# Patient Record
Sex: Female | Born: 1965
Health system: Southern US, Community
[De-identification: ages and names within clinical notes are randomized; demographics above are authoritative.]

## PROBLEM LIST (undated history)

## (undated) DIAGNOSIS — J40 Bronchitis, not specified as acute or chronic: Secondary | ICD-10-CM

## (undated) DIAGNOSIS — I272 Pulmonary hypertension, unspecified: Secondary | ICD-10-CM

## (undated) DIAGNOSIS — I3139 Other pericardial effusion (noninflammatory): Secondary | ICD-10-CM

## (undated) DIAGNOSIS — I313 Pericardial effusion (noninflammatory): Secondary | ICD-10-CM

## (undated) HISTORY — DX: Pericardial effusion (noninflammatory): I31.3

## (undated) HISTORY — PX: EYE SURGERY: SHX253

## (undated) HISTORY — PX: ABDOMINAL HYSTERECTOMY: SHX81

## (undated) HISTORY — DX: Other pericardial effusion (noninflammatory): I31.39

## (undated) HISTORY — DX: Pulmonary hypertension, unspecified: I27.20

---

## 2001-08-01 ENCOUNTER — Encounter: Payer: Self-pay | Admitting: General Surgery

## 2001-08-01 ENCOUNTER — Observation Stay (HOSPITAL_COMMUNITY): Admission: EM | Admit: 2001-08-01 | Discharge: 2001-08-01 | Payer: Self-pay

## 2001-10-11 ENCOUNTER — Emergency Department (HOSPITAL_COMMUNITY): Admission: EM | Admit: 2001-10-11 | Discharge: 2001-10-11 | Payer: Self-pay | Admitting: Emergency Medicine

## 2004-11-29 ENCOUNTER — Inpatient Hospital Stay (HOSPITAL_COMMUNITY): Admission: AD | Admit: 2004-11-29 | Discharge: 2004-12-08 | Payer: Self-pay | Admitting: Obstetrics and Gynecology

## 2004-11-29 ENCOUNTER — Encounter: Payer: Self-pay | Admitting: Emergency Medicine

## 2004-11-29 ENCOUNTER — Ambulatory Visit: Payer: Self-pay | Admitting: Infectious Diseases

## 2004-11-30 ENCOUNTER — Encounter (INDEPENDENT_AMBULATORY_CARE_PROVIDER_SITE_OTHER): Payer: Self-pay | Admitting: Specialist

## 2004-12-04 ENCOUNTER — Encounter (INDEPENDENT_AMBULATORY_CARE_PROVIDER_SITE_OTHER): Payer: Self-pay | Admitting: *Deleted

## 2005-11-13 ENCOUNTER — Emergency Department (HOSPITAL_COMMUNITY): Admission: EM | Admit: 2005-11-13 | Discharge: 2005-11-13 | Payer: Self-pay | Admitting: Emergency Medicine

## 2006-07-30 ENCOUNTER — Emergency Department (HOSPITAL_COMMUNITY): Admission: EM | Admit: 2006-07-30 | Discharge: 2006-07-30 | Payer: Self-pay | Admitting: Emergency Medicine

## 2007-02-13 ENCOUNTER — Inpatient Hospital Stay (HOSPITAL_COMMUNITY): Admission: AD | Admit: 2007-02-13 | Discharge: 2007-02-13 | Payer: Self-pay | Admitting: Gynecology

## 2008-03-21 ENCOUNTER — Ambulatory Visit: Payer: Self-pay | Admitting: Critical Care Medicine

## 2008-03-21 ENCOUNTER — Inpatient Hospital Stay (HOSPITAL_COMMUNITY): Admission: EM | Admit: 2008-03-21 | Discharge: 2008-03-27 | Payer: Self-pay | Admitting: Emergency Medicine

## 2008-03-21 ENCOUNTER — Ambulatory Visit: Payer: Self-pay | Admitting: Cardiology

## 2008-03-23 ENCOUNTER — Encounter: Payer: Self-pay | Admitting: Internal Medicine

## 2008-04-05 ENCOUNTER — Emergency Department (HOSPITAL_COMMUNITY): Admission: EM | Admit: 2008-04-05 | Discharge: 2008-04-05 | Payer: Self-pay | Admitting: Emergency Medicine

## 2008-04-12 ENCOUNTER — Emergency Department (HOSPITAL_COMMUNITY): Admission: EM | Admit: 2008-04-12 | Discharge: 2008-04-12 | Payer: Self-pay | Admitting: Family Medicine

## 2008-05-23 ENCOUNTER — Emergency Department (HOSPITAL_COMMUNITY): Admission: EM | Admit: 2008-05-23 | Discharge: 2008-05-24 | Payer: Self-pay | Admitting: Emergency Medicine

## 2008-06-13 ENCOUNTER — Emergency Department (HOSPITAL_COMMUNITY): Admission: EM | Admit: 2008-06-13 | Discharge: 2008-06-13 | Payer: Self-pay | Admitting: Family Medicine

## 2010-04-18 ENCOUNTER — Emergency Department (HOSPITAL_COMMUNITY): Admission: EM | Admit: 2010-04-18 | Discharge: 2010-04-19 | Payer: Self-pay | Admitting: Emergency Medicine

## 2010-06-23 ENCOUNTER — Emergency Department (HOSPITAL_COMMUNITY): Admission: EM | Admit: 2010-06-23 | Discharge: 2010-06-23 | Payer: Self-pay | Admitting: Emergency Medicine

## 2010-09-10 ENCOUNTER — Inpatient Hospital Stay (HOSPITAL_COMMUNITY)
Admission: EM | Admit: 2010-09-10 | Discharge: 2010-10-13 | Payer: Self-pay | Source: Home / Self Care | Attending: Pulmonary Disease | Admitting: Pulmonary Disease

## 2010-09-10 ENCOUNTER — Ambulatory Visit: Payer: Self-pay | Admitting: Pulmonary Disease

## 2010-09-29 ENCOUNTER — Encounter: Payer: Self-pay | Admitting: Pulmonary Disease

## 2010-10-23 ENCOUNTER — Ambulatory Visit: Payer: Self-pay | Admitting: Adult Health

## 2010-10-23 DIAGNOSIS — J449 Chronic obstructive pulmonary disease, unspecified: Secondary | ICD-10-CM

## 2010-10-23 DIAGNOSIS — R404 Transient alteration of awareness: Secondary | ICD-10-CM

## 2010-10-23 DIAGNOSIS — J9600 Acute respiratory failure, unspecified whether with hypoxia or hypercapnia: Secondary | ICD-10-CM

## 2010-10-23 DIAGNOSIS — E46 Unspecified protein-calorie malnutrition: Secondary | ICD-10-CM | POA: Insufficient documentation

## 2010-10-23 DIAGNOSIS — J441 Chronic obstructive pulmonary disease with (acute) exacerbation: Secondary | ICD-10-CM | POA: Insufficient documentation

## 2010-10-23 DIAGNOSIS — D649 Anemia, unspecified: Secondary | ICD-10-CM | POA: Insufficient documentation

## 2010-10-23 DIAGNOSIS — J189 Pneumonia, unspecified organism: Secondary | ICD-10-CM | POA: Insufficient documentation

## 2010-10-23 DIAGNOSIS — L8990 Pressure ulcer of unspecified site, unspecified stage: Secondary | ICD-10-CM | POA: Insufficient documentation

## 2010-11-06 ENCOUNTER — Ambulatory Visit
Admission: RE | Admit: 2010-11-06 | Discharge: 2010-11-06 | Payer: Self-pay | Source: Home / Self Care | Attending: Internal Medicine | Admitting: Internal Medicine

## 2010-11-06 ENCOUNTER — Ambulatory Visit: Admission: RE | Admit: 2010-11-06 | Discharge: 2010-11-06 | Payer: Self-pay | Source: Home / Self Care

## 2010-11-27 ENCOUNTER — Ambulatory Visit: Admit: 2010-11-27 | Payer: Self-pay | Admitting: Internal Medicine

## 2010-11-27 ENCOUNTER — Ambulatory Visit (INDEPENDENT_AMBULATORY_CARE_PROVIDER_SITE_OTHER): Payer: Self-pay | Admitting: Internal Medicine

## 2010-11-27 ENCOUNTER — Encounter (INDEPENDENT_AMBULATORY_CARE_PROVIDER_SITE_OTHER): Payer: Self-pay

## 2010-11-27 ENCOUNTER — Encounter: Payer: Self-pay | Admitting: Internal Medicine

## 2010-11-27 DIAGNOSIS — J9600 Acute respiratory failure, unspecified whether with hypoxia or hypercapnia: Secondary | ICD-10-CM

## 2010-11-27 DIAGNOSIS — R635 Abnormal weight gain: Secondary | ICD-10-CM

## 2010-11-27 DIAGNOSIS — J449 Chronic obstructive pulmonary disease, unspecified: Secondary | ICD-10-CM

## 2010-11-27 DIAGNOSIS — J189 Pneumonia, unspecified organism: Secondary | ICD-10-CM

## 2010-11-27 NOTE — Assessment & Plan Note (Signed)
Summary: NP follow up - post hosp   CC:  post hosp follow up - states breathing is doing well.  no new complaints..  History of Present Illness: 45 yo female  with initial CCM admission on  11/16/2011for Acute respiratory failure with subsequent  tracheostomy. Admitted with respiratory distress , active smoker and cocaine use , positive hx of ETOH  2010/11/29 --Pt presents for a  post hospital visit. Admitted  09/10/2010--  10/13/2010 for Acute respiratory failure with tracheostomy and chronic obstructive  pulmonary disease., Agitation and severe delirium,  Pneumonia, ventilator-associated.  Admitted in respiratory distress , active smoker and cocaine use , positive hx of ETOH  She did require tracheostomy 12/6-12/17  She was tx w/ IV abx, and aggressive pulmonary hygiene.She did have severe Agitation and  delirium most likely secondary to drug withdrawal syndrome.  Her agitation was improved on addition  Seroquel  and clorazepate.   She has tapered off klonopin and remains on seroquel -which she believes is helping with her nerves/sleep. She says she has smoked only a few cigs since discharge and none for 2 weeks and no drugs  since discharge.   She is feeling much better since discharge and trach site is healed up. No cough or dypsnea.Denies chest pain, orthopnea, hemoptysis, fever, n/v/d, edema, headache.     Medications Prior to Update: 1)  Lorazepam 1 Mg Tabs (Lorazepam) .... Take 1 Tab By Mouth At Bedtime 2)  Seroquel 100 Mg Tabs (Quetiapine Fumarate) .... Take 1 Tab By Mouth At Bedtime  Current Medications (verified): 1)  Lorazepam 1 Mg Tabs (Lorazepam) .... Take 1 Tab By Mouth At Bedtime 2)  Seroquel 100 Mg Tabs (Quetiapine Fumarate) .... Take 1 Tab By Mouth At Bedtime  Allergies (verified): No Known Drug Allergies  Past History:  Past Surgical History: Last updated: 10/23/2010 hysterectomy  Family History: Last updated: 2010/11/29 asthma - brother (deceased) heart disease -  father w/ HTN, PGF, brother passed from AMI cancer - father (prostate) DM - father Alzheimer's - PGM multplie sclerosis - mother  Social History: Last updated: 2010-11-29 + cocaine abuse positive smoker - x39yrs, 1ppd The patient lives with her dad.    Drinks 1 beer per day. single 1 child disabled declines flu shot Nov 29, 2010  Past Medical History:  ANEMIA (ICD-285.9) MALNUTRITION (ICD-263.9) PRESSURE ULCER UNSPECIFIED SITE (ICD-707.00) PNEUMONIA (ICD-486) DELIRIUM (ICD-780.09) COPD (ICD-496) RESPIRATORY FAILURE, ACUTE (ICD-518.81) Hx of Cocaine /ETOH use     Family History: asthma - brother (deceased) heart disease - father w/ HTN, PGF, brother passed from AMI cancer - father (prostate) DM - father Alzheimer's - PGM multplie sclerosis - mother  Social History: + cocaine abuse positive smoker - x24yrs, 1ppd The patient lives with her dad.    Drinks 1 beer per day. single 1 child disabled declines flu shot Nov 29, 2010  Review of Systems      See HPI  Vital Signs:  Patient profile:   45 year old female Height:      62 inches Weight:      185 pounds BMI:     33.96 O2 Sat:      98 % on Room air Temp:     97.0 degrees F oral Pulse rate:   100 / minute BP sitting:   138 / 76  (left arm) Cuff size:   regular  Vitals Entered By: Boone Master CNA/MA (29-Nov-2010 11:20 AM)  O2 Flow:  Room air CC: post hosp follow up - states breathing is  doing well.  no new complaints. Is Patient Diabetic? No Comments Medications reviewed with patient Daytime contact number verified with patient. Boone Master CNA/MA  November 06, 2010 11:20 AM    Physical Exam  Additional Exam:  GEN: A/Ox3; pleasant , NAD HEENT:  [place]/AT, , EACs-clear, TMs-wnl, NOSE-clear, THROAT-clear NECK:  Supple w/ fair ROM; no JVD; normal carotid impulses w/o bruits; no thyromegaly or nodules palpated; no lymphadenopathy., trach site is healed  RESP  Clear to P & A; w/o, wheezes/ rales/ or  rhonchi. CARD:  RRR, no m/r/g   GI:   Soft & nt; nml bowel sounds; no organomegaly or masses detected. [place]co: Warm bil,  no calf tenderness edema, clubbing, pulses intact Neuro: intact w/ no focal deficits noted.    Impression & Recommendations:  Problem # 1:  RESPIRATORY FAILURE, ACUTE (ICD-518.81)  Recent prolonged hospitalization for  Acute respiratory failure with subsequent  tracheostomy. Admitted with respiratory distress , active smoker and cocaine use , positive hx of ETOH She is much improved, trach site is healed.  xray shows improvement w/resolution of PNA Plan:  follow up for PFT and follow up with Dr. Sherene Sires in 3-4 weeks  Please contact office for sooner follow up if symptoms do not improve or worsen   Orders: Est. Patient Level IV (04540)  Problem # 2:  DELIRIUM (ICD-780.09)  Hx of delirium associated with critical illness and drug withdrawl syndrome.  she is much improved , has tapered off klonopin have suggested decreasing seroquel , however she would like to stay oncurrent dose for now until ov back in office Plan  refill x 1 month only then reevauate   Orders: Est. Patient Level IV (98119)  Other Orders: T-2 View CXR (71020TC)  Patient Instructions: 1)  follow up for PFT and follow up with Dr. Sherene Sires in 3-4 weeks  2)  Xray today ,I will call with results.  3)  May use Seroquel at bedtime  4)  Please contact office for sooner follow up if symptoms do not improve or worsen  Prescriptions: SEROQUEL 100 MG TABS (QUETIAPINE FUMARATE) Take 1 tab by mouth at bedtime  #30 x 0   Entered and Authorized by:   Rubye Oaks NP   Signed by:   Rubye Oaks NP on 11/06/2010   Method used:   Electronically to        [facility] [phone]* (retail)       [address]       [place], [place]  [address]       Ph: [phone]       Fax: [phone]   RxID:   [id number]

## 2010-12-02 ENCOUNTER — Other Ambulatory Visit: Payer: Self-pay

## 2010-12-03 NOTE — Assessment & Plan Note (Signed)
Summary: Pulmonary/ summary f/u ov   Vital Signs:  Patient profile:   45 year old female Weight:      180 pounds O2 Sat:      94 % on Room air Temp:     98.0 degrees F oral Pulse rate:   108 / minute BP sitting:   124 / 82  (left arm)  Vitals Entered By: Vernie Murders (November 27, 2010 11:04 AM)  O2 Flow:  Room air  Primary Provider/Referring Provider:  none  CC:  Dyspnea- the same.  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  Admitted with respiratory distress, active smoker and cocaine use , positive hx of ETOH  11/06/10 --Pt presents for a  post hospital visit. Admitted  09/10/2010--  10/13/2010 for Acute respiratory failure with tracheostomy and chronic obstructive  pulmonary disease., Agitation and severe delirium,  Pneumonia, ventilator-associated.  Admitted in respiratory distress , active smoker and cocaine use , positive hx of ETOH  She did require tracheostomy 12/6-12/17  She was tx w/ IV abx, and aggressive pulmonary hygiene.She did have severe Agitation and  delirium most likely secondary to drug withdrawal syndrome.  Her agitation was improved on addition  Seroquel  and clorazepate.   She has tapered off klonopin and remains on seroquel -which she believes is helping with her nerves/sleep. She says she has smoked only a few cigs since discharge and none for 2 weeks and no drugs  since discharge.     November 27, 2010 ov cc sob  still smoking some but gets across parking lot ok,  one landing ok, sleeping ok. really afraid to push it harder at this point.  Pt denies any significant sore throat, dysphagia, itching, sneezing,  nasal congestion or excess secretions,  fever, chills, sweats, unintended wt loss, pleuritic or exertional cp, hempoptysis, change in activity tolerance  orthopnea pnd or leg swelling   denies any cocaine or alcohol use   Current  Medications (verified): 1)  Lorazepam 1 Mg Tabs (Lorazepam) .... Take 1 Tab By Mouth At [facility]  Allergies (verified): No Known Drug Allergies  Past History:  Past Medical History: ANEMIA (ICD-285.9) MALNUTRITION (ICD-263.9) PRESSURE ULCER UNSPECIFIED SITE (ICD-707.00) PNEUMONIA (ICD-486) DELIRIUM (ICD-780.09)  RESPIRATORY FAILURE, ACUTE (ICD-518.81)     - Probably related to  cocaine inhalation     - PFT's November 27, 2010 FEV1  1.99 (78%) ratio 77 and DLC0 50 > corrects to 77 Hx of Cocaine /ETOH use      Impression & Recommendations:  Problem # 1:  RESPIRATORY FAILURE, ACUTE (ICD-518.81)  Resolved with excellent baseline function  Orders: Est. Patient Level II (04540)  Problem # 2:  WEIGHT GAIN, ABNORMAL (ICD-783.1)  Weight control is a matter of calorie balance which needs to be tilted in the pt's favor by eating less and exercising more.  Specifically, I recommended  exercise at a level where pt  is short of breath but not out of breath 30 minutes daily.  If not losing weight on this program, I would strongly recommend pt see a nutritionist with a food diary recorded for two weeks prior to the visit.     Orders: Est. Patient Level II (98119)  Medications Added to Medication List This Visit: 1)  Lorazepam 1 Mg Tabs (Lorazepam) .... Take 1 tab by mouth at bedtime-ran out  Patient Instructions: 1)  Resume activities  avoid heavy exposure to extremes  of temperature or smoke or fumes.  2)  Weight control is simply a matter of calorie balance which needs to be tilted in your favor by eating less and exercising more.  To get the most out of exercise, you need to be continuously aware that you are short of breath, but never out of breath, for 30 minutes daily. As you improve, it will actually be easier for you to do the same amount in  30 minutes so always push to the level where you are short of breath.  If this does not result in gradual weight reduction,  I recommend   a nutritionist for a food diary   Orders Added: 1)  Est. Patient Level II [04540]

## 2010-12-03 NOTE — Assessment & Plan Note (Signed)
Summary: pft w/ rov--pft charges//jwr   Allergies: No Known Drug Allergies   Other Orders: Carbon Monoxide diffusing w/capacity (16109) Lung Volumes/Gas dilution or washout (60454) Spirometry (Pre & Post) (09811)   Orders Added: 1)  Carbon Monoxide diffusing w/capacity [94729] 2)  Lung Volumes/Gas dilution or washout [94727] 3)  Spirometry (Pre & Post) [94060]

## 2010-12-24 NOTE — Op Note (Signed)
Michele Parrish, Michele Parrish              ACCOUNT NO.:  [id number]  MEDICAL RECORD NO.:  [id number]          PATIENT TYPE:  INP  LOCATION:  1228                         FACILITY:  [facility]  PHYSICIAN:  Kalman Shan, MD   DATE OF BIRTH:  Apr 06, 1966  DATE OF PROCEDURE: DATE OF DISCHARGE:                              OPERATIVE REPORT   TYPE OF PROCEDURE:  Percutaneous dilatational tracheostomy with fiberoptic bronchoscopy.  SURGEONS: 1. Kalman Shan, MD. 2. Mcarthur Rossetti. Tyson Alias, MD, will be the bronchoscopist and the proctor     for the percutaneous dilatational tracheostomy.  CONSENT:  Informed consent with the risks and benefits explained to the father who was the closest of kin obtained on the day prior to procedure, which was September 24, 2010.  INDICATIONS FOR PROCEDURE: 1. Acute-on-chronic respiratory failure, failure to wean from     mechanical ventilator. 2. Failure to wean from mechanical ventilator. 3. Prolonged mechanical ventilation. 4. Intensive care unit delirium. 5. Dependence on respiratory status.  RISKS OF PROCEDURE:  Including bleeding, infection, tracheal decannulation, hemorrhage, tracheal stenosis, recurrent bronchitis, and tracheomalacia and complications with sedation all explained to the father, and he agreed to proceed.  SITE OF PROCEDURE:  [facility].  PREPROCEDURE ASSESSMENT:  [name] with acute-on-chronic respiratory failure, dependence on respiratory status, failure to wean from mechanical ventilator secondary to pneumonia and also delirium. She is almost 2 weeks on the ventilator, and therefore it was felt tracheostomy was indicated.  Prior to procedure, normal vital signs and blood pressure and physical exam, which was acceptable.  Chemistry labs, hematology labs, and coagulation profile was confirmed and it was decided it was safe to undergo procedure.  SEDATION PLANNED:  Fentanyl and Versed followed by  etomidate with vecuronium.  DESCRIPTION OF PROCEDURE:  Prior to procedure, the patient was given FIO2 100% for at least an hour before the procedure.  The patient was initially sedated with fentanyl and Versed.  A blanket roll was placed behind her shoulders to extend the neck.  Following this, the landmarks of the thyroid cartilage, cricoid cartilage, and tracheal rings in the suprasternal notch were all identified and marked.  Then, after a full body drape by the operator, her neck was cleaned with chlorhexidine x3 scrubs.  Following this, topical lidocaine anesthesia was given in the area of the tracheal rings.  At this point, etomidate was infused to obtain general anesthesia.  Following this, furthermore, fentanyl and Versed was given and vecuronium was given.  Blood pressure remained normal intitially, but the mean arterial pressure did drop to 60 and 1 saline bolus was given.  Then, after ensuring adequate analgesia and anesthesia, a longitudinal skin incision 2 cm long was made in the center between the suprasternal notch and the cricoid cartilage.  The subcutaneous plane was dissected.  The cricoid cartilage and the thyroid cartilages were palpated for landmarks, and so were the tracheal rings.  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 At this point, the Angiocath needle was carefully introduced and we penetrated the space between the 2nd and 3rd tracheal rings and the Angiocath then penetrated the tracheal airspace.  This was noticed with return of air bubbles and also with the bronchoscopist confirming the presence of the Angiocath.  Extreme care was taken to ensure that the posterior membranous wall was not penetrated with the Angiocath  and following this, the guidewire was introduced and following this, the punch dilator was introduced with dilatation of the tracheal ring. After this, the punch dilator was removed and the Blue Rhino dilator under lubrication was used to further dilate the space between the tracheal rings.  The tracheostomy was then taken and with 26 French dilator inside was snapped into place.  Following this, the white inner sheath and the dilator were removed.  The tracheostomy was kept in place and the inner cannula was snapped in place.  After this, the bronchoscopist removed the bronchoscope from the endotracheal tube and passed through the tracheostomy in excellent position and no bleeding was ensured.  After this, the bronchoscope was withdrawn and the tracheostomy was sutured and a Velcro strap was applied.  COMPLICATIONS:  None.  POSTPROCEDURE PLAN:  Await chest x-ray, postprocedure tracheostomy order set filled out.  SEDATION PLAN:  Will be written out, but will discontinue the Precedex.     Kalman Shan, MD     MR/MEDQ  D:  09/25/2010  T:  09/25/2010  Job:  [id number]  Electronically Signed by Kalman Shan MD on 12/24/2010 11:01:45 AM

## 2011-01-05 LAB — BASIC METABOLIC PANEL
BUN: 17 mg/dL (ref 6–23)
BUN: 11 mg/dL (ref 6–23)
BUN: 13 mg/dL (ref 6–23)
BUN: 15 mg/dL (ref 6–23)
BUN: 17 mg/dL (ref 6–23)
BUN: 19 mg/dL (ref 6–23)
BUN: 7 mg/dL (ref 6–23)
BUN: 8 mg/dL (ref 6–23)
CO2: 30 mEq/L (ref 19–32)
CO2: 24 mEq/L (ref 19–32)
CO2: 25 mEq/L (ref 19–32)
CO2: 25 mEq/L (ref 19–32)
CO2: 27 mEq/L (ref 19–32)
CO2: 30 mEq/L (ref 19–32)
CO2: 31 mEq/L (ref 19–32)
CO2: 31 mEq/L (ref 19–32)
CO2: 33 mEq/L — ABNORMAL HIGH (ref 19–32)
Calcium: 8.6 mg/dL (ref 8.4–10.5)
Calcium: 9 mg/dL (ref 8.4–10.5)
Calcium: 9 mg/dL (ref 8.4–10.5)
Calcium: 9 mg/dL (ref 8.4–10.5)
Calcium: 9.2 mg/dL (ref 8.4–10.5)
Calcium: 9.2 mg/dL (ref 8.4–10.5)
Chloride: 105 mEq/L (ref 96–112)
Chloride: 100 mEq/L (ref 96–112)
Chloride: 101 mEq/L (ref 96–112)
Chloride: 101 mEq/L (ref 96–112)
Chloride: 104 mEq/L (ref 96–112)
Chloride: 109 mEq/L (ref 96–112)
Chloride: 109 mEq/L (ref 96–112)
Chloride: 110 mEq/L (ref 96–112)
Creatinine, Ser: 0.54 mg/dL (ref 0.4–1.2)
Creatinine, Ser: 0.73 mg/dL (ref 0.4–1.2)
Creatinine, Ser: 0.77 mg/dL (ref 0.4–1.2)
Creatinine, Ser: 0.8 mg/dL (ref 0.4–1.2)
Creatinine, Ser: 0.81 mg/dL (ref 0.4–1.2)
Creatinine, Ser: 0.88 mg/dL (ref 0.4–1.2)
GFR calc Af Amer: >60 mL/min (ref >60)
GFR calc Af Amer: >60 mL/min (ref >60)
GFR calc Af Amer: >60 mL/min (ref >60)
GFR calc Af Amer: >60 mL/min (ref >60)
GFR calc non Af Amer: >60 mL/min (ref >60)
GFR calc non Af Amer: >60 mL/min (ref >60)
GFR calc non Af Amer: >60 mL/min (ref >60)
GFR calc non Af Amer: >60 mL/min (ref >60)
GFR calc non Af Amer: >60 mL/min (ref >60)
GFR calc non Af Amer: >60 mL/min (ref >60)
GFR calc non Af Amer: >60 mL/min (ref >60)
Glucose, Bld: 111 mg/dL — ABNORMAL HIGH (ref 70–99)
Glucose, Bld: 118 mg/dL — ABNORMAL HIGH (ref 70–99)
Glucose, Bld: 119 mg/dL — ABNORMAL HIGH (ref 70–99)
Glucose, Bld: 128 mg/dL — ABNORMAL HIGH (ref 70–99)
Glucose, Bld: 149 mg/dL — ABNORMAL HIGH (ref 70–99)
Glucose, Bld: 161 mg/dL — ABNORMAL HIGH (ref 70–99)
Glucose, Bld: 98 mg/dL (ref 70–99)
Potassium: 4.1 mEq/L (ref 3.5–5.1)
Potassium: 3.6 mEq/L (ref 3.5–5.1)
Potassium: 4 mEq/L (ref 3.5–5.1)
Potassium: 4.1 mEq/L (ref 3.5–5.1)
Potassium: 4.3 mEq/L (ref 3.5–5.1)
Potassium: 4.6 mEq/L (ref 3.5–5.1)
Sodium: 146 mEq/L — ABNORMAL HIGH (ref 135–145)
Sodium: 139 mEq/L (ref 135–145)
Sodium: 140 mEq/L (ref 135–145)
Sodium: 140 mEq/L (ref 135–145)
Sodium: 140 mEq/L (ref 135–145)
Sodium: 142 mEq/L (ref 135–145)
Sodium: 143 mEq/L (ref 135–145)
Sodium: 143 mEq/L (ref 135–145)

## 2011-01-05 LAB — CBC
HCT: 32.7 % — ABNORMAL LOW (ref 36.0–46.0)
HCT: 27 % — ABNORMAL LOW (ref 36.0–46.0)
HCT: 27.1 % — ABNORMAL LOW (ref 36.0–46.0)
HCT: 28.5 % — ABNORMAL LOW (ref 36.0–46.0)
HCT: 29.2 % — ABNORMAL LOW (ref 36.0–46.0)
HCT: 30.3 % — ABNORMAL LOW (ref 36.0–46.0)
HCT: 31.2 % — ABNORMAL LOW (ref 36.0–46.0)
HCT: 31.2 % — ABNORMAL LOW (ref 36.0–46.0)
HCT: 32 % — ABNORMAL LOW (ref 36.0–46.0)
HCT: 32.1 % — ABNORMAL LOW (ref 36.0–46.0)
Hemoglobin: 10 g/dL — ABNORMAL LOW (ref 12.0–15.0)
Hemoglobin: 10.1 g/dL — ABNORMAL LOW (ref 12.0–15.0)
Hemoglobin: 10.1 g/dL — ABNORMAL LOW (ref 12.0–15.0)
Hemoglobin: 10.4 g/dL — ABNORMAL LOW (ref 12.0–15.0)
Hemoglobin: 9.3 g/dL — ABNORMAL LOW (ref 12.0–15.0)
Hemoglobin: 9.7 g/dL — ABNORMAL LOW (ref 12.0–15.0)
Hemoglobin: 9.8 g/dL — ABNORMAL LOW (ref 12.0–15.0)
MCH: 27.2 pg (ref 26.0–34.0)
MCH: 27.2 pg (ref 26.0–34.0)
MCH: 27.2 pg (ref 26.0–34.0)
MCH: 27.5 pg (ref 26.0–34.0)
MCH: 27.5 pg (ref 26.0–34.0)
MCH: 27.6 pg (ref 26.0–34.0)
MCH: 27.7 pg (ref 26.0–34.0)
MCHC: 31.4 g/dL (ref 30.0–36.0)
MCHC: 32.1 g/dL (ref 30.0–36.0)
MCHC: 32.6 g/dL (ref 30.0–36.0)
MCHC: 32.8 g/dL (ref 30.0–36.0)
MCV: 83.3 fL (ref 78.0–100.0)
MCV: 84.2 fL (ref 78.0–100.0)
MCV: 84.2 fL (ref 78.0–100.0)
MCV: 84.4 fL (ref 78.0–100.0)
MCV: 84.4 fL (ref 78.0–100.0)
MCV: 86.7 fL (ref 78.0–100.0)
MCV: 86.8 fL (ref 78.0–100.0)
MCV: 87 fL (ref 78.0–100.0)
MCV: 87.2 fL (ref 78.0–100.0)
Platelets: 118 10*3/uL — ABNORMAL LOW (ref 150–400)
Platelets: 245 10*3/uL (ref 150–400)
Platelets: 276 10*3/uL (ref 150–400)
Platelets: 283 10*3/uL (ref 150–400)
Platelets: 283 10*3/uL (ref 150–400)
Platelets: 291 10*3/uL (ref 150–400)
RBC: 3.81 MIL/uL — ABNORMAL LOW (ref 3.87–5.11)
RBC: 3.42 MIL/uL — ABNORMAL LOW (ref 3.87–5.11)
RBC: 3.46 MIL/uL — ABNORMAL LOW (ref 3.87–5.11)
RBC: 3.6 MIL/uL — ABNORMAL LOW (ref 3.87–5.11)
RBC: 3.68 MIL/uL — ABNORMAL LOW (ref 3.87–5.11)
RBC: 3.71 MIL/uL — ABNORMAL LOW (ref 3.87–5.11)
RBC: 3.72 MIL/uL — ABNORMAL LOW (ref 3.87–5.11)
RBC: 3.76 MIL/uL — ABNORMAL LOW (ref 3.87–5.11)
RDW: 14.4 % (ref 11.5–15.5)
RDW: 13.8 % (ref 11.5–15.5)
RDW: 13.8 % (ref 11.5–15.5)
RDW: 14 % (ref 11.5–15.5)
RDW: 14.3 % (ref 11.5–15.5)
RDW: 14.3 % (ref 11.5–15.5)
RDW: 14.3 % (ref 11.5–15.5)
WBC: 6.4 10*3/uL (ref 4.0–10.5)
WBC: 10.5 10*3/uL (ref 4.0–10.5)
WBC: 11.2 10*3/uL — ABNORMAL HIGH (ref 4.0–10.5)
WBC: 13.9 10*3/uL — ABNORMAL HIGH (ref 4.0–10.5)
WBC: 16 10*3/uL — ABNORMAL HIGH (ref 4.0–10.5)
WBC: 7.1 10*3/uL (ref 4.0–10.5)
WBC: 7.8 10*3/uL (ref 4.0–10.5)
WBC: 8.3 10*3/uL (ref 4.0–10.5)
WBC: 8.5 10*3/uL (ref 4.0–10.5)
WBC: 9.4 10*3/uL (ref 4.0–10.5)

## 2011-01-05 LAB — COMPREHENSIVE METABOLIC PANEL
ALT: 15 U/L (ref 0–35)
ALT: 17 U/L (ref 0–35)
ALT: 22 U/L (ref 0–35)
AST: 21 U/L (ref 0–37)
AST: 25 U/L (ref 0–37)
Albumin: 2.8 g/dL — ABNORMAL LOW (ref 3.5–5.2)
Albumin: 2.6 g/dL — ABNORMAL LOW (ref 3.5–5.2)
Albumin: 2.6 g/dL — ABNORMAL LOW (ref 3.5–5.2)
Albumin: 2.7 g/dL — ABNORMAL LOW (ref 3.5–5.2)
Alkaline Phosphatase: 48 U/L (ref 39–117)
Alkaline Phosphatase: 46 U/L (ref 39–117)
Alkaline Phosphatase: 47 U/L (ref 39–117)
Alkaline Phosphatase: 47 U/L (ref 39–117)
Alkaline Phosphatase: 47 U/L (ref 39–117)
BUN: 15 mg/dL (ref 6–23)
BUN: 18 mg/dL (ref 6–23)
BUN: 20 mg/dL (ref 6–23)
CO2: 27 mEq/L (ref 19–32)
CO2: 28 mEq/L (ref 19–32)
Calcium: 8.9 mg/dL (ref 8.4–10.5)
Chloride: 102 mEq/L (ref 96–112)
Chloride: 103 mEq/L (ref 96–112)
Chloride: 105 mEq/L (ref 96–112)
Creatinine, Ser: 0.78 mg/dL (ref 0.4–1.2)
Creatinine, Ser: 0.84 mg/dL (ref 0.4–1.2)
Creatinine, Ser: 1 mg/dL (ref 0.4–1.2)
GFR calc Af Amer: >60 mL/min (ref >60)
GFR calc non Af Amer: >60 mL/min (ref >60)
GFR calc non Af Amer: >60 mL/min (ref >60)
Glucose, Bld: 100 mg/dL — ABNORMAL HIGH (ref 70–99)
Glucose, Bld: 121 mg/dL — ABNORMAL HIGH (ref 70–99)
Glucose, Bld: 122 mg/dL — ABNORMAL HIGH (ref 70–99)
Glucose, Bld: 123 mg/dL — ABNORMAL HIGH (ref 70–99)
Glucose, Bld: 135 mg/dL — ABNORMAL HIGH (ref 70–99)
Potassium: 3.4 mEq/L — ABNORMAL LOW (ref 3.5–5.1)
Potassium: 3.6 mEq/L (ref 3.5–5.1)
Potassium: 3.7 mEq/L (ref 3.5–5.1)
Potassium: 3.7 mEq/L (ref 3.5–5.1)
Potassium: 3.9 mEq/L (ref 3.5–5.1)
Sodium: 138 mEq/L (ref 135–145)
Sodium: 140 mEq/L (ref 135–145)
Sodium: 142 mEq/L (ref 135–145)
Sodium: 144 mEq/L (ref 135–145)
Total Bilirubin: 0.4 mg/dL (ref 0.3–1.2)
Total Bilirubin: 0.5 mg/dL (ref 0.3–1.2)
Total Bilirubin: 0.5 mg/dL (ref 0.3–1.2)
Total Protein: 7.5 g/dL (ref 6.0–8.3)
Total Protein: 7.8 g/dL (ref 6.0–8.3)
Total Protein: 8.8 g/dL — ABNORMAL HIGH (ref 6.0–8.3)

## 2011-01-05 LAB — URINALYSIS, ROUTINE W REFLEX MICROSCOPIC
Glucose, UA: NEGATIVE mg/dL
Protein, ur: 100 mg/dL — AB
Specific Gravity, Urine: 1.03 (ref 1.005–1.030)
pH: 5.5 (ref 5.0–8.0)

## 2011-01-05 LAB — GLUCOSE, CAPILLARY
Glucose-Capillary: 102 mg/dL — ABNORMAL HIGH (ref 70–99)
Glucose-Capillary: 103 mg/dL — ABNORMAL HIGH (ref 70–99)
Glucose-Capillary: 106 mg/dL — ABNORMAL HIGH (ref 70–99)
Glucose-Capillary: 111 mg/dL — ABNORMAL HIGH (ref 70–99)
Glucose-Capillary: 122 mg/dL — ABNORMAL HIGH (ref 70–99)
Glucose-Capillary: 128 mg/dL — ABNORMAL HIGH (ref 70–99)
Glucose-Capillary: 129 mg/dL — ABNORMAL HIGH (ref 70–99)
Glucose-Capillary: 130 mg/dL — ABNORMAL HIGH (ref 70–99)
Glucose-Capillary: 131 mg/dL — ABNORMAL HIGH (ref 70–99)
Glucose-Capillary: 133 mg/dL — ABNORMAL HIGH (ref 70–99)
Glucose-Capillary: 141 mg/dL — ABNORMAL HIGH (ref 70–99)
Glucose-Capillary: 88 mg/dL (ref 70–99)
Glucose-Capillary: 91 mg/dL (ref 70–99)
Glucose-Capillary: 96 mg/dL (ref 70–99)
Glucose-Capillary: 97 mg/dL (ref 70–99)
Glucose-Capillary: 98 mg/dL (ref 70–99)
Glucose-Capillary: 100 mg/dL — ABNORMAL HIGH (ref 70–99)
Glucose-Capillary: 101 mg/dL — ABNORMAL HIGH (ref 70–99)
Glucose-Capillary: 101 mg/dL — ABNORMAL HIGH (ref 70–99)
Glucose-Capillary: 102 mg/dL — ABNORMAL HIGH (ref 70–99)
Glucose-Capillary: 102 mg/dL — ABNORMAL HIGH (ref 70–99)
Glucose-Capillary: 104 mg/dL — ABNORMAL HIGH (ref 70–99)
Glucose-Capillary: 105 mg/dL — ABNORMAL HIGH (ref 70–99)
Glucose-Capillary: 108 mg/dL — ABNORMAL HIGH (ref 70–99)
Glucose-Capillary: 109 mg/dL — ABNORMAL HIGH (ref 70–99)
Glucose-Capillary: 110 mg/dL — ABNORMAL HIGH (ref 70–99)
Glucose-Capillary: 110 mg/dL — ABNORMAL HIGH (ref 70–99)
Glucose-Capillary: 111 mg/dL — ABNORMAL HIGH (ref 70–99)
Glucose-Capillary: 111 mg/dL — ABNORMAL HIGH (ref 70–99)
Glucose-Capillary: 111 mg/dL — ABNORMAL HIGH (ref 70–99)
Glucose-Capillary: 112 mg/dL — ABNORMAL HIGH (ref 70–99)
Glucose-Capillary: 114 mg/dL — ABNORMAL HIGH (ref 70–99)
Glucose-Capillary: 117 mg/dL — ABNORMAL HIGH (ref 70–99)
Glucose-Capillary: 117 mg/dL — ABNORMAL HIGH (ref 70–99)
Glucose-Capillary: 118 mg/dL — ABNORMAL HIGH (ref 70–99)
Glucose-Capillary: 119 mg/dL — ABNORMAL HIGH (ref 70–99)
Glucose-Capillary: 122 mg/dL — ABNORMAL HIGH (ref 70–99)
Glucose-Capillary: 122 mg/dL — ABNORMAL HIGH (ref 70–99)
Glucose-Capillary: 124 mg/dL — ABNORMAL HIGH (ref 70–99)
Glucose-Capillary: 124 mg/dL — ABNORMAL HIGH (ref 70–99)
Glucose-Capillary: 125 mg/dL — ABNORMAL HIGH (ref 70–99)
Glucose-Capillary: 128 mg/dL — ABNORMAL HIGH (ref 70–99)
Glucose-Capillary: 129 mg/dL — ABNORMAL HIGH (ref 70–99)
Glucose-Capillary: 130 mg/dL — ABNORMAL HIGH (ref 70–99)
Glucose-Capillary: 130 mg/dL — ABNORMAL HIGH (ref 70–99)
Glucose-Capillary: 130 mg/dL — ABNORMAL HIGH (ref 70–99)
Glucose-Capillary: 130 mg/dL — ABNORMAL HIGH (ref 70–99)
Glucose-Capillary: 134 mg/dL — ABNORMAL HIGH (ref 70–99)
Glucose-Capillary: 141 mg/dL — ABNORMAL HIGH (ref 70–99)
Glucose-Capillary: 143 mg/dL — ABNORMAL HIGH (ref 70–99)
Glucose-Capillary: 143 mg/dL — ABNORMAL HIGH (ref 70–99)
Glucose-Capillary: 144 mg/dL — ABNORMAL HIGH (ref 70–99)
Glucose-Capillary: 144 mg/dL — ABNORMAL HIGH (ref 70–99)
Glucose-Capillary: 146 mg/dL — ABNORMAL HIGH (ref 70–99)
Glucose-Capillary: 147 mg/dL — ABNORMAL HIGH (ref 70–99)
Glucose-Capillary: 157 mg/dL — ABNORMAL HIGH (ref 70–99)
Glucose-Capillary: 161 mg/dL — ABNORMAL HIGH (ref 70–99)
Glucose-Capillary: 164 mg/dL — ABNORMAL HIGH (ref 70–99)
Glucose-Capillary: 171 mg/dL — ABNORMAL HIGH (ref 70–99)
Glucose-Capillary: 91 mg/dL (ref 70–99)
Glucose-Capillary: 94 mg/dL (ref 70–99)
Glucose-Capillary: 98 mg/dL (ref 70–99)
Glucose-Capillary: 98 mg/dL (ref 70–99)
Glucose-Capillary: 99 mg/dL (ref 70–99)

## 2011-01-05 LAB — BLOOD GAS, ARTERIAL
Acid-Base Excess: 0.1 mmol/L (ref 0.0–2.0)
Bicarbonate: 24.1 mEq/L — ABNORMAL HIGH (ref 20.0–24.0)
Bicarbonate: 26.8 mEq/L — ABNORMAL HIGH (ref 20.0–24.0)
Drawn by: 295031
FIO2: 0.3 %
MECHVT: 450 mL
O2 Saturation: 87.9 %
Patient temperature: 98.6
TCO2: 21.9 mmol/L (ref 0–100)
TCO2: 24.8 mmol/L (ref 0–100)
pCO2 arterial: 38.8 mmHg (ref 35.0–45.0)
pCO2 arterial: 41.6 mmHg (ref 35.0–45.0)
pH, Arterial: 7.424 — ABNORMAL HIGH (ref 7.350–7.400)
pO2, Arterial: 53.6 mmHg — ABNORMAL LOW (ref 80.0–100.0)
pO2, Arterial: 59.9 mmHg — ABNORMAL LOW (ref 80.0–100.0)

## 2011-01-05 LAB — URINE CULTURE
Colony Count: NO GROWTH
Culture  Setup Time: 201112030053
Culture: NO GROWTH

## 2011-01-05 LAB — CULTURE, BLOOD (ROUTINE X 2)
Culture  Setup Time: 201112012339
Culture: NO GROWTH
Culture: NO GROWTH

## 2011-01-05 LAB — PROCALCITONIN: Procalcitonin: <0.1 ng/mL

## 2011-01-05 LAB — PROTIME-INR
INR: 1.1 (ref 0.00–1.49)
INR: 1.1 (ref 0.00–1.49)
Prothrombin Time: 14.4 seconds (ref 11.6–15.2)
Prothrombin Time: 14.4 seconds (ref 11.6–15.2)

## 2011-01-05 LAB — CULTURE, BAL-QUANTITATIVE

## 2011-01-05 LAB — MAGNESIUM
Magnesium: 2 mg/dL (ref 1.5–2.5)
Magnesium: 2.3 mg/dL (ref 1.5–2.5)

## 2011-01-05 LAB — URINE MICROSCOPIC-ADD ON

## 2011-01-05 LAB — BRAIN NATRIURETIC PEPTIDE: Pro B Natriuretic peptide (BNP): 65.4 pg/mL (ref 0.0–100.0)

## 2011-01-05 LAB — TRIGLYCERIDES
Triglycerides: 210 mg/dL — ABNORMAL HIGH (ref <150)
Triglycerides: 244 mg/dL — ABNORMAL HIGH (ref <150)
Triglycerides: 297 mg/dL — ABNORMAL HIGH (ref <150)

## 2011-01-05 LAB — CULTURE, BAL-QUANTITATIVE W GRAM STAIN: Colony Count: >=100000

## 2011-01-05 LAB — APTT: aPTT: 49 seconds — ABNORMAL HIGH (ref 24–37)

## 2011-01-05 LAB — PHOSPHORUS: Phosphorus: 5.6 mg/dL — ABNORMAL HIGH (ref 2.3–4.6)

## 2011-01-05 LAB — LACTIC ACID, PLASMA: Lactic Acid, Venous: 1.4 mmol/L (ref 0.5–2.2)

## 2011-01-06 LAB — CARDIAC PANEL(CRET KIN+CKTOT+MB+TROPI)
CK, MB: 5.8 ng/mL — ABNORMAL HIGH (ref 0.3–4.0)
CK, MB: 9.2 ng/mL (ref 0.3–4.0)
Relative Index: 0.9 (ref 0.0–2.5)
Relative Index: 0.9 (ref 0.0–2.5)
Total CK: 1487 U/L — ABNORMAL HIGH (ref 7–177)
Total CK: 834 U/L — ABNORMAL HIGH (ref 7–177)
Troponin I: 0.01 ng/mL (ref 0.00–0.06)
Troponin I: 0.02 ng/mL (ref 0.00–0.06)
Troponin I: 0.02 ng/mL (ref 0.00–0.06)

## 2011-01-06 LAB — GLUCOSE, CAPILLARY
Glucose-Capillary: 100 mg/dL — ABNORMAL HIGH (ref 70–99)
Glucose-Capillary: 101 mg/dL — ABNORMAL HIGH (ref 70–99)
Glucose-Capillary: 103 mg/dL — ABNORMAL HIGH (ref 70–99)
Glucose-Capillary: 104 mg/dL — ABNORMAL HIGH (ref 70–99)
Glucose-Capillary: 104 mg/dL — ABNORMAL HIGH (ref 70–99)
Glucose-Capillary: 105 mg/dL — ABNORMAL HIGH (ref 70–99)
Glucose-Capillary: 108 mg/dL — ABNORMAL HIGH (ref 70–99)
Glucose-Capillary: 110 mg/dL — ABNORMAL HIGH (ref 70–99)
Glucose-Capillary: 111 mg/dL — ABNORMAL HIGH (ref 70–99)
Glucose-Capillary: 111 mg/dL — ABNORMAL HIGH (ref 70–99)
Glucose-Capillary: 112 mg/dL — ABNORMAL HIGH (ref 70–99)
Glucose-Capillary: 112 mg/dL — ABNORMAL HIGH (ref 70–99)
Glucose-Capillary: 115 mg/dL — ABNORMAL HIGH (ref 70–99)
Glucose-Capillary: 116 mg/dL — ABNORMAL HIGH (ref 70–99)
Glucose-Capillary: 117 mg/dL — ABNORMAL HIGH (ref 70–99)
Glucose-Capillary: 117 mg/dL — ABNORMAL HIGH (ref 70–99)
Glucose-Capillary: 121 mg/dL — ABNORMAL HIGH (ref 70–99)
Glucose-Capillary: 126 mg/dL — ABNORMAL HIGH (ref 70–99)
Glucose-Capillary: 128 mg/dL — ABNORMAL HIGH (ref 70–99)
Glucose-Capillary: 132 mg/dL — ABNORMAL HIGH (ref 70–99)
Glucose-Capillary: 133 mg/dL — ABNORMAL HIGH (ref 70–99)
Glucose-Capillary: 136 mg/dL — ABNORMAL HIGH (ref 70–99)
Glucose-Capillary: 138 mg/dL — ABNORMAL HIGH (ref 70–99)
Glucose-Capillary: 139 mg/dL — ABNORMAL HIGH (ref 70–99)
Glucose-Capillary: 139 mg/dL — ABNORMAL HIGH (ref 70–99)
Glucose-Capillary: 139 mg/dL — ABNORMAL HIGH (ref 70–99)
Glucose-Capillary: 140 mg/dL — ABNORMAL HIGH (ref 70–99)
Glucose-Capillary: 140 mg/dL — ABNORMAL HIGH (ref 70–99)
Glucose-Capillary: 141 mg/dL — ABNORMAL HIGH (ref 70–99)
Glucose-Capillary: 142 mg/dL — ABNORMAL HIGH (ref 70–99)
Glucose-Capillary: 142 mg/dL — ABNORMAL HIGH (ref 70–99)
Glucose-Capillary: 143 mg/dL — ABNORMAL HIGH (ref 70–99)
Glucose-Capillary: 144 mg/dL — ABNORMAL HIGH (ref 70–99)
Glucose-Capillary: 150 mg/dL — ABNORMAL HIGH (ref 70–99)
Glucose-Capillary: 150 mg/dL — ABNORMAL HIGH (ref 70–99)
Glucose-Capillary: 152 mg/dL — ABNORMAL HIGH (ref 70–99)
Glucose-Capillary: 153 mg/dL — ABNORMAL HIGH (ref 70–99)
Glucose-Capillary: 154 mg/dL — ABNORMAL HIGH (ref 70–99)
Glucose-Capillary: 161 mg/dL — ABNORMAL HIGH (ref 70–99)
Glucose-Capillary: 162 mg/dL — ABNORMAL HIGH (ref 70–99)
Glucose-Capillary: 162 mg/dL — ABNORMAL HIGH (ref 70–99)
Glucose-Capillary: 168 mg/dL — ABNORMAL HIGH (ref 70–99)
Glucose-Capillary: 169 mg/dL — ABNORMAL HIGH (ref 70–99)
Glucose-Capillary: 169 mg/dL — ABNORMAL HIGH (ref 70–99)
Glucose-Capillary: 170 mg/dL — ABNORMAL HIGH (ref 70–99)
Glucose-Capillary: 177 mg/dL — ABNORMAL HIGH (ref 70–99)
Glucose-Capillary: 181 mg/dL — ABNORMAL HIGH (ref 70–99)
Glucose-Capillary: 193 mg/dL — ABNORMAL HIGH (ref 70–99)
Glucose-Capillary: 203 mg/dL — ABNORMAL HIGH (ref 70–99)
Glucose-Capillary: 206 mg/dL — ABNORMAL HIGH (ref 70–99)

## 2011-01-06 LAB — BLOOD GAS, ARTERIAL
Acid-Base Excess: 3 mmol/L — ABNORMAL HIGH (ref 0.0–2.0)
Acid-Base Excess: 3.6 mmol/L — ABNORMAL HIGH (ref 0.0–2.0)
Acid-Base Excess: 5.1 mmol/L — ABNORMAL HIGH (ref 0.0–2.0)
Acid-base deficit: 2.3 mmol/L — ABNORMAL HIGH (ref 0.0–2.0)
Acid-base deficit: 8.4 mmol/L — ABNORMAL HIGH (ref 0.0–2.0)
Acid-base deficit: 8.7 mmol/L — ABNORMAL HIGH (ref 0.0–2.0)
Bicarbonate: 17.6 mEq/L — ABNORMAL LOW (ref 20.0–24.0)
Bicarbonate: 19.2 mEq/L — ABNORMAL LOW (ref 20.0–24.0)
Bicarbonate: 23.2 mEq/L (ref 20.0–24.0)
Drawn by: 103701
Drawn by: 103701
Drawn by: 257701
Drawn by: 32630
Drawn by: 326301
FIO2: 0.4 %
FIO2: 0.4 %
FIO2: 0.6 %
FIO2: 0.6 %
FIO2: 1 %
MECHVT: 0.45 mL
MECHVT: 450 mL
MECHVT: 450 mL
MECHVT: 450 mL
O2 Saturation: 89.1 %
O2 Saturation: 92.9 %
O2 Saturation: 97.4 %
O2 Saturation: 97.7 %
PEEP: 5 cmH2O
PEEP: 5 cmH2O
PEEP: 5 cmH2O
PEEP: 5 cmH2O
PEEP: 5 cmH2O
Patient temperature: 98.6
Patient temperature: 98.6
Patient temperature: 98.6
Patient temperature: 98.6
RATE: 16 resp/min
RATE: 16 resp/min
RATE: 16 resp/min
RATE: 20 resp/min
TCO2: 18 mmol/L (ref 0–100)
TCO2: 21.5 mmol/L (ref 0–100)
TCO2: 25.1 mmol/L (ref 0–100)
TCO2: 27.9 mmol/L (ref 0–100)
pCO2 arterial: 40.8 mmHg (ref 35.0–45.0)
pCO2 arterial: 42.4 mmHg (ref 35.0–45.0)
pCO2 arterial: 48.5 mmHg — ABNORMAL HIGH (ref 35.0–45.0)
pH, Arterial: 7.221 — ABNORMAL LOW (ref 7.350–7.400)
pH, Arterial: 7.328 — ABNORMAL LOW (ref 7.350–7.400)
pO2, Arterial: 119 mmHg — ABNORMAL HIGH (ref 80.0–100.0)
pO2, Arterial: 58.9 mmHg — ABNORMAL LOW (ref 80.0–100.0)
pO2, Arterial: 72.8 mmHg — ABNORMAL LOW (ref 80.0–100.0)
pO2, Arterial: 92.6 mmHg (ref 80.0–100.0)

## 2011-01-06 LAB — CBC
HCT: 27.3 % — ABNORMAL LOW (ref 36.0–46.0)
HCT: 31.3 % — ABNORMAL LOW (ref 36.0–46.0)
HCT: 33.3 % — ABNORMAL LOW (ref 36.0–46.0)
HCT: 33.8 % — ABNORMAL LOW (ref 36.0–46.0)
HCT: 34.1 % — ABNORMAL LOW (ref 36.0–46.0)
HCT: 35.5 % — ABNORMAL LOW (ref 36.0–46.0)
Hemoglobin: 10.6 g/dL — ABNORMAL LOW (ref 12.0–15.0)
Hemoglobin: 11.4 g/dL — ABNORMAL LOW (ref 12.0–15.0)
Hemoglobin: 11.6 g/dL — ABNORMAL LOW (ref 12.0–15.0)
Hemoglobin: 11.8 g/dL — ABNORMAL LOW (ref 12.0–15.0)
Hemoglobin: 11.8 g/dL — ABNORMAL LOW (ref 12.0–15.0)
Hemoglobin: 14.5 g/dL (ref 12.0–15.0)
MCH: 28 pg (ref 26.0–34.0)
MCH: 28.4 pg (ref 26.0–34.0)
MCH: 28.5 pg (ref 26.0–34.0)
MCH: 28.5 pg (ref 26.0–34.0)
MCH: 28.6 pg (ref 26.0–34.0)
MCHC: 33.2 g/dL (ref 30.0–36.0)
MCHC: 33.3 g/dL (ref 30.0–36.0)
MCHC: 33.3 g/dL (ref 30.0–36.0)
MCHC: 33.6 g/dL (ref 30.0–36.0)
MCHC: 34 g/dL (ref 30.0–36.0)
MCV: 83.7 fL (ref 78.0–100.0)
MCV: 83.8 fL (ref 78.0–100.0)
MCV: 83.9 fL (ref 78.0–100.0)
MCV: 84.2 fL (ref 78.0–100.0)
MCV: 84.3 fL (ref 78.0–100.0)
MCV: 84.3 fL (ref 78.0–100.0)
MCV: 84.6 fL (ref 78.0–100.0)
MCV: 85.2 fL (ref 78.0–100.0)
MCV: 85.2 fL (ref 78.0–100.0)
MCV: 85.4 fL (ref 78.0–100.0)
Platelets: 171 10*3/uL (ref 150–400)
Platelets: 187 10*3/uL (ref 150–400)
Platelets: 192 10*3/uL (ref 150–400)
Platelets: 198 10*3/uL (ref 150–400)
Platelets: 203 10*3/uL (ref 150–400)
Platelets: 208 10*3/uL (ref 150–400)
Platelets: 212 10*3/uL (ref 150–400)
Platelets: 213 10*3/uL (ref 150–400)
Platelets: 218 10*3/uL (ref 150–400)
Platelets: 219 10*3/uL (ref 150–400)
Platelets: 256 10*3/uL (ref 150–400)
RBC: 3.13 MIL/uL — ABNORMAL LOW (ref 3.87–5.11)
RBC: 3.26 MIL/uL — ABNORMAL LOW (ref 3.87–5.11)
RBC: 3.73 MIL/uL — ABNORMAL LOW (ref 3.87–5.11)
RBC: 3.73 MIL/uL — ABNORMAL LOW (ref 3.87–5.11)
RBC: 3.84 MIL/uL — ABNORMAL LOW (ref 3.87–5.11)
RBC: 3.85 MIL/uL — ABNORMAL LOW (ref 3.87–5.11)
RBC: 4.04 MIL/uL (ref 3.87–5.11)
RBC: 4.22 MIL/uL (ref 3.87–5.11)
RDW: 14 % (ref 11.5–15.5)
RDW: 14.2 % (ref 11.5–15.5)
RDW: 14.5 % (ref 11.5–15.5)
RDW: 14.5 % (ref 11.5–15.5)
RDW: 14.6 % (ref 11.5–15.5)
RDW: 14.9 % (ref 11.5–15.5)
RDW: 15.1 % (ref 11.5–15.5)
WBC: 12.5 10*3/uL — ABNORMAL HIGH (ref 4.0–10.5)
WBC: 12.9 10*3/uL — ABNORMAL HIGH (ref 4.0–10.5)
WBC: 13 10*3/uL — ABNORMAL HIGH (ref 4.0–10.5)
WBC: 13.1 10*3/uL — ABNORMAL HIGH (ref 4.0–10.5)
WBC: 14.3 10*3/uL — ABNORMAL HIGH (ref 4.0–10.5)
WBC: 14.4 10*3/uL — ABNORMAL HIGH (ref 4.0–10.5)
WBC: 14.5 10*3/uL — ABNORMAL HIGH (ref 4.0–10.5)
WBC: 16.8 10*3/uL — ABNORMAL HIGH (ref 4.0–10.5)

## 2011-01-06 LAB — BASIC METABOLIC PANEL
BUN: 11 mg/dL (ref 6–23)
BUN: 11 mg/dL (ref 6–23)
BUN: 12 mg/dL (ref 6–23)
BUN: 9 mg/dL (ref 6–23)
CO2: 22 mEq/L (ref 19–32)
CO2: 28 mEq/L (ref 19–32)
CO2: 31 mEq/L (ref 19–32)
CO2: 31 mEq/L (ref 19–32)
CO2: 33 mEq/L — ABNORMAL HIGH (ref 19–32)
Calcium: 7.9 mg/dL — ABNORMAL LOW (ref 8.4–10.5)
Calcium: 8.2 mg/dL — ABNORMAL LOW (ref 8.4–10.5)
Calcium: 8.2 mg/dL — ABNORMAL LOW (ref 8.4–10.5)
Calcium: 8.3 mg/dL — ABNORMAL LOW (ref 8.4–10.5)
Calcium: 8.4 mg/dL (ref 8.4–10.5)
Calcium: 8.4 mg/dL (ref 8.4–10.5)
Calcium: 8.9 mg/dL (ref 8.4–10.5)
Chloride: 100 mEq/L (ref 96–112)
Chloride: 103 mEq/L (ref 96–112)
Chloride: 104 mEq/L (ref 96–112)
Chloride: 106 mEq/L (ref 96–112)
Chloride: 109 mEq/L (ref 96–112)
Creatinine, Ser: 0.87 mg/dL (ref 0.4–1.2)
Creatinine, Ser: 0.87 mg/dL (ref 0.4–1.2)
Creatinine, Ser: 0.9 mg/dL (ref 0.4–1.2)
Creatinine, Ser: 0.93 mg/dL (ref 0.4–1.2)
Creatinine, Ser: 1.03 mg/dL (ref 0.4–1.2)
Creatinine, Ser: 1.09 mg/dL (ref 0.4–1.2)
GFR calc Af Amer: >60 mL/min (ref >60)
GFR calc Af Amer: >60 mL/min (ref >60)
GFR calc Af Amer: >60 mL/min (ref >60)
GFR calc Af Amer: >60 mL/min (ref >60)
GFR calc Af Amer: >60 mL/min (ref >60)
GFR calc non Af Amer: 57 mL/min — ABNORMAL LOW (ref >60)
GFR calc non Af Amer: 59 mL/min — ABNORMAL LOW (ref >60)
GFR calc non Af Amer: >60 mL/min (ref >60)
GFR calc non Af Amer: >60 mL/min (ref >60)
GFR calc non Af Amer: >60 mL/min (ref >60)
GFR calc non Af Amer: >60 mL/min (ref >60)
Glucose, Bld: 103 mg/dL — ABNORMAL HIGH (ref 70–99)
Glucose, Bld: 132 mg/dL — ABNORMAL HIGH (ref 70–99)
Glucose, Bld: 155 mg/dL — ABNORMAL HIGH (ref 70–99)
Glucose, Bld: 160 mg/dL — ABNORMAL HIGH (ref 70–99)
Glucose, Bld: 172 mg/dL — ABNORMAL HIGH (ref 70–99)
Potassium: 3.4 mEq/L — ABNORMAL LOW (ref 3.5–5.1)
Potassium: 3.8 mEq/L (ref 3.5–5.1)
Potassium: 3.8 mEq/L (ref 3.5–5.1)
Potassium: 4 mEq/L (ref 3.5–5.1)
Potassium: 4 mEq/L (ref 3.5–5.1)
Potassium: 4.8 mEq/L (ref 3.5–5.1)
Sodium: 138 mEq/L (ref 135–145)
Sodium: 139 mEq/L (ref 135–145)
Sodium: 140 mEq/L (ref 135–145)
Sodium: 141 mEq/L (ref 135–145)
Sodium: 141 mEq/L (ref 135–145)
Sodium: 142 mEq/L (ref 135–145)
Sodium: 143 mEq/L (ref 135–145)

## 2011-01-06 LAB — CREATININE, URINE, RANDOM: Creatinine, Urine: 160.1 mg/dL

## 2011-01-06 LAB — COMPREHENSIVE METABOLIC PANEL
ALT: 30 U/L (ref 0–35)
AST: 29 U/L (ref 0–37)
AST: 38 U/L — ABNORMAL HIGH (ref 0–37)
AST: 43 U/L — ABNORMAL HIGH (ref 0–37)
Albumin: 2.3 g/dL — ABNORMAL LOW (ref 3.5–5.2)
Albumin: 2.5 g/dL — ABNORMAL LOW (ref 3.5–5.2)
Alkaline Phosphatase: 50 U/L (ref 39–117)
Alkaline Phosphatase: 57 U/L (ref 39–117)
Alkaline Phosphatase: 62 U/L (ref 39–117)
BUN: 8 mg/dL (ref 6–23)
BUN: 9 mg/dL (ref 6–23)
CO2: 29 mEq/L (ref 19–32)
Calcium: 8.7 mg/dL (ref 8.4–10.5)
Chloride: 104 mEq/L (ref 96–112)
Chloride: 105 mEq/L (ref 96–112)
Creatinine, Ser: 0.8 mg/dL (ref 0.4–1.2)
GFR calc Af Amer: >60 mL/min (ref >60)
GFR calc Af Amer: >60 mL/min (ref >60)
GFR calc Af Amer: >60 mL/min (ref >60)
Glucose, Bld: 140 mg/dL — ABNORMAL HIGH (ref 70–99)
Potassium: 3.8 mEq/L (ref 3.5–5.1)
Potassium: 4.2 mEq/L (ref 3.5–5.1)
Potassium: 4.5 mEq/L (ref 3.5–5.1)
Sodium: 138 mEq/L (ref 135–145)
Sodium: 140 mEq/L (ref 135–145)
Total Bilirubin: 0.2 mg/dL — ABNORMAL LOW (ref 0.3–1.2)
Total Bilirubin: 0.5 mg/dL (ref 0.3–1.2)
Total Protein: 6.9 g/dL (ref 6.0–8.3)
Total Protein: 7.8 g/dL (ref 6.0–8.3)
Total Protein: 8 g/dL (ref 6.0–8.3)

## 2011-01-06 LAB — RAPID URINE DRUG SCREEN, HOSP PERFORMED
Amphetamines: NOT DETECTED
Barbiturates: NOT DETECTED
Cocaine: POSITIVE — AB
Opiates: NOT DETECTED
Tetrahydrocannabinol: NOT DETECTED

## 2011-01-06 LAB — CULTURE, BLOOD (ROUTINE X 2)
Culture  Setup Time: 201111171510
Culture  Setup Time: 201111171510
Culture  Setup Time: 201111240358
Culture  Setup Time: 201111261724
Culture: NO GROWTH
Culture: NO GROWTH
Culture: NO GROWTH
Culture: NO GROWTH
Culture: NO GROWTH

## 2011-01-06 LAB — LEGIONELLA ANTIGEN, URINE

## 2011-01-06 LAB — URINE CULTURE
Colony Count: NO GROWTH
Colony Count: NO GROWTH
Colony Count: NO GROWTH
Culture  Setup Time: 201111180509
Culture  Setup Time: 201111240435
Culture: NO GROWTH
Culture: NO GROWTH
Special Requests: NEGATIVE

## 2011-01-06 LAB — CULTURE, BAL-QUANTITATIVE

## 2011-01-06 LAB — CULTURE, BAL-QUANTITATIVE W GRAM STAIN
Colony Count: >=100000
Culture: NO GROWTH

## 2011-01-06 LAB — DIFFERENTIAL
Basophils Absolute: 0 10*3/uL (ref 0.0–0.1)
Basophils Relative: 0 % (ref 0–1)
Eosinophils Relative: 3 % (ref 0–5)
Lymphs Abs: 5.2 10*3/uL — ABNORMAL HIGH (ref 0.7–4.0)
Monocytes Relative: 7 % (ref 3–12)
Neutro Abs: 6.6 10*3/uL (ref 1.7–7.7)
Neutrophils Relative %: 51 % (ref 43–77)

## 2011-01-06 LAB — URINALYSIS, ROUTINE W REFLEX MICROSCOPIC
Bilirubin Urine: NEGATIVE
Hgb urine dipstick: NEGATIVE
Ketones, ur: NEGATIVE mg/dL
Nitrite: NEGATIVE
Protein, ur: NEGATIVE mg/dL
Specific Gravity, Urine: 1.013 (ref 1.005–1.030)
pH: 7 (ref 5.0–8.0)

## 2011-01-06 LAB — PROCALCITONIN
Procalcitonin: <0.1 ng/mL
Procalcitonin: <0.1 ng/mL

## 2011-01-06 LAB — SEDIMENTATION RATE
Sed Rate: 130 mm/hr — ABNORMAL HIGH (ref 0–22)
Sed Rate: 40 mm/hr — ABNORMAL HIGH (ref 0–22)

## 2011-01-06 LAB — CK TOTAL AND CKMB (NOT AT ARMC)
CK, MB: 2.5 ng/mL (ref 0.3–4.0)
Relative Index: 0.5 (ref 0.0–2.5)
Total CK: 525 U/L — ABNORMAL HIGH (ref 7–177)

## 2011-01-06 LAB — MRSA PCR SCREENING: MRSA by PCR: NEGATIVE

## 2011-01-06 LAB — VANCOMYCIN, TROUGH: Vancomycin Tr: 8.6 ug/mL — ABNORMAL LOW (ref 10.0–20.0)

## 2011-01-06 LAB — MAGNESIUM
Magnesium: 2.2 mg/dL (ref 1.5–2.5)
Magnesium: 2.5 mg/dL (ref 1.5–2.5)

## 2011-01-06 LAB — CLOSTRIDIUM DIFFICILE BY PCR: Toxigenic C. Difficile by PCR: NOT DETECTED

## 2011-01-06 LAB — STREP PNEUMONIAE URINARY ANTIGEN: Strep Pneumo Urinary Antigen: NEGATIVE

## 2011-01-06 LAB — BRAIN NATRIURETIC PEPTIDE
Pro B Natriuretic peptide (BNP): 69.9 pg/mL (ref 0.0–100.0)
Pro B Natriuretic peptide (BNP): <30 pg/mL (ref 0.0–100.0)

## 2011-01-06 LAB — D-DIMER, QUANTITATIVE: D-Dimer, Quant: 0.35 ug/mL-FEU (ref 0.00–0.48)

## 2011-01-06 LAB — PHOSPHORUS: Phosphorus: 3.1 mg/dL (ref 2.3–4.6)

## 2011-03-10 NOTE — H&P (Signed)
NAMEJAENA, BROCATO              ACCOUNT NO.:  [id number]   MEDICAL RECORD NO.:  [id number]          PATIENT TYPE:  INP   LOCATION:  0102                         FACILITY:  [facility]   PHYSICIAN:  Mobolaji B. Bakare, M.D.DATE OF BIRTH:  07-04-1966   DATE OF ADMISSION:  03/21/2008  DATE OF DISCHARGE:                              HISTORY & PHYSICAL   PRIMARY CARE PHYSICIAN:  Unassigned.   CHIEF COMPLAINT:  Cough and shortness of breath for 3 weeks.   HISTORY OF PRESENTING COMPLAINT:  Ms. Mormile is a 45 year old African-  American female with ongoing tobacco abuse.  She started feeling unwell  about 3 weeks ago.  Symptoms started with cough and associated with  wheezing.  She had tried using over-the-counter medications without much  relief.  She has become short of breath and cough is productive of  yellow sputum.  She denies fever or chills.  She decided to come to the  emergency room today.  Upon arrival she was noted to be wheezing.  The  patient has received 2 nebulization treatments, and there is some  improvement.  She was saturating at 87% on room air, at the time of  evaluation in the emergency room.  She is currently on 2 L of oxygen and  saturating at  95%.   The patient denies fever.  She had similar symptoms about 5 years ago.  She has never been diagnosed with asthma.   REVIEW OF SYSTEMS:  She denies nausea, vomiting, diarrhea, abdominal  pain.  She has some headaches, but no change in her vision and no  localized weakness.  She denies dysuria, urgency or increased frequency  of micturition.  She denies sore throat, postnasal drip or sinus  problems.   PAST MEDICAL HISTORY:  1. History of pelvic inflammatory disease.  2. History of vulvar warts.  3. Ongoing tobacco abuse.   PAST SURGICAL HISTORY:  Hysterectomy.   MEDICATIONS:  None.   ALLERGIES:  NONE.   FAMILY HISTORY:  Father is alive.  He has hypertension and diabetes  mellitus.  Mother passed away from  complications of multiple sclerosis  at the age of 21.   SOCIAL HISTORY:  The patient has never been married.  She is gravida 2,  para 2 and has 1baby up for adoption.  She has a 84 year old daughter.  She smokes cigarettes and she has been smoking for about 25 years.  Currently smokes about 2 packs in 3 days.  She drinks 1 beer bottle per  day.  She tells me she has never had withdrawal symptoms.  She works as  a Conservation officer, nature in [employer].   PHYSICAL EXAMINATION:  INITIAL VITALS:  Temperature 99.  Blood pressure  141/96.  Current blood pressure is 120/79, pulse rate of 120-125,  respiratory rate of 18-28, O2 sats 95% on 3 L of oxygen.  GENERAL:  On examination the patient is awake, alert, mildly dyspneic.  Not pale, anicteric.  No elevated JVD.  No carotid bruit.  LUNGS:  Bilateral expiratory rhonchi and no crackles.  CVS:  S1-S2, tachycardic.  ABDOMEN:  Not distended,  soft, nontender.  No palpable organomegaly.  Bowel sounds present.  EXTREMITIES: No pedal edema or calf tenderness.  Dorsalis pedis pulses  palpable bilaterally.  No distal cyanosis.  CNS:  No focal neurological deficit.  SKIN:  No petechiae, no rash.   INITIAL LABORATORY DATA:  None available at this time.  Chest x-ray  shows no acute cardiopulmonary disease.   ASSESSMENT AND PLAN:  Ms. Eisenhart is a 45 year old African-American  female with a history of tobacco abuse, presenting with productive  cough, shortness of breath, wheezing.  Chest x-ray shows no acute  pulmonary disease.  She was hypoxic on room air with O2 sats of 89%, and  currently mildly dyspneic with lateral rhonchi on physical examination.  She has no history of asthma.   She will be admitted for acute bronchitis with hypoxia and sinus  tachycardia, but we need to rule out pulmonary embolism.  She will be  continued on Atrovent and Xopenex nebulizer.  The patient has received  60 mg of prednisone in the emergency room.  Will continue with  steroid  p.o.  I will have a low threshold to place on Zithromax, given the low  grade fever of 99, and duration of illness, although chest x-ray is not  remarkable.  Would check and D-dimer.  If elevated will proceed to  obtain a computer tomography angiogram of the chest to rule out  pulmonary embolus.  Will obtain an ABG, a complete blood count, and a  differential.  Will also check an electrocardiogram in view of the sinus  tachycardia.  1. Ongoing tobacco abuse.  Nicotine patch 21 mg daily.  Will offer      tobacco cessation counseling.      Mobolaji B. Corky Downs, M.D.  Electronically Signed     MBB/MEDQ  D:  03/21/2008  T:  03/21/2008  Job:  [id number]

## 2011-03-10 NOTE — Discharge Summary (Signed)
NAMEAUDRIS, SPEAKER NO.:  [id number]   MEDICAL RECORD NO.:  [id number]          PATIENT TYPE:  INP   LOCATION:  1510                         FACILITY:  [facility]   PHYSICIAN:  Hillery Aldo, M.D.   DATE OF BIRTH:  01/17/1966   DATE OF ADMISSION:  03/21/2008  DATE OF DISCHARGE:  03/27/2008                               DISCHARGE SUMMARY   PRIMARY CARE PHYSICIAN:  None.  The patient will be referred to  [facility] for hospital followup and ongoing care of her chronic  medical problems.   DISCHARGE MEDICATIONS:  1. Clonidine 0.1 mg b.i.d.  2. Avelox 400 mg daily x3 more days.  3. Doxycycline 100 mg b.i.d. x3 more days.  4. Albuterol 2 puffs q.6 h p.r.n.  5. Atrovent 2 puffs q.6 h p.r.n.  6. Ibuprofen 800 mg q.8 h p.r.n.   BRIEF ADMISSION HPI:  The patient is a 45 year old female who presented  to the hospital with chief complaint of cough and worsening shortness of  breath for 3 weeks prior to presentation.  She was hypoxic upon initial  presentation and had evidence of pneumonia, and therefore was admitted.  For the full details, please see the dictated report done by Dr. Corky Downs.   PROCEDURES AND DIAGNOSTIC STUDIES:  1. Chest X-Ray: On Mar 21, 2008 showed no acute disease.  2. CT angiogram of the chest on Mar 22, 2008 showed no evidence of      pulmonary emboli.  Multiple areas of patchy opacity in both lungs,      compatible with active inflammation or infection.  Mild mediastinal      and bilateral hilar adenopathy.  Mild bibasilar atelectasis.      Multiple small, noncalcified nodules in both lungs, primarily      subpleural in location.  These most likely were felt to represent a      granulomata and areas of nodular subpleural scarring.  Followup CT      of the chest is recommended in 3 to 6 months to assess stability.   1. Chest X-Ray: On Mar 23, 2008 showed increasing patchy bilateral air      space disease.  2. Chest X-Ray: On Mar 24, 2008 showed  patchy air space disease.  The      right lung base suggestive of pneumonia.  It is noted that the      patient's air space disease is underestimated by chest radiography      when compared to the CT appearance.  Changes of chronic obstructive      pulmonary disease.  3. Chest X-Ray: On Mar 25, 2008 showed improving right lower lobe      opacity.   LABORATORY VALUES:  Sodium was 142, potassium 3.8, chloride 106, bicarb  30, BUN 12, creatinine 0.95, glucose 97.  White blood cell count was  14.5, hemoglobin 14.7, hematocrit 43.7, platelets 264,000.  HIV testing  was negative.  Sputum cultures grew normal oral pharyngeal flora.  Legionella antigen and strep pneumonia antigen studies were negative.   CONSULTATIONS:  Dr. Delford Field of pulmonology.   PROCEDURES AND DIAGNOSTIC STUDIES:  Two-dimensional echocardiogram on  Mar 23, 2008 showed small left ventricle with overall left ventricular  systolic function vigorous and an ejection fraction estimated at 65-70%.  There was no diagnostic evidence of left ventricular regional wall  motion abnormalities.  Features were consistent with mild diastolic  dysfunction.   DISCHARGE DIAGNOSES:  1. Right lower lobe pneumonia.  2. Right shoulder bursitis.  3. Steroid-induced hyperglycemia.  4. History of polysubstance abuse.  5. Hypertension.  6. Obesity.  7. History of pelvic inflammatory disease.  8. History of tobacco abuse.  9. Human papilloma viral infection.  10.Pulmonary nodules, followup CT in 3 to 6 months to ensure stability      recommended.  11.Changes of chronic obstructive pulmonary disease.  12.Mild diastolic dysfunction without evidence of acute congestive      heart failure.   HOSPITAL COURSE BY PROBLEM:  1. Right lower lobe pneumonia:  The patient was admitted and although      her initial chest radiography did not reveal pneumonia, the pretest      probability for pneumonia was high.  A CT angiogram was obtained to      rule  out pulmonary embolism and to further assess the possibility      for pneumonia.  Findings are as noted above.  She was put on      doxycycline and Rocephin as initial antibiotic therapy choices.      She was subsequently transitioned over to p.o. Avelox and the      doxycycline was continued.  She required prednisone therapy for      bronchospasm and bronchodilator therapy as well as Mucinex.  With      these interventions, the patient's respiratory status gradually      improved and she is now feeling well.  Her prednisone has been      tapered off.  She is afebrile and her white blood cell count is      steadily coming down over time.  The patient was also evaluated by      Dr. Delford Field of pulmonology and recommendations were to continue      antibiotics as previously ordered, steroids with taper, continue      bronchodilators and to follow up with a full pulmonary function      testing when the patient is stable.  This can be done as an      outpatient.  2. Right shoulder pain:  The patient had complaints of right shoulder      pain and a previous history of right shoulder bursitis.  She      responded well to institution of nonsteroidal anti-inflammatory      medicines.  3. Steroid-induced hyperglycemia:  The patient was put on sliding      scale insulin and at this time, her glycemic control is at baseline      with taper of steroids.  4. Polysubstance abuse:  The patient will be referred to [facility] for      outpatient followup and treatment.  5. Hypertension:  The patient will be discharged on clonidine.  6. Changes of chronic obstructive pulmonary disease:  The patient will      be discharged on bronchodilator therapy and has been advised with      regard to the importance of tobacco cessation.  7. Tobacco abuse:  The patient was provided with a nicotine patch and      again was counseled on the importance of cessation.  8. Human papillomavirus:  The patient complained of external  genital      warts and she was advised that she should follow up with an OB/GYN      or dermatologist for ongoing Pap smears and treatment if indicated.  9. Mild diastolic dysfunction:  The patient did have a two-dimensional      echocardiogram done.  Did not show any evidence of systolic      dysfunction.  The patient did not have any evidence of      decompensated heart failure.   DISPOSITION:  The patient is medically stable for discharge.  She does  not have a primary care physician and is referred to [facility] for  followup.  Again, she will be referred to [facility] for ongoing treatment of  her polysubstance abuse.      Hillery Aldo, M.D.  Electronically Signed     CR/MEDQ  D:  03/27/2008  T:  03/27/2008  Job:  [id number]   cc:   Melvern Banker  Fax: [phone]

## 2011-03-13 NOTE — Op Note (Signed)
Michele Parrish, Michele Parrish              ACCOUNT NO.:  [id number]   MEDICAL RECORD NO.:  [id number]          PATIENT TYPE:  INP   LOCATION:  9306                          FACILITY:  [facility]   PHYSICIAN:  Genia Del, M.D.DATE OF BIRTH:  1966-03-04   DATE OF PROCEDURE:  12/04/2004  DATE OF DISCHARGE:                                 OPERATIVE REPORT   PREOPERATIVE DIAGNOSES:  Right tuboovarian abscess and severe anemia,  hemoglobin 7.6.   POSTOPERATIVE DIAGNOSES:  Right tuboovarian abscess and severe anemia,  hemoglobin 7.6.  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   SURGEON:  Genia Del, M.D.   ASSISTANT:  Pershing Cox, M.D.   ANESTHESIOLOGIST:  Octaviano Glow. Pamalee Leyden, M.D.   INTERVENTION:  Open diagnostic laparoscopy and laparotomy with total  abdominal hysterectomy and bilateral salpingo-oophorectomy. Cultures done on  pus, perfuse irrigation.   DESCRIPTION OF PROCEDURE:  Under general anesthesia with endotracheal  intubation, the patient is in lithotomy position for operative laparoscopy.  She is prepped with Betadine on the abdominal suprapubic, vulvar and vaginal  area and draped as usual. The bladder catheter is inserted. The speculum is  inserted in the vagina. The cannula is put in place in the uterus and the  anterior lip of the cervix is grasped.  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  We infiltrated the infraumbilical subcutaneous tissue with  Marcaine 0.25, 10 mL. We make an infraumbilical incision with the scalpel.  We then opened the aponeurosis with Mayo scissors and the parietoperitoneum  bluntly with a finger. We put a #0 Vicryl stitch in a pursestring fashion at  the aponeurosis and insert the trocar at that level with the laparoscope. We  create a pneumoperitoneum with CO2.  The inspection of the abdominopelvic  cavity reveals wide spread adhesions which prevent visualization of any  pelvic organs. The omentum is covering  the oral cavity. We make a suprapubic  incision with a scalpel over 5 mm. We insert a 5 mm trocar at that level  with a probe. We succeed in releasing the fine adhesions between the omentum  and the abdominopelvic wall and visualized the uterus and the adnexae.  Everything is frozen in old adhesions as well as presenting acute  inflammation. No pus is visualized. Mild bleeding is present upon  manipulation with the probe.  We cannot continue the surgery by laparoscopy  given the frozen pelvis.  The decision is therefore taken to remove all  instruments, evacuate the CO2. We closed the infraumbilical incision by  attaching the aponeurosis with the #0 Vicryl pursestring and then closing  the skin with a subcuticular 4-0 Vicryl.  Hemostasis is adequate at that  level. We reposition the patient and proceed with a Pfannenstiel incision  with the scalpel. We open the aponeurosis transversely with Mayo scissor.  The aponeurosis is separated from the recti muscles superiorly and  inferiorly on the midline. We then open the parietoperitoneum longitudinally  with Metzenbaum scissors.  We put the Balfour retractor in place with the  extender superiorly and bladder blade inferiorly. We retract the bowels with  laps under the retractor that is attached to the extender. Given the  widespread inflammatory reaction on all pelvic organs and the bilateral  hydrosalpinges with bilateral ovarian masses. The decision is made to  proceed with TAH/BSO.  The patient has given [name] full consent to proceed with  that surgery if indicated.  We suture the left round ligament with a #0  Vicryl. We section with the electrocautery. The left ureter is palpated deep  in normal anatomic position. We double clamp the left infundibulopelvic  ligament with Masterson's. We section with Mayo scissors. We suture with #0  Vicryl and a free tie is added. We then free the left ovary with Metzenbaum  scissors and blunt dissection. We  then clamp the tube and uteroovarian  ligament with Masterson's, a Tresa Endo is on the uterus.  We section between the  two and send the left adnexa for pathology. As we are working on the left  adnexa, pus comes out of the right ovary. A culture both for anaerobes and  aerobes is done.  We suction that pus.  We then proceed on the right side  exactly the same way and the right adnexa is sent to pathology.  A Gram  stain is requested on that ovary.  We then open the visceral peritoneum  anteriorly and the bladder is retracted downward.  We clamped the uterine  arteries on each side with curved Heaney's, we section with Mayo scissors  and suture with #0 Vicryl. A second suture is applied on the uterine artery.  We then use straight Heaney's on each side of the uterus. We clamp, section  and suture with #0 Vicryl. We attain the uterosacral ligaments on each side,  those are taken with curved Heaney's, clamped, sectioned and sutured with #0  Vicryl and kept on a hemostat on each side. We then reach the angles of the  vagina, a curved Heaney is used on each side.  We clamp, section and suture  with a Heaney stitch of #0 Vicryl.  One side of that suture is used to  attach with one string of the uterosacral ligament for suspension on each  side. We then put X stitches of #0 Vicryl in the middle of the vagina to  finish closing it. Hemostasis is adequate at that level.  We then profusely  here irrigate the abdominopelvic cavity and suction. We also removed the  laps and continue irrigation and suction. We verify the bowels, no abscess  or pus is present at that level.  We clean the omentum as much as possible  with irrigation.  Hemostasis is adequate at all levels. The Terressa Koyanagi has been  removed before completing irrigation at the omentum and bowel. We close the  aponeurosis with two half running sutures of #0 Vicryl. We irrigate at the adipose tissue again and suction. We close the skin with staples  and a dry  dressing is applied.  The count of sponges and instruments was complete x2.  The estimated blood loss was 550 mL.  Because of starting hemoglobin at 7.6  with significant blood loss and an infection to overcome in postop, the  decision was taken to transfuse the patient to 2 units preop.  No  complication occurred and the patient was transferred to the recovery room  in stable status. Note that a dose of Mefoxin was given preop and the  patient will be continued on antibiotics postop.      ML/MEDQ  D:  12/04/2004  T:  12/04/2004  Job:  [id number]

## 2011-03-13 NOTE — Consult Note (Signed)
[facility]  Patient:    Michele Parrish, Michele Parrish Visit Number: [id number] MRN: [id number]          Service Type: MED Location: 1800 [id number] [id number] Attending Physician:  Pearletha Alfred Dictated by:   Adolph Pollack, M.D. Proc. Date: 08/01/01 Admit Date:  08/01/2001   CC:         Pearletha Alfred, M.D.   Consultation Report  REQUESTING PHYSICIAN:  Pearletha Alfred, M.D.  REASON FOR CONSULTATION:  Right axillary abscess.  HISTORY OF PRESENT ILLNESS:  Michele Parrish is a 45 year old female who noted some swelling in the right axilla two weeks ago.  It has progressively increased, been associated with increasing pain and fever.  She has tried to "pick at it" as well as put warm compresses on, but it has continued to get larger and more painful.  She has been running a fever at home.  She was seen in the emergency department, and I subsequently was asked to see her.  PAST MEDICAL HISTORY:  Denies chronic medical illnesses.  Previous operations, denies.  ALLERGIES:  None reported.  MEDICATIONS:  Ibuprofen.  SOCIAL HISTORY:  She is single.  She drinks one beer a day, denies tobacco use.  She has had a history of recreational drug use but not intravenously.  FAMILY HISTORY:  Positive for hypertension and heart disease in her father.  REVIEW OF SYSTEMS:  Cardiovascular: She denies heart disease or hypertension. Pulmonary: She denies asthma, tuberculosis, or pneumonia.  Hematologic: She denies any known bleeding disorders.  PHYSICAL EXAMINATION:  GENERAL:  An obese female in no acute distress.  Pleasant and cooperative.  VITAL SIGNS:  Her maximum temperature in the emergency department was 102.5.  CARDIOVASCULAR:  Heart demonstrates increased regular rhythm.  RESPIRATORY:  Breath sounds equal, respiration nonlabored.  ABDOMEN:  Soft and nontender.  MUSCULOSKELETAL:  In the right axillary area, there is an indurated and slightly fluctuant mass measuring  approximately 5 to 6 cm.  No drainage noted.  In the left thigh, there is an indurated area with ulceration but no drainage.  IMPRESSION:  Right axillary abscess.  PLAN: To the operating room for incision and drainage. Dictated by:   Adolph Pollack, M.D. Attending Physician:  Pearletha Alfred DD:  08/01/01 TD:  08/02/01 Job: [id number]

## 2011-03-13 NOTE — Op Note (Signed)
. [facility]  Patient:    Michele Parrish, Michele Parrish Visit Number: [id number] MRN: [id number]          Service Type: Attending:  Adolph Pollack, M.D. Dictated by:   Adolph Pollack, M.D. Proc. Date: 08/01/01                             Operative Report  PREOPERATIVE DIAGNOSIS:  Right axillary abscess. POSTOPERATIVE DIAGNOSIS:  Deep right axillary abscess.  OPERATION PERFORMED:  Complex incision and drainage of deep right axillary abscess.  SURGEON:  Adolph Pollack, M.D.  ANESTHESIA:  General.  DRAINS:  One half inch Penrose.  INDICATIONS FOR PROCEDURE:  The patient is a 45 year old female who has had a groin mass under her right arm.  Now it has become erythematous, fluctuant, pain, associated with fever and white blood cell count of 21,000.  She now presents for operative incision and drainage.  DESCRIPTION OF PROCEDURE:  She was placed supine on the operating table and general anesthetic was administered.  Right axillary area was sterilely prepped and draped.  Using a 19 gauge needle, I localized an area of purulent drainage superiorly in the midaxillary line.  I excised a triangular plug of tissue, inserted my finger and broke up multiloculated abscess.  Purulent drainage was expressed.  Hemostasis was controlled with the cautery and a single 3-0 Vicryl suture.  I then placed a half-inch Penrose drain deep into the cavity and anchored it to the subcutaneous tissues with a single 3-0 Vicryl suture.  I covered the area with a bulky dressing.  The patient tolerated the procedure well without any apparent complications and was taken to the recovery room in satisfactory condition.  I offered the patient admission to the hospital but she had decided that she does not want to be admitted and wants to go home.  She has a sister to take care of her at home.  She will be going home on Tylox and Cefalexin and see me back in the office in one  week.  She has been given an instruction sheet. Dictated by:   Adolph Pollack, M.D. Attending:  Adolph Pollack, M.D. DD:  08/01/01 TD:  08/02/01 Job: [id number]

## 2011-03-13 NOTE — Discharge Summary (Signed)
Michele Parrish, Michele Parrish              ACCOUNT NO.:  [id number]   MEDICAL RECORD NO.:  [id number]          PATIENT TYPE:  INP   LOCATION:  9306                          FACILITY:  [facility]   PHYSICIAN:  Genia Del, M.D.DATE OF BIRTH:  Apr 21, 1966   DATE OF ADMISSION:  11/29/2004  DATE OF DISCHARGE:  12/08/2004                                 DISCHARGE SUMMARY   ADMISSION DIAGNOSIS:  Pelvic inflammatory disease with right tuboovarian  abscess.   DISCHARGE DIAGNOSES:  1.  Pelvic inflammatory disease with right tuboovarian abscess.  2.  Severe anemia.   INTERVENTION:  Diagnostic laparoscopy, laparotomy, plus total abdominal  hysterectomy and bilateral salpingo-oophorectomy on December 04, 2004.   HOSPITAL COURSE:  The patient was admitted on November 29, 2004 with onset of  lower abdominal pain for 6 days, progressing to severe pain at the time of  admission. She had a fever up to 102 at home. On CT scan, a complex fluid  collection within the pelvis was seen on the right side. The uterus was  shown to have fibroids on ultrasound and bilateral hydrosalpinx were seen.  Her white blood cell count was 23.4 and her hemoglobin was 10.1. The patient  was started on IV antibiotics with clindamycin and gentamycin and Zithromax  1 g p.o. was given. She responded to IV antibiotics. 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 She was  started on Aygestin 5 mg daily and Lupron was given. On December 03, 2004 her  white blood cell count was 20.7 - it was not decreased as expected; her  hemoglobin was 8. At this point it was decided to proceed with surgical  approach. A consult in infectious disease was requested. On December 04, 2004  the patient came to the operating room. A diagnostic laparoscopy was done.  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 The decision  was taken  to proceed with laparotomy and a total abdominal hysterectomy with  bilateral salpingo-oophorectomy was done. The patient had to be transfused 2  units of red blood cells periop. The postoperative evolution went very well.  The patient was kept on antibiotics. She had a white blood cell count  decreased to 15,000 on February 12 and was afebrile. She was discharged home  on postoperative day #4 in stable status. She was given Augmentin 875 p.o.  b.i.d. for 10 days, Tylox p.r.n., and Motrin p.r.n. Postoperative advice was  given and she will follow up at the office in 4 weeks.      ML/MEDQ  D:  02/26/2005  T:  02/26/2005  Job:  [id number]

## 2011-07-22 LAB — SEDIMENTATION RATE: Sed Rate: 12

## 2011-07-22 LAB — CULTURE, RESPIRATORY W GRAM STAIN

## 2011-07-22 LAB — BLOOD GAS, ARTERIAL
Acid-base deficit: 2.6 — ABNORMAL HIGH
Acid-base deficit: 5.1 — ABNORMAL HIGH
Drawn by: 229971
FIO2: 0.21
O2 Content: 3
O2 Saturation: 85.6
O2 Saturation: 91.2
Patient temperature: 98.6
TCO2: 17.3
pCO2 arterial: 32.7 — ABNORMAL LOW
pCO2 arterial: 36.3
pH, Arterial: 7.416 — ABNORMAL HIGH
pO2, Arterial: 47.8 — ABNORMAL LOW

## 2011-07-22 LAB — COMPREHENSIVE METABOLIC PANEL
ALT: 18
AST: 26
Albumin: 2.8 — ABNORMAL LOW
Albumin: 3.7
Calcium: 8.8
Calcium: 9.1
Creatinine, Ser: 0.85
Creatinine, Ser: 1.17
GFR calc Af Amer: >60
GFR calc Af Amer: >60
Glucose, Bld: 142 — ABNORMAL HIGH
Glucose, Bld: 158 — ABNORMAL HIGH
Potassium: 4.1
Sodium: 139
Total Protein: 6.5

## 2011-07-22 LAB — CBC
HCT: 45.4
Hemoglobin: 13.8
Hemoglobin: 14.9
MCHC: 32.5
MCHC: 32.8
MCHC: 33.3
MCHC: 33.4
MCV: 82.4
MCV: 83.3
MCV: 83.8
Platelets: 197
Platelets: 215
Platelets: 238
Platelets: 250
RBC: 5.28 — ABNORMAL HIGH
RBC: 5.37 — ABNORMAL HIGH
RDW: 14.1
RDW: 15.2
RDW: 15.4
RDW: 15.5
WBC: 17.9 — ABNORMAL HIGH

## 2011-07-22 LAB — PHOSPHORUS: Phosphorus: 2.4

## 2011-07-22 LAB — URINALYSIS, MICROSCOPIC ONLY
Bilirubin Urine: NEGATIVE
Glucose, UA: NEGATIVE
Hgb urine dipstick: NEGATIVE
Ketones, ur: NEGATIVE
Nitrite: NEGATIVE
pH: 5.5

## 2011-07-22 LAB — CK TOTAL AND CKMB (NOT AT ARMC)
CK, MB: 3.3
Total CK: 298 — ABNORMAL HIGH

## 2011-07-22 LAB — LEGIONELLA ANTIGEN, URINE

## 2011-07-22 LAB — BASIC METABOLIC PANEL
BUN: 11
BUN: 8
CO2: 23
CO2: 23
Calcium: 9.2
Calcium: 9.7
Chloride: 109
Creatinine, Ser: 0.84
GFR calc Af Amer: >60
GFR calc Af Amer: >60
Glucose, Bld: 156 — ABNORMAL HIGH
Glucose, Bld: 162 — ABNORMAL HIGH
Potassium: 4.6
Sodium: 141

## 2011-07-22 LAB — URINE DRUGS OF ABUSE SCREEN W ALC, ROUTINE (REF LAB)
Barbiturate Quant, Ur: NEGATIVE
Benzodiazepines.: NEGATIVE
Cocaine Metabolites: NEGATIVE
Creatinine,U: 25.4
Marijuana Metabolite: NEGATIVE
Methadone: NEGATIVE
Opiate Screen, Urine: NEGATIVE
Phencyclidine (PCP): NEGATIVE

## 2011-07-22 LAB — D-DIMER, QUANTITATIVE: D-Dimer, Quant: 0.33

## 2011-07-22 LAB — POCT PREGNANCY, URINE
Operator id: 22937
Preg Test, Ur: NEGATIVE

## 2011-07-22 LAB — EXPECTORATED SPUTUM ASSESSMENT W REFEX TO RESP CULTURE

## 2011-07-22 LAB — CARDIAC PANEL(CRET KIN+CKTOT+MB+TROPI)
CK, MB: 4.5 — ABNORMAL HIGH
CK, MB: 6.7 — ABNORMAL HIGH
Relative Index: 1.5
Relative Index: 2.2
Total CK: 298 — ABNORMAL HIGH
Troponin I: 0.03

## 2011-07-22 LAB — DIFFERENTIAL
Basophils Relative: 1
Eosinophils Relative: 1
Lymphocytes Relative: 10 — ABNORMAL LOW
Lymphs Abs: 1.2
Monocytes Absolute: 0.2
Neutro Abs: 9.9 — ABNORMAL HIGH
Neutrophils Relative %: 87 — ABNORMAL HIGH

## 2011-07-22 LAB — LACTATE DEHYDROGENASE: LDH: 195

## 2011-07-22 LAB — EXPECTORATED SPUTUM ASSESSMENT W GRAM STAIN, RFLX TO RESP C

## 2011-07-22 LAB — PREGNANCY, URINE: Preg Test, Ur: NEGATIVE

## 2011-07-22 LAB — MAGNESIUM: Magnesium: 2.4

## 2011-07-22 LAB — TROPONIN I: Troponin I: 0.02

## 2011-07-23 LAB — CBC
HCT: 43.7
Hemoglobin: 14.7
MCV: 83.2
RDW: 15.1
WBC: 14.5 — ABNORMAL HIGH

## 2011-07-23 LAB — BASIC METABOLIC PANEL
BUN: 12
Chloride: 106
GFR calc non Af Amer: >60
Glucose, Bld: 97
Potassium: 3.8
Sodium: 142

## 2012-09-01 IMAGING — CR DG CHEST 1V PORT
1 series · 1 of 1 positions shown · non-contrast
Comparison: Chest x-ray of 09/10/2010 and CT chest of 09/11/2010

CLINICAL DATA: Endotracheal tube placement, COPD

PORTABLE CHEST - 1 VIEW

[view not recorded]
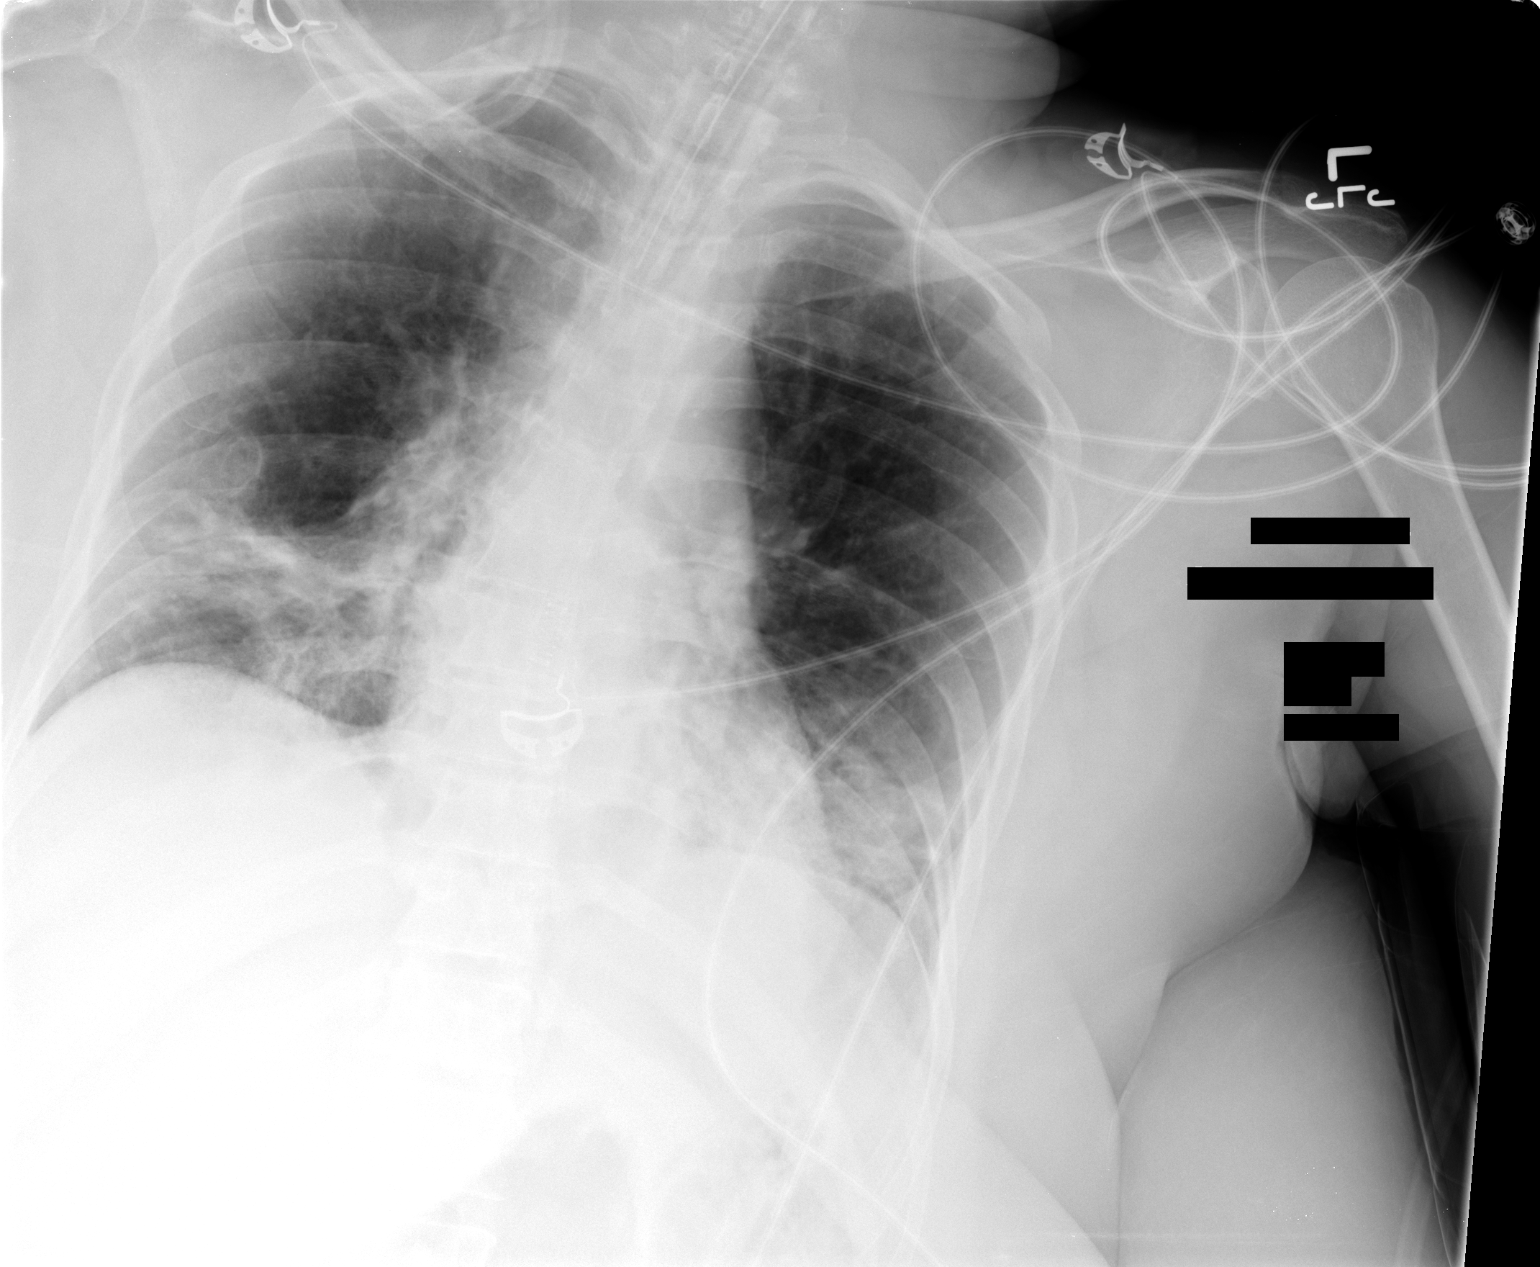

[1 of 1 positions shown; findings below may reference images not displayed]

FINDINGS: Compared to the prior chest x-ray, there has been
definite increase in parenchymal opacity at the lung bases most
consistent with pneumonia.  The tip of the endotracheal tube is
approximately 4.1 cm above the carina.  Heart size is stable.
IMPRESSION: 1.  Increasing basilar parenchymal opacities consistent with
pneumonia.
2.  Endotracheal tip 4.1 cm above the carina.

## 2012-09-02 IMAGING — CR DG CHEST 1V PORT
1 series · 2 of 2 positions shown · non-contrast
Comparison: 09/11/2010

CLINICAL DATA: Respiratory distress.

PORTABLE CHEST - 1 VIEW

[Series 1001: view not recorded · 0.20mm/px · 2 of 2 slices shown]
[im 1/2]
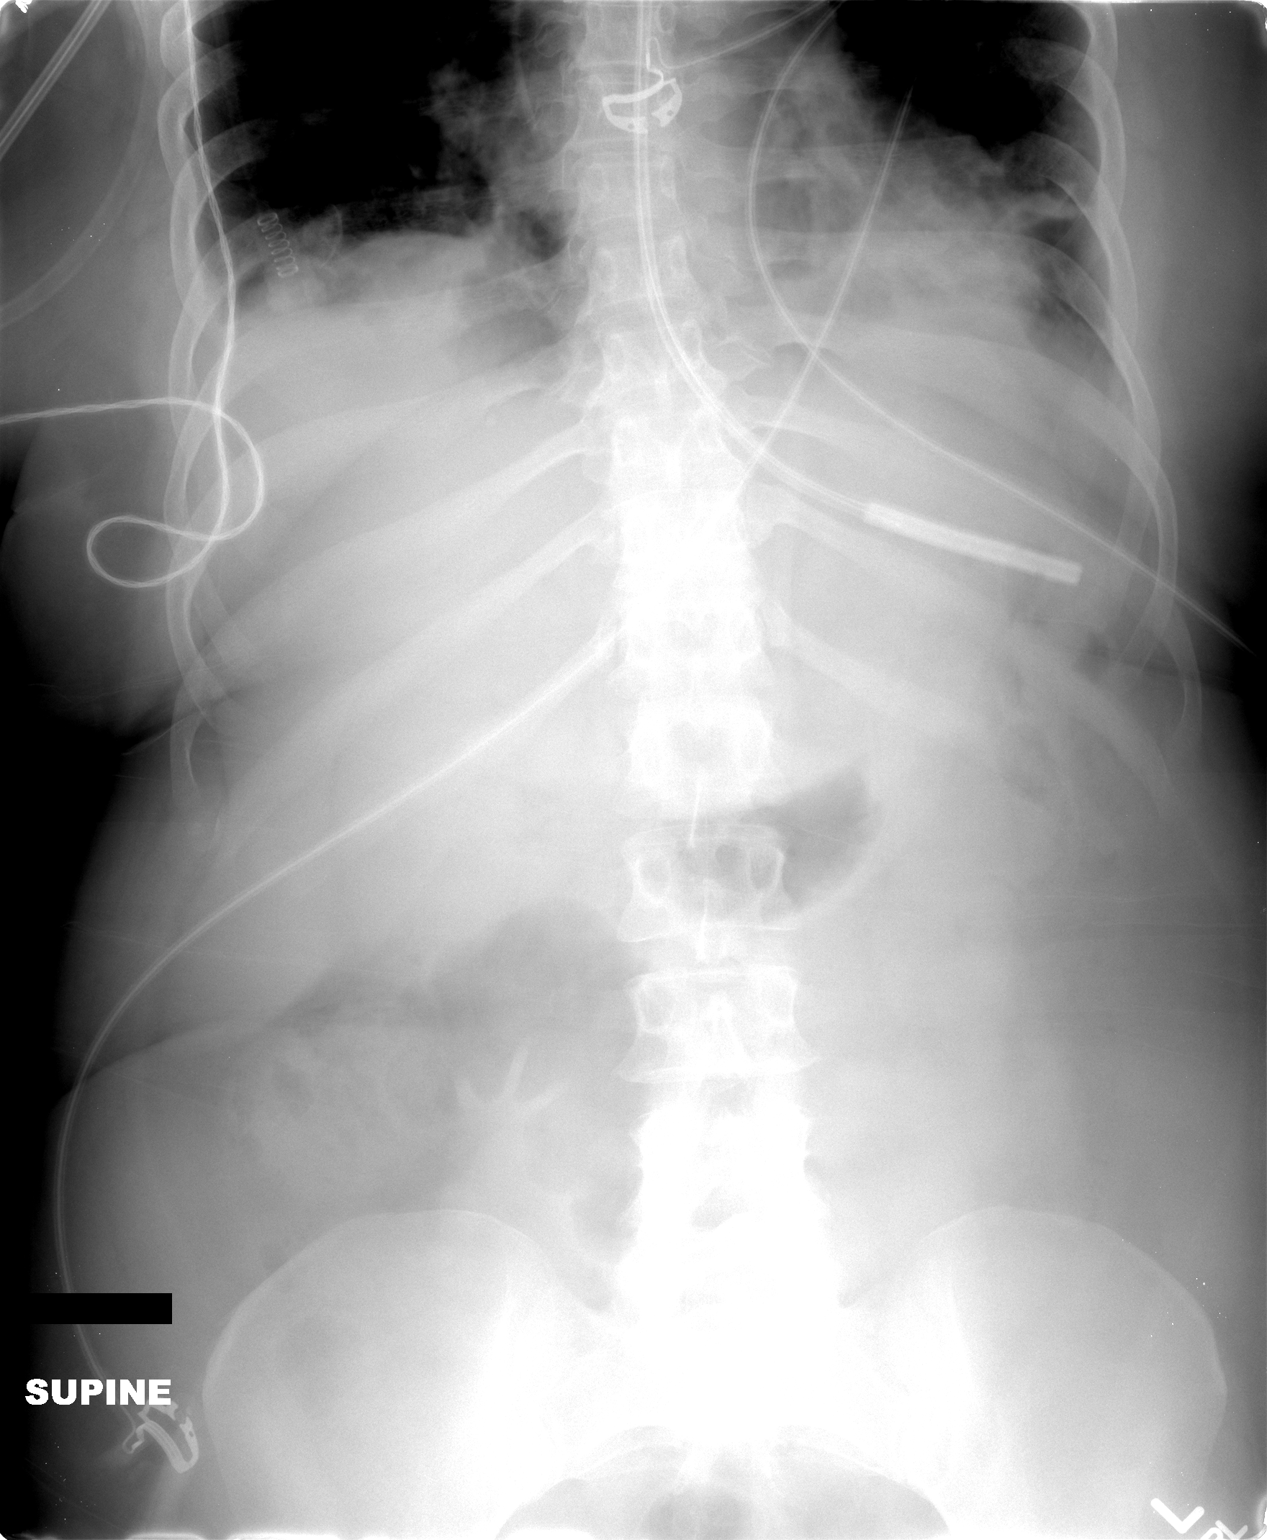
[im 2/2]
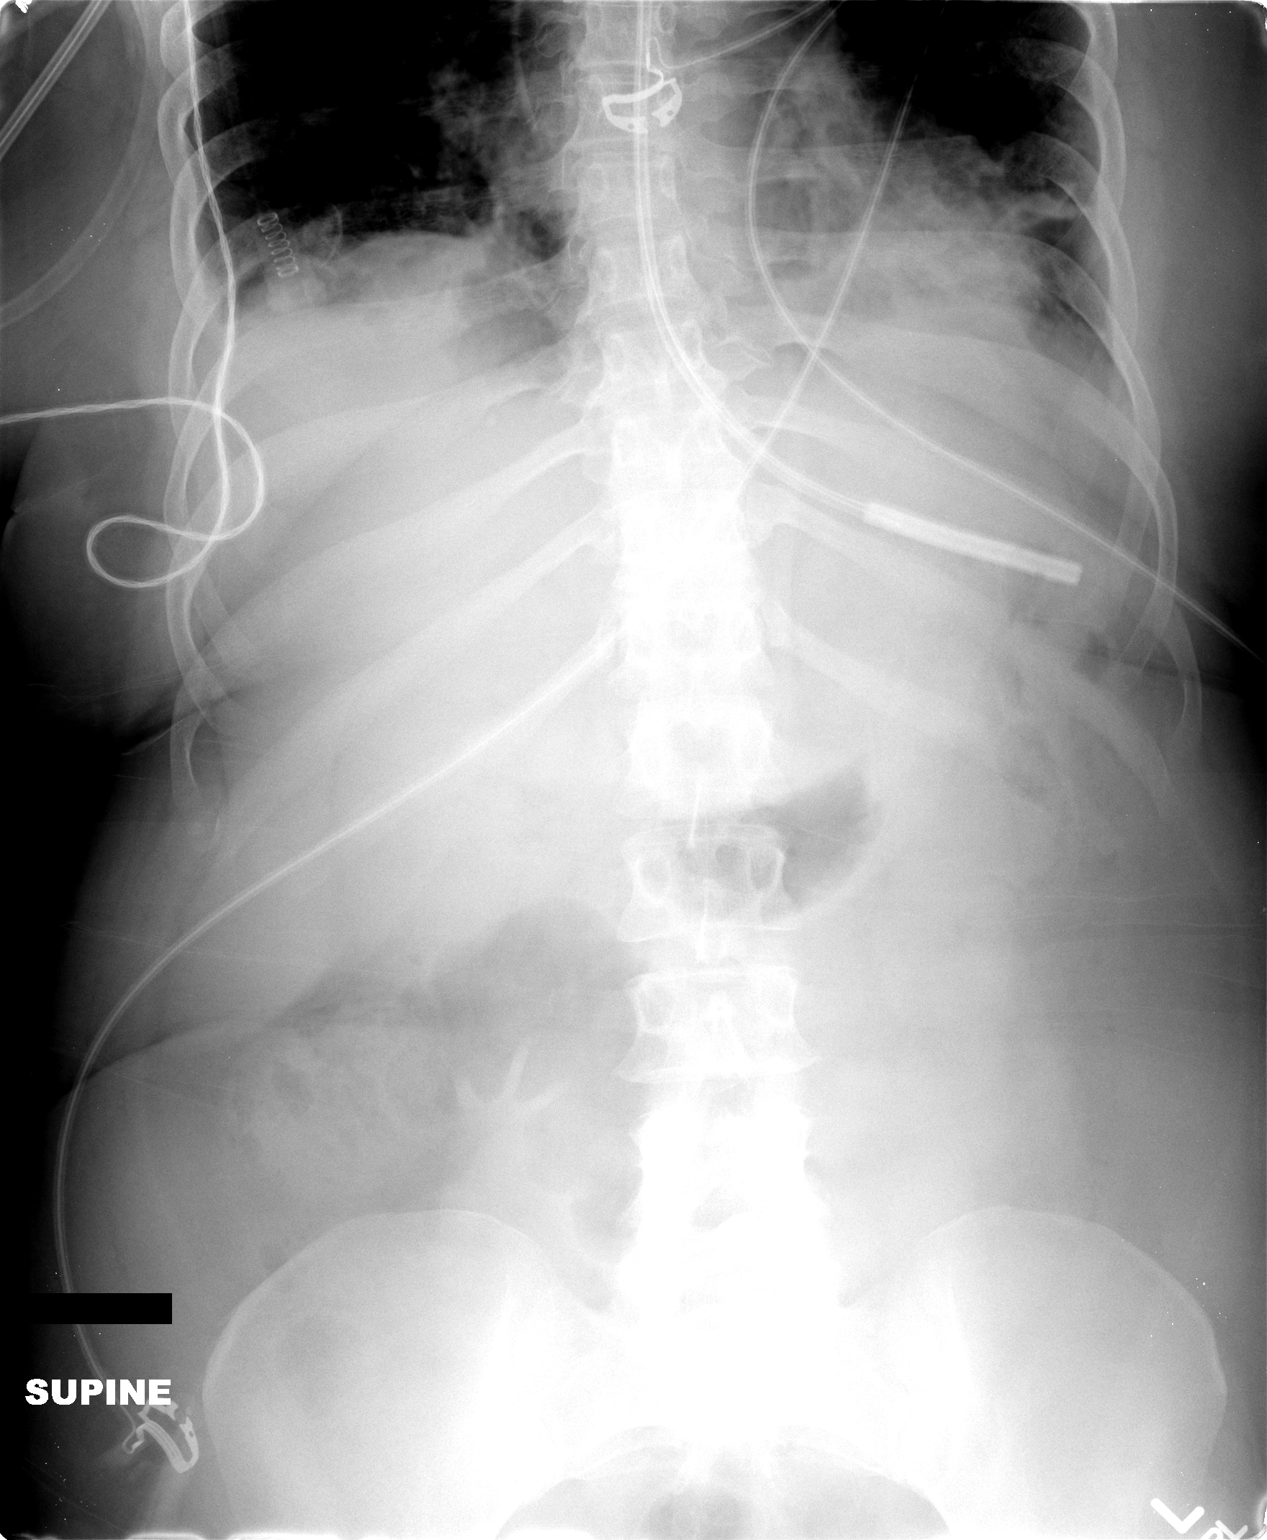

[2 of 2 positions shown; findings below may reference images not displayed]

FINDINGS: Endotracheal tube is in satisfactory position.  Left IJ
central line tip projects over the SVC.  Heart size stable.  There
is basilar dependent air space disease, with interval worsening at
the right lung base.  Probable bilateral pleural effusions.
IMPRESSION: Bibasilar air space disease, with worsening at the right lung base.
Bilateral pleural effusions.

## 2013-01-13 ENCOUNTER — Emergency Department (INDEPENDENT_AMBULATORY_CARE_PROVIDER_SITE_OTHER): Payer: Self-pay

## 2013-01-13 ENCOUNTER — Encounter (HOSPITAL_COMMUNITY): Payer: Self-pay | Admitting: Emergency Medicine

## 2013-01-13 ENCOUNTER — Emergency Department (INDEPENDENT_AMBULATORY_CARE_PROVIDER_SITE_OTHER)
Admission: EM | Admit: 2013-01-13 | Discharge: 2013-01-13 | Disposition: A | Discharge status: Home / Self Care | Payer: Self-pay | Source: Home / Self Care | Attending: Emergency Medicine | Admitting: Emergency Medicine

## 2013-01-13 DIAGNOSIS — S5292XA Unspecified fracture of left forearm, initial encounter for closed fracture: Secondary | ICD-10-CM

## 2013-01-13 DIAGNOSIS — T162XXA Foreign body in left ear, initial encounter: Secondary | ICD-10-CM

## 2013-01-13 DIAGNOSIS — S5290XA Unspecified fracture of unspecified forearm, initial encounter for closed fracture: Secondary | ICD-10-CM

## 2013-01-13 DIAGNOSIS — T169XXA Foreign body in ear, unspecified ear, initial encounter: Secondary | ICD-10-CM

## 2013-01-13 HISTORY — DX: Bronchitis, not specified as acute or chronic: J40

## 2013-01-13 MED ORDER — IBUPROFEN 400 MG PO TABS: 400.0000 mg | ORAL_TABLET | Freq: Three times a day (TID) | ORAL | Status: DC | PRN

## 2013-01-13 NOTE — ED Notes (Signed)
Multiple complaints: reports cotton in ear, "sprain wrist"

## 2013-01-13 NOTE — ED Provider Notes (Signed)
History     CSN: [id number]  Arrival date & time 01/13/13  1005   First MD Initiated Contact with Patient 01/13/13 1011      Chief Complaint  Patient presents with  . Foreign Body in Ear    (Consider location/radiation/quality/duration/timing/severity/associated sxs/prior treatment) HPI Comments: Patient presents to urgent care complaining of left ear pain reports that 2 days ago she was cleaning her left ear with a cotton tip, when it got sucked, inside of her ear. She attempted to remove it several times is uncertain if she was able to remove it all as she continues to experience a sense of occupation. Denies any drainage, but does admit some mild degree soreness. No changes in hearing, denies any dizziness or headaches.    Patient also describes that about a week ago she fell when she was walking her dog on her left hand. She's been sore in the radial aspect of her wrist the last week or so at times the pain seems to go away. Denies any numbness or tingling sensation. Denies any abrasions or lacerations. Wants to have her wrist checked as well.   Patient is a 47 y.o. female presenting with foreign body in ear and wrist pain. The history is provided by the patient and the spouse.  Foreign Body in Ear This is a new problem. The current episode started 2 days ago. The problem occurs constantly. The problem has not changed since onset.Pertinent negatives include no headaches. Nothing aggravates the symptoms. Nothing relieves the symptoms. She has tried nothing for the symptoms.  Wrist Pain This is a new problem. The current episode started more than 1 week ago. The problem occurs constantly. The problem has not changed since onset.Pertinent negatives include no headaches. The symptoms are aggravated by standing. Relieved by: Recurrent pain that resolves totally spontaneously. The treatment provided no relief.    Past Medical History  Diagnosis Date  . Bronchitis     Past Surgical  History  Procedure Laterality Date  . Abdominal hysterectomy      No family history on file.  History  Substance Use Topics  . Smoking status: Current Every Day Smoker  . Smokeless tobacco: Not on file  . Alcohol Use: Yes    OB History   Grav Para Term Preterm Abortions TAB SAB Ect Mult Living                  Review of Systems  Constitutional: Negative for activity change and appetite change.  HENT: Positive for ear pain. Negative for hearing loss, neck pain and ear discharge.   Neurological: Negative for dizziness, weakness, numbness and headaches.    Allergies  Review of patient's allergies indicates no known allergies.  Home Medications   Current Outpatient Rx  Name  Route  Sig  Dispense  Refill  . ibuprofen (ADVIL,MOTRIN) 400 MG tablet   Oral   Take 1 tablet (400 mg total) by mouth every 8 (eight) hours as needed for pain.   30 tablet   0     BP 162/90  Pulse 90  Temp(Src) 98.3 F (36.8 C) (Oral)  Resp 18  SpO2 99%  Physical Exam  Nursing note and vitals reviewed. Constitutional: She is oriented to person, place, and time. She appears well-developed and well-nourished.  HENT:  Head: Normocephalic.  Right Ear: External ear normal.  Left Ear: External ear normal. No drainage, swelling or tenderness. No foreign bodies. No mastoid tenderness. Tympanic membrane is not injected.  No  middle ear effusion. No hemotympanum. No decreased hearing is noted.  Ears:  Mouth/Throat: No oropharyngeal exudate.  Musculoskeletal: She exhibits tenderness.       Hands: Neurological: She is alert and oriented to person, place, and time.  Skin: No rash noted. No erythema. No pallor.    ED Course  Procedures (including critical care time)  Labs Reviewed - No data to display Dg Wrist Complete Left  01/13/2013  *RADIOLOGY REPORT*  Clinical Data: 47 year old female with fall 1 month ago 1 month ago and continued left wrist pain.  LEFT WRIST - COMPLETE 3+ VIEW  Comparison:  None.  Findings: Mild/subtle irregularity along the lateral aspect and radiocarpal surface of the distal radius, might be related to a remote injury.  Distal ulna intact.  Carpal bone alignment within normal limits.  Joint spaces within normal limits.  Metacarpals intact.  Scaphoid intact.  No carpal bone abnormality identified.  IMPRESSION: Irregularity of the distal left radius suspicious for subacute versus chronic fracture.  Radiocarpal joint involvement suspected. Other osseous structures at the left wrist appear intact.   Original Report Authenticated By: Erskine Speed, M.D.      1. Ear foreign body, left, initial encounter   2. Radial fracture, left, closed, initial encounter      Status post irrigation- was able to obtain residual cut some material from ear canal. No ear canal abnormality or bleeding post irrigation and Tegretol and normal looking tympanic membrane. MDM   Problem #1 foreign body in left ear canal- removed without complications Problem #2 uncomplicated radial- carpal injury of left hand after an hyperextended injury. Patient is placed in a thumb spica for immobilization with other measures discussed as rice with NSAIDs instructed to followup with an orthopedic Dr. in 7-10 days.       Jimmie Molly, MD 01/13/13 1500

## 2017-07-22 ENCOUNTER — Ambulatory Visit (INDEPENDENT_AMBULATORY_CARE_PROVIDER_SITE_OTHER): Payer: Self-pay | Admitting: Physician Assistant

## 2017-07-22 ENCOUNTER — Encounter (INDEPENDENT_AMBULATORY_CARE_PROVIDER_SITE_OTHER): Payer: Self-pay | Admitting: Physician Assistant

## 2017-07-22 VITALS — BP 127/86 | HR 103 | Temp 98.8°F | Wt 144.6 lb

## 2017-07-22 DIAGNOSIS — J441 Chronic obstructive pulmonary disease with (acute) exacerbation: Secondary | ICD-10-CM

## 2017-07-22 DIAGNOSIS — Z23 Encounter for immunization: Secondary | ICD-10-CM

## 2017-07-22 MED ORDER — IPRATROPIUM-ALBUTEROL 0.5-2.5 (3) MG/3ML IN SOLN: 3.0000 mL | Freq: Four times a day (QID) | RESPIRATORY_TRACT | 1 refills | Status: DC | PRN

## 2017-07-22 MED ORDER — ALBUTEROL SULFATE HFA 108 (90 BASE) MCG/ACT IN AERS: 2.0000 | INHALATION_SPRAY | RESPIRATORY_TRACT | 5 refills | Status: DC | PRN

## 2017-07-22 MED ORDER — METHYLPREDNISOLONE SODIUM SUCC 125 MG IJ SOLR
125.0000 mg | Freq: Once | INTRAMUSCULAR | Status: AC
  Administered 2017-07-22: 125 mg via INTRAMUSCULAR

## 2017-07-22 MED ORDER — FLUTICASONE FUROATE-VILANTEROL 200-25 MCG/INH IN AEPB: 1.0000 | INHALATION_SPRAY | Freq: Every day | RESPIRATORY_TRACT | 5 refills | Status: DC

## 2017-07-22 NOTE — Progress Notes (Signed)
Subjective:  Patient ID: Michele Parrish, female    DOB: 14-May-1966  Age: 51 y.o. MRN: [id number]  CC: body aches  HPI Michele Parrish is a 51 y.o. female with a medical history of chronic  Bronchitis, tobacco use disorder,  PNA x2, and hypertriglyceridemia presents as a new patient with complaint of SOB. Believes she is have an exacerbation of her bronchitis.Has been using a friend's nebulizer. Used to see pulmonology approximately 5 years ago. Records currently available do not outline her past pharmacotherapy. Has not seen a pulmonologist since. Says she has cut down on smoking to half a pack a day. Does not remember having received pneumococcal vaccinations. Needs flu vaccine for this year. Does not endorse any other symptoms or complaints.        Outpatient Medications Prior to Visit  Medication Sig Dispense Refill  . ibuprofen (ADVIL,MOTRIN) 400 MG tablet Take 1 tablet (400 mg total) by mouth every 8 (eight) hours as needed for pain. 30 tablet 0   No facility-administered medications prior to visit.      ROS Review of Systems  Constitutional: Negative for chills, fever and malaise/fatigue.  Eyes: Negative for blurred vision.  Respiratory: Positive for shortness of breath (mild).   Cardiovascular: Negative for chest pain and palpitations.  Gastrointestinal: Negative for abdominal pain and nausea.  Genitourinary: Negative for dysuria and hematuria.  Musculoskeletal: Negative for joint pain and myalgias.  Skin: Negative for rash.  Neurological: Negative for tingling and headaches.  Psychiatric/Behavioral: Negative for depression. The patient is not nervous/anxious.     Objective:  Wt 144 lb 9.6 oz (65.6 kg)   BMI 26.45 kg/m   BP/Weight 07/22/2017 01/13/2013 11/27/2010  Systolic BP - 162 124  Diastolic BP - 90 82  Wt. (Lbs) 144.6 - 180  BMI 26.45 - 32.91      Physical Exam  Constitutional: She is oriented to person, place, and time.  Well developed, well  nourished, NAD, polite  HENT:  Head: Normocephalic and atraumatic.  Eyes: No scleral icterus.  Cardiovascular: Normal rate, regular rhythm and normal heart sounds.   Pulmonary/Chest: Effort normal and breath sounds normal. No respiratory distress. She has no wheezes. She has no rales.  Musculoskeletal: She exhibits no edema.  Neurological: She is alert and oriented to person, place, and time. No cranial nerve deficit. Coordination normal.  Skin: Skin is warm and dry. No rash noted. No erythema. No pallor.  Psychiatric: She has a normal mood and affect. Her behavior is normal. Thought content normal.  Vitals reviewed.    Assessment & Plan:    1. COPD exacerbation (HCC) - Begin fluticasone furoate-vilanterol (BREO ELLIPTA) 200-25 MCG/INH AEPB; Inhale 1 puff into the lungs daily.  Dispense: 1 each; Refill: 5 - Begin albuterol (PROVENTIL HFA;VENTOLIN HFA) 108 (90 Base) MCG/ACT inhaler; Inhale 2 puffs into the lungs every 4 (four) hours as needed for wheezing or shortness of breath.  Dispense: 1 Inhaler; Refill: 5 - DME Nebulizer machine - Begin ipratropium-albuterol (DUONEB) 0.5-2.5 (3) MG/3ML SOLN; Take 3 mLs by nebulization every 6 (six) hours as needed.  Dispense: 360 mL; Refill: 1 - Begin methylPREDNISolone sodium succinate (SOLU-MEDROL) 125 mg/2 mL injection 125 mg; Inject 2 mLs (125 mg total) into the muscle once.  2. Need for pneumococcal vaccination - Pneumococcal conjugate vaccine 13-valent  3. Need for prophylactic vaccination and inoculation against influenza - Flu Vaccine QUAD 6+ mos PF IM (Fluarix Quad PF)   Meds ordered this encounter  Medications  .  fluticasone furoate-vilanterol (BREO ELLIPTA) 200-25 MCG/INH AEPB    Sig: Inhale 1 puff into the lungs daily.    Dispense:  1 each    Refill:  5    Order Specific Question:   Supervising Provider    Answer:   Sindy Messing DAVID [id number]  . albuterol (PROVENTIL HFA;VENTOLIN HFA) 108 (90 Base) MCG/ACT inhaler    Sig:  Inhale 2 puffs into the lungs every 4 (four) hours as needed for wheezing or shortness of breath.    Dispense:  1 Inhaler    Refill:  5    Order Specific Question:   Supervising Provider    Answer:   Sindy Messing DAVID [id number]  . ipratropium-albuterol (DUONEB) 0.5-2.5 (3) MG/3ML SOLN    Sig: Take 3 mLs by nebulization every 6 (six) hours as needed.    Dispense:  360 mL    Refill:  1    Order Specific Question:   Supervising Provider    Answer:   Sindy Messing DAVID [id number]  . methylPREDNISolone sodium succinate (SOLU-MEDROL) 125 mg/2 mL injection 125 mg    Follow-up: PRN  Loletta Specter PA

## 2017-07-22 NOTE — Patient Instructions (Addendum)
Tobacco Use Disorder Tobacco use disorder (TUD) is a mental disorder. It is the long-term use of tobacco in spite of related health problems or difficulty with normal life activities. Tobacco is most commonly smoked as cigarettes and less commonly as cigars or pipes. Smokeless chewing tobacco and snuff are also popular. People with TUD get a feeling of extreme pleasure (euphoria) from using tobacco and have a desire to use it again and again. Repeated use of tobacco can cause problems. The addictive effects of tobacco are due mainly tothe ingredient nicotine. Nicotine also causes a rush of adrenaline (epinephrine) in the body. This leads to increased blood pressure, heart rate, and breathing rate. These changes may cause problems for people with high blood pressure, weak hearts, or lung disease. High doses of nicotine in children and pets can lead to seizures and death. Tobacco contains a number of other unsafe chemicals. These chemicals are especially harmful when inhaled as smoke and can damage almost every organ in the body. Smokers live shorter lives than nonsmokers and are at risk of dying from a number of diseases and cancers. Tobacco smoke can also cause health problems for nonsmokers (due to inhaling secondhand smoke). Smoking is also a fire hazard. TUD usually starts in the late teenage years and is most common in young adults between the ages of 18 and 25 years. People who start smoking earlier in life are more likely to continue smoking as adults. TUD is somewhat more common in men than women. People with TUD are at higher risk for using alcohol and other drugs of abuse. What increases the risk? Risk factors for TUD include:  Having family members with the disorder.  Being around people who use tobacco.  Having an existing mental health issue such as schizophrenia, depression, bipolar disorder, ADHD, or posttraumatic stress disorder (PTSD).  What are the signs or symptoms? People with  tobacco use disorder have two or more of the following signs and symptoms within 12 months:  Use of more tobacco over a longer period than intended.  Not able to cut down or control tobacco use.  A lot of time spent obtaining or using tobacco.  Strong desire or urge to use tobacco (craving). Cravings may last for 6 months or longer after quitting.  Use of tobacco even when use leads to major problems at work, school, or home.  Use of tobacco even when use leads to relationship problems.  Giving up or cutting down on important life activities because of tobacco use.  Repeatedly using tobacco in situations where it puts you or others in physical danger, like smoking in bed.  Use of tobacco even when it is known that a physical or mental problem is likely related to tobacco use. ? Physical problems are numerous and may include chronic bronchitis, emphysema, lung and other cancers, gum disease, high blood pressure, heart disease, and stroke. ? Mental problems caused by tobacco may include difficulty sleeping and anxiety.  Need to use greater amounts of tobacco to get the same effect. This means you have developed a tolerance.  Withdrawal symptoms as a result of stopping or rapidly cutting back use. These symptoms may last a month or more after quitting and include the following: ? Depressed, anxious, or irritable mood. ? Difficulty concentrating. ? Increased appetite. ? Restlessness or trouble sleeping. ? Use of tobacco to avoid withdrawal symptoms.  How is this diagnosed? Tobacco use disorder is diagnosed by your health care provider. A diagnosis may be made by:    Your health care provider asking questions about your tobacco use and any problems it may be causing.  A physical exam.  Lab tests.  You may be referred to a mental health professional or addiction specialist.  The severity of tobacco use disorder depends on the number of signs and symptoms you have:  Mild-Two or  three symptoms.  Moderate-Four or five symptoms.  Severe-Six or more symptoms.  How is this treated? Many people with tobacco use disorder are unable to quit on their own and need help. Treatment options include the following:  Nicotine replacement therapy (NRT). NRT provides nicotine without the other harmful chemicals in tobacco. NRT gradually lowers the dosage of nicotine in the body and reduces withdrawal symptoms. NRT is available in over-the-counter forms (gum, lozenges, and skin patches) as well as prescription forms (mouth inhaler and nasal spray).  Medicines.This may include: ? Antidepressant medicine that may reduce nicotine cravings. ? A medicine that acts on nicotine receptors in the brain to reduce cravings and withdrawal symptoms. It may also block the effects of tobacco in people with TUD who relapse.  Counseling or talk therapy. A form of talk therapy called behavioral therapy is commonly used to treat people with TUD. Behavioral therapy looks at triggers for tobacco use, how to avoid them, and how to cope with cravings. It is most effective in person or by phone but is also available in self-help forms (books and Internet websites).  Support groups. These provide emotional support, advice, and guidance for quitting tobacco.  The most effective treatment for TUD is usually a combination of medicine, talk therapy, and support groups. Follow these instructions at home:  Keep all follow-up visits as directed by your health care provider. This is important.  Take medicines only as directed by your health care provider.  Check with your health care provider before starting new prescription or over-the-counter medicines. Contact a health care provider if:  You are not able to take your medicines as prescribed.  Treatment is not helping your TUD and your symptoms get worse. Get help right away if:  You have serious thoughts about hurting yourself or others.  You have  trouble breathing, chest pain, sudden weakness, or sudden numbness in part of your body. This information is not intended to replace advice given to you by your health care provider. Make sure you discuss any questions you have with your health care provider. Document Released: 06/17/2004 Document Revised: 06/14/2016 Document Reviewed: 12/08/2013 Elsevier Interactive Patient Education  2018 [employer]. Chronic Obstructive Pulmonary Disease Chronic obstructive pulmonary disease (COPD) is a common lung condition in which airflow from the lungs is limited. COPD is a general term that can be used to describe many different lung problems that limit airflow, including both chronic bronchitis and emphysema. If you have COPD, your lung function will probably never return to normal, but there are measures you can take to improve lung function and make yourself feel better. What are the causes?  Smoking (common).  Exposure to secondhand smoke.  Genetic problems.  Chronic inflammatory lung diseases or recurrent infections. What are the signs or symptoms?  Shortness of breath, especially with physical activity.  Deep, persistent (chronic) cough with a large amount of thick mucus.  Wheezing.  Rapid breaths (tachypnea).  Gray or bluish discoloration (cyanosis) of the skin, especially in your fingers, toes, or lips.  Fatigue.  Weight loss.  Frequent infections or episodes when breathing symptoms become much worse (exacerbations).  Chest tightness. How is  this diagnosed? Your health care provider will take a medical history and perform a physical examination to diagnose COPD. Additional tests for COPD may include:  Lung (pulmonary) function tests.  Chest X-ray.  CT scan.  Blood tests.  How is this treated? Treatment for COPD may include:  Inhaler and nebulizer medicines. These help manage the symptoms of COPD and make your breathing more comfortable.  Supplemental oxygen.  Supplemental oxygen is only helpful if you have a low oxygen level in your blood.  Exercise and physical activity. These are beneficial for nearly all people with COPD.  Lung surgery or transplant.  Nutrition therapy to gain weight, if you are underweight.  Pulmonary rehabilitation. This may involve working with a team of health care providers and specialists, such as respiratory, occupational, and physical therapists.  Follow these instructions at home:  Take all medicines (inhaled or pills) as directed by your health care provider.  Avoid over-the-counter medicines or cough syrups that dry up your airway (such as antihistamines) and slow down the elimination of secretions unless instructed otherwise by your health care provider.  If you are a smoker, the most important thing that you can do is stop smoking. Continuing to smoke will cause further lung damage and breathing trouble. Ask your health care provider for help with quitting smoking. He or she can direct you to community resources or hospitals that provide support.  Avoid exposure to irritants such as smoke, chemicals, and fumes that aggravate your breathing.  Use oxygen therapy and pulmonary rehabilitation if directed by your health care provider. If you require home oxygen therapy, ask your health care provider whether you should purchase a pulse oximeter to measure your oxygen level at home.  Avoid contact with individuals who have a contagious illness.  Avoid extreme temperature and humidity changes.  Eat healthy foods. Eating smaller, more frequent meals and resting before meals may help you maintain your strength.  Stay active, but balance activity with periods of rest. Exercise and physical activity will help you maintain your ability to do things you want to do.  Preventing infection and hospitalization is very important when you have COPD. Make sure to receive all the vaccines your health care provider recommends,  especially the pneumococcal and influenza vaccines. Ask your health care provider whether you need a pneumonia vaccine.  Learn and use relaxation techniques to manage stress.  Learn and use controlled breathing techniques as directed by your health care provider. Controlled breathing techniques include: 1. Pursed lip breathing. Start by breathing in (inhaling) through your nose for 1 second. Then, purse your lips as if you were going to whistle and breathe out (exhale) through the pursed lips for 2 seconds. 2. Diaphragmatic breathing. Start by putting one hand on your abdomen just above your waist. Inhale slowly through your nose. The hand on your abdomen should move out. Then purse your lips and exhale slowly. You should be able to feel the hand on your abdomen moving in as you exhale.  Learn and use controlled coughing to clear mucus from your lungs. Controlled coughing is a series of short, progressive coughs. The steps of controlled coughing are: 1. Lean your head slightly forward. 2. Breathe in deeply using diaphragmatic breathing. 3. Try to hold your breath for 3 seconds. 4. Keep your mouth slightly open while coughing twice. 5. Spit any mucus out into a tissue. 6. Rest and repeat the steps once or twice as needed. Contact a health care provider if:  You are  coughing up more mucus than usual.  There is a change in the color or thickness of your mucus.  Your breathing is more labored than usual.  Your breathing is faster than usual. Get help right away if:  You have shortness of breath while you are resting.  You have shortness of breath that prevents you from: ? Being able to talk. ? Performing your usual physical activities.  You have chest pain lasting longer than 5 minutes.  Your skin color is more cyanotic than usual.  You measure low oxygen saturations for longer than 5 minutes with a pulse oximeter. This information is not intended to replace advice given to you by  your health care provider. Make sure you discuss any questions you have with your health care provider. Document Released: 07/22/2005 Document Revised: 03/19/2016 Document Reviewed: 06/08/2013 Elsevier Interactive Patient Education  2017 [employer].

## 2017-07-26 ENCOUNTER — Telehealth (INDEPENDENT_AMBULATORY_CARE_PROVIDER_SITE_OTHER): Payer: Self-pay | Admitting: Physician Assistant

## 2017-07-26 NOTE — Telephone Encounter (Signed)
While in office patient requested [facility] that is on file, patient now asking to switch to [facility].

## 2017-07-26 NOTE — Telephone Encounter (Signed)
Patient was here on Thursday and would like all her rx  To be sent to [facility]  . Please, call her and let her know  Thank you

## 2017-07-28 ENCOUNTER — Other Ambulatory Visit (INDEPENDENT_AMBULATORY_CARE_PROVIDER_SITE_OTHER): Payer: Self-pay | Admitting: Physician Assistant

## 2017-07-28 DIAGNOSIS — J441 Chronic obstructive pulmonary disease with (acute) exacerbation: Secondary | ICD-10-CM

## 2017-07-28 MED ORDER — ALBUTEROL SULFATE HFA 108 (90 BASE) MCG/ACT IN AERS: 2.0000 | INHALATION_SPRAY | RESPIRATORY_TRACT | 5 refills | Status: DC | PRN

## 2017-07-28 MED ORDER — IPRATROPIUM-ALBUTEROL 0.5-2.5 (3) MG/3ML IN SOLN: 3.0000 mL | Freq: Four times a day (QID) | RESPIRATORY_TRACT | 1 refills | Status: DC | PRN

## 2017-07-28 MED ORDER — FLUTICASONE FUROATE-VILANTEROL 200-25 MCG/INH IN AEPB: 1.0000 | INHALATION_SPRAY | Freq: Every day | RESPIRATORY_TRACT | 5 refills | Status: DC

## 2017-07-28 MED FILL — !VENTOLIN HFA INHALER: 108 (90 BAS | 16 days supply | Qty: 18 | Fill #0

## 2017-07-28 MED FILL — !BREO ELLIPTA 200-25 MCG: 200-25 | 25 days supply | Qty: 60 | Fill #0

## 2017-07-28 MED FILL — IPRAT-ALBUT 0.5-3(2.5) MG/3: 0.5-2.5 (3) | 30 days supply | Qty: 360 | Fill #0

## 2017-07-28 NOTE — Telephone Encounter (Signed)
I have just sent to [facility] pharmacy.

## 2017-08-19 ENCOUNTER — Ambulatory Visit (INDEPENDENT_AMBULATORY_CARE_PROVIDER_SITE_OTHER): Payer: Self-pay | Admitting: Physician Assistant

## 2017-09-21 MED FILL — !VENTOLIN HFA INHALER: 108 (90 BAS | 16 days supply | Qty: 18 | Fill #1

## 2017-09-21 MED FILL — !BREO ELLIPTA 200-25 MCG: 200-25 | 25 days supply | Qty: 60 | Fill #1

## 2019-10-27 DIAGNOSIS — Z9981 Dependence on supplemental oxygen: Secondary | ICD-10-CM | POA: Insufficient documentation

## 2020-02-21 ENCOUNTER — Inpatient Hospital Stay (HOSPITAL_COMMUNITY): Payer: Medicaid Other

## 2020-02-21 ENCOUNTER — Emergency Department (HOSPITAL_COMMUNITY): Payer: Medicaid Other

## 2020-02-21 ENCOUNTER — Encounter (HOSPITAL_COMMUNITY): Payer: Self-pay | Admitting: Internal Medicine

## 2020-02-21 ENCOUNTER — Other Ambulatory Visit: Payer: Self-pay

## 2020-02-21 ENCOUNTER — Inpatient Hospital Stay (HOSPITAL_COMMUNITY)
Admission: EM | Admit: 2020-02-21 | Discharge: 2020-02-28 | DRG: 286 | Disposition: A | Discharge status: Home / Self Care | Payer: Medicaid Other | Attending: Family Medicine | Admitting: Family Medicine

## 2020-02-21 DIAGNOSIS — Z90710 Acquired absence of both cervix and uterus: Secondary | ICD-10-CM | POA: Diagnosis not present

## 2020-02-21 DIAGNOSIS — R601 Generalized edema: Secondary | ICD-10-CM | POA: Diagnosis not present

## 2020-02-21 DIAGNOSIS — F17210 Nicotine dependence, cigarettes, uncomplicated: Secondary | ICD-10-CM | POA: Diagnosis present

## 2020-02-21 DIAGNOSIS — D860 Sarcoidosis of lung: Secondary | ICD-10-CM | POA: Diagnosis present

## 2020-02-21 DIAGNOSIS — N182 Chronic kidney disease, stage 2 (mild): Secondary | ICD-10-CM | POA: Diagnosis present

## 2020-02-21 DIAGNOSIS — Z20822 Contact with and (suspected) exposure to covid-19: Secondary | ICD-10-CM | POA: Diagnosis present

## 2020-02-21 DIAGNOSIS — R4587 Impulsiveness: Secondary | ICD-10-CM | POA: Diagnosis present

## 2020-02-21 DIAGNOSIS — J441 Chronic obstructive pulmonary disease with (acute) exacerbation: Secondary | ICD-10-CM

## 2020-02-21 DIAGNOSIS — I272 Pulmonary hypertension, unspecified: Secondary | ICD-10-CM | POA: Diagnosis present

## 2020-02-21 DIAGNOSIS — R5381 Other malaise: Secondary | ICD-10-CM | POA: Diagnosis present

## 2020-02-21 DIAGNOSIS — E8809 Other disorders of plasma-protein metabolism, not elsewhere classified: Secondary | ICD-10-CM | POA: Diagnosis present

## 2020-02-21 DIAGNOSIS — R160 Hepatomegaly, not elsewhere classified: Secondary | ICD-10-CM | POA: Diagnosis present

## 2020-02-21 DIAGNOSIS — J9601 Acute respiratory failure with hypoxia: Secondary | ICD-10-CM | POA: Diagnosis present

## 2020-02-21 DIAGNOSIS — I270 Primary pulmonary hypertension: Secondary | ICD-10-CM | POA: Diagnosis not present

## 2020-02-21 DIAGNOSIS — R918 Other nonspecific abnormal finding of lung field: Secondary | ICD-10-CM | POA: Diagnosis present

## 2020-02-21 DIAGNOSIS — J449 Chronic obstructive pulmonary disease, unspecified: Secondary | ICD-10-CM | POA: Diagnosis present

## 2020-02-21 DIAGNOSIS — I509 Heart failure, unspecified: Secondary | ICD-10-CM

## 2020-02-21 DIAGNOSIS — N179 Acute kidney failure, unspecified: Secondary | ICD-10-CM | POA: Diagnosis present

## 2020-02-21 DIAGNOSIS — I313 Pericardial effusion (noninflammatory): Secondary | ICD-10-CM | POA: Diagnosis present

## 2020-02-21 DIAGNOSIS — J4489 Other specified chronic obstructive pulmonary disease: Secondary | ICD-10-CM | POA: Diagnosis present

## 2020-02-21 DIAGNOSIS — R931 Abnormal findings on diagnostic imaging of heart and coronary circulation: Secondary | ICD-10-CM | POA: Diagnosis not present

## 2020-02-21 DIAGNOSIS — R7989 Other specified abnormal findings of blood chemistry: Secondary | ICD-10-CM | POA: Diagnosis not present

## 2020-02-21 DIAGNOSIS — R0602 Shortness of breath: Secondary | ICD-10-CM

## 2020-02-21 DIAGNOSIS — Z9114 Patient's other noncompliance with medication regimen: Secondary | ICD-10-CM

## 2020-02-21 DIAGNOSIS — I2729 Other secondary pulmonary hypertension: Principal | ICD-10-CM | POA: Diagnosis present

## 2020-02-21 DIAGNOSIS — Z7951 Long term (current) use of inhaled steroids: Secondary | ICD-10-CM

## 2020-02-21 DIAGNOSIS — I3139 Other pericardial effusion (noninflammatory): Secondary | ICD-10-CM

## 2020-02-21 DIAGNOSIS — I5031 Acute diastolic (congestive) heart failure: Secondary | ICD-10-CM

## 2020-02-21 DIAGNOSIS — R590 Localized enlarged lymph nodes: Secondary | ICD-10-CM | POA: Diagnosis present

## 2020-02-21 DIAGNOSIS — Z79899 Other long term (current) drug therapy: Secondary | ICD-10-CM

## 2020-02-21 DIAGNOSIS — R0902 Hypoxemia: Secondary | ICD-10-CM

## 2020-02-21 DIAGNOSIS — Z87891 Personal history of nicotine dependence: Secondary | ICD-10-CM | POA: Diagnosis present

## 2020-02-21 DIAGNOSIS — I5033 Acute on chronic diastolic (congestive) heart failure: Secondary | ICD-10-CM | POA: Diagnosis not present

## 2020-02-21 DIAGNOSIS — Z716 Tobacco abuse counseling: Secondary | ICD-10-CM

## 2020-02-21 DIAGNOSIS — E785 Hyperlipidemia, unspecified: Secondary | ICD-10-CM | POA: Diagnosis present

## 2020-02-21 DIAGNOSIS — F1010 Alcohol abuse, uncomplicated: Secondary | ICD-10-CM | POA: Diagnosis present

## 2020-02-21 DIAGNOSIS — E875 Hyperkalemia: Secondary | ICD-10-CM | POA: Diagnosis present

## 2020-02-21 DIAGNOSIS — Z720 Tobacco use: Secondary | ICD-10-CM | POA: Diagnosis not present

## 2020-02-21 DIAGNOSIS — I5032 Chronic diastolic (congestive) heart failure: Secondary | ICD-10-CM | POA: Diagnosis present

## 2020-02-21 DIAGNOSIS — I2609 Other pulmonary embolism with acute cor pulmonale: Secondary | ICD-10-CM | POA: Diagnosis not present

## 2020-02-21 LAB — I-STAT BETA HCG BLOOD, ED (MC, WL, AP ONLY): I-stat hCG, quantitative: <5 m[IU]/mL (ref <5)

## 2020-02-21 LAB — COMPREHENSIVE METABOLIC PANEL
ALT: 21 U/L (ref 0–44)
AST: 33 U/L (ref 15–41)
Albumin: 2.9 g/dL — ABNORMAL LOW (ref 3.5–5.0)
Alkaline Phosphatase: 99 U/L (ref 38–126)
Anion gap: 5 (ref 5–15)
BUN: 19 mg/dL (ref 6–20)
CO2: 22 mmol/L (ref 22–32)
Calcium: 8.3 mg/dL — ABNORMAL LOW (ref 8.9–10.3)
Chloride: 115 mmol/L — ABNORMAL HIGH (ref 98–111)
Creatinine, Ser: 1.35 mg/dL — ABNORMAL HIGH (ref 0.44–1.00)
GFR calc Af Amer: 53 mL/min — ABNORMAL LOW (ref >60)
GFR calc non Af Amer: 45 mL/min — ABNORMAL LOW (ref >60)
Glucose, Bld: 78 mg/dL (ref 70–99)
Potassium: 4.3 mmol/L (ref 3.5–5.1)
Sodium: 142 mmol/L (ref 135–145)
Total Bilirubin: 0.7 mg/dL (ref 0.3–1.2)
Total Protein: 7.3 g/dL (ref 6.5–8.1)

## 2020-02-21 LAB — LIPID PANEL
Cholesterol: 119 mg/dL (ref 0–200)
HDL: 37 mg/dL — ABNORMAL LOW (ref >40)
LDL Cholesterol: 65 mg/dL (ref 0–99)
Total CHOL/HDL Ratio: 3.2 RATIO
Triglycerides: 85 mg/dL (ref <150)
VLDL: 17 mg/dL (ref 0–40)

## 2020-02-21 LAB — CBC WITH DIFFERENTIAL/PLATELET
Abs Immature Granulocytes: 0.03 10*3/uL (ref 0.00–0.07)
Basophils Absolute: 0.1 10*3/uL (ref 0.0–0.1)
Basophils Relative: 1 %
Eosinophils Absolute: 0.1 10*3/uL (ref 0.0–0.5)
Eosinophils Relative: 1 %
HCT: 41.7 % (ref 36.0–46.0)
Hemoglobin: 12.4 g/dL (ref 12.0–15.0)
Immature Granulocytes: 0 %
Lymphocytes Relative: 45 %
Lymphs Abs: 3.5 10*3/uL (ref 0.7–4.0)
MCH: 25.7 pg — ABNORMAL LOW (ref 26.0–34.0)
MCHC: 29.7 g/dL — ABNORMAL LOW (ref 30.0–36.0)
MCV: 86.5 fL (ref 80.0–100.0)
Monocytes Absolute: 0.4 10*3/uL (ref 0.1–1.0)
Monocytes Relative: 6 %
Neutro Abs: 3.6 10*3/uL (ref 1.7–7.7)
Neutrophils Relative %: 47 %
Platelets: 209 10*3/uL (ref 150–400)
RBC: 4.82 MIL/uL (ref 3.87–5.11)
RDW: 18.9 % — ABNORMAL HIGH (ref 11.5–15.5)
WBC: 7.7 10*3/uL (ref 4.0–10.5)
nRBC: 0.4 % — ABNORMAL HIGH (ref 0.0–0.2)

## 2020-02-21 LAB — POC SARS CORONAVIRUS 2 AG -  ED: SARS Coronavirus 2 Ag: NEGATIVE

## 2020-02-21 LAB — PROTIME-INR
INR: 1.2 (ref 0.8–1.2)
Prothrombin Time: 14.9 seconds (ref 11.4–15.2)

## 2020-02-21 LAB — BRAIN NATRIURETIC PEPTIDE: B Natriuretic Peptide: 986.4 pg/mL — ABNORMAL HIGH (ref 0.0–100.0)

## 2020-02-21 LAB — I-STAT CHEM 8, ED
BUN: 27 mg/dL — ABNORMAL HIGH (ref 6–20)
Calcium, Ion: 1.09 mmol/L — ABNORMAL LOW (ref 1.15–1.40)
Chloride: 110 mmol/L (ref 98–111)
Creatinine, Ser: 1.3 mg/dL — ABNORMAL HIGH (ref 0.44–1.00)
Glucose, Bld: 72 mg/dL (ref 70–99)
HCT: 44 % (ref 36.0–46.0)
Hemoglobin: 15 g/dL (ref 12.0–15.0)
Potassium: 6.8 mmol/L (ref 3.5–5.1)
Sodium: 142 mmol/L (ref 135–145)
TCO2: 25 mmol/L (ref 22–32)

## 2020-02-21 LAB — HEMOGLOBIN A1C
Hgb A1c MFr Bld: 6.4 % — ABNORMAL HIGH (ref 4.8–5.6)
Mean Plasma Glucose: 136.98 mg/dL

## 2020-02-21 LAB — APTT: aPTT: 27 seconds (ref 24–36)

## 2020-02-21 LAB — TSH: TSH: 4.561 u[IU]/mL — ABNORMAL HIGH (ref 0.350–4.500)

## 2020-02-21 LAB — MAGNESIUM: Magnesium: 1.8 mg/dL (ref 1.7–2.4)

## 2020-02-21 LAB — POTASSIUM: Potassium: 3.8 mmol/L (ref 3.5–5.1)

## 2020-02-21 LAB — TROPONIN I (HIGH SENSITIVITY)
Troponin I (High Sensitivity): 16 ng/L (ref <18)
Troponin I (High Sensitivity): 15 ng/L (ref <18)

## 2020-02-21 LAB — RESPIRATORY PANEL BY RT PCR (FLU A&B, COVID)
Influenza A by PCR: NEGATIVE
Influenza B by PCR: NEGATIVE
SARS Coronavirus 2 by RT PCR: NEGATIVE

## 2020-02-21 LAB — ECHOCARDIOGRAM COMPLETE
Height: 63 in
Weight: 2292.78 oz

## 2020-02-21 LAB — HIV ANTIBODY (ROUTINE TESTING W REFLEX): HIV Screen 4th Generation wRfx: NONREACTIVE

## 2020-02-21 MED ORDER — BISACODYL 10 MG RE SUPP: 10.0000 mg | Freq: Every day | RECTAL | Status: DC | PRN

## 2020-02-21 MED ORDER — LORAZEPAM 0.5 MG PO TABS
0.5000 mg | ORAL_TABLET | Freq: Four times a day (QID) | ORAL | Status: DC | PRN
  Administered 2020-02-21 – 2020-02-27 (×10): 0.5 mg via ORAL
  Filled 2020-02-21 (×10): qty 1

## 2020-02-21 MED ORDER — ONDANSETRON HCL 4 MG PO TABS: 4.0000 mg | ORAL_TABLET | Freq: Four times a day (QID) | ORAL | Status: DC | PRN

## 2020-02-21 MED ORDER — GUAIFENESIN ER 600 MG PO TB12
600.0000 mg | ORAL_TABLET | Freq: Two times a day (BID) | ORAL | Status: DC
  Administered 2020-02-21 – 2020-02-27 (×11): 600 mg via ORAL
  Filled 2020-02-21 (×17): qty 1

## 2020-02-21 MED ORDER — ALBUTEROL SULFATE HFA 108 (90 BASE) MCG/ACT IN AERS
2.0000 | INHALATION_SPRAY | RESPIRATORY_TRACT | Status: DC | PRN
  Filled 2020-02-21: qty 6.7

## 2020-02-21 MED ORDER — DOCUSATE SODIUM 100 MG PO CAPS
100.0000 mg | ORAL_CAPSULE | Freq: Two times a day (BID) | ORAL | Status: DC
  Administered 2020-02-21 – 2020-02-26 (×8): 100 mg via ORAL
  Filled 2020-02-21 (×13): qty 1

## 2020-02-21 MED ORDER — SODIUM CHLORIDE (PF) 0.9 % IJ SOLN
INTRAMUSCULAR | Status: AC
  Filled 2020-02-21: qty 50

## 2020-02-21 MED ORDER — ACETAMINOPHEN 325 MG PO TABS: 650.0000 mg | ORAL_TABLET | Freq: Four times a day (QID) | ORAL | Status: DC | PRN

## 2020-02-21 MED ORDER — FUROSEMIDE 10 MG/ML IJ SOLN
40.0000 mg | Freq: Once | INTRAMUSCULAR | Status: AC
  Administered 2020-02-21: 40 mg via INTRAVENOUS
  Filled 2020-02-21: qty 4

## 2020-02-21 MED ORDER — FUROSEMIDE 10 MG/ML IJ SOLN
40.0000 mg | Freq: Two times a day (BID) | INTRAMUSCULAR | Status: DC
  Administered 2020-02-22: 11:00:00 40 mg via INTRAVENOUS
  Filled 2020-02-21: qty 4

## 2020-02-21 MED ORDER — IOHEXOL 350 MG/ML SOLN
100.0000 mL | Freq: Once | INTRAVENOUS | Status: AC | PRN
  Administered 2020-02-21: 10:00:00 80 mL via INTRAVENOUS

## 2020-02-21 MED ORDER — IPRATROPIUM-ALBUTEROL 0.5-2.5 (3) MG/3ML IN SOLN: 3.0000 mL | RESPIRATORY_TRACT | Status: DC | PRN

## 2020-02-21 MED ORDER — FLUTICASONE FUROATE-VILANTEROL 200-25 MCG/INH IN AEPB
1.0000 | INHALATION_SPRAY | Freq: Every day | RESPIRATORY_TRACT | Status: DC
  Filled 2020-02-21 (×2): qty 28

## 2020-02-21 MED ORDER — NICOTINE 14 MG/24HR TD PT24
14.0000 mg | MEDICATED_PATCH | Freq: Every day | TRANSDERMAL | Status: DC
  Administered 2020-02-21 – 2020-02-28 (×8): 14 mg via TRANSDERMAL
  Filled 2020-02-21 (×9): qty 1

## 2020-02-21 MED ORDER — POLYETHYLENE GLYCOL 3350 17 G PO PACK: 17.0000 g | PACK | Freq: Every day | ORAL | Status: DC | PRN

## 2020-02-21 MED ORDER — FOLIC ACID 1 MG PO TABS
1.0000 mg | ORAL_TABLET | Freq: Every day | ORAL | Status: DC
  Administered 2020-02-21 – 2020-02-28 (×8): 1 mg via ORAL
  Filled 2020-02-21 (×8): qty 1

## 2020-02-21 MED ORDER — THIAMINE HCL 100 MG PO TABS
100.0000 mg | ORAL_TABLET | Freq: Every day | ORAL | Status: DC
  Administered 2020-02-21 – 2020-02-28 (×8): 100 mg via ORAL
  Filled 2020-02-21 (×8): qty 1

## 2020-02-21 MED ORDER — ONDANSETRON HCL 4 MG/2ML IJ SOLN: 4.0000 mg | Freq: Four times a day (QID) | INTRAMUSCULAR | Status: DC | PRN

## 2020-02-21 MED ORDER — ACETAMINOPHEN 650 MG RE SUPP: 650.0000 mg | Freq: Four times a day (QID) | RECTAL | Status: DC | PRN

## 2020-02-21 MED ORDER — ENOXAPARIN SODIUM 40 MG/0.4ML ~~LOC~~ SOLN
40.0000 mg | SUBCUTANEOUS | Status: DC
  Administered 2020-02-21 – 2020-02-27 (×6): 40 mg via SUBCUTANEOUS
  Filled 2020-02-21 (×8): qty 0.4

## 2020-02-21 NOTE — Progress Notes (Signed)
Echocardiogram 2D Echocardiogram has been performed.  Michele Parrish 02/21/2020, 2:53 PM

## 2020-02-21 NOTE — ED Triage Notes (Addendum)
Pt BIBA from home.   Per EMS-  Pt c/o SHOB/wheezing x2-3 weeks. Pt received 2 breathing tx by EMS earlier in the week.  Pt reports dyspnea with exertion. Wheezing bilaterally.   SpO2 on RA-->80%; 3L O2 [place] --> 96%.   Pt arrives able to speak in full sentences. AOx4, ambulatory on scene. Pt has not received any breathing tx today.   Pt also reports increased swelling to breasts, right more than the left, for appx 3 weeks.

## 2020-02-21 NOTE — H&P (Addendum)
[facility] History and Physical  Michele Parrish QQP:[id number] DOB: May 24, 1966 DOA: 02/21/2020  Referring physician: ED  PCP: Clent Demark, PA-C   Patient is coming from: Home  Chief Complaint: Shortness of breath  HPI: Michele Parrish is a 54 y.o. female with past medical history of chronic bronchitis supposed to be on nebulizers but not on it, presented to hospital with complaints of worsening shortness of breath for the last 3 weeks.  Patient states that has been getting worse recently and was unable to sleep yesterday due to dyspnea suggestive of orthopnea..  She takes few steps and is very short winded.  She has been noticing swelling of her bilateral breasts mostly on the left and her abdomen girth has increased as well.  She also notices increasing leg swelling bilaterally.  Patient currently smokes. Patient denies any chest pain, sputum production, phlegm production.  No fever, chills or rigor.  No urinary urgency, frequency or dysuria.  Denies any changes in her bowel habits.  Denies sick contacts or recent travel.  ED Course: Patient was noted to be hypoxic saturating 80% on room air by EMS.  In the ED, patient complained of dyspnea and was put on supplemental oxygen at 5 L with saturation around 97%.  Patient was then considered for admission to the hospital.   Review of Systems:  All systems were reviewed and were negative unless otherwise mentioned in the HPI  Past Medical History:  Diagnosis Date  . Bronchitis    Past Surgical History:  Procedure Laterality Date  . ABDOMINAL HYSTERECTOMY      Social History:  reports that she has been smoking cigarettes. She has never used smokeless tobacco. She reports current alcohol use. She reports that she does not use drugs.  Patient states that she drinks beer but for the last 6 months she has been drinking twice a week.  No withdrawal symptoms from it.  No Known Allergies  Family history.  Father with history of  prostate cancer.  Prior to Admission medications   Medication Sig Start Date End Date Taking? Authorizing Provider  ipratropium-albuterol (DUONEB) 0.5-2.5 (3) MG/3ML SOLN Take 3 mLs by nebulization every 6 (six) hours as needed. 07/28/17  Yes Clent Demark, PA-C  albuterol (PROVENTIL HFA;VENTOLIN HFA) 108 (90 Base) MCG/ACT inhaler Inhale 2 puffs into the lungs every 4 (four) hours as needed for wheezing or shortness of breath. 07/28/17 08/27/17  Clent Demark, PA-C  fluticasone furoate-vilanterol (BREO ELLIPTA) 200-25 MCG/INH AEPB Inhale 1 puff into the lungs daily. Patient not taking: Reported on 02/21/2020 07/28/17   Clent Demark, PA-C  ibuprofen (ADVIL,MOTRIN) 400 MG tablet Take 1 tablet (400 mg total) by mouth every 8 (eight) hours as needed for pain. Patient not taking: Reported on 07/22/2017 01/13/13   Rosana Hoes, MD    Physical Exam: Vitals:   02/21/20 0812 02/21/20 0813 02/21/20 0832 02/21/20 1020  BP:    (!) 117/103  Pulse:  95  90  Resp:  (!) 27  (!) 25  Temp:  97.8 F (36.6 C)    TempSrc:  Oral    SpO2: 93% 92%  94%  Weight:   65 kg   Height:   5\' 3"  (1.6 m)    Wt Readings from Last 3 Encounters:  02/21/20 65 kg  07/22/17 65.6 kg  11/27/10 81.6 kg   Body mass index is 25.38 kg/m.  General:  Average built, not in obvious distress, on nasal cannula oxygen HENT: Normocephalic,  pupils equally reacting to light and accommodation.  No scleral pallor or icterus noted. Oral mucosa is moist.  Prominent neck veins Chest:    Diminished breath sounds bilaterally.  Basilar crackles noted.  Gross swelling of the breast and abdominal wall noted. CVS: S1 &S2 heard. No murmur.  Regular rate and rhythm.  Mild tachycardia Abdomen: Soft, nontender, distended with abdominal wall edema.  Bowel sounds are heard.   Extremities: No cyanosis, clubbing but biateral lower extremity gross edema pitting.   Psych: Alert, awake and oriented, normal mood CNS:  No cranial nerve deficits.   Power equal in all extremities.  Skin: Warm and dry.  No rashes noted.  Labs on Admission:   CBC: Recent Labs  Lab 02/21/20 0829 02/21/20 0839  WBC 7.7  --   NEUTROABS 3.6  --   HGB 12.4 15.0  HCT 41.7 44.0  MCV 86.5  --   PLT 209  --     Basic Metabolic Panel: Recent Labs  Lab 02/21/20 0829 02/21/20 0839  NA 142 142  K 4.3 6.8*  CL 115* 110  CO2 22  --   GLUCOSE 78 72  BUN 19 27*  CREATININE 1.35* 1.30*  CALCIUM 8.3*  --   MG 1.8  --     Liver Function Tests: Recent Labs  Lab 02/21/20 0829  AST 33  ALT 21  ALKPHOS 99  BILITOT 0.7  PROT 7.3  ALBUMIN 2.9*   No results for input(s): LIPASE, AMYLASE in the last 168 hours. No results for input(s): AMMONIA in the last 168 hours.  Cardiac Enzymes: No results for input(s): CKTOTAL, CKMB, CKMBINDEX, TROPONINI in the last 168 hours.  BNP (last 3 results) Recent Labs    02/21/20 0829  BNP 986.4*    ProBNP (last 3 results) No results for input(s): PROBNP in the last 8760 hours.  CBG: No results for input(s): GLUCAP in the last 168 hours.  Lipase  No results found for: LIPASE    Radiological Exams on Admission: CT Angio Chest PE W and/or Wo Contrast  Result Date: 02/21/2020 CLINICAL DATA:  Shortness of breath with hypoxia. Left lower extremity swelling. Symptoms 2-3 weeks. EXAM: CT ANGIOGRAPHY CHEST WITH CONTRAST TECHNIQUE: Multidetector CT imaging of the chest was performed using the standard protocol during bolus administration of intravenous contrast. Multiplanar CT image reconstructions and MIPs were obtained to evaluate the vascular anatomy. CONTRAST:  19mL OMNIPAQUE IOHEXOL 350 MG/ML SOLN COMPARISON:  09/11/2010 FINDINGS: Cardiovascular: There is mild to moderate cardiomegaly. Small amount of pericardial fluid is present. Mild prominence of the main pulmonary artery segment. No evidence of pulmonary emboli. Thoracic aorta is normal in caliber. Mediastinum/Nodes: Possible 1.4 cm AP window lymph node and  1.1 cm precarinal lymph node. No significant hilar adenopathy. Remaining mediastinal structures are unremarkable. Lungs/Pleura: Examination demonstrates centrilobular emphysematous disease. Linear density over the right base likely atelectasis. Small right pleural effusion is present. Mild linear atelectasis/scarring over the right middle lobe. There are several bilateral subcentimeter pulmonary nodules most subpleural in location with the largest over the lateral right lower lobe measuring 8 mm in greatest diameter (image 101 series 7). Airways are normal. Upper Abdomen: No acute findings. Musculoskeletal: Diffuse subcutaneous edema over the chest. Suggestion of mild bilateral symmetric axillary adenopathy. No focal bony abnormality. Bone island over the lower thoracic spine unchanged. Review of the MIP images confirms the above findings. IMPRESSION: 1.  No evidence of pulmonary embolism. 2.  Small right pleural effusion with bibasilar atelectasis. 3. Multiple  small bilateral subcentimeter pulmonary nodules mostly in subpleural location with the largest measuring 8 mm in greatest dimension over the lateral right lower lobe. Couple possible low-density slightly enlarged mediastinal lymph nodes as well as mild symmetric bilateral axillary adenopathy. Findings may be seen with metastatic disease as recommend clinical correlation and at least follow-up chest CT 3 months. 4. Mild-to-moderate cardiomegaly with small amount of pericardial fluid. Diffuse subcutaneous edema. Electronically Signed   By: Elberta Fortis M.D.   On: 02/21/2020 10:54   DG Chest Portable 1 View  Result Date: 02/21/2020 CLINICAL DATA:  Shortness of breath. EXAM: PORTABLE CHEST 1 VIEW COMPARISON:  November 06, 2010. FINDINGS: Stable cardiomegaly. No pneumothorax or pleural effusion is noted. Both lungs are clear. The visualized skeletal structures are unremarkable. IMPRESSION: No active disease. Electronically Signed   By: Lupita Raider M.D.    On: 02/21/2020 08:45    EKG: Personally reviewed by me which shows normal sinus rhythm.  Assessment/Plan Active Problems:   Acute hypoxemic respiratory failure (HCC)  Acute hypoxic respiratory failure.  Most likely secondary to congestive heart failure, history of COPD with possibility of cor pulmonale. BNP elevated at 986.  Mild pericardial effusion on the CT scan.  Patient does have bilateral lower extremity edema with gross anasarca.   Some crackles over the bases.  Will give 1 dose of IV Lasix today.  Continue Lasix twice a day.  Strict intake and output charting.  Daily weights.  Chest x-ray was unremarkable.  CT scan of the chest without any pulmonary embolism but effusion and multiple subcentimeter pulmonary nodules.  CT Scan of the chest showing mild pericardial effusion.  Will check 2D echocardiogram.  COVID-19 was negative.Initiate CHF protocol. Check troponin. Could consider cardiology/pulmonary in am depending on clinical course..  Mild elevated creatinine at 1.3.  Will try 1 dose of Lasix  today.  Monitor creatinine in a.m. and consider further doses.  Closely monitor electrolytes on diuretics.  Baseline creatinine levels unavailable.  Hyperkalemia.  No EKG changes.  ED provider thinks that it was i-STAT potassium.  Repeat potassium at 3.8.  Multiple pulmonary nodules.  Unsure of etiology.  Could consider pulmonary follow-up.  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  Anasarca, hypoalbuminemia- patient states that she has been eating okay.  We will get a nutritional evaluation.  Check TSH  History of chronic bronchitis. She is supposed to be on nebulizers and inhalers but has not used in a long time.  Will resume while in the hospital.  Chronic nicotine use disorder.  Patient was counseled about it.  Smokes half pack a day.  Will put the patient on nicotine patch while in the hospital.  Alcohol use disorder.  Drinks beer 2 bottles  daily but for for the last 6 months has been drinking twice a week.  No withdrawal symptoms.  We will add thiamine folic acid and Ativan as needed.  Debility, weakness.  Will get PT OT evaluation, CHF protocol.  Medication noncompliance.  Lack of insurance, will get case management consultation.  DVT Prophylaxis: Lovenox subcu  Consultant: None  Code Status: Full code  Microbiology none  Antibiotics: None  Family Communication:  Patients' condition and plan of care including tests being ordered have been discussed with the patient  who indicate understanding and agree with the plan.  Disposition Plan: Home  Severity of Illness: The appropriate patient status for this patient is INPATIENT. Inpatient status is judged  to be reasonable and necessary in order to provide the required intensity of service to ensure the patient's safety. The patient's presenting symptoms, physical exam findings, and initial radiographic and laboratory data in the context of their chronic comorbidities is felt to place them at high risk for further clinical deterioration. Furthermore, it is not anticipated that the patient will be medically stable for discharge from the hospital within 2 midnights of admission. The following factors support the patient status of inpatient.  I certify that at the point of admission it is my clinical judgment that the patient will require inpatient hospital care spanning beyond 2 midnights from the point of admission due to high intensity of service, high risk for further deterioration and high frequency of surveillance required.   Signed, Joycelyn Das, MD [facility] 02/21/2020

## 2020-02-21 NOTE — ED Notes (Signed)
ED TO INPATIENT HANDOFF REPORT  ED Nurse Name and Phone #: Kathrine Haddock, RN  S Name/Age/Gender Michele Parrish 54 y.o. female Room/Bed: WA02/WA02  Code Status   Code Status: Full Code  Home/SNF/Other Home Patient oriented to: self, place, time and situation Is this baseline? Yes   Triage Complete: Triage complete  Chief Complaint Acute hypoxemic respiratory failure (HCC) [J96.01]  Triage Note Pt BIBA from home.   Per EMS-  Pt c/o SHOB/wheezing x2-3 weeks. Pt received 2 breathing tx by EMS earlier in the week.  Pt reports dyspnea with exertion. Wheezing bilaterally.   SpO2 on RA-->80%; 3L O2 [place] --> 96%.   Pt arrives able to speak in full sentences. AOx4, ambulatory on scene. Pt has not received any breathing tx today.   Pt also reports increased swelling to breasts, right more than the left, for appx 3 weeks.     Allergies No Known Allergies  Level of Care/Admitting Diagnosis ED Disposition    ED Disposition Condition Comment   Admit  Hospital Area: [facility] [[id number]]  Level of Care: Telemetry [5]  Admit to tele based on following criteria: Complex arrhythmia (Bradycardia/Tachycardia)  May admit patient to [name] or [facility] if equivalent level of care is available:: Yes  Covid Evaluation: Confirmed COVID Negative  Diagnosis: Acute hypoxemic respiratory failure [facility]) [[id number]]  Admitting Physician: Joycelyn Das [[id number]]  Attending Physician: Joycelyn Das [[id number]]  Estimated length of stay: past midnight tomorrow  Certification:: I certify this patient will need inpatient services for at least 2 midnights       B Medical/Surgery History Past Medical History:  Diagnosis Date  . Bronchitis    Past Surgical History:  Procedure Laterality Date  . ABDOMINAL HYSTERECTOMY       A IV Location/Drains/Wounds Patient Lines/Drains/Airways Status   Active Line/Drains/Airways    Name:   Placement date:   Placement time:   Site:    Days:   Peripheral IV 02/21/20 Anterior;Left Forearm   02/21/20    --    Forearm   less than 1   Urethral Catheter C Simpson, RN Non-latex;Straight-tip 14 Fr.   02/21/20    1538    Non-latex;Straight-tip   less than 1          Intake/Output Last 24 hours  Intake/Output Summary (Last 24 hours) at 02/21/2020 2024 Last data filed at 02/21/2020 1731 Gross per 24 hour  Intake --  Output 2000 ml  Net -2000 ml    Labs/Imaging Results for orders placed or performed during the hospital encounter of 02/21/20 (from the past 48 hour(s))  Magnesium     Status: None   Collection Time: 02/21/20  8:29 AM  Result Value Ref Range   Magnesium 1.8 1.7 - 2.4 mg/dL    Comment: Performed at [facility], [address] [place], [place] [address]  Brain natriuretic peptide (order ONLY if patient c/o SOB)     Status: Abnormal   Collection Time: 02/21/20  8:29 AM  Result Value Ref Range   B Natriuretic Peptide 986.4 (H) 0.0 - 100.0 pg/mL    Comment: Performed at [facility], [address], [place], [place] [address]  Comprehensive metabolic panel     Status: Abnormal   Collection Time: 02/21/20  8:29 AM  Result Value Ref Range   Sodium 142 135 - 145 mmol/L   Potassium 4.3 3.5 - 5.1 mmol/L   Chloride 115 (H) 98 - 111 mmol/L  CO2 22 22 - 32 mmol/L   Glucose, Bld 78 70 - 99 mg/dL    Comment: Glucose reference range applies only to samples taken after fasting for at least 8 hours.   BUN 19 6 - 20 mg/dL   Creatinine, Ser 1.611.35 (H) 0.44 - 1.00 mg/dL   Calcium 8.3 (L) 8.9 - 10.3 mg/dL   Total Protein 7.3 6.5 - 8.1 g/dL   Albumin 2.9 (L) 3.5 - 5.0 g/dL   AST 33 15 - 41 U/L   ALT 21 0 - 44 U/L   Alkaline Phosphatase 99 38 - 126 U/L   Total Bilirubin 0.7 0.3 - 1.2 mg/dL   GFR calc non Af Amer 45 (L) >60 mL/min   GFR calc Af Amer 53 (L) >60 mL/min   Anion gap 5 5 - 15    Comment: Performed at [facility], [address] [place], [place]  [address]  CBC with Differential/Platelet     Status: Abnormal   Collection Time: 02/21/20  8:29 AM  Result Value Ref Range   WBC 7.7 4.0 - 10.5 K/uL   RBC 4.82 3.87 - 5.11 MIL/uL   Hemoglobin 12.4 12.0 - 15.0 g/dL   HCT 54.041.7 98.136.0 - 19.146.0 %   MCV 86.5 80.0 - 100.0 fL   MCH 25.7 (L) 26.0 - 34.0 pg   MCHC 29.7 (L) 30.0 - 36.0 g/dL   RDW 47.818.9 (H) 29.511.5 - 62.115.5 %   Platelets 209 150 - 400 K/uL   nRBC 0.4 (H) 0.0 - 0.2 %   Neutrophils Relative % 47 %   Neutro Abs 3.6 1.7 - 7.7 K/uL   Lymphocytes Relative 45 %   Lymphs Abs 3.5 0.7 - 4.0 K/uL   Monocytes Relative 6 %   Monocytes Absolute 0.4 0.1 - 1.0 K/uL   Eosinophils Relative 1 %   Eosinophils Absolute 0.1 0.0 - 0.5 K/uL   Basophils Relative 1 %   Basophils Absolute 0.1 0.0 - 0.1 K/uL   Immature Granulocytes 0 %   Abs Immature Granulocytes 0.03 0.00 - 0.07 K/uL    Comment: Performed at [facility], [address] [place], [place] [address]  APTT     Status: None   Collection Time: 02/21/20  8:29 AM  Result Value Ref Range   aPTT 27 24 - 36 seconds    Comment: Performed at [facility], [address] [place], [place] [address]  Protime-INR     Status: None   Collection Time: 02/21/20  8:29 AM  Result Value Ref Range   Prothrombin Time 14.9 11.4 - 15.2 seconds   INR 1.2 0.8 - 1.2    Comment: (NOTE) INR goal varies based on device and disease states. Performed at [facility], [address] [place], [place] [address]   I-Stat beta hCG blood, ED     Status: None   Collection Time: 02/21/20  8:38 AM  Result Value Ref Range   I-stat hCG, quantitative <5.0 <5 mIU/mL   Comment 3            Comment:   GEST. AGE      CONC.  (mIU/mL)   <=1 WEEK        5 - 50     2 WEEKS       50 - 500     3 WEEKS       100 - 10,000     4 WEEKS  1,000 - 30,000        FEMALE AND NON-PREGNANT FEMALE:     LESS THAN 5 mIU/mL   I-stat chem 8, ED (not at [facility] or [facility])     Status: Abnormal    Collection Time: 02/21/20  8:39 AM  Result Value Ref Range   Sodium 142 135 - 145 mmol/L   Potassium 6.8 (HH) 3.5 - 5.1 mmol/L   Chloride 110 98 - 111 mmol/L   BUN 27 (H) 6 - 20 mg/dL   Creatinine, Ser 2.95 (H) 0.44 - 1.00 mg/dL   Glucose, Bld 72 70 - 99 mg/dL    Comment: Glucose reference range applies only to samples taken after fasting for at least 8 hours.   Calcium, Ion 1.09 (L) 1.15 - 1.40 mmol/L   TCO2 25 22 - 32 mmol/L   Hemoglobin 15.0 12.0 - 15.0 g/dL   HCT 62.1 30.8 - 65.7 %  POC SARS Coronavirus 2 Ag-ED -     Status: None   Collection Time: 02/21/20  8:50 AM  Result Value Ref Range   SARS Coronavirus 2 Ag NEGATIVE NEGATIVE    Comment: (NOTE) SARS-CoV-2 antigen NOT DETECTED.  Negative results are presumptive.  Negative results do not preclude SARS-CoV-2 infection and should not be used as the sole basis for treatment or other patient management decisions, including infection  control decisions, particularly in the presence of clinical signs and  symptoms consistent with COVID-19, or in those who have been in contact with the virus.  Negative results must be combined with clinical observations, patient history, and epidemiological information. The expected result is Negative. Fact Sheet for Patients: https://sanders-williams.net/ Fact Sheet for Healthcare Providers: https://martinez.com/ This test is not yet approved or cleared by the [place] FDA and  has been authorized for detection and/or diagnosis of SARS-CoV-2 by FDA under an Emergency Use Authorization (EUA).  This EUA will remain in effect (meaning this test can be used) for the duration of  the COVID-19 de claration under Section 564(b)(1) of the Act, 21 U.S.C. section 360bbb-3(b)(1), unless the authorization is terminated or revoked sooner.   Respiratory Panel by RT PCR (Flu A&B, Covid) - Nasopharyngeal Swab     Status: None   Collection Time: 02/21/20 10:18 AM    Specimen: Nasopharyngeal Swab  Result Value Ref Range   SARS Coronavirus 2 by RT PCR NEGATIVE NEGATIVE    Comment: (NOTE) SARS-CoV-2 target nucleic acids are NOT DETECTED. The SARS-CoV-2 RNA is generally detectable in upper respiratoy specimens during the acute phase of infection. The lowest concentration of SARS-CoV-2 viral copies this assay can detect is 131 copies/mL. A negative result does not preclude SARS-Cov-2 infection and should not be used as the sole basis for treatment or other patient management decisions. A negative result may occur with  improper specimen collection/handling, submission of specimen other than nasopharyngeal swab, presence of viral mutation(s) within the areas targeted by this assay, and inadequate number of viral copies (<131 copies/mL). A negative result must be combined with clinical observations, patient history, and epidemiological information. The expected result is Negative. Fact Sheet for Patients:  https://www.moore.com/ Fact Sheet for Healthcare Providers:  https://www.young.biz/ This test is not yet ap proved or cleared by the [place] FDA and  has been authorized for detection and/or diagnosis of SARS-CoV-2 by FDA under an Emergency Use Authorization (EUA). This EUA will remain  in effect (meaning this test can be used) for the duration of the COVID-19 declaration under Section 564(b)(1) of  the Act, 21 U.S.C. section 360bbb-3(b)(1), unless the authorization is terminated or revoked sooner.    Influenza A by PCR NEGATIVE NEGATIVE   Influenza B by PCR NEGATIVE NEGATIVE    Comment: (NOTE) The Xpert Xpress SARS-CoV-2/FLU/RSV assay is intended as an aid in  the diagnosis of influenza from Nasopharyngeal swab specimens and  should not be used as a sole basis for treatment. Nasal washings and  aspirates are unacceptable for Xpert Xpress SARS-CoV-2/FLU/RSV  testing. Fact Sheet for  Patients: https://www.moore.com/ Fact Sheet for Healthcare Providers: https://www.young.biz/ This test is not yet approved or cleared by the [place] FDA and  has been authorized for detection and/or diagnosis of SARS-CoV-2 by  FDA under an Emergency Use Authorization (EUA). This EUA will remain  in effect (meaning this test can be used) for the duration of the  Covid-19 declaration under Section 564(b)(1) of the Act, 21  U.S.C. section 360bbb-3(b)(1), unless the authorization is  terminated or revoked. Performed at [facility], [address], [place], [place] [address]   Potassium     Status: None   Collection Time: 02/21/20  1:16 PM  Result Value Ref Range   Potassium 3.8 3.5 - 5.1 mmol/L    Comment: DELTA CHECK NOTED Performed at [facility], [address] [place], [place] [address]   HIV Antibody (routine testing w rflx)     Status: None   Collection Time: 02/21/20  1:16 PM  Result Value Ref Range   HIV Screen 4th Generation wRfx Non Reactive Non Reactive    Comment: Performed at [facility], [address] [place], [place] [address]  TSH     Status: Abnormal   Collection Time: 02/21/20  1:16 PM  Result Value Ref Range   TSH 4.561 (H) 0.350 - 4.500 uIU/mL    Comment: Performed by a 3rd Generation assay with a functional sensitivity of <=0.01 uIU/mL. Performed at [facility], [address], [place], [place] [address]   Hemoglobin A1c     Status: Abnormal   Collection Time: 02/21/20  1:16 PM  Result Value Ref Range   Hgb A1c MFr Bld 6.4 (H) 4.8 - 5.6 %    Comment: (NOTE) Pre diabetes:          5.7%-6.4% Diabetes:              >6.4% Glycemic control for   <7.0% adults with diabetes    Mean Plasma Glucose 136.98 mg/dL    Comment: Performed at [facility], [address], [place], [place] [address]  Troponin I (High Sensitivity)     Status: None    Collection Time: 02/21/20  1:16 PM  Result Value Ref Range   Troponin I (High Sensitivity) 16 <18 ng/L    Comment: (NOTE) Elevated high sensitivity troponin I (hsTnI) values and significant  changes across serial measurements may suggest ACS but many other  chronic and acute conditions are known to elevate hsTnI results.  Refer to the "Links" section for chest pain algorithms and additional  guidance. Performed at [facility], [address], [place], [place] [address]   Troponin I (High Sensitivity)     Status: None   Collection Time: 02/21/20  4:05 PM  Result Value Ref Range   Troponin I (High Sensitivity) 15 <18 ng/L    Comment: (NOTE) Elevated high sensitivity troponin I (hsTnI) values and significant  changes across serial measurements may suggest ACS but many  other  chronic and acute conditions are known to elevate hsTnI results.  Refer to the "Links" section for chest pain algorithms and additional  guidance. Performed at [facility] [place] [address], [place], [place] [address]   Lipid panel     Status: Abnormal   Collection Time: 02/21/20  4:05 PM  Result Value Ref Range   Cholesterol 119 0 - 200 mg/dL   Triglycerides 85 <150 mg/dL   HDL 37 (L) >40 mg/dL   Total CHOL/HDL Ratio 3.2 RATIO   VLDL 17 0 - 40 mg/dL   LDL Cholesterol 65 0 - 99 mg/dL    Comment:        Total Cholesterol/HDL:CHD Risk Coronary Heart Disease Risk Table                     Men   Women  1/2 Average Risk   3.4   3.3  Average Risk       5.0   4.4  2 X Average Risk   9.6   7.1  3 X Average Risk  23.4   11.0        Use the calculated Patient Ratio above and the CHD Risk Table to determine the patient's CHD Risk.        ATP III CLASSIFICATION (LDL):  <100     mg/dL   Optimal  100-129  mg/dL   Near or Above                    Optimal  130-159  mg/dL   Borderline  160-189  mg/dL   High  >190     mg/dL   Very High Performed at [facility] [address] [place], [place] [address]    CT Angio Chest PE W and/or Wo Contrast  Result Date: 02/21/2020 CLINICAL DATA:  Shortness of breath with hypoxia. Left lower extremity swelling. Symptoms 2-3 weeks. EXAM: CT ANGIOGRAPHY CHEST WITH CONTRAST TECHNIQUE: Multidetector CT imaging of the chest was performed using the standard protocol during bolus administration of intravenous contrast. Multiplanar CT image reconstructions and MIPs were obtained to evaluate the vascular anatomy. CONTRAST:  62mL OMNIPAQUE IOHEXOL 350 MG/ML SOLN COMPARISON:  09/11/2010 FINDINGS: Cardiovascular: There is mild to moderate cardiomegaly. Small amount of pericardial fluid is present. Mild prominence of the main pulmonary artery segment. No evidence of pulmonary emboli. Thoracic aorta is normal in caliber. Mediastinum/Nodes: Possible 1.4 cm AP window lymph node and 1.1 cm precarinal lymph node. No significant hilar adenopathy. Remaining mediastinal structures are unremarkable. Lungs/Pleura: Examination demonstrates centrilobular emphysematous disease. Linear density over the right base likely atelectasis. Small right pleural effusion is present. Mild linear atelectasis/scarring over the right middle lobe. There are several bilateral subcentimeter pulmonary nodules most subpleural in location with the largest over the lateral right lower lobe measuring 8 mm in greatest diameter (image 101 series 7). Airways are normal. Upper Abdomen: No acute findings. Musculoskeletal: Diffuse subcutaneous edema over the chest. Suggestion of mild bilateral symmetric axillary adenopathy. No focal bony abnormality. Bone island over the lower thoracic spine unchanged. Review of the MIP images confirms the above findings. IMPRESSION: 1.  No evidence of pulmonary embolism. 2.  Small right pleural effusion with bibasilar atelectasis. 3. Multiple small bilateral subcentimeter pulmonary nodules mostly in subpleural location with the largest  measuring 8 mm in greatest dimension over the lateral right lower lobe. Couple possible low-density slightly enlarged mediastinal lymph nodes as well as mild  symmetric bilateral axillary adenopathy. Findings may be seen with metastatic disease as recommend clinical correlation and at least follow-up chest CT 3 months. 4. Mild-to-moderate cardiomegaly with small amount of pericardial fluid. Diffuse subcutaneous edema. Electronically Signed   By: Elberta Fortis M.D.   On: 02/21/2020 10:54   DG Chest Portable 1 View  Result Date: 02/21/2020 CLINICAL DATA:  Shortness of breath. EXAM: PORTABLE CHEST 1 VIEW COMPARISON:  November 06, 2010. FINDINGS: Stable cardiomegaly. No pneumothorax or pleural effusion is noted. Both lungs are clear. The visualized skeletal structures are unremarkable. IMPRESSION: No active disease. Electronically Signed   By: Lupita Raider M.D.   On: 02/21/2020 08:45   ECHOCARDIOGRAM COMPLETE  Result Date: 02/21/2020    ECHOCARDIOGRAM REPORT   Patient Name:   Michele Parrish Date of Exam: 02/21/2020 Medical Rec #:  [id number]        Height:       63.0 in Accession #:    [id number]       Weight:       143.3 lb Date of Birth:  [birth year]        BSA:          1.678 m Patient Age:    53 years         BP:           123/91 mmHg Patient Gender: F                HR:           97 bpm. Exam Location:  Inpatient Procedure: 2D Echo, Color Doppler and Cardiac Doppler Indications:    I50.31 Acute diastolic (congestive) heart failure  History:        Patient has no prior history of Echocardiogram examinations.                 COPD.  Sonographer:    Irving Burton Senior RDCS Referring Phys: [phone] [facility] POKHREL IMPRESSIONS  1. Left ventricular ejection fraction, by estimation, is 60 to 65%. The left ventricle has normal function. The left ventricle has no regional wall motion abnormalities. Left ventricular diastolic parameters are consistent with Grade I diastolic dysfunction (impaired relaxation).  2. There appear  to be significant trabeculations in the RV apex. Given RV dysfunction, recommend cardiac MRI to evaluate RV further. Right ventricular systolic function is moderately reduced. The right ventricular size is mildly enlarged. There is severely elevated pulmonary artery systolic pressure. The estimated right ventricular systolic pressure is 64.0 mmHg.  3. Right atrial size was severely dilated.  4. The mitral valve is normal in structure. No evidence of mitral valve regurgitation. No evidence of mitral stenosis.  5. Tricuspid valve regurgitation is moderate.  6. The aortic valve is normal in structure. Aortic valve regurgitation is not visualized. No aortic stenosis is present.  7. The inferior vena cava is dilated in size with <50% respiratory variability, suggesting right atrial pressure of 15 mmHg. FINDINGS  Left Ventricle: Left ventricular ejection fraction, by estimation, is 60 to 65%. The left ventricle has normal function. The left ventricle has no regional wall motion abnormalities. The left ventricular internal cavity size was normal in size. There is  no left ventricular hypertrophy. Left ventricular diastolic parameters are consistent with Grade I diastolic dysfunction (impaired relaxation). Normal left ventricular filling pressure. Right Ventricle: There appear to be significant trabeculations in the RV apex. Given RV dysfunction, recommend cardiac MRI to evaluate RV further. The right ventricular size is mildly enlarged. No  increase in right ventricular wall thickness. Right ventricular systolic function is moderately reduced. There is severely elevated pulmonary artery systolic pressure. The tricuspid regurgitant velocity is 3.50 m/s, and with an assumed right atrial pressure of 15 mmHg, the estimated right ventricular systolic pressure is 64.0 mmHg. Left Atrium: Left atrial size was normal in size. Right Atrium: Right atrial size was severely dilated. Pericardium: A small pericardial effusion is present.  The pericardial effusion is circumferential. Mitral Valve: The mitral valve is normal in structure. Normal mobility of the mitral valve leaflets. No evidence of mitral valve regurgitation. No evidence of mitral valve stenosis. Tricuspid Valve: The tricuspid valve is normal in structure. Tricuspid valve regurgitation is moderate . No evidence of tricuspid stenosis. Aortic Valve: The aortic valve is normal in structure. Aortic valve regurgitation is not visualized. No aortic stenosis is present. Pulmonic Valve: The pulmonic valve was normal in structure. Pulmonic valve regurgitation is trivial. No evidence of pulmonic stenosis. Aorta: The aortic root is normal in size and structure. Venous: The inferior vena cava is dilated in size with less than 50% respiratory variability, suggesting right atrial pressure of 15 mmHg. IAS/Shunts: No atrial level shunt detected by color flow Doppler.  LEFT VENTRICLE PLAX 2D LVIDd:         3.40 cm  Diastology LVIDs:         2.20 cm  LV e' lateral:   9.57 cm/s LV PW:         0.80 cm  LV E/e' lateral: 6.2 LV IVS:        0.90 cm  LV e' medial:    8.05 cm/s LVOT diam:     1.80 cm  LV E/e' medial:  7.4 LV SV:         32 LV SV Index:   19 LVOT Area:     2.54 cm  RIGHT VENTRICLE RV S prime:     8.49 cm/s TAPSE (M-mode): 1.2 cm LEFT ATRIUM             Index       RIGHT ATRIUM           Index LA diam:        3.60 cm 2.15 cm/m  RA Area:     22.00 cm LA Vol (A2C):   47.3 ml 28.19 ml/m RA Volume:   65.30 ml  38.91 ml/m LA Vol (A4C):   28.8 ml 17.16 ml/m LA Biplane Vol: 38.4 ml 22.88 ml/m  AORTIC VALVE LVOT Vmax:   76.10 cm/s LVOT Vmean:  52.300 cm/s LVOT VTI:    0.126 m  AORTA Ao Root diam: 2.60 cm Ao Asc diam:  2.80 cm MITRAL VALVE               TRICUSPID VALVE MV Area (PHT): 2.96 cm    TR Peak grad:   49.0 mmHg MV Decel Time: 256 msec    TR Vmax:        350.00 cm/s MV E velocity: 59.70 cm/s MV A velocity: 78.60 cm/s  SHUNTS MV E/A ratio:  0.76        Systemic VTI:  0.13 m                             Systemic Diam: 1.80 cm Armanda Magic MD Electronically signed by Armanda Magic MD Signature Date/Time: 02/21/2020/3:33:22 PM    Final     Pending Labs Unresulted Labs (From admission, onward)  Start     Ordered   02/22/20 1231  Brain natriuretic peptide  Once,   R     02/21/20 1810   02/22/20 0500  Comprehensive metabolic panel  Tomorrow morning,   R     02/21/20 1810   02/22/20 0500  CBC  Tomorrow morning,   R     02/21/20 1810   02/22/20 0500  Protime-INR  Tomorrow morning,   R     02/21/20 1810   02/22/20 0500  Magnesium  Tomorrow morning,   R     02/21/20 1810   02/22/20 0500  Phosphorus  Tomorrow morning,   R     02/21/20 1810   02/21/20 1248  Urine culture  Once,   STAT     02/21/20 1247          Vitals/Pain Today's Vitals   02/21/20 1730 02/21/20 1900 02/21/20 1930 02/21/20 2000  BP: 122/80 (!) 132/99 (!) 120/91 108/80  Pulse: 86 94 91 93  Resp: (!) 30 13 15  (!) 22  Temp:      TempSrc:      SpO2: 100% 94% 100% 97%  Weight:      Height:      PainSc:        Isolation Precautions No active isolations  Medications Medications  fluticasone furoate-vilanterol (BREO ELLIPTA) 200-25 MCG/INH 1 puff (has no administration in time range)  albuterol (VENTOLIN HFA) 108 (90 Base) MCG/ACT inhaler 2 puff (has no administration in time range)  ipratropium-albuterol (DUONEB) 0.5-2.5 (3) MG/3ML nebulizer solution 3 mL (has no administration in time range)  enoxaparin (LOVENOX) injection 40 mg (has no administration in time range)  docusate sodium (COLACE) capsule 100 mg (has no administration in time range)  polyethylene glycol (MIRALAX / GLYCOLAX) packet 17 g (has no administration in time range)  bisacodyl (DULCOLAX) suppository 10 mg (has no administration in time range)  ondansetron (ZOFRAN) tablet 4 mg (has no administration in time range)    Or  ondansetron (ZOFRAN) injection 4 mg (has no administration in time range)  guaiFENesin (MUCINEX) 12 hr tablet 600  mg (has no administration in time range)  nicotine (NICODERM CQ - dosed in mg/24 hours) patch 14 mg (14 mg Transdermal Patch Applied 02/21/20 1727)  acetaminophen (TYLENOL) tablet 650 mg (has no administration in time range)    Or  acetaminophen (TYLENOL) suppository 650 mg (has no administration in time range)  furosemide (LASIX) injection 40 mg (has no administration in time range)  LORazepam (ATIVAN) tablet 0.5 mg (0.5 mg Oral Given 02/21/20 1921)  thiamine tablet 100 mg (100 mg Oral Given 02/21/20 1921)  folic acid (FOLVITE) tablet 1 mg (1 mg Oral Given 02/21/20 1921)  iohexol (OMNIPAQUE) 350 MG/ML injection 100 mL (80 mLs Intravenous Contrast Given 02/21/20 1000)  sodium chloride (PF) 0.9 % injection (  Given by Other 02/21/20 1812)  furosemide (LASIX) injection 40 mg (40 mg Intravenous Given 02/21/20 1311)    Mobility walks Low fall risk   Focused Assessments Pulmonary Assessment Handoff:  Lung sounds:   O2 Device: Nasal Cannula O2 Flow Rate (L/min): 5 L/min      R Recommendations: See Admitting Provider Note  Report given to:   Additional Notes: N/A

## 2020-02-21 NOTE — ED Notes (Signed)
Pt provided dinner tray.

## 2020-02-21 NOTE — ED Notes (Signed)
Pt tearful, expressing SI without plan.  Pt reports "I have no one, and I know I have a lot going on, its just a lot to deal with. Sometimes I think about it (SI), but I wouldn't do it.  I know Im supposed to be here for some reason."  EDP Nanavati made aware.

## 2020-02-21 NOTE — ED Notes (Signed)
Purewick applied to pt, low suction.

## 2020-02-21 NOTE — ED Notes (Signed)
Lab notified to add on lipid panel to previously drawn light green lab tube.

## 2020-02-21 NOTE — ED Provider Notes (Addendum)
[facility]-EMERGENCY DEPT Provider Note   CSN: [id number] Arrival date & time: 02/21/20  0745     History Chief Complaint  Patient presents with  . Shortness of Breath    Michele Parrish is a 54 y.o. female.  HPI    54 year old female comes in a chief complaint of shortness of breath. Patient has no significant medical history.  She reports that she did have acute respiratory failure several years back that required trach.  Since then she has had no primary breathing problems.  However, 3 weeks ago she started noticing shortness of breath.  Her symptoms have progressed, now just taking couple of steps gets her winded.  Tonight she had worsening of her symptoms so she called EMS.  She has not seen anyone because of lack of insurance.  Patient has no chest pain.  She reports shortness of breath with minimal exertion, and now at rest.  Symptoms are worse when she lays flat.  She has a cough which is dry.  She is also noticed that she has increased swelling over her breast, left worse than right, and increased abdominal girth.  She denies any fevers, chills.  Patient denies any recent illnesses.  She denies any unexplained weight loss, night sweats.  There is no personal history of cancer.  Pt has no hx of PE, DVT and denies any exogenous hormone (testosterone / estrogen) use, long distance travels or surgery in the past 6 weeks, active cancer, recent immobilization.    Past Medical History:  Diagnosis Date  . Bronchitis     Patient Active Problem List   Diagnosis Date Noted  . WEIGHT GAIN, ABNORMAL 11/27/2010  . MALNUTRITION 10/23/2010  . ANEMIA 10/23/2010  . PNEUMONIA 10/23/2010  . COPD 10/23/2010  . RESPIRATORY FAILURE, ACUTE 10/23/2010  . PRESSURE ULCER UNSPECIFIED SITE 10/23/2010  . DELIRIUM 10/23/2010    Past Surgical History:  Procedure Laterality Date  . ABDOMINAL HYSTERECTOMY       OB History   No obstetric history on file.     No  family history on file.  Social History   Tobacco Use  . Smoking status: Current Every Day Smoker    Types: Cigarettes  . Smokeless tobacco: Never Used  . Tobacco comment: 1/2 ppd  Substance Use Topics  . Alcohol use: Yes  . Drug use: No    Home Medications Prior to Admission medications   Medication Sig Start Date End Date Taking? Authorizing Provider  ipratropium-albuterol (DUONEB) 0.5-2.5 (3) MG/3ML SOLN Take 3 mLs by nebulization every 6 (six) hours as needed. 07/28/17  Yes Loletta Specter, PA-C  albuterol (PROVENTIL HFA;VENTOLIN HFA) 108 (90 Base) MCG/ACT inhaler Inhale 2 puffs into the lungs every 4 (four) hours as needed for wheezing or shortness of breath. 07/28/17 08/27/17  Loletta Specter, PA-C  fluticasone furoate-vilanterol (BREO ELLIPTA) 200-25 MCG/INH AEPB Inhale 1 puff into the lungs daily. Patient not taking: Reported on 02/21/2020 07/28/17   Loletta Specter, PA-C  ibuprofen (ADVIL,MOTRIN) 400 MG tablet Take 1 tablet (400 mg total) by mouth every 8 (eight) hours as needed for pain. Patient not taking: Reported on 07/22/2017 01/13/13   Jimmie Molly, MD    Allergies    Patient has no known allergies.  Review of Systems   Review of Systems  Constitutional: Positive for activity change.  Respiratory: Positive for shortness of breath.   Cardiovascular: Negative for chest pain.  Gastrointestinal: Positive for abdominal distention. Negative for nausea and vomiting.  Neurological: Positive for dizziness.  Hematological: Does not bruise/bleed easily.  All other systems reviewed and are negative.   Physical Exam Updated Vital Signs BP (!) 117/103   Pulse 90   Temp 97.8 F (36.6 C) (Oral)   Resp (!) 25   Ht 5\' 3"  (1.6 m)   Wt 65 kg   SpO2 94%   BMI 25.38 kg/m   Physical Exam Vitals and nursing note reviewed.  Constitutional:      Appearance: She is well-developed.  HENT:     Head: Normocephalic and atraumatic.  Eyes:     Pupils: Pupils are equal,  round, and reactive to light.  Cardiovascular:     Rate and Rhythm: Regular rhythm. Tachycardia present.     Heart sounds: Normal heart sounds. No murmur.  Pulmonary:     Effort: Tachypnea, accessory muscle usage and respiratory distress present.     Breath sounds: Examination of the right-lower field reveals rales. Examination of the left-lower field reveals rales. Rales present.  Abdominal:     General: There is no distension.     Palpations: Abdomen is soft.     Tenderness: There is no abdominal tenderness. There is no guarding or rebound.  Musculoskeletal:     Cervical back: Neck supple.  Skin:    General: Skin is warm and dry.  Neurological:     Mental Status: She is alert and oriented to person, place, and time.     ED Results / Procedures / Treatments   Labs (all labs ordered are listed, but only abnormal results are displayed) Labs Reviewed  BRAIN NATRIURETIC PEPTIDE - Abnormal; Notable for the following components:      Result Value   B Natriuretic Peptide 986.4 (*)    All other components within normal limits  COMPREHENSIVE METABOLIC PANEL - Abnormal; Notable for the following components:   Chloride 115 (*)    Creatinine, Ser 1.35 (*)    Calcium 8.3 (*)    Albumin 2.9 (*)    GFR calc non Af Amer 45 (*)    GFR calc Af Amer 53 (*)    All other components within normal limits  CBC WITH DIFFERENTIAL/PLATELET - Abnormal; Notable for the following components:   MCH 25.7 (*)    MCHC 29.7 (*)    RDW 18.9 (*)    nRBC 0.4 (*)    All other components within normal limits  I-STAT CHEM 8, ED - Abnormal; Notable for the following components:   Potassium 6.8 (*)    BUN 27 (*)    Creatinine, Ser 1.30 (*)    Calcium, Ion 1.09 (*)    All other components within normal limits  RESPIRATORY PANEL BY RT PCR (FLU A&B, COVID)  MAGNESIUM  APTT  PROTIME-INR  I-STAT BETA HCG BLOOD, ED ([facility], [facility], [facility] ONLY)  POC SARS CORONAVIRUS 2 AG -  ED    EKG None  ED ECG REPORT   Date:  02/21/2020  Rate: 94  Rhythm: normal sinus rhythm  QRS Axis: normal  Intervals: normal besides prolong QTc of 560  ST/T Wave abnormalities: nonspecific ST/T changes  Conduction Disutrbances:none  Narrative Interpretation:   Old EKG Reviewed: none available  I have personally reviewed the EKG tracing and agree with the computerized printout as noted.   Radiology CT Angio Chest PE W and/or Wo Contrast  Result Date: 02/21/2020 CLINICAL DATA:  Shortness of breath with hypoxia. Left lower extremity swelling. Symptoms 2-3 weeks. EXAM: CT ANGIOGRAPHY CHEST WITH CONTRAST TECHNIQUE:  Multidetector CT imaging of the chest was performed using the standard protocol during bolus administration of intravenous contrast. Multiplanar CT image reconstructions and MIPs were obtained to evaluate the vascular anatomy. CONTRAST:  24mL OMNIPAQUE IOHEXOL 350 MG/ML SOLN COMPARISON:  09/11/2010 FINDINGS: Cardiovascular: There is mild to moderate cardiomegaly. Small amount of pericardial fluid is present. Mild prominence of the main pulmonary artery segment. No evidence of pulmonary emboli. Thoracic aorta is normal in caliber. Mediastinum/Nodes: Possible 1.4 cm AP window lymph node and 1.1 cm precarinal lymph node. No significant hilar adenopathy. Remaining mediastinal structures are unremarkable. Lungs/Pleura: Examination demonstrates centrilobular emphysematous disease. Linear density over the right base likely atelectasis. Small right pleural effusion is present. Mild linear atelectasis/scarring over the right middle lobe. There are several bilateral subcentimeter pulmonary nodules most subpleural in location with the largest over the lateral right lower lobe measuring 8 mm in greatest diameter (image 101 series 7). Airways are normal. Upper Abdomen: No acute findings. Musculoskeletal: Diffuse subcutaneous edema over the chest. Suggestion of mild bilateral symmetric axillary adenopathy. No focal bony abnormality. Bone island  over the lower thoracic spine unchanged. Review of the MIP images confirms the above findings. IMPRESSION: 1.  No evidence of pulmonary embolism. 2.  Small right pleural effusion with bibasilar atelectasis. 3. Multiple small bilateral subcentimeter pulmonary nodules mostly in subpleural location with the largest measuring 8 mm in greatest dimension over the lateral right lower lobe. Couple possible low-density slightly enlarged mediastinal lymph nodes as well as mild symmetric bilateral axillary adenopathy. Findings may be seen with metastatic disease as recommend clinical correlation and at least follow-up chest CT 3 months. 4. Mild-to-moderate cardiomegaly with small amount of pericardial fluid. Diffuse subcutaneous edema. Electronically Signed   By: Marin Olp M.D.   On: 02/21/2020 10:54   DG Chest Portable 1 View  Result Date: 02/21/2020 CLINICAL DATA:  Shortness of breath. EXAM: PORTABLE CHEST 1 VIEW COMPARISON:  November 06, 2010. FINDINGS: Stable cardiomegaly. No pneumothorax or pleural effusion is noted. Both lungs are clear. The visualized skeletal structures are unremarkable. IMPRESSION: No active disease. Electronically Signed   By: Marijo Conception M.D.   On: 02/21/2020 08:45    Procedures .Critical Care Performed by: Varney Biles, MD Authorized by: Varney Biles, MD   Critical care provider statement:    Critical care time (minutes):  82   Critical care was necessary to treat or prevent imminent or life-threatening deterioration of the following conditions:  Respiratory failure   Critical care was time spent personally by me on the following activities:  Discussions with consultants, evaluation of patient's response to treatment, examination of patient, ordering and performing treatments and interventions, ordering and review of laboratory studies, ordering and review of radiographic studies, pulse oximetry, re-evaluation of patient's condition, obtaining history from patient or  surrogate and review of old charts   (including critical care time)  Medications Ordered in ED Medications  sodium chloride (PF) 0.9 % injection (has no administration in time range)  iohexol (OMNIPAQUE) 350 MG/ML injection 100 mL (80 mLs Intravenous Contrast Given 02/21/20 1000)    ED Course  I have reviewed the triage vital signs and the nursing notes.  Pertinent labs & imaging results that were available during my care of the patient were reviewed by me and considered in my medical decision making (see chart for details).  Clinical Course as of Feb 21 1215  Wed Feb 21, 2020  0930 On reassessment patient's respiratory rate has come down to the mid 20s.  She is appearing more comfortable.  She is on 5 L of oxygen with 94% O2 sat.   [AN]  1105 CT results do not indicate PE.  She does have multiple pulmonary nodules and concerns for possible metastases. Ready for admission.  CT Angio Chest PE W and/or Wo Contrast [AN]    Clinical Course User Index [AN] Derwood Kaplan, MD   MDM Rules/Calculators/A&P                      Pt comes in with cc of shortness of breath. Patient is noted to be hypoxic and in respiratory distress.  She is requiring 5 L of oxygen right now.  She is breathing over 30 times and is tachycardic.  On exam clearly she has pitting edema, that appears to be worse on the left side compared to right.  My clinical concerns are that patient likely has pleural effusion, and that there could be an underlying malignancy.  Exam is not revealing any rhonchi.  Symptoms have been ongoing for several days, therefore this is unlikely to be a primary pulmonary issue, rather a circulatory issue, or a sequela of the primary pulmonary issue that is now manifesting itself as a circulatory issue -  especially given the pitting edema.  Patient denies any COVID-19 exposure COVID-19-like symptoms.  Post COVID-19 syndrome is in the differential.  Appropriate work-up initiated.  Patient will  need admission to the hospital.   The patient appears reasonably screened and/or stabilized for discharge and I doubt any other medical condition or other [facility] requiring further screening, evaluation, or treatment in the ED at this time prior to discharge.   Results from the ER workup discussed with the patient face to face and all questions answered to the best of my ability. The patient is safe for discharge with strict return precautions.   Mikalyn E Halabi was evaluated in Emergency Department on 02/21/2020 for the symptoms described in the history of present illness. She was evaluated in the context of the global COVID-19 pandemic, which necessitated consideration that the patient might be at risk for infection with the SARS-CoV-2 virus that causes COVID-19. Institutional protocols and algorithms that pertain to the evaluation of patients at risk for COVID-19 are in a state of rapid change based on information released by regulatory bodies including the CDC and federal and state organizations. These policies and algorithms were followed during the patient's care in the ED.   Final Clinical Impression(s) / ED Diagnoses Final diagnoses:  Acute hypoxemic respiratory failure [facility])    Rx / DC Orders ED Discharge Orders    None          Derwood Kaplan, MD 02/21/20 1216

## 2020-02-21 NOTE — ED Notes (Signed)
Pt O2 sats decreased to 82% on room air, good waveform noted.  Pt had taken off nasal cannula in her sleep.  Nasal cannula reapplied, O2 sats increased to 95% on 5 L O2.

## 2020-02-22 ENCOUNTER — Encounter (HOSPITAL_COMMUNITY): Payer: Self-pay | Admitting: Internal Medicine

## 2020-02-22 DIAGNOSIS — N179 Acute kidney failure, unspecified: Secondary | ICD-10-CM | POA: Diagnosis present

## 2020-02-22 DIAGNOSIS — Z87891 Personal history of nicotine dependence: Secondary | ICD-10-CM | POA: Diagnosis present

## 2020-02-22 DIAGNOSIS — J9601 Acute respiratory failure with hypoxia: Secondary | ICD-10-CM

## 2020-02-22 DIAGNOSIS — I5032 Chronic diastolic (congestive) heart failure: Secondary | ICD-10-CM | POA: Diagnosis present

## 2020-02-22 DIAGNOSIS — R601 Generalized edema: Secondary | ICD-10-CM | POA: Diagnosis present

## 2020-02-22 DIAGNOSIS — R5381 Other malaise: Secondary | ICD-10-CM

## 2020-02-22 DIAGNOSIS — R918 Other nonspecific abnormal finding of lung field: Secondary | ICD-10-CM | POA: Diagnosis present

## 2020-02-22 DIAGNOSIS — I509 Heart failure, unspecified: Secondary | ICD-10-CM

## 2020-02-22 DIAGNOSIS — R931 Abnormal findings on diagnostic imaging of heart and coronary circulation: Secondary | ICD-10-CM

## 2020-02-22 DIAGNOSIS — I272 Pulmonary hypertension, unspecified: Secondary | ICD-10-CM | POA: Diagnosis present

## 2020-02-22 DIAGNOSIS — I5031 Acute diastolic (congestive) heart failure: Secondary | ICD-10-CM | POA: Diagnosis present

## 2020-02-22 DIAGNOSIS — Z720 Tobacco use: Secondary | ICD-10-CM | POA: Diagnosis present

## 2020-02-22 HISTORY — DX: Heart failure, unspecified: I50.9

## 2020-02-22 LAB — MAGNESIUM: Magnesium: 1.6 mg/dL — ABNORMAL LOW (ref 1.7–2.4)

## 2020-02-22 LAB — COMPREHENSIVE METABOLIC PANEL
ALT: 20 U/L (ref 0–44)
AST: 25 U/L (ref 15–41)
Albumin: 2.7 g/dL — ABNORMAL LOW (ref 3.5–5.0)
Alkaline Phosphatase: 90 U/L (ref 38–126)
Anion gap: 5 (ref 5–15)
BUN: 17 mg/dL (ref 6–20)
CO2: 25 mmol/L (ref 22–32)
Calcium: 8 mg/dL — ABNORMAL LOW (ref 8.9–10.3)
Chloride: 110 mmol/L (ref 98–111)
Creatinine, Ser: 1.08 mg/dL — ABNORMAL HIGH (ref 0.44–1.00)
GFR calc Af Amer: >60 mL/min (ref >60)
GFR calc non Af Amer: 59 mL/min — ABNORMAL LOW (ref >60)
Glucose, Bld: 92 mg/dL (ref 70–99)
Potassium: 4 mmol/L (ref 3.5–5.1)
Sodium: 140 mmol/L (ref 135–145)
Total Bilirubin: 0.9 mg/dL (ref 0.3–1.2)
Total Protein: 6.7 g/dL (ref 6.5–8.1)

## 2020-02-22 LAB — POCT I-STAT, CHEM 8
BUN: 27 mg/dL — ABNORMAL HIGH (ref 6–20)
Calcium, Ion: 1.09 mmol/L — ABNORMAL LOW (ref 1.15–1.40)
Chloride: 110 mmol/L (ref 98–111)
Creatinine, Ser: 1.3 mg/dL — ABNORMAL HIGH (ref 0.44–1.00)
Glucose, Bld: 72 mg/dL (ref 70–99)
HCT: 44 % (ref 36.0–46.0)
Hemoglobin: 15 g/dL (ref 12.0–15.0)
Potassium: 6.8 mmol/L (ref 3.5–5.1)
Sodium: 142 mmol/L (ref 135–145)
TCO2: 25 mmol/L (ref 22–32)

## 2020-02-22 LAB — PROTIME-INR
INR: 1.2 (ref 0.8–1.2)
Prothrombin Time: 14.9 seconds (ref 11.4–15.2)

## 2020-02-22 LAB — PHOSPHORUS: Phosphorus: 4.2 mg/dL (ref 2.5–4.6)

## 2020-02-22 LAB — CBC
HCT: 38.4 % (ref 36.0–46.0)
Hemoglobin: 11.4 g/dL — ABNORMAL LOW (ref 12.0–15.0)
MCH: 25.6 pg — ABNORMAL LOW (ref 26.0–34.0)
MCHC: 29.7 g/dL — ABNORMAL LOW (ref 30.0–36.0)
MCV: 86.1 fL (ref 80.0–100.0)
Platelets: 184 10*3/uL (ref 150–400)
RBC: 4.46 MIL/uL (ref 3.87–5.11)
RDW: 18.3 % — ABNORMAL HIGH (ref 11.5–15.5)
WBC: 6.9 10*3/uL (ref 4.0–10.5)
nRBC: 0.3 % — ABNORMAL HIGH (ref 0.0–0.2)

## 2020-02-22 LAB — URINE CULTURE: Culture: NO GROWTH

## 2020-02-22 LAB — BRAIN NATRIURETIC PEPTIDE: B Natriuretic Peptide: 846.6 pg/mL — ABNORMAL HIGH (ref 0.0–100.0)

## 2020-02-22 MED ORDER — ENSURE ENLIVE PO LIQD
237.0000 mL | Freq: Two times a day (BID) | ORAL | Status: DC
  Administered 2020-02-23 – 2020-02-28 (×9): 237 mL via ORAL

## 2020-02-22 MED ORDER — ADULT MULTIVITAMIN W/MINERALS CH
1.0000 | ORAL_TABLET | Freq: Every day | ORAL | Status: DC
  Administered 2020-02-22 – 2020-02-28 (×7): 1 via ORAL
  Filled 2020-02-22 (×8): qty 1

## 2020-02-22 MED ORDER — MAGNESIUM SULFATE 4 GM/100ML IV SOLN
4.0000 g | Freq: Once | INTRAVENOUS | Status: AC
  Administered 2020-02-22: 11:00:00 4 g via INTRAVENOUS
  Filled 2020-02-22: qty 100

## 2020-02-22 MED ORDER — CHLORHEXIDINE GLUCONATE CLOTH 2 % EX PADS
6.0000 | MEDICATED_PAD | Freq: Every day | CUTANEOUS | Status: DC
  Administered 2020-02-22 – 2020-02-26 (×4): 6 via TOPICAL

## 2020-02-22 MED ORDER — FUROSEMIDE 10 MG/ML IJ SOLN
80.0000 mg | Freq: Two times a day (BID) | INTRAMUSCULAR | Status: DC
  Administered 2020-02-22: 80 mg via INTRAVENOUS
  Filled 2020-02-22: qty 8

## 2020-02-22 MED ORDER — FUROSEMIDE 10 MG/ML IJ SOLN
40.0000 mg | Freq: Once | INTRAMUSCULAR | Status: AC
  Administered 2020-02-22: 13:00:00 40 mg via INTRAVENOUS
  Filled 2020-02-22: qty 4

## 2020-02-22 NOTE — Progress Notes (Signed)
PROGRESS NOTE    Michele Parrish  PIR:[id number] DOB: 1966-02-09 DOA: 02/21/2020 PCP: Loletta Specter, PA-C (Confirm with patient/family/NH records and if not entered, this HAS to be entered at [facility] point of entry. "No PCP" if truly none.)   Chief Complaint  Patient presents with  . Shortness of Breath    Brief Narrative:  HPI per Dr. Andi Devon is a 54 y.o. female with past medical history of chronic bronchitis supposed to be on nebulizers but not on it, presented to hospital with complaints of worsening shortness of breath for the last 3 weeks.  Patient stated that has been getting worse recently and was unable to sleep yesterday due to dyspnea suggestive of orthopnea..  She takes few steps and is very short winded.  She has been noticing swelling of her bilateral breasts mostly on the left and her abdomen girth has increased as well.  She also notices increasing leg swelling bilaterally.  Patient currently smokes. Patient denied any chest pain, sputum production, phlegm production.  No fever, chills or rigor.  No urinary urgency, frequency or dysuria.  Denies any changes in her bowel habits.  Denies sick contacts or recent travel.  ED Course: Patient was noted to be hypoxic saturating 80% on room air by EMS.  In the ED, patient complained of dyspnea and was put on supplemental oxygen at 5 L with saturation around 97%.  Patient was then considered for admission to the hospital.    Assessment & Plan:   Principal Problem:   Acute hypoxemic respiratory failure (HCC) Active Problems:   Acute diastolic CHF (congestive heart failure) (HCC)   Acute exacerbation of CHF (congestive heart failure) (HCC)   Moderate to severe pulmonary hypertension (HCC)   COPD with chronic bronchitis (HCC)   Hypomagnesemia   AKI (acute kidney injury) (HCC)   Anasarca   Tobacco abuse   Pulmonary nodules   Debility  1 acute hypoxic respiratory failure secondary to acute diastolic heart  failure/right heart failure in the setting of severe pulmonary hypertension Likely secondary to acute diastolic heart failure/right heart failure in the setting of severe pulmonary hypertension possibly cor pulmonale with history of COPD.  BNP noted to be elevated on admission at 986 and down to 846.  Cardiac enzymes negative.  2D echo with EF of 60 to 65%, no wall motion abnormalities, grade 1 diastolic dysfunction, significant trabeculations in the right ventricular apex, right ventricular dysfunction, right ventricle size mildly enlarged, severe elevated pulmonary artery systolic pressure with estimated right ventricular systolic pressure of 64 mmHg.  Right atrial size severely dilated.  Patient speaking in choppy sentences and on 4 to 5 L nasal cannula.  Patient with anasarca with lower extremity edema.  CT angiogram chest negative for PE, small right pleural effusion with bibasilar atelectasis, multiple small bilateral subcentimeter pulmonary nodules mostly in subpleural location, mild to moderate cardiomegaly with small amount of pericardial fluid.  Diffuse subcutaneous edema.  Patient with a urine output of 3.4 L over the past 24 hours and patient is -7.650 L during this hospitalization.  Current weight of 161.82 pounds from 162.26 pounds on admission.  TSH slightly elevated at 4.561.  Patient currently on Lasix 40 mg IV every 12 hours.  Due to abnormal 2D echo, severe pulmonary hypertension, anasarca concerned that patient may need further cardiac evaluation with probable right heart cath and possible cardiac MRI and as such cardiology consulted.  Patient seen in consultation by cardiology who have increased Lasix to  80 mg twice daily and recommending transfer to [facility] for right heart cath tomorrow and cardiac MRI.  Appreciate cardiology input and recommendations.  2.  Acute kidney injury Likely secondary to problem #1.  Renal function improving with diuresis.  Follow.  3.   Hypomagnesemia Magnesium sulfate 4 g IV x1.  4.  Multiple pulmonary nodules Questionable etiology.  Will likely need outpatient follow-up with pulmonary with repeat CT scan in about 3 months.  5.  Anasarca/hypoalbuminemia Questionable etiology.  TSH slightly elevated.  Follow.  6.  History of chronic bronchitis/copd Patient supposedly was supposed to be on nebulizers and inhalers but has not used them in a while.  Patient placed on nebs.  On Mucinex.  Follow.  7.  Tobacco abuse Tobacco cessation.  Continue nicotine patch.  8.  Alcohol use disorder Patient with no signs or symptoms of withdrawal.  Continue thiamine, folic acid, multivitamin.  On Ativan as needed.  9.  Debility/weakness PT/OT.    DVT prophylaxis: Lovenox Code Status: Full Family Communication: Updated patient.  No family at bedside. Disposition:   Status is: Inpatient    Dispo: The patient is from: Home              Anticipated d/c is to: Likely home.              Anticipated d/c date is: To be determined.              Patient currently on IV Lasix, significantly volume overloaded on examination, mild acute respiratory distress, abnormal 2D echo being evaluated by cardiology who are requesting patient be transferred to [facility] for probable right heart cath.        Consultants:   Cardiology: Dr. Percival Spanish 02/22/2020  Procedures:   2D echo 02/21/2020  Chest x-ray 02/21/2020  CT angiogram chest 02/21/2020  Antimicrobials:   None   Subjective: Patient sitting up in chair states just finished her lunch.  States get significantly short of breath on minimal exertion.  Denies any chest pain.  States bilateral breast is swollen possibly with fluid.  States lower extremity edema slightly improved from admission.  States has had significant urine output after being started on Lasix.  Denies any nausea or emesis.  Patient on 4-1/2 to 5 L O2 nasal cannula.  Objective: Vitals:   02/22/20 0054 02/22/20  0636 02/22/20 0838 02/22/20 1358  BP: 102/70 110/76 131/84 91/60  Pulse: 92 88 90 90  Resp: 20 18 16 18   Temp: 98.1 F (36.7 C) 98 F (36.7 C) 98.2 F (36.8 C) 98.7 F (37.1 C)  TempSrc: Oral Oral Oral Oral  SpO2: 96% 99% 94% 94%  Weight:  73.4 kg    Height:        Intake/Output Summary (Last 24 hours) at 02/22/2020 1700 Last data filed at 02/22/2020 1610 Gross per 24 hour  Intake --  Output 7650 ml  Net -7650 ml   Filed Weights   02/21/20 0832 02/21/20 2056 02/22/20 0636  Weight: 65 kg 73.6 kg 73.4 kg    Examination:  General exam: Slightly visibly short of breath.  Sitting up in chair.  Edema in bilateral breasts. Respiratory system: Diffuse crackles noted.  Decreased breath sounds in the bases.  No wheezing.  Visibly short of breath.  Speaking in choppy sentences.  Cardiovascular system: S1 & S2 heard, RRR.  Positive JVD.  No murmurs rubs or gallops.  2+ bilateral lower extremity edema.  Gastrointestinal system: Abdomen is nondistended, soft  and nontender. No organomegaly or masses felt. Normal bowel sounds heard. Central nervous system: Alert and oriented. No focal neurological deficits. Extremities: 2+ bilateral lower extremity edema.   Skin: No rashes, lesions or ulcers Psychiatry: Judgement and insight appear normal. Mood & affect appropriate.     Data Reviewed: I have personally reviewed following labs and imaging studies  CBC: Recent Labs  Lab 02/21/20 0829 02/21/20 0839 02/22/20 0549  WBC 7.7  --  6.9  NEUTROABS 3.6  --   --   HGB 12.4 15.0  15.0 11.4*  HCT 41.7 44.0  44.0 38.4  MCV 86.5  --  86.1  PLT 209  --  184    Basic Metabolic Panel: Recent Labs  Lab 02/21/20 0829 02/21/20 0839 02/21/20 1316 02/22/20 0549  NA 142 142  142  --  140  K 4.3 6.8*  6.8* 3.8 4.0  CL 115* 110  110  --  110  CO2 22  --   --  25  GLUCOSE 78 72  72  --  92  BUN 19 27*  27*  --  17  CREATININE 1.35* 1.30*  1.30*  --  1.08*  CALCIUM 8.3*  --   --  8.0*   MG 1.8  --   --  1.6*  PHOS  --   --   --  4.2    GFR: Estimated Creatinine Clearance: 59.2 mL/min (A) (by C-G formula based on SCr of 1.08 mg/dL (H)).  Liver Function Tests: Recent Labs  Lab 02/21/20 0829 02/22/20 0549  AST 33 25  ALT 21 20  ALKPHOS 99 90  BILITOT 0.7 0.9  PROT 7.3 6.7  ALBUMIN 2.9* 2.7*    CBG: No results for input(s): GLUCAP in the last 168 hours.   Recent Results (from the past 240 hour(s))  Respiratory Panel by RT PCR (Flu A&B, Covid) - Nasopharyngeal Swab     Status: None   Collection Time: 02/21/20 10:18 AM   Specimen: Nasopharyngeal Swab  Result Value Ref Range Status   SARS Coronavirus 2 by RT PCR NEGATIVE NEGATIVE Final    Comment: (NOTE) SARS-CoV-2 target nucleic acids are NOT DETECTED. The SARS-CoV-2 RNA is generally detectable in upper respiratoy specimens during the acute phase of infection. The lowest concentration of SARS-CoV-2 viral copies this assay can detect is 131 copies/mL. A negative result does not preclude SARS-Cov-2 infection and should not be used as the sole basis for treatment or other patient management decisions. A negative result may occur with  improper specimen collection/handling, submission of specimen other than nasopharyngeal swab, presence of viral mutation(s) within the areas targeted by this assay, and inadequate number of viral copies (<131 copies/mL). A negative result must be combined with clinical observations, patient history, and epidemiological information. The expected result is Negative. Fact Sheet for Patients:  https://www.moore.com/https://www.fda.gov/media/142436/download Fact Sheet for Healthcare Providers:  https://www.young.biz/https://www.fda.gov/media/142435/download This test is not yet ap proved or cleared by the [place] FDA and  has been authorized for detection and/or diagnosis of SARS-CoV-2 by FDA under an Emergency Use Authorization (EUA). This EUA will remain  in effect (meaning this test can be used) for the duration  of the COVID-19 declaration under Section 564(b)(1) of the Act, 21 U.S.C. section 360bbb-3(b)(1), unless the authorization is terminated or revoked sooner.    Influenza A by PCR NEGATIVE NEGATIVE Final   Influenza B by PCR NEGATIVE NEGATIVE Final    Comment: (NOTE) The Xpert Xpress SARS-CoV-2/FLU/RSV assay is intended as an  aid in  the diagnosis of influenza from Nasopharyngeal swab specimens and  should not be used as a sole basis for treatment. Nasal washings and  aspirates are unacceptable for Xpert Xpress SARS-CoV-2/FLU/RSV  testing. Fact Sheet for Patients: https://www.moore.com/ Fact Sheet for Healthcare Providers: https://www.young.biz/ This test is not yet approved or cleared by the [place] FDA and  has been authorized for detection and/or diagnosis of SARS-CoV-2 by  FDA under an Emergency Use Authorization (EUA). This EUA will remain  in effect (meaning this test can be used) for the duration of the  Covid-19 declaration under Section 564(b)(1) of the Act, 21  U.S.C. section 360bbb-3(b)(1), unless the authorization is  terminated or revoked. Performed at [facility], [address], [place], [place] [address]   Urine culture     Status: None   Collection Time: 02/21/20  3:36 PM   Specimen: Urine, Random  Result Value Ref Range Status   Specimen Description   Final    URINE, RANDOM Performed at [facility], [address], [place], [place] [address]    Special Requests   Final    NONE Performed at [facility], [address] [place], [place] [address]    Culture   Final    NO GROWTH Performed at [facility], [address] [place], [place] [address]    Report Status 02/22/2020 FINAL  Final         Radiology Studies: CT Angio Chest PE W and/or Wo Contrast  Result Date: 02/21/2020 CLINICAL DATA:  Shortness of breath with hypoxia. Left lower  extremity swelling. Symptoms 2-3 weeks. EXAM: CT ANGIOGRAPHY CHEST WITH CONTRAST TECHNIQUE: Multidetector CT imaging of the chest was performed using the standard protocol during bolus administration of intravenous contrast. Multiplanar CT image reconstructions and MIPs were obtained to evaluate the vascular anatomy. CONTRAST:  40mL OMNIPAQUE IOHEXOL 350 MG/ML SOLN COMPARISON:  09/11/2010 FINDINGS: Cardiovascular: There is mild to moderate cardiomegaly. Small amount of pericardial fluid is present. Mild prominence of the main pulmonary artery segment. No evidence of pulmonary emboli. Thoracic aorta is normal in caliber. Mediastinum/Nodes: Possible 1.4 cm AP window lymph node and 1.1 cm precarinal lymph node. No significant hilar adenopathy. Remaining mediastinal structures are unremarkable. Lungs/Pleura: Examination demonstrates centrilobular emphysematous disease. Linear density over the right base likely atelectasis. Small right pleural effusion is present. Mild linear atelectasis/scarring over the right middle lobe. There are several bilateral subcentimeter pulmonary nodules most subpleural in location with the largest over the lateral right lower lobe measuring 8 mm in greatest diameter (image 101 series 7). Airways are normal. Upper Abdomen: No acute findings. Musculoskeletal: Diffuse subcutaneous edema over the chest. Suggestion of mild bilateral symmetric axillary adenopathy. No focal bony abnormality. Bone island over the lower thoracic spine unchanged. Review of the MIP images confirms the above findings. IMPRESSION: 1.  No evidence of pulmonary embolism. 2.  Small right pleural effusion with bibasilar atelectasis. 3. Multiple small bilateral subcentimeter pulmonary nodules mostly in subpleural location with the largest measuring 8 mm in greatest dimension over the lateral right lower lobe. Couple possible low-density slightly enlarged mediastinal lymph nodes as well as mild symmetric bilateral axillary  adenopathy. Findings may be seen with metastatic disease as recommend clinical correlation and at least follow-up chest CT 3 months. 4. Mild-to-moderate cardiomegaly with small amount of pericardial fluid. Diffuse subcutaneous edema. Electronically Signed   By: Elberta Fortis M.D.   On: 02/21/2020 10:54   DG Chest  Portable 1 View  Result Date: 02/21/2020 CLINICAL DATA:  Shortness of breath. EXAM: PORTABLE CHEST 1 VIEW COMPARISON:  November 06, 2010. FINDINGS: Stable cardiomegaly. No pneumothorax or pleural effusion is noted. Both lungs are clear. The visualized skeletal structures are unremarkable. IMPRESSION: No active disease. Electronically Signed   By: Lupita Raider M.D.   On: 02/21/2020 08:45   ECHOCARDIOGRAM COMPLETE  Result Date: 02/21/2020    ECHOCARDIOGRAM REPORT   Patient Name:   DORTHA NEIGHBORS Date of Exam: 02/21/2020 Medical Rec #:  [id number]        Height:       63.0 in Accession #:    [id number]       Weight:       143.3 lb Date of Birth:  08-Feb-1966        BSA:          1.678 m Patient Age:    53 years         BP:           123/91 mmHg Patient Gender: F                HR:           97 bpm. Exam Location:  Inpatient Procedure: 2D Echo, Color Doppler and Cardiac Doppler Indications:    I50.31 Acute diastolic (congestive) heart failure  History:        Patient has no prior history of Echocardiogram examinations.                 COPD.  Sonographer:    Irving Burton Senior RDCS Referring Phys: [phone] [facility]OK[facility]REL IMPRESSIONS  1. Left ventricular ejection fraction, by estimation, is 60 to 65%. The left ventricle has normal function. The left ventricle has no regional wall motion abnormalities. Left ventricular diastolic parameters are consistent with Grade I diastolic dysfunction (impaired relaxation).  2. There appear to be significant trabeculations in the RV apex. Given RV dysfunction, recommend cardiac MRI to evaluate RV further. Right ventricular systolic function is moderately reduced. The right  ventricular size is mildly enlarged. There is severely elevated pulmonary artery systolic pressure. The estimated right ventricular systolic pressure is 64.0 mmHg.  3. Right atrial size was severely dilated.  4. The mitral valve is normal in structure. No evidence of mitral valve regurgitation. No evidence of mitral stenosis.  5. Tricuspid valve regurgitation is moderate.  6. The aortic valve is normal in structure. Aortic valve regurgitation is not visualized. No aortic stenosis is present.  7. The inferior vena cava is dilated in size with <50% respiratory variability, suggesting right atrial pressure of 15 mmHg. FINDINGS  Left Ventricle: Left ventricular ejection fraction, by estimation, is 60 to 65%. The left ventricle has normal function. The left ventricle has no regional wall motion abnormalities. The left ventricular internal cavity size was normal in size. There is  no left ventricular hypertrophy. Left ventricular diastolic parameters are consistent with Grade I diastolic dysfunction (impaired relaxation). Normal left ventricular filling pressure. Right Ventricle: There appear to be significant trabeculations in the RV apex. Given RV dysfunction, recommend cardiac MRI to evaluate RV further. The right ventricular size is mildly enlarged. No increase in right ventricular wall thickness. Right ventricular systolic function is moderately reduced. There is severely elevated pulmonary artery systolic pressure. The tricuspid regurgitant velocity is 3.50 m/s, and with an assumed right atrial pressure of 15 mmHg, the estimated right ventricular systolic pressure is 64.0 mmHg. Left Atrium: Left atrial size was  normal in size. Right Atrium: Right atrial size was severely dilated. Pericardium: A small pericardial effusion is present. The pericardial effusion is circumferential. Mitral Valve: The mitral valve is normal in structure. Normal mobility of the mitral valve leaflets. No evidence of mitral valve  regurgitation. No evidence of mitral valve stenosis. Tricuspid Valve: The tricuspid valve is normal in structure. Tricuspid valve regurgitation is moderate . No evidence of tricuspid stenosis. Aortic Valve: The aortic valve is normal in structure. Aortic valve regurgitation is not visualized. No aortic stenosis is present. Pulmonic Valve: The pulmonic valve was normal in structure. Pulmonic valve regurgitation is trivial. No evidence of pulmonic stenosis. Aorta: The aortic root is normal in size and structure. Venous: The inferior vena cava is dilated in size with less than 50% respiratory variability, suggesting right atrial pressure of 15 mmHg. IAS/Shunts: No atrial level shunt detected by color flow Doppler.  LEFT VENTRICLE PLAX 2D LVIDd:         3.40 cm  Diastology LVIDs:         2.20 cm  LV e' lateral:   9.57 cm/s LV PW:         0.80 cm  LV E/e' lateral: 6.2 LV IVS:        0.90 cm  LV e' medial:    8.05 cm/s LVOT diam:     1.80 cm  LV E/e' medial:  7.4 LV SV:         32 LV SV Index:   19 LVOT Area:     2.54 cm  RIGHT VENTRICLE RV S prime:     8.49 cm/s TAPSE (M-mode): 1.2 cm LEFT ATRIUM             Index       RIGHT ATRIUM           Index LA diam:        3.60 cm 2.15 cm/m  RA Area:     22.00 cm LA Vol (A2C):   47.3 ml 28.19 ml/m RA Volume:   65.30 ml  38.91 ml/m LA Vol (A4C):   28.8 ml 17.16 ml/m LA Biplane Vol: 38.4 ml 22.88 ml/m  AORTIC VALVE LVOT Vmax:   76.10 cm/s LVOT Vmean:  52.300 cm/s LVOT VTI:    0.126 m  AORTA Ao Root diam: 2.60 cm Ao Asc diam:  2.80 cm MITRAL VALVE               TRICUSPID VALVE MV Area (PHT): 2.96 cm    TR Peak grad:   49.0 mmHg MV Decel Time: 256 msec    TR Vmax:        350.00 cm/s MV E velocity: 59.70 cm/s MV A velocity: 78.60 cm/s  SHUNTS MV E/A ratio:  0.76        Systemic VTI:  0.13 m                            Systemic Diam: 1.80 cm Armanda Magic MD Electronically signed by Armanda Magic MD Signature Date/Time: 02/21/2020/3:33:22 PM    Final         Scheduled  Meds: . Chlorhexidine Gluconate Cloth  6 each Topical Daily  . docusate sodium  100 mg Oral BID  . enoxaparin (LOVENOX) injection  40 mg Subcutaneous Q24H  . fluticasone furoate-vilanterol  1 puff Inhalation Daily  . folic acid  1 mg Oral Daily  . furosemide  80 mg Intravenous BID  .  guaiFENesin  600 mg Oral BID  . nicotine  14 mg Transdermal Daily  . thiamine  100 mg Oral Daily   Continuous Infusions:    LOS: 1 day    Time spent: 45 minutes    Ramiro Harvest, MD [facility]   To contact the attending provider between 7A-7P or the covering provider during after hours 7P-7A, please log into the web site [email or web address] and access using universal [place] password for that web site. If you do not have the password, please call the hospital operator.  02/22/2020, 5:00 PM

## 2020-02-22 NOTE — Consult Note (Addendum)
Cardiology Consultation:   Patient ID: Michele Parrish MRN: [id number]; DOB: 1966-10-06  Admit date: 02/21/2020 Date of Consult: 02/22/2020  Primary Care Provider: Clent Demark, PA-C Primary Cardiologist: Minus Breeding, MD  Primary Electrophysiologist:  None    Patient Profile:   Michele Parrish is a 54 y.o. female with a hx of chronic bronchitis not on inhalers and COPD who is being seen today for the evaluation of shortness of breath at the request of Dr. Grandville Silos.  History of Present Illness:   Michele Parrish with the above past medical history presented to [facility] with complaints of cough and shortness of breath x 3 weeks. Echocardiogram was ordered and revealed normal EF but mild DD.   BNP elevated 986 HS troponin x 2 negative CXR did not note pulmonary congestion CT chest negative for PE, small right pleural effusion, bibasilar atelectasis, multiple small bilateral nodules  On my interview, she reports onset of SOB, orthopnea, lower extremity swelling, abdominal fullness, and significant breast swelling about 3 weeks ago. It sounds like this was a sudden onset with upper body edema.  She tried to use her nebulizer, but the fluid persisted prompting ER evaluation. No history of breast caner in her family. No chest pain, palpitations, or syncope.   Past Medical History:  Diagnosis Date  . Bronchitis     Past Surgical History:  Procedure Laterality Date  . ABDOMINAL HYSTERECTOMY       Home Medications:  Prior to Admission medications   Medication Sig Start Date End Date Taking? Authorizing Provider  ipratropium-albuterol (DUONEB) 0.5-2.5 (3) MG/3ML SOLN Take 3 mLs by nebulization every 6 (six) hours as needed. 07/28/17  Yes Clent Demark, PA-C  albuterol (PROVENTIL HFA;VENTOLIN HFA) 108 (90 Base) MCG/ACT inhaler Inhale 2 puffs into the lungs every 4 (four) hours as needed for wheezing or shortness of breath. 07/28/17 08/27/17  Clent Demark, PA-C  fluticasone  furoate-vilanterol (BREO ELLIPTA) 200-25 MCG/INH AEPB Inhale 1 puff into the lungs daily. Patient not taking: Reported on 02/21/2020 07/28/17   Clent Demark, PA-C  ibuprofen (ADVIL,MOTRIN) 400 MG tablet Take 1 tablet (400 mg total) by mouth every 8 (eight) hours as needed for pain. Patient not taking: Reported on 07/22/2017 01/13/13   Rosana Hoes, MD    Inpatient Medications: Scheduled Meds: . Chlorhexidine Gluconate Cloth  6 each Topical Daily  . docusate sodium  100 mg Oral BID  . enoxaparin (LOVENOX) injection  40 mg Subcutaneous Q24H  . fluticasone furoate-vilanterol  1 puff Inhalation Daily  . folic acid  1 mg Oral Daily  . furosemide  40 mg Intravenous BID  . guaiFENesin  600 mg Oral BID  . nicotine  14 mg Transdermal Daily  . thiamine  100 mg Oral Daily   Continuous Infusions: . magnesium sulfate bolus IVPB 4 g (02/22/20 1112)   PRN Meds: acetaminophen **OR** acetaminophen, bisacodyl, ipratropium-albuterol, LORazepam, ondansetron **OR** ondansetron (ZOFRAN) IV, polyethylene glycol  Allergies:   No Known Allergies  Social History:   Social History   Socioeconomic History  . Marital status: Single    Spouse name: Not on file  . Number of children: Not on file  . Years of education: Not on file  . Highest education level: Not on file  Occupational History  . Not on file  Tobacco Use  . Smoking status: Current Every Day Smoker    Types: Cigarettes  . Smokeless tobacco: Never Used  . Tobacco comment: 1/2 ppd  Substance and Sexual Activity  .  Alcohol use: Yes    Comment: 2 beers per day  . Drug use: No  . Sexual activity: Not on file  Other Topics Concern  . Not on file  Social History Narrative  . Not on file   Social Determinants of Health   Financial Resource Strain:   . Difficulty of Paying Living Expenses:   Food Insecurity:   . Worried About Programme researcher, broadcasting/film/video in the Last Year:   . Barista in the Last Year:   Transportation Needs:   . Sales promotion account executive (Medical):   Marland Kitchen Lack of Transportation (Non-Medical):   Physical Activity:   . Days of Exercise per Week:   . Minutes of Exercise per Session:   Stress:   . Feeling of Stress :   Social Connections:   . Frequency of Communication with Friends and Family:   . Frequency of Social Gatherings with Friends and Family:   . Attends Religious Services:   . Active Member of Clubs or Organizations:   . Attends Banker Meetings:   Marland Kitchen Marital Status:   Intimate Partner Violence:   . Fear of Current or Ex-Partner:   . Emotionally Abused:   Marland Kitchen Physically Abused:   . Sexually Abused:     Family History:    Family History  Problem Relation Age of Onset  . Prostate cancer Father      ROS:  Please see the history of present illness.   All other ROS reviewed and negative.     Physical Exam/Data:   Vitals:   02/21/20 2056 02/22/20 0054 02/22/20 0636 02/22/20 0838  BP: (!) 124/94 102/70 110/76 131/84  Pulse: 91 92 88 90  Resp: Temp: 98.4 F (36.9 C) 98.1 F (36.7 C) 98 F (36.7 C) 98.2 F (36.8 C)  TempSrc: Oral Oral Oral Oral  SpO2: 92% 96% 99% 94%  Weight: 73.6 kg  73.4 kg   Height:        Intake/Output Summary (Last 24 hours) at 02/22/2020 1214 Last data filed at 02/22/2020 0917 Gross per 24 hour  Intake --  Output 3650 ml  Net -3650 ml   Last 3 Weights 02/22/2020 02/21/2020 02/21/2020  Weight (lbs) 161 lb 13.1 oz 162 lb 4.1 oz 143 lb 4.8 oz  Weight (kg) 73.4 kg 73.6 kg 65 kg     Body mass index is 28.66 kg/m.  General:  Obese female in mild respiratory distress HEENT: normal Neck: + JVD Vascular: No carotid bruits Cardiac:  normal S1, S2; RRR; no murmur  Lungs:  Respirations mildly labored, wheezing throughout Abd: soft, nontender, no hepatomegaly  Ext: 1+ B LE edema Musculoskeletal:  No deformities, BUE and BLE strength normal and equal Skin: warm and dry  Neuro:  CNs 2-12 intact, no focal abnormalities noted Psych:  Normal  affect  Breasts with significant swelling and pitting  EKG:  The EKG was personally reviewed and demonstrates:  Sinus rhythm HR 94 Telemetry:  Telemetry was personally reviewed and demonstrates:  Sinus rhythm HR in the 80-90s  Relevant CV Studies:  Echo 02/21/20: 1. Left ventricular ejection fraction, by estimation, is 60 to 65%. The  left ventricle has normal function. The left ventricle has no regional  wall motion abnormalities. Left ventricular diastolic parameters are  consistent with Grade I diastolic  dysfunction (impaired relaxation).  2. There appear to be significant trabeculations in the RV apex. Given RV  dysfunction, recommend cardiac MRI to  evaluate RV further. Right  ventricular systolic function is moderately reduced. The right ventricular  size is mildly enlarged. There is  severely elevated pulmonary artery systolic pressure. The estimated right  ventricular systolic pressure is 64.0 mmHg.  3. Right atrial size was severely dilated.  4. The mitral valve is normal in structure. No evidence of mitral valve  regurgitation. No evidence of mitral stenosis.  5. Tricuspid valve regurgitation is moderate.  6. The aortic valve is normal in structure. Aortic valve regurgitation is  not visualized. No aortic stenosis is present.  7. The inferior vena cava is dilated in size with <50% respiratory  variability, suggesting right atrial pressure of 15 mmHg.   Laboratory Data:  High Sensitivity Troponin:   Recent Labs  Lab 02/21/20 1316 02/21/20 1605  TROPONINIHS 16 15     Chemistry Recent Labs  Lab 02/21/20 0829 02/21/20 0829 02/21/20 0839 02/21/20 1316 02/22/20 0549  NA 142  --  142  --  140  K 4.3   < > 6.8* 3.8 4.0  CL 115*  --  110  --  110  CO2 22  --   --   --  25  GLUCOSE 78  --  72  --  92  BUN 19  --  27*  --  17  CREATININE 1.35*  --  1.30*  --  1.08*  CALCIUM 8.3*  --   --   --  8.0*  GFRNONAA 45*  --   --   --  59*  GFRAA 53*  --   --   --   >60  ANIONGAP 5  --   --   --  5   < > = values in this interval not displayed.    Recent Labs  Lab 02/21/20 0829 02/22/20 0549  PROT 7.3 6.7  ALBUMIN 2.9* 2.7*  AST 33 25  ALT 21 20  ALKPHOS 99 90  BILITOT 0.7 0.9   Hematology Recent Labs  Lab 02/21/20 0829 02/21/20 0839 02/22/20 0549  WBC 7.7  --  6.9  RBC 4.82  --  4.46  HGB 12.4 15.0 11.4*  HCT 41.7 44.0 38.4  MCV 86.5  --  86.1  MCH 25.7*  --  25.6*  MCHC 29.7*  --  29.7*  RDW 18.9*  --  18.3*  PLT 209  --  184   BNP Recent Labs  Lab 02/21/20 0829 02/22/20 0549  BNP 986.4* 846.6*    DDimer No results for input(s): DDIMER in the last 168 hours.   Radiology/Studies:  CT Angio Chest PE W and/or Wo Contrast  Result Date: 02/21/2020 CLINICAL DATA:  Shortness of breath with hypoxia. Left lower extremity swelling. Symptoms 2-3 weeks. EXAM: CT ANGIOGRAPHY CHEST WITH CONTRAST TECHNIQUE: Multidetector CT imaging of the chest was performed using the standard protocol during bolus administration of intravenous contrast. Multiplanar CT image reconstructions and MIPs were obtained to evaluate the vascular anatomy. CONTRAST:  80mL OMNIPAQUE IOHEXOL 350 MG/ML SOLN COMPARISON:  09/11/2010 FINDINGS: Cardiovascular: There is mild to moderate cardiomegaly. Small amount of pericardial fluid is present. Mild prominence of the main pulmonary artery segment. No evidence of pulmonary emboli. Thoracic aorta is normal in caliber. Mediastinum/Nodes: Possible 1.4 cm AP window lymph node and 1.1 cm precarinal lymph node. No significant hilar adenopathy. Remaining mediastinal structures are unremarkable. Lungs/Pleura: Examination demonstrates centrilobular emphysematous disease. Linear density over the right base likely atelectasis. Small right pleural effusion is present. Mild linear atelectasis/scarring over the right middle  lobe. There are several bilateral subcentimeter pulmonary nodules most subpleural in location with the largest over the  lateral right lower lobe measuring 8 mm in greatest diameter (image 101 series 7). Airways are normal. Upper Abdomen: No acute findings. Musculoskeletal: Diffuse subcutaneous edema over the chest. Suggestion of mild bilateral symmetric axillary adenopathy. No focal bony abnormality. Bone island over the lower thoracic spine unchanged. Review of the MIP images confirms the above findings. IMPRESSION: 1.  No evidence of pulmonary embolism. 2.  Small right pleural effusion with bibasilar atelectasis. 3. Multiple small bilateral subcentimeter pulmonary nodules mostly in subpleural location with the largest measuring 8 mm in greatest dimension over the lateral right lower lobe. Couple possible low-density slightly enlarged mediastinal lymph nodes as well as mild symmetric bilateral axillary adenopathy. Findings may be seen with metastatic disease as recommend clinical correlation and at least follow-up chest CT 3 months. 4. Mild-to-moderate cardiomegaly with small amount of pericardial fluid. Diffuse subcutaneous edema. Electronically Signed   By: Elberta Fortis M.D.   On: 02/21/2020 10:54   DG Chest Portable 1 View  Result Date: 02/21/2020 CLINICAL DATA:  Shortness of breath. EXAM: PORTABLE CHEST 1 VIEW COMPARISON:  November 06, 2010. FINDINGS: Stable cardiomegaly. No pneumothorax or pleural effusion is noted. Both lungs are clear. The visualized skeletal structures are unremarkable. IMPRESSION: No active disease. Electronically Signed   By: Lupita Raider M.D.   On: 02/21/2020 08:45   ECHOCARDIOGRAM COMPLETE  Result Date: 02/21/2020    ECHOCARDIOGRAM REPORT   Patient Name:   Michele Parrish Date of Exam: 02/21/2020 Medical Rec #:  [id number]        Height:       63.0 in Accession #:    [id number]       Weight:       143.3 lb Date of Birth:  01-26-1966        BSA:          1.678 m Patient Age:    53 years         BP:           123/91 mmHg Patient Gender: F                HR:           97 bpm. Exam Location:   Inpatient Procedure: 2D Echo, Color Doppler and Cardiac Doppler Indications:    I50.31 Acute diastolic (congestive) heart failure  History:        Patient has no prior history of Echocardiogram examinations.                 COPD.  Sonographer:    Irving Burton Senior RDCS Referring Phys: [phone] [facility] [place] IMPRESSIONS  1. Left ventricular ejection fraction, by estimation, is 60 to 65%. The left ventricle has normal function. The left ventricle has no regional wall motion abnormalities. Left ventricular diastolic parameters are consistent with Grade I diastolic dysfunction (impaired relaxation).  2. There appear to be significant trabeculations in the RV apex. Given RV dysfunction, recommend cardiac MRI to evaluate RV further. Right ventricular systolic function is moderately reduced. The right ventricular size is mildly enlarged. There is severely elevated pulmonary artery systolic pressure. The estimated right ventricular systolic pressure is 64.0 mmHg.  3. Right atrial size was severely dilated.  4. The mitral valve is normal in structure. No evidence of mitral valve regurgitation. No evidence of mitral stenosis.  5. Tricuspid valve regurgitation is moderate.  6. The aortic valve  is normal in structure. Aortic valve regurgitation is not visualized. No aortic stenosis is present.  7. The inferior vena cava is dilated in size with <50% respiratory variability, suggesting right atrial pressure of 15 mmHg. FINDINGS  Left Ventricle: Left ventricular ejection fraction, by estimation, is 60 to 65%. The left ventricle has normal function. The left ventricle has no regional wall motion abnormalities. The left ventricular internal cavity size was normal in size. There is  no left ventricular hypertrophy. Left ventricular diastolic parameters are consistent with Grade I diastolic dysfunction (impaired relaxation). Normal left ventricular filling pressure. Right Ventricle: There appear to be significant trabeculations in the RV  apex. Given RV dysfunction, recommend cardiac MRI to evaluate RV further. The right ventricular size is mildly enlarged. No increase in right ventricular wall thickness. Right ventricular systolic function is moderately reduced. There is severely elevated pulmonary artery systolic pressure. The tricuspid regurgitant velocity is 3.50 m/s, and with an assumed right atrial pressure of 15 mmHg, the estimated right ventricular systolic pressure is 64.0 mmHg. Left Atrium: Left atrial size was normal in size. Right Atrium: Right atrial size was severely dilated. Pericardium: A small pericardial effusion is present. The pericardial effusion is circumferential. Mitral Valve: The mitral valve is normal in structure. Normal mobility of the mitral valve leaflets. No evidence of mitral valve regurgitation. No evidence of mitral valve stenosis. Tricuspid Valve: The tricuspid valve is normal in structure. Tricuspid valve regurgitation is moderate . No evidence of tricuspid stenosis. Aortic Valve: The aortic valve is normal in structure. Aortic valve regurgitation is not visualized. No aortic stenosis is present. Pulmonic Valve: The pulmonic valve was normal in structure. Pulmonic valve regurgitation is trivial. No evidence of pulmonic stenosis. Aorta: The aortic root is normal in size and structure. Venous: The inferior vena cava is dilated in size with less than 50% respiratory variability, suggesting right atrial pressure of 15 mmHg. IAS/Shunts: No atrial level shunt detected by color flow Doppler.  LEFT VENTRICLE PLAX 2D LVIDd:         3.40 cm  Diastology LVIDs:         2.20 cm  LV e' lateral:   9.57 cm/s LV PW:         0.80 cm  LV E/e' lateral: 6.2 LV IVS:        0.90 cm  LV e' medial:    8.05 cm/s LVOT diam:     1.80 cm  LV E/e' medial:  7.4 LV SV:         32 LV SV Index:   19 LVOT Area:     2.54 cm  RIGHT VENTRICLE RV S prime:     8.49 cm/s TAPSE (M-mode): 1.2 cm LEFT ATRIUM             Index       RIGHT ATRIUM            Index LA diam:        3.60 cm 2.15 cm/m  RA Area:     22.00 cm LA Vol (A2C):   47.3 ml 28.19 ml/m RA Volume:   65.30 ml  38.91 ml/m LA Vol (A4C):   28.8 ml 17.16 ml/m LA Biplane Vol: 38.4 ml 22.88 ml/m  AORTIC VALVE LVOT Vmax:   76.10 cm/s LVOT Vmean:  52.300 cm/s LVOT VTI:    0.126 m  AORTA Ao Root diam: 2.60 cm Ao Asc diam:  2.80 cm MITRAL VALVE  TRICUSPID VALVE MV Area (PHT): 2.96 cm    TR Peak grad:   49.0 mmHg MV Decel Time: 256 msec    TR Vmax:        350.00 cm/s MV E velocity: 59.70 cm/s MV A velocity: 78.60 cm/s  SHUNTS MV E/A ratio:  0.76        Systemic VTI:  0.13 m                            Systemic Diam: 1.80 cm Armanda Magic MD Electronically signed by Armanda Magic MD Signature Date/Time: 02/21/2020/3:33:22 PM    Final    {  Assessment and Plan:   Acute on chronic diastolic heart failure Moderate right ventricular heart dysfunction - echo yesterday with grade I DD, preserved EF of 60-65%, but moderate RV dysfunction and trabeculations in the RV apex --> recommend cardiac MRI - BNP on arrival was 986 - albumin 2.9 - lower extremity swelling, anasarca, and swelling in breasts - sounds to be sudden onset - I have asked nursing to place a breast binder/abdominal binder for comfort - diuresing on 40 mg IV lasix - 3.4 L urine output yesterday - weight is 161 lbs from 162 lbs - remains on 4-5L O2 - she appears in mild distress - I will increase lasix to 80 mg BID - daily BMP - strict I&Os - will transfer to [facility] for right heart cath tomorrow and cardiac MRI   Dyslipidemia 02/21/2020: Cholesterol 119; HDL 37; LDL Cholesterol 65; Triglycerides 85; VLDL 17   AKI, resovling - sCr 1.35 --> now 1.08 - K 4.0 - daily BMP     Acute Respiratory failure, now on room air Chronic bronchitis, COPD - per primary - continues on 4-5 L O2    For questions or updates, please contact [facility] Please consult [email or web address] for contact info under      Signed, Marcelino Duster, PA  02/22/2020 12:14 PM   History and all data above reviewed.  Patient examined.  I agree with the findings as above.  The patient presented about 2 weeks of increased shortness of breath.  She has some bronchitis which she says this is usually quite severe.  She has had increasing symptoms and actually has had to call EMS about 3 times recently.  Each time her oxygen saturations at rest are okay but she says when she would get up and move around after EMS she will be quite hypoxemic.  Has not been having any chest pressure, neck or arm discomfort.  Otherwise chronic issues has been severe breasts.  Is also had lower extremity swelling.  She is not describing PND or orthopnea.  Not describing cough productive of sputum although she does have some mild cough.  Echo demonstrates heavy trabeculations in the RV, RV dilatation and elevated pulmonary pressures with moderate TR..  The patient exam reveals COR:RRR  ,  Lungs: Clear  ,  Abd: Positive bowel sounds, no rebound no guarding, Ext mild edema,  Neck:  JVD to the jaw,  Chest:  Severe breast swelling.  Neck.  All available labs, radiology testing, previous records reviewed. Agree with documented assessment and plan.   Edema: This is a concerning scenario.  Her exam is unusual.  I did review her CT.  Vena cava syndrome but I do not see any evidence of this on the CT to rule out coronary embolism.  She does have some abnormalities.  Lymphadenopathy  and small nodules.  I spoke with Dr. Janee Morn.  She is going to be sent to [facility] and we will start with a right heart cath.  She will also likely need MRI.    I will defer further imaging of her lungs with abnormal nodules and adenopathy although the adenopathy could be edema.    She will also likely need an MRI of her.  Fayrene Fearing Keyarra Rendall  4:22 PM  02/22/2020

## 2020-02-22 NOTE — H&P (View-Only) (Signed)
Cardiology Consultation:   Patient ID: Michele Parrish MRN: [id number]; DOB: 10/29/1965  Admit date: 02/21/2020 Date of Consult: 02/22/2020  Primary Care Provider: Gomez, Roger David, PA-C Primary Cardiologist: Gibson Lad, MD  Primary Electrophysiologist:  None    Patient Profile:   Michele Parrish is a 54 y.o. female with a hx of chronic bronchitis not on inhalers and COPD who is being seen today for the evaluation of shortness of breath at the request of Dr. Thompson.  History of Present Illness:   Michele Parrish with the above past medical history presented to [facility] with complaints of cough and shortness of breath x 3 weeks. Echocardiogram was ordered and revealed normal EF but mild DD.   BNP elevated 986 HS troponin x 2 negative CXR did not note pulmonary congestion CT chest negative for PE, small right pleural effusion, bibasilar atelectasis, multiple small bilateral nodules  On my interview, she reports onset of SOB, orthopnea, lower extremity swelling, abdominal fullness, and significant breast swelling about 3 weeks ago. It sounds like this was a sudden onset with upper body edema.  She tried to use her nebulizer, but the fluid persisted prompting ER evaluation. No history of breast caner in her family. No chest pain, palpitations, or syncope.   Past Medical History:  Diagnosis Date  . Bronchitis     Past Surgical History:  Procedure Laterality Date  . ABDOMINAL HYSTERECTOMY       Home Medications:  Prior to Admission medications   Medication Sig Start Date End Date Taking? Authorizing Provider  ipratropium-albuterol (DUONEB) 0.5-2.5 (3) MG/3ML SOLN Take 3 mLs by nebulization every 6 (six) hours as needed. 07/28/17  Yes Gomez, Roger David, PA-C  albuterol (PROVENTIL HFA;VENTOLIN HFA) 108 (90 Base) MCG/ACT inhaler Inhale 2 puffs into the lungs every 4 (four) hours as needed for wheezing or shortness of breath. 07/28/17 08/27/17  Gomez, Roger David, PA-C  fluticasone  furoate-vilanterol (BREO ELLIPTA) 200-25 MCG/INH AEPB Inhale 1 puff into the lungs daily. Patient not taking: Reported on 02/21/2020 07/28/17   Gomez, Roger David, PA-C  ibuprofen (ADVIL,MOTRIN) 400 MG tablet Take 1 tablet (400 mg total) by mouth every 8 (eight) hours as needed for pain. Patient not taking: Reported on 07/22/2017 01/13/13   Coll, Paolo, MD    Inpatient Medications: Scheduled Meds: . Chlorhexidine Gluconate Cloth  6 each Topical Daily  . docusate sodium  100 mg Oral BID  . enoxaparin (LOVENOX) injection  40 mg Subcutaneous Q24H  . fluticasone furoate-vilanterol  1 puff Inhalation Daily  . folic acid  1 mg Oral Daily  . furosemide  40 mg Intravenous BID  . guaiFENesin  600 mg Oral BID  . nicotine  14 mg Transdermal Daily  . thiamine  100 mg Oral Daily   Continuous Infusions: . magnesium sulfate bolus IVPB 4 g (02/22/20 1112)   PRN Meds: acetaminophen **OR** acetaminophen, bisacodyl, ipratropium-albuterol, LORazepam, ondansetron **OR** ondansetron (ZOFRAN) IV, polyethylene glycol  Allergies:   No Known Allergies  Social History:   Social History   Socioeconomic History  . Marital status: Single    Spouse name: Not on file  . Number of children: Not on file  . Years of education: Not on file  . Highest education level: Not on file  Occupational History  . Not on file  Tobacco Use  . Smoking status: Current Every Day Smoker    Types: Cigarettes  . Smokeless tobacco: Never Used  . Tobacco comment: 1/2 ppd  Substance and Sexual Activity  .   Alcohol use: Yes    Comment: 2 beers per day  . Drug use: No  . Sexual activity: Not on file  Other Topics Concern  . Not on file  Social History Narrative  . Not on file   Social Determinants of Health   Financial Resource Strain:   . Difficulty of Paying Living Expenses:   Food Insecurity:   . Worried About Running Out of Food in the Last Year:   . Ran Out of Food in the Last Year:   Transportation Needs:   . Lack  of Transportation (Medical):   . Lack of Transportation (Non-Medical):   Physical Activity:   . Days of Exercise per Week:   . Minutes of Exercise per Session:   Stress:   . Feeling of Stress :   Social Connections:   . Frequency of Communication with Friends and Family:   . Frequency of Social Gatherings with Friends and Family:   . Attends Religious Services:   . Active Member of Clubs or Organizations:   . Attends Club or Organization Meetings:   . Marital Status:   Intimate Partner Violence:   . Fear of Current or Ex-Partner:   . Emotionally Abused:   . Physically Abused:   . Sexually Abused:     Family History:    Family History  Problem Relation Age of Onset  . Prostate cancer Father      ROS:  Please see the history of present illness.   All other ROS reviewed and negative.     Physical Exam/Data:   Vitals:   02/21/20 2056 02/22/20 0054 02/22/20 0636 02/22/20 0838  BP: (!) 124/94 102/70 110/76 131/84  Pulse: 91 92 88 90  Resp: 20 20 18 16  Temp: 98.4 F (36.9 C) 98.1 F (36.7 C) 98 F (36.7 C) 98.2 F (36.8 C)  TempSrc: Oral Oral Oral Oral  SpO2: 92% 96% 99% 94%  Weight: 73.6 kg  73.4 kg   Height:        Intake/Output Summary (Last 24 hours) at 02/22/2020 1214 Last data filed at 02/22/2020 0917 Gross per 24 hour  Intake --  Output 3650 ml  Net -3650 ml   Last 3 Weights 02/22/2020 02/21/2020 02/21/2020  Weight (lbs) 161 lb 13.1 oz 162 lb 4.1 oz 143 lb 4.8 oz  Weight (kg) 73.4 kg 73.6 kg 65 kg     Body mass index is 28.66 kg/m.  General:  Obese female in mild respiratory distress HEENT: normal Neck: + JVD Vascular: No carotid bruits Cardiac:  normal S1, S2; RRR; no murmur  Lungs:  Respirations mildly labored, wheezing throughout Abd: soft, nontender, no hepatomegaly  Ext: 1+ B LE edema Musculoskeletal:  No deformities, BUE and BLE strength normal and equal Skin: warm and dry  Neuro:  CNs 2-12 intact, no focal abnormalities noted Psych:  Normal  affect  Breasts with significant swelling and pitting  EKG:  The EKG was personally reviewed and demonstrates:  Sinus rhythm HR 94 Telemetry:  Telemetry was personally reviewed and demonstrates:  Sinus rhythm HR in the 80-90s  Relevant CV Studies:  Echo 02/21/20: 1. Left ventricular ejection fraction, by estimation, is 60 to 65%. The  left ventricle has normal function. The left ventricle has no regional  wall motion abnormalities. Left ventricular diastolic parameters are  consistent with Grade I diastolic  dysfunction (impaired relaxation).  2. There appear to be significant trabeculations in the RV apex. Given RV  dysfunction, recommend cardiac MRI to   evaluate RV further. Right  ventricular systolic function is moderately reduced. The right ventricular  size is mildly enlarged. There is  severely elevated pulmonary artery systolic pressure. The estimated right  ventricular systolic pressure is 64.0 mmHg.  3. Right atrial size was severely dilated.  4. The mitral valve is normal in structure. No evidence of mitral valve  regurgitation. No evidence of mitral stenosis.  5. Tricuspid valve regurgitation is moderate.  6. The aortic valve is normal in structure. Aortic valve regurgitation is  not visualized. No aortic stenosis is present.  7. The inferior vena cava is dilated in size with <50% respiratory  variability, suggesting right atrial pressure of 15 mmHg.   Laboratory Data:  High Sensitivity Troponin:   Recent Labs  Lab 02/21/20 1316 02/21/20 1605  TROPONINIHS 16 15     Chemistry Recent Labs  Lab 02/21/20 0829 02/21/20 0829 02/21/20 0839 02/21/20 1316 02/22/20 0549  NA 142  --  142  --  140  K 4.3   < > 6.8* 3.8 4.0  CL 115*  --  110  --  110  CO2 22  --   --   --  25  GLUCOSE 78  --  72  --  92  BUN 19  --  27*  --  17  CREATININE 1.35*  --  1.30*  --  1.08*  CALCIUM 8.3*  --   --   --  8.0*  GFRNONAA 45*  --   --   --  59*  GFRAA 53*  --   --   --   >60  ANIONGAP 5  --   --   --  5   < > = values in this interval not displayed.    Recent Labs  Lab 02/21/20 0829 02/22/20 0549  PROT 7.3 6.7  ALBUMIN 2.9* 2.7*  AST 33 25  ALT 21 20  ALKPHOS 99 90  BILITOT 0.7 0.9   Hematology Recent Labs  Lab 02/21/20 0829 02/21/20 0839 02/22/20 0549  WBC 7.7  --  6.9  RBC 4.82  --  4.46  HGB 12.4 15.0 11.4*  HCT 41.7 44.0 38.4  MCV 86.5  --  86.1  MCH 25.7*  --  25.6*  MCHC 29.7*  --  29.7*  RDW 18.9*  --  18.3*  PLT 209  --  184   BNP Recent Labs  Lab 02/21/20 0829 02/22/20 0549  BNP 986.4* 846.6*    DDimer No results for input(s): DDIMER in the last 168 hours.   Radiology/Studies:  CT Angio Chest PE W and/or Wo Contrast  Result Date: 02/21/2020 CLINICAL DATA:  Shortness of breath with hypoxia. Left lower extremity swelling. Symptoms 2-3 weeks. EXAM: CT ANGIOGRAPHY CHEST WITH CONTRAST TECHNIQUE: Multidetector CT imaging of the chest was performed using the standard protocol during bolus administration of intravenous contrast. Multiplanar CT image reconstructions and MIPs were obtained to evaluate the vascular anatomy. CONTRAST:  80mL OMNIPAQUE IOHEXOL 350 MG/ML SOLN COMPARISON:  09/11/2010 FINDINGS: Cardiovascular: There is mild to moderate cardiomegaly. Small amount of pericardial fluid is present. Mild prominence of the main pulmonary artery segment. No evidence of pulmonary emboli. Thoracic aorta is normal in caliber. Mediastinum/Nodes: Possible 1.4 cm AP window lymph node and 1.1 cm precarinal lymph node. No significant hilar adenopathy. Remaining mediastinal structures are unremarkable. Lungs/Pleura: Examination demonstrates centrilobular emphysematous disease. Linear density over the right base likely atelectasis. Small right pleural effusion is present. Mild linear atelectasis/scarring over the right middle   lobe. There are several bilateral subcentimeter pulmonary nodules most subpleural in location with the largest over the  lateral right lower lobe measuring 8 mm in greatest diameter (image 101 series 7). Airways are normal. Upper Abdomen: No acute findings. Musculoskeletal: Diffuse subcutaneous edema over the chest. Suggestion of mild bilateral symmetric axillary adenopathy. No focal bony abnormality. Bone island over the lower thoracic spine unchanged. Review of the MIP images confirms the above findings. IMPRESSION: 1.  No evidence of pulmonary embolism. 2.  Small right pleural effusion with bibasilar atelectasis. 3. Multiple small bilateral subcentimeter pulmonary nodules mostly in subpleural location with the largest measuring 8 mm in greatest dimension over the lateral right lower lobe. Couple possible low-density slightly enlarged mediastinal lymph nodes as well as mild symmetric bilateral axillary adenopathy. Findings may be seen with metastatic disease as recommend clinical correlation and at least follow-up chest CT 3 months. 4. Mild-to-moderate cardiomegaly with small amount of pericardial fluid. Diffuse subcutaneous edema. Electronically Signed   By: Daniel  Boyle M.D.   On: 02/21/2020 10:54   DG Chest Portable 1 View  Result Date: 02/21/2020 CLINICAL DATA:  Shortness of breath. EXAM: PORTABLE CHEST 1 VIEW COMPARISON:  November 06, 2010. FINDINGS: Stable cardiomegaly. No pneumothorax or pleural effusion is noted. Both lungs are clear. The visualized skeletal structures are unremarkable. IMPRESSION: No active disease. Electronically Signed   By: Aalani Aikens  Green Jr M.D.   On: 02/21/2020 08:45   ECHOCARDIOGRAM COMPLETE  Result Date: 02/21/2020    ECHOCARDIOGRAM REPORT   Patient Name:   Karrina E Gora Date of Exam: 02/21/2020 Medical Rec #:  [id number]        Height:       63.0 in Accession #:    [id number]       Weight:       143.3 lb Date of Birth:  01/16/1966        BSA:          1.678 m Patient Age:    53 years         BP:           123/91 mmHg Patient Gender: F                HR:           97 bpm. Exam Location:   Inpatient Procedure: 2D Echo, Color Doppler and Cardiac Doppler Indications:    I50.31 Acute diastolic (congestive) heart failure  History:        Patient has no prior history of Echocardiogram examinations.                 COPD.  Sonographer:    Emily Senior RDCS Referring Phys: [id number] LAXMAN POKHREL IMPRESSIONS  1. Left ventricular ejection fraction, by estimation, is 60 to 65%. The left ventricle has normal function. The left ventricle has no regional wall motion abnormalities. Left ventricular diastolic parameters are consistent with Grade I diastolic dysfunction (impaired relaxation).  2. There appear to be significant trabeculations in the RV apex. Given RV dysfunction, recommend cardiac MRI to evaluate RV further. Right ventricular systolic function is moderately reduced. The right ventricular size is mildly enlarged. There is severely elevated pulmonary artery systolic pressure. The estimated right ventricular systolic pressure is 64.0 mmHg.  3. Right atrial size was severely dilated.  4. The mitral valve is normal in structure. No evidence of mitral valve regurgitation. No evidence of mitral stenosis.  5. Tricuspid valve regurgitation is moderate.  6. The aortic valve   is normal in structure. Aortic valve regurgitation is not visualized. No aortic stenosis is present.  7. The inferior vena cava is dilated in size with <50% respiratory variability, suggesting right atrial pressure of 15 mmHg. FINDINGS  Left Ventricle: Left ventricular ejection fraction, by estimation, is 60 to 65%. The left ventricle has normal function. The left ventricle has no regional wall motion abnormalities. The left ventricular internal cavity size was normal in size. There is  no left ventricular hypertrophy. Left ventricular diastolic parameters are consistent with Grade I diastolic dysfunction (impaired relaxation). Normal left ventricular filling pressure. Right Ventricle: There appear to be significant trabeculations in the RV  apex. Given RV dysfunction, recommend cardiac MRI to evaluate RV further. The right ventricular size is mildly enlarged. No increase in right ventricular wall thickness. Right ventricular systolic function is moderately reduced. There is severely elevated pulmonary artery systolic pressure. The tricuspid regurgitant velocity is 3.50 m/s, and with an assumed right atrial pressure of 15 mmHg, the estimated right ventricular systolic pressure is 64.0 mmHg. Left Atrium: Left atrial size was normal in size. Right Atrium: Right atrial size was severely dilated. Pericardium: A small pericardial effusion is present. The pericardial effusion is circumferential. Mitral Valve: The mitral valve is normal in structure. Normal mobility of the mitral valve leaflets. No evidence of mitral valve regurgitation. No evidence of mitral valve stenosis. Tricuspid Valve: The tricuspid valve is normal in structure. Tricuspid valve regurgitation is moderate . No evidence of tricuspid stenosis. Aortic Valve: The aortic valve is normal in structure. Aortic valve regurgitation is not visualized. No aortic stenosis is present. Pulmonic Valve: The pulmonic valve was normal in structure. Pulmonic valve regurgitation is trivial. No evidence of pulmonic stenosis. Aorta: The aortic root is normal in size and structure. Venous: The inferior vena cava is dilated in size with less than 50% respiratory variability, suggesting right atrial pressure of 15 mmHg. IAS/Shunts: No atrial level shunt detected by color flow Doppler.  LEFT VENTRICLE PLAX 2D LVIDd:         3.40 cm  Diastology LVIDs:         2.20 cm  LV e' lateral:   9.57 cm/s LV PW:         0.80 cm  LV E/e' lateral: 6.2 LV IVS:        0.90 cm  LV e' medial:    8.05 cm/s LVOT diam:     1.80 cm  LV E/e' medial:  7.4 LV SV:         32 LV SV Index:   19 LVOT Area:     2.54 cm  RIGHT VENTRICLE RV S prime:     8.49 cm/s TAPSE (M-mode): 1.2 cm LEFT ATRIUM             Index       RIGHT ATRIUM            Index LA diam:        3.60 cm 2.15 cm/m  RA Area:     22.00 cm LA Vol (A2C):   47.3 ml 28.19 ml/m RA Volume:   65.30 ml  38.91 ml/m LA Vol (A4C):   28.8 ml 17.16 ml/m LA Biplane Vol: 38.4 ml 22.88 ml/m  AORTIC VALVE LVOT Vmax:   76.10 cm/s LVOT Vmean:  52.300 cm/s LVOT VTI:    0.126 m  AORTA Ao Root diam: 2.60 cm Ao Asc diam:  2.80 cm MITRAL VALVE                 TRICUSPID VALVE MV Area (PHT): 2.96 cm    TR Peak grad:   49.0 mmHg MV Decel Time: 256 msec    TR Vmax:        350.00 cm/s MV E velocity: 59.70 cm/s MV A velocity: 78.60 cm/s  SHUNTS MV E/A ratio:  0.76        Systemic VTI:  0.13 m                            Systemic Diam: 1.80 cm Traci Turner MD Electronically signed by Traci Turner MD Signature Date/Time: 02/21/2020/3:33:22 PM    Final    {  Assessment and Plan:   Acute on chronic diastolic heart failure Moderate right ventricular heart dysfunction - echo yesterday with grade I DD, preserved EF of 60-65%, but moderate RV dysfunction and trabeculations in the RV apex --> recommend cardiac MRI - BNP on arrival was 986 - albumin 2.9 - lower extremity swelling, anasarca, and swelling in breasts - sounds to be sudden onset - I have asked nursing to place a breast binder/abdominal binder for comfort - diuresing on 40 mg IV lasix - 3.4 L urine output yesterday - weight is 161 lbs from 162 lbs - remains on 4-5L O2 - she appears in mild distress - I will increase lasix to 80 mg BID - daily BMP - strict I&Os - will transfer to [facility] for right heart cath tomorrow and cardiac MRI   Dyslipidemia 02/21/2020: Cholesterol 119; HDL 37; LDL Cholesterol 65; Triglycerides 85; VLDL 17   AKI, resovling - sCr 1.35 --> now 1.08 - K 4.0 - daily BMP     Acute Respiratory failure, now on room air Chronic bronchitis, COPD - per primary - continues on 4-5 L O2    For questions or updates, please contact [facility] Please consult [email or web address] for contact info under      Signed, Angela Nicole Duke, PA  02/22/2020 12:14 PM   History and all data above reviewed.  Patient examined.  I agree with the findings as above.  The patient presented about 2 weeks of increased shortness of breath.  She has some bronchitis which she says this is usually quite severe.  She has had increasing symptoms and actually has had to call EMS about 3 times recently.  Each time her oxygen saturations at rest are okay but she says when she would get up and move around after EMS she will be quite hypoxemic.  Has not been having any chest pressure, neck or arm discomfort.  Otherwise chronic issues has been severe breasts.  Is also had lower extremity swelling.  She is not describing PND or orthopnea.  Not describing cough productive of sputum although she does have some mild cough.  Echo demonstrates heavy trabeculations in the RV, RV dilatation and elevated pulmonary pressures with moderate TR..  The patient exam reveals COR:RRR  ,  Lungs: Clear  ,  Abd: Positive bowel sounds, no rebound no guarding, Ext mild edema,  Neck:  JVD to the jaw,  Chest:  Severe breast swelling.  Neck.  All available labs, radiology testing, previous records reviewed. Agree with documented assessment and plan.   Edema: This is a concerning scenario.  Her exam is unusual.  I did review her CT.  Vena cava syndrome but I do not see any evidence of this on the CT to rule out coronary embolism.  She does have some abnormalities.  Lymphadenopathy   and small nodules.  I spoke with Dr. Thompson.  She is going to be sent to [facility] and we will start with a right heart cath.  She will also likely need MRI.    I will defer further imaging of her lungs with abnormal nodules and adenopathy although the adenopathy could be edema.    She will also likely need an MRI of her.  Shalicia Craghead  4:22 PM  02/22/2020 

## 2020-02-22 NOTE — Progress Notes (Signed)
Initial Nutrition Assessment  DOCUMENTATION CODES:   Not applicable  INTERVENTION:  Ensure Enlive po BID, each supplement provides 350 kcal and 20 grams of protein  MVI with minerals daily   NUTRITION DIAGNOSIS:   Increased nutrient needs related to acute illness, chronic illness(acute hypoxemic respiratory failure, COPD) as evidenced by estimated needs.    GOAL:   Patient will meet greater than or equal to 90% of their needs    MONITOR:   Labs, I & O's, Supplement acceptance, PO intake, Weight trends  REASON FOR ASSESSMENT:   Consult Assessment of nutrition requirement/status  ASSESSMENT:  54 year old female with past medical history of COPD, chronic bronchitis noncompliant with nebulizers presented with complaints of worsening SOB for the last 3 weeks and reports swelling in bilateral breasts, mostly on the left, abdomen, as well as bilateral legs admitted for acute hypoxic respiratory failure.  Patient curled on her side sleeping soundly this afternoon and did awake to name call x2. She is on a HH diet, no documented meals at this time for review. Will provide Ensure supplement to aid with meeting needs.   Moderate pitting generalized and LLE edema per RN assessement Current wt 161.48 lbs No recent wt history for review, on 07/22/17 pt weighed 144.32 lbs  I/Os: -3400 ml since admit UOP: 3.4 L x 24 hrs  Per notes: -Echo revealed normal EF but mild DD -CT chest negative for PE, small R pleural effusion, bibasilar atelectasis, multiple small bilateral nodules -breast binder/abdominal binder for comfort -increase lasix to 80 mg BID -remains on 4-5 L O2  Medications reviewed and include: Colace, Folic acid, Lasix, Thiamine IVPB: Magnesium sulfate Labs: Mg 1.6 (L), BNP 846 (H) K/P - WNL  NUTRITION - FOCUSED PHYSICAL EXAM: Unable to complete at this time.   Diet Order:   Diet Order            Diet Heart Room service appropriate? Yes; Fluid consistency: Thin   Diet effective now              EDUCATION NEEDS:   Not appropriate for education at this time  Skin:  Skin Assessment: Reviewed RN Assessment  Last BM:  4/29 type 5  Height:   Ht Readings from Last 1 Encounters:  02/21/20 5\' 3"  (1.6 m)    Weight:   Wt Readings from Last 1 Encounters:  02/22/20 73.4 kg   BMI:  Body mass index is 28.66 kg/m.  Estimated Nutritional Needs:   Kcal:  1830-2100  Protein:  90-105  Fluid:  >/= 1.8 L.day   02/24/20, RD, LDN Clinical Nutrition After Hours/Weekend Pager # in Amion

## 2020-02-22 NOTE — Evaluation (Addendum)
Occupational Therapy Evaluation Patient Details Name: Michele Parrish MRN: [id number] DOB: 20-May-1966 Today's Date: 02/22/2020    History of Present Illness 54 y.o. female with past medical history of chronic bronchitis supposed to be on nebulizers, presented to hospital with complaints of worsening shortness of breath for ~ 3 weeks. Patient diagnosed with acute hypoxemic respiratory failure.    Clinical Impression   Patient with functional deficits listed below impacting safety/independence with self care. Patient primarily supervision level for safety due to decreased activity tolerance. Educate patient to utilize pursed lip breathing techniques as audible wheezing heard with ambulation. Patient would benefit from additional energy conservation education and endurance training to maximize independence with self care. Patient is not on home O2, currently on 5L maintaining above 90% with ambulation in room.    Follow Up Recommendations  No OT follow up    Equipment Recommendations  None recommended by OT;Other (comment)(has access to any DME needs)       Precautions / Restrictions Precautions Precautions: Fall Precaution Comments: monitor O2 Restrictions Weight Bearing Restrictions: No      Mobility Bed Mobility Overal bed mobility: Modified Independent                Transfers Overall transfer level: Needs assistance Equipment used: None Transfers: Sit to/from Stand Sit to Stand: Supervision         General transfer comment: S for safety'    Balance Overall balance assessment: Mild deficits observed, not formally tested                                         ADL either performed or assessed with clinical judgement   ADL Overall ADL's : Needs assistance/impaired     Grooming: Set up;Sitting   Upper Body Bathing: Set up;Sitting   Lower Body Bathing: Supervison/ safety;Sit to/from stand   Upper Body Dressing : Set up;Sitting   Lower  Body Dressing: Supervision/safety;Sit to/from stand;Sitting/lateral leans   Toilet Transfer: Supervision/safety;Ambulation Toilet Transfer Details (indicate cue type and reason): simulated with recliner transfer, supervision for safety due to decreased activity tolerance  Toileting- Clothing Manipulation and Hygiene: Supervision/safety;Sit to/from stand       Functional mobility during ADLs: Supervision/safety General ADL Comments: patient primarily supervision level with self care and transfer for safety due to decreased activity tolerance                   Pertinent Vitals/Pain Pain Assessment: Faces Faces Pain Scale: Hurts little more Pain Location: chest/breast Pain Descriptors / Indicators: Heaviness;Discomfort Pain Intervention(s): Monitored during session     Hand Dominance Right   Extremity/Trunk Assessment Upper Extremity Assessment Upper Extremity Assessment: Overall WFL for tasks assessed   Lower Extremity Assessment Lower Extremity Assessment: Defer to PT evaluation   Cervical / Trunk Assessment Cervical / Trunk Assessment: Normal   Communication Communication Communication: No difficulties   Cognition Arousal/Alertness: Awake/alert Behavior During Therapy: WFL for tasks assessed/performed Overall Cognitive Status: Within Functional Limits for tasks assessed                                     General Comments  patient O2 maintain above 90% on 5L with ambulation in room approx 32ft. cue patient to utilize pursed lip breathing techniques with ambulation  Home Living Family/patient expects to be discharged to:: Private residence Living Arrangements: Alone Available Help at Discharge: Friend(s) Type of Home: House Home Access: Stairs to enter [employer] of Steps: 4 Entrance Stairs-Rails: (has "at least one") Home Layout: One level     Bathroom Shower/Tub: Chief Strategy Officer: Standard      Home Equipment: Emergency planning/management officer - 2 wheels;Cane - single point;Wheelchair - manual;Bedside commode   Additional Comments: pt states all home equipment was her father's and can get it if needed      Prior Functioning/Environment Level of Independence: Independent                 OT Problem List: Decreased activity tolerance;Cardiopulmonary status limiting activity      OT Treatment/Interventions: Energy conservation;DME and/or AE instruction;Patient/family education;Balance training;Self-care/ADL training;Therapeutic exercise    OT Goals(Current goals can be found in the care plan section) Acute Rehab OT Goals Patient Stated Goal: breathe better OT Goal Formulation: With patient Time For Goal Achievement: 03/07/20 Potential to Achieve Goals: Good  OT Frequency: Min 2X/week    AM-PAC OT "6 Clicks" Daily Activity     Outcome Measure Help from another person eating meals?: None Help from another person taking care of personal grooming?: A Little Help from another person toileting, which includes using toliet, bedpan, or urinal?: A Little Help from another person bathing (including washing, rinsing, drying)?: A Little Help from another person to put on and taking off regular upper body clothing?: A Little Help from another person to put on and taking off regular lower body clothing?: A Little 6 Click Score: 19   End of Session Equipment Utilized During Treatment: Oxygen Nurse Communication: Mobility status  Activity Tolerance: Patient limited by fatigue Patient left: in chair;with call bell/phone within reach  OT Visit Diagnosis: Other abnormalities of gait and mobility (R26.89);Pain Pain - part of body: (chest)                Time: 5170-0174 OT Time Calculation (min): 23 min Charges:  OT General Charges $OT Visit: 1 Visit OT Evaluation $OT Eval Moderate Complexity: 1 Mod OT Treatments $Self Care/Home Management : 8-22 mins  Marlyce Huge OT Pager:  [phone]  Carmelia Roller 02/22/2020, 1:37 PM

## 2020-02-22 NOTE — Plan of Care (Signed)
  Problem: Skin Integrity: Goal: Risk for impaired skin integrity will decrease Outcome: Progressing   

## 2020-02-23 ENCOUNTER — Inpatient Hospital Stay (HOSPITAL_COMMUNITY): Payer: Medicaid Other

## 2020-02-23 ENCOUNTER — Encounter (HOSPITAL_COMMUNITY): Payer: Medicaid Other

## 2020-02-23 ENCOUNTER — Encounter (HOSPITAL_COMMUNITY)
Admission: EM | Disposition: A | Discharge status: Home / Self Care | Payer: Self-pay | Source: Home / Self Care | Attending: Internal Medicine

## 2020-02-23 DIAGNOSIS — I5033 Acute on chronic diastolic (congestive) heart failure: Secondary | ICD-10-CM

## 2020-02-23 DIAGNOSIS — I272 Pulmonary hypertension, unspecified: Secondary | ICD-10-CM

## 2020-02-23 DIAGNOSIS — N179 Acute kidney failure, unspecified: Secondary | ICD-10-CM

## 2020-02-23 DIAGNOSIS — I2609 Other pulmonary embolism with acute cor pulmonale: Secondary | ICD-10-CM

## 2020-02-23 DIAGNOSIS — J449 Chronic obstructive pulmonary disease, unspecified: Secondary | ICD-10-CM

## 2020-02-23 DIAGNOSIS — I5031 Acute diastolic (congestive) heart failure: Secondary | ICD-10-CM

## 2020-02-23 DIAGNOSIS — I270 Primary pulmonary hypertension: Secondary | ICD-10-CM

## 2020-02-23 DIAGNOSIS — R918 Other nonspecific abnormal finding of lung field: Secondary | ICD-10-CM

## 2020-02-23 HISTORY — PX: RIGHT HEART CATH: CATH118263

## 2020-02-23 LAB — POCT I-STAT EG7
Acid-Base Excess: 7 mmol/L — ABNORMAL HIGH (ref 0.0–2.0)
Acid-Base Excess: 7 mmol/L — ABNORMAL HIGH (ref 0.0–2.0)
Bicarbonate: 34.8 mmol/L — ABNORMAL HIGH (ref 20.0–28.0)
Bicarbonate: 35.3 mmol/L — ABNORMAL HIGH (ref 20.0–28.0)
Calcium, Ion: 1.17 mmol/L (ref 1.15–1.40)
Calcium, Ion: 1.17 mmol/L (ref 1.15–1.40)
HCT: 41 % (ref 36.0–46.0)
HCT: 41 % (ref 36.0–46.0)
Hemoglobin: 13.9 g/dL (ref 12.0–15.0)
Hemoglobin: 13.9 g/dL (ref 12.0–15.0)
O2 Saturation: 62 %
O2 Saturation: 63 %
Potassium: 3.8 mmol/L (ref 3.5–5.1)
Potassium: 3.9 mmol/L (ref 3.5–5.1)
Sodium: 142 mmol/L (ref 135–145)
Sodium: 142 mmol/L (ref 135–145)
TCO2: 37 mmol/L — ABNORMAL HIGH (ref 22–32)
TCO2: 37 mmol/L — ABNORMAL HIGH (ref 22–32)
pCO2, Ven: 64.3 mmHg — ABNORMAL HIGH (ref 44.0–60.0)
pCO2, Ven: 65.6 mmHg — ABNORMAL HIGH (ref 44.0–60.0)
pH, Ven: 7.339 (ref 7.250–7.430)
pH, Ven: 7.341 (ref 7.250–7.430)
pO2, Ven: 35 mmHg (ref 32.0–45.0)
pO2, Ven: 36 mmHg (ref 32.0–45.0)

## 2020-02-23 LAB — CBC WITH DIFFERENTIAL/PLATELET
Abs Immature Granulocytes: 0.02 10*3/uL (ref 0.00–0.07)
Basophils Absolute: 0 10*3/uL (ref 0.0–0.1)
Basophils Relative: 0 %
Eosinophils Absolute: 0.1 10*3/uL (ref 0.0–0.5)
Eosinophils Relative: 1 %
HCT: 39.2 % (ref 36.0–46.0)
Hemoglobin: 11.8 g/dL — ABNORMAL LOW (ref 12.0–15.0)
Immature Granulocytes: 0 %
Lymphocytes Relative: 47 %
Lymphs Abs: 3.5 10*3/uL (ref 0.7–4.0)
MCH: 25.9 pg — ABNORMAL LOW (ref 26.0–34.0)
MCHC: 30.1 g/dL (ref 30.0–36.0)
MCV: 86.2 fL (ref 80.0–100.0)
Monocytes Absolute: 0.5 10*3/uL (ref 0.1–1.0)
Monocytes Relative: 6 %
Neutro Abs: 3.5 10*3/uL (ref 1.7–7.7)
Neutrophils Relative %: 46 %
Platelets: 200 10*3/uL (ref 150–400)
RBC: 4.55 MIL/uL (ref 3.87–5.11)
RDW: 18.6 % — ABNORMAL HIGH (ref 11.5–15.5)
WBC: 7.5 10*3/uL (ref 4.0–10.5)
nRBC: 0 % (ref 0.0–0.2)

## 2020-02-23 LAB — MAGNESIUM: Magnesium: 1.9 mg/dL (ref 1.7–2.4)

## 2020-02-23 LAB — COMPREHENSIVE METABOLIC PANEL
ALT: 18 U/L (ref 0–44)
AST: 21 U/L (ref 15–41)
Albumin: 2.7 g/dL — ABNORMAL LOW (ref 3.5–5.0)
Alkaline Phosphatase: 85 U/L (ref 38–126)
Anion gap: 7 (ref 5–15)
BUN: 16 mg/dL (ref 6–20)
CO2: 32 mmol/L (ref 22–32)
Calcium: 8 mg/dL — ABNORMAL LOW (ref 8.9–10.3)
Chloride: 102 mmol/L (ref 98–111)
Creatinine, Ser: 1.3 mg/dL — ABNORMAL HIGH (ref 0.44–1.00)
GFR calc Af Amer: 55 mL/min — ABNORMAL LOW (ref >60)
GFR calc non Af Amer: 47 mL/min — ABNORMAL LOW (ref >60)
Glucose, Bld: 91 mg/dL (ref 70–99)
Potassium: 3.7 mmol/L (ref 3.5–5.1)
Sodium: 141 mmol/L (ref 135–145)
Total Bilirubin: 1.1 mg/dL (ref 0.3–1.2)
Total Protein: 6.7 g/dL (ref 6.5–8.1)

## 2020-02-23 LAB — BASIC METABOLIC PANEL
Anion gap: 7 (ref 5–15)
BUN: 16 mg/dL (ref 6–20)
CO2: 29 mmol/L (ref 22–32)
Calcium: 7.8 mg/dL — ABNORMAL LOW (ref 8.9–10.3)
Chloride: 102 mmol/L (ref 98–111)
Creatinine, Ser: 1.31 mg/dL — ABNORMAL HIGH (ref 0.44–1.00)
GFR calc Af Amer: 55 mL/min — ABNORMAL LOW (ref >60)
GFR calc non Af Amer: 47 mL/min — ABNORMAL LOW (ref >60)
Glucose, Bld: 87 mg/dL (ref 70–99)
Potassium: 3.7 mmol/L (ref 3.5–5.1)
Sodium: 138 mmol/L (ref 135–145)

## 2020-02-23 SURGERY — RIGHT HEART CATH

## 2020-02-23 MED ORDER — LIDOCAINE HCL (PF) 1 % IJ SOLN
INTRAMUSCULAR | Status: DC | PRN
  Administered 2020-02-23: 2 mL

## 2020-02-23 MED ORDER — SODIUM CHLORIDE 0.9% FLUSH: 3.0000 mL | Freq: Two times a day (BID) | INTRAVENOUS | Status: DC

## 2020-02-23 MED ORDER — BUDESONIDE 0.5 MG/2ML IN SUSP
0.5000 mg | Freq: Two times a day (BID) | RESPIRATORY_TRACT | Status: DC
  Administered 2020-02-23 – 2020-02-28 (×8): 0.5 mg via RESPIRATORY_TRACT
  Filled 2020-02-23 (×10): qty 2

## 2020-02-23 MED ORDER — HEPARIN (PORCINE) IN NACL 1000-0.9 UT/500ML-% IV SOLN
INTRAVENOUS | Status: DC | PRN
  Administered 2020-02-23: 500 mL

## 2020-02-23 MED ORDER — SODIUM CHLORIDE 0.9 % IV SOLN: INTRAVENOUS | Status: DC

## 2020-02-23 MED ORDER — SODIUM CHLORIDE 0.9 % IV SOLN: 250.0000 mL | INTRAVENOUS | Status: DC | PRN

## 2020-02-23 MED ORDER — SODIUM CHLORIDE 0.9% FLUSH
3.0000 mL | Freq: Two times a day (BID) | INTRAVENOUS | Status: DC
  Administered 2020-02-24 – 2020-02-28 (×5): 3 mL via INTRAVENOUS

## 2020-02-23 MED ORDER — SODIUM CHLORIDE 0.9% FLUSH: 3.0000 mL | INTRAVENOUS | Status: DC | PRN

## 2020-02-23 MED ORDER — FENTANYL CITRATE (PF) 100 MCG/2ML IJ SOLN
INTRAMUSCULAR | Status: DC | PRN
  Administered 2020-02-23: 25 ug via INTRAVENOUS

## 2020-02-23 MED ORDER — METHYLPREDNISOLONE SODIUM SUCC 125 MG IJ SOLR
60.0000 mg | Freq: Once | INTRAMUSCULAR | Status: AC
  Administered 2020-02-23: 60 mg via INTRAVENOUS
  Filled 2020-02-23: qty 2

## 2020-02-23 MED ORDER — ASPIRIN 81 MG PO CHEW
81.0000 mg | CHEWABLE_TABLET | ORAL | Status: DC
Start: 2020-02-24 — End: 2020-02-23

## 2020-02-23 MED ORDER — MIDAZOLAM HCL 2 MG/2ML IJ SOLN
INTRAMUSCULAR | Status: AC
  Filled 2020-02-23: qty 2

## 2020-02-23 MED ORDER — GADOBUTROL 1 MMOL/ML IV SOLN
8.0000 mL | Freq: Once | INTRAVENOUS | Status: AC | PRN
  Administered 2020-02-23: 14:00:00 8 mL via INTRAVENOUS

## 2020-02-23 MED ORDER — FUROSEMIDE 40 MG PO TABS
40.0000 mg | ORAL_TABLET | Freq: Two times a day (BID) | ORAL | Status: DC
  Administered 2020-02-23 – 2020-02-28 (×11): 40 mg via ORAL
  Filled 2020-02-23 (×10): qty 1

## 2020-02-23 MED ORDER — HEPARIN (PORCINE) IN NACL 1000-0.9 UT/500ML-% IV SOLN
INTRAVENOUS | Status: AC
  Filled 2020-02-23: qty 500

## 2020-02-23 MED ORDER — MIDAZOLAM HCL 2 MG/2ML IJ SOLN
INTRAMUSCULAR | Status: DC | PRN
  Administered 2020-02-23: 1 mg via INTRAVENOUS

## 2020-02-23 MED ORDER — ASPIRIN 81 MG PO CHEW
81.0000 mg | CHEWABLE_TABLET | ORAL | Status: AC
  Administered 2020-02-23: 07:00:00 81 mg via ORAL
  Filled 2020-02-23: qty 1

## 2020-02-23 MED ORDER — FUROSEMIDE 10 MG/ML IJ SOLN
40.0000 mg | Freq: Two times a day (BID) | INTRAMUSCULAR | Status: DC
  Administered 2020-02-23: 08:00:00 40 mg via INTRAVENOUS
  Filled 2020-02-23: qty 4

## 2020-02-23 MED ORDER — ALBUMIN HUMAN 25 % IV SOLN
25.0000 g | Freq: Four times a day (QID) | INTRAVENOUS | Status: AC
  Administered 2020-02-23 – 2020-02-24 (×4): 25 g via INTRAVENOUS
  Filled 2020-02-23 (×5): qty 100

## 2020-02-23 MED ORDER — LIDOCAINE HCL (PF) 1 % IJ SOLN
INTRAMUSCULAR | Status: AC
  Filled 2020-02-23: qty 30

## 2020-02-23 MED ORDER — FENTANYL CITRATE (PF) 100 MCG/2ML IJ SOLN
INTRAMUSCULAR | Status: AC
  Filled 2020-02-23: qty 2

## 2020-02-23 MED ORDER — IPRATROPIUM-ALBUTEROL 0.5-2.5 (3) MG/3ML IN SOLN
3.0000 mL | Freq: Three times a day (TID) | RESPIRATORY_TRACT | Status: DC
  Administered 2020-02-23 – 2020-02-24 (×3): 3 mL via RESPIRATORY_TRACT
  Filled 2020-02-23 (×3): qty 3

## 2020-02-23 SURGICAL SUPPLY — 5 items
CATH BALLN WEDGE 5F 110CM (CATHETERS) ×1 IMPLANT
SHEATH GLIDE SLENDER 4/5FR (SHEATH) ×1 IMPLANT
TRANSDUCER W/STOPCOCK (MISCELLANEOUS) ×1 IMPLANT
TRAY CARDIAC CATHETERIZATION (CUSTOM PROCEDURE TRAY) ×1 IMPLANT
WIRE EMERALD 3MM-J .025X260CM (WIRE) ×1 IMPLANT

## 2020-02-23 NOTE — Consult Note (Signed)
NAME:  Michele Parrish, MRN:  [id number], DOB:  April 24, 1966, LOS: 2 ADMISSION DATE:  02/21/2020, CONSULTATION DATE:  02/23/20 REFERRING MD:  Antoine Poche - Cardiology, CHIEF COMPLAINT/Reason for consult:  CC:SOB, RFC: Pulmonary Hypertension  Brief History   54 yo F with chronic bronchitis, emphysematous lung changes admitted for SOB and hypoxia. Underwent RHC 4/30. PCCM consulted for pulmonary hypertension.  History of present illness   54 yo F PMH COPD with chronic bronchitis- non-compliant with OP meds, emphysematous pulmonary changes, who presented to ED with SOB x 3 weeks. SOB is progressive in quality, and has associated orthopnea as well as DOE. Patient also endorses associated edema of breasts, abdominal distention, and BLE. No URI sx or sick contacts. In ED, SpO2 80% on RA, SpO2 improved to 97% on 5LNC.   No previous history of diet pill use. Has done cocaine in the past- most recently about 6 months ago, but no amphetamines, adderall, ritalin, or other illicits. No previous history of malignancy or heart disease. No family history of heart disease, PH, or lung disease, including sarcoidosis. No history of VTE. Occasional snoring, but no witnessed apneas or known OSA. No history of sickle cell anemia. HIV negative in the past. Quit drinking 6 months ago. Quit smoking 1 week ago after 42 years x 1-2ppd.  BNP 986. ECHO with LVEF 60-65%, grade 1 DD. Severely elevated pulmonary artery systolic pressure and estimated RVSP .  Patient evaluated by cardiology and underwent RHC 4/30. PCCM Consulted 4/20 for PH.   Past Medical History  Chronic bronchitis - hasn't used nebulizer in 2 years prior to 3 weeks ago  Significant Hospital Events   4/28 admitted 4/30 RHC  Consults:  Cards PCCM  Procedures:  4/30 RHC   Significant Diagnostic Tests:  4/28  CTA chest> no PE. Small R pleural effusion, bibasilar atelectasis. Multiple small bilateral pulmonary nodules <1cm, mostly in subpleural  location. Possible low-density enlarged mediastinal lymph nodes, bilaterally axillary adenopathy. Mild cardiomegaly. Small pericardial fluid collection.  4/29 ECHO: LVEF 60-65%. Grade I DD. Significant trabeculations in RV apex. Moderately reduced RV systolic function. Severely elevated pulmonary artery systolic pressure. RVSP 64 mmHg.  Moderate TR. RAP estimated   4/30 RHC: Moderately severely elevated right-sided filling pressures with moderate to severe pulmonary hypertension and normal cardiac output RA: 16 mmHg RV: 60 / 11 mmHg Pulmonary capillary wedge pressure: 16 mmHg PA: 66/26 with a mean of 40 mmHg Ao sat 92% PA sat 63% Cardiac output is 4.92 with a cardiac index of 2.86. Pulmonary vascular resistance: 4.9  Micro Data:  SARS Cov2> neg  HIV negative 02/21/20  Antimicrobials:     Interim history/subjective:  S/p RHC   Objective   Blood pressure 125/82, pulse 95, temperature 98.3 F (36.8 C), temperature source Oral, resp. rate 18, height 5\' 2"  (1.575 m), weight 63.5 kg, SpO2 91 %.        Intake/Output Summary (Last 24 hours) at 02/23/2020 2051 Last data filed at 02/23/2020 1955 Gross per 24 hour  Intake 200 ml  Output 5600 ml  Net -5400 ml   Filed Weights   02/22/20 0636 02/23/20 0610 02/23/20 1000  Weight: 73.4 kg 69.3 kg 63.5 kg    Examination: General: Chronically ill appearing woman laying in bed in NAD. Feculent smelling breath. HENT: [place]/AT, eyes anicteric Lungs: distant breath sounds, CTAB. Breathing comfortably on [place]. Cardiovascular: tachycardic, regular rhythm. 5cm JVD Abdomen: umbilical hernia, distended abdomen. Hepatomegaly. Extremities: mild LE pitting edema; +clubbing  Neuro: awake  and alert, answering questions appropriately, moving all extremities spontaneously Derm: no significant bruising or bleeding  Resolved Hospital Problem list     Assessment & Plan:   Acute respiratory failure with hypoxia COPD- emphysematous changes on CT,  non-compliant with outpatient medications (previously prescribed Breo) Pulmonary hypertension- possibly WHO group III due to lung disease, but this seems unlikely to fully explain her presentation without long-standing hypoxia, which is usually associated with more significant chronic pulmonary symptoms. Possibly a component of WHO group II with mildly elevated LVEDP, but this also seems disproportionate. Trabeculations on RV apex raise potential for cardiac disease. Sarcoidosis is possible with pulmonary nodules & adenopathy. -recommend RUQ [name] to evaluate for cirrhosis-- ordered -supplemental O2 to maintain SpO2 >90% -ongoing diuresis -cardiac MRI pending -needs VQ scan to evaluate for CTEPH -consider OP PSG to evaluate for OSA -depending on the results of the above studies, may be a candidate for ERA + PDEi, although her BP is unlikely to tolerate initiating these concurrently   Multiple sub-centimeter pulmonary nodules Axillary lymphadenopathy, possible mediastinal lymphadenopathy- unclear if this is due to vascular congestion. Sarcoidosis is possible, which could explain PH. -OP follow up for follow up CT in 3-6 months for multiple nodules -may need EBUS to evaluate for sarcoidosis  Tobacco use disorder -agree with nicotine patch -smoking cessation counseling today  Alcohol use disorder - reports was remote -monitor for signs of withdrawal -supplemental vitamins  BLE edema likely 2/2 acute heart failure -BLE dopplers pending to r/o DVT   Best practice:  Diet: reg Pain/Anxiety/Delirium protocol (if indicated): na VAP protocol (if indicated): na DVT prophylaxis: lovenox  GI prophylaxis: na Glucose control: na Mobility: post-RHC  Code Status: Full  Family Communication: primary team  Disposition: cardiac tele   Labs   CBC: Recent Labs  Lab 02/21/20 0829 02/21/20 0839 02/22/20 0549 02/23/20 0536 02/23/20 0918  WBC 7.7  --  6.9 7.5  --   NEUTROABS 3.6  --   --  3.5  --    HGB 12.4 15.0  15.0 11.4* 11.8* 13.9  13.9  HCT 41.7 44.0  44.0 38.4 39.2 41.0  41.0  MCV 86.5  --  86.1 86.2  --   PLT 209  --  184 200  --     Basic Metabolic Panel: Recent Labs  Lab 02/21/20 0829 02/21/20 0829 02/21/20 0839 02/21/20 1316 02/22/20 0549 02/23/20 0536 02/23/20 0918  NA 142  --  142  142  --  140 141  138 142  142  K 4.3   < > 6.8*  6.8* 3.8 4.0 3.7  3.7 3.9  3.8  CL 115*  --  110  110  --  110 102  102  --   CO2 22  --   --   --  25 32  29  --   GLUCOSE 78  --  72  72  --  92 91  87  --   BUN 19  --  27*  27*  --  17 16  16   --   CREATININE 1.35*  --  1.30*  1.30*  --  1.08* 1.30*  1.31*  --   CALCIUM 8.3*  --   --   --  8.0* 8.0*  7.8*  --   MG 1.8  --   --   --  1.6* 1.9  --   PHOS  --   --   --   --  4.2  --   --    < > =  values in this interval not displayed.   GFR: Estimated Creatinine Clearance: 44.9 mL/min (A) (by C-G formula based on SCr of 1.3 mg/dL (H)). Recent Labs  Lab 02/21/20 0829 02/22/20 0549 02/23/20 0536  WBC 7.7 6.9 7.5    Liver Function Tests: Recent Labs  Lab 02/21/20 0829 02/22/20 0549 02/23/20 0536  AST 33 25 21  ALT 21 20 18   ALKPHOS 99 90 85  BILITOT 0.7 0.9 1.1  PROT 7.3 6.7 6.7  ALBUMIN 2.9* 2.7* 2.7*   No results for input(s): LIPASE, AMYLASE in the last 168 hours. No results for input(s): AMMONIA in the last 168 hours.  ABG    Component Value Date/Time   PHART 7.424 (H) 10/04/2010 0930   PCO2ART 41.6 10/04/2010 0930   PO2ART 59.9 (L) 10/04/2010 0930   HCO3 35.3 (H) 02/23/2020 0918   HCO3 34.8 (H) 02/23/2020 0918   TCO2 37 (H) 02/23/2020 0918   TCO2 37 (H) 02/23/2020 0918   ACIDBASEDEF 2.3 (H) 09/12/2010 0501   O2SAT 62.0 02/23/2020 0918   O2SAT 63.0 02/23/2020 0918     Coagulation Profile: Recent Labs  Lab 02/21/20 0829 02/22/20 0549  INR 1.2 1.2    Cardiac Enzymes: No results for input(s): CKTOTAL, CKMB, CKMBINDEX, TROPONINI in the last 168 hours.  HbA1C: Hgb A1c MFr  Bld  Date/Time Value Ref Range Status  02/21/2020 01:16 PM 6.4 (H) 4.8 - 5.6 % Final    Comment:    (NOTE) Pre diabetes:          5.7%-6.4% Diabetes:              >6.4% Glycemic control for   <7.0% adults with diabetes     CBG: No results for input(s): GLUCAP in the last 168 hours.  Review of Systems:   + breast pain, abdominal distention, leg edema, SOB, orthopnea Otherwise negative ROS  Past Medical History  She,  has a past medical history of Bronchitis.   Surgical History    Past Surgical History:  Procedure Laterality Date  . ABDOMINAL HYSTERECTOMY    . RIGHT HEART CATH N/A 02/23/2020   Procedure: RIGHT HEART CATH;  Surgeon: 02/25/2020, MD;  Location: [facility];  Service: Cardiovascular;  Laterality: N/A;     Social History   reports that she has been smoking cigarettes. She has never used smokeless tobacco. She reports current alcohol use. She reports that she does not use drugs.   Family History   Her family history includes Multiple sclerosis in her mother; Prostate cancer in her father; Stroke in her father.   Allergies No Known Allergies   Home Medications  Prior to Admission medications   Medication Sig Start Date End Date Taking? Authorizing Provider  ipratropium-albuterol (DUONEB) 0.5-2.5 (3) MG/3ML SOLN Take 3 mLs by nebulization every 6 (six) hours as needed. 07/28/17  Yes 09/27/17, PA-C  albuterol (PROVENTIL HFA;VENTOLIN HFA) 108 (90 Base) MCG/ACT inhaler Inhale 2 puffs into the lungs every 4 (four) hours as needed for wheezing or shortness of breath. 07/28/17 08/27/17  13/2/18, PA-C  fluticasone furoate-vilanterol (BREO ELLIPTA) 200-25 MCG/INH AEPB Inhale 1 puff into the lungs daily. Patient not taking: Reported on 02/21/2020 07/28/17   09/27/17, PA-C  ibuprofen (ADVIL,MOTRIN) 400 MG tablet Take 1 tablet (400 mg total) by mouth every 8 (eight) hours as needed for pain. Patient not taking: Reported on 07/22/2017  01/13/13   01/15/13, MD      Jimmie Molly  Junior Kenedy, DO 02/23/20 8:51 PM [facility]

## 2020-02-23 NOTE — Plan of Care (Signed)
  Problem: Education: Goal: Knowledge of General Education information will improve Description: Including pain rating scale, medication(s)/side effects and non-pharmacologic comfort measures Outcome: Progressing   Problem: Health Behavior/Discharge Planning: Goal: Ability to manage health-related needs will improve Outcome: Progressing   Problem: Clinical Measurements: Goal: Ability to maintain clinical measurements within normal limits will improve Outcome: Progressing   Problem: Clinical Measurements: Goal: Will remain free from infection Outcome: Progressing   Problem: Clinical Measurements: Goal: Respiratory complications will improve Outcome: Progressing   

## 2020-02-23 NOTE — Progress Notes (Signed)
PROGRESS NOTE    Michele Parrish  XHB:[id number] DOB: [birth year] DOA: 02/21/2020 PCP: Michele Specter, PA-C    Chief Complaint  Patient presents with  . Shortness of Breath    Brief Narrative:  HPI per Dr. Andi Parrish is a 54 y.o. female with past medical history of chronic bronchitis supposed to be on nebulizers but not on it, presented to hospital with complaints of worsening shortness of breath for the last 3 weeks.  Patient stated that has been getting worse recently and was unable to sleep yesterday due to dyspnea suggestive of orthopnea..  She takes few steps and is very short winded.  She has been noticing swelling of her bilateral breasts mostly on the left and her abdomen girth has increased as well.  She also notices increasing leg swelling bilaterally.  Patient currently smokes. Patient denied any chest pain, sputum production, phlegm production.  No fever, chills or rigor.  No urinary urgency, frequency or dysuria.  Denies any changes in her bowel habits.  Denies sick contacts or recent travel.  ED Course: Patient was noted to be hypoxic saturating 80% on room air by EMS.  In the ED, patient complained of dyspnea and was put on supplemental oxygen at 5 L with saturation around 97%.  Patient was then considered for admission to the hospital.    Assessment & Plan:   Principal Problem:   Acute hypoxemic respiratory failure (HCC) Active Problems:   Acute diastolic CHF (congestive heart failure) (HCC)   Acute exacerbation of CHF (congestive heart failure) (HCC)   Moderate to severe pulmonary hypertension (HCC)   COPD with chronic bronchitis (HCC)   Hypomagnesemia   AKI (acute kidney injury) (HCC)   Anasarca   Tobacco abuse   Pulmonary nodules   Debility  1 acute hypoxic respiratory failure secondary to acute diastolic heart failure/right heart failure in the setting of severe pulmonary hypertension Likely secondary to acute diastolic heart failure/right  heart failure in the setting of severe pulmonary hypertension possibly cor pulmonale with history of COPD.  BNP noted to be elevated on admission at 986 and down to 846.  Cardiac enzymes negative.  2D echo with EF of 60 to 65%, no wall motion abnormalities, grade 1 diastolic dysfunction, significant trabeculations in the right ventricular apex, right ventricular dysfunction, right ventricle size mildly enlarged, severe elevated pulmonary artery systolic pressure with estimated right ventricular systolic pressure of 64 mmHg.  Right atrial size severely dilated.  Patient with some improvement with her respiratory status after brisk diuresis.  Patient with anasarca with lower extremity edema.  CT angiogram chest negative for PE, small right pleural effusion with bibasilar atelectasis, multiple small bilateral subcentimeter pulmonary nodules mostly in subpleural location, mild to moderate cardiomegaly with small amount of pericardial fluid.  Diffuse subcutaneous edema.  Patient with a urine output of 9.650 L over the past 24 hours and patient is - 13.050 L during this hospitalization.  Current weight of 152.78 pounds from 161.82 pounds from 162.26 pounds on admission.  TSH slightly elevated at 4.561.  Patient Lasix was increased to 80 mg IV every 12 hours per cardiology yesterday 02/22/2020.  Cardiology decreasing Lasix back to 40 mg IV every 12 hours.  Due to abnormal 2D echo, severe pulmonary hypertension, anasarca concerned that patient may need further cardiac evaluation with probable right heart cath and possible cardiac MRI and as such cardiology consulted.  Patient seen in consultation by cardiology and patient going to [facility] today  for right heart cath and cardiac MRI.  Appreciate cardiology input and recommendations.  2.  Acute kidney injury Likely secondary to problem #1.  Renal function improving with diuresis.  Follow.  3.  Hypomagnesemia Magnesium at 1.9.  Follow.   4.  Multiple  pulmonary nodules Questionable etiology.  Will likely need outpatient follow-up with pulmonary with repeat CT scan in about 3 months.  5.  Anasarca/hypoalbuminemia Questionable etiology.  TSH slightly elevated.  Will place on albumin IV every 6 hours x1 day.  Follow.  6.  History of chronic bronchitis/copd Patient supposedly was supposed to be on nebulizers and inhalers but has not used them in a while.  Patient with some mild expiratory wheezing.  Placed on Pulmicort twice daily, scheduled duo nebs, continue Mucinex.  We will give a dose of Solu-Medrol 60 mg IV x1.  Follow.    7.  Tobacco abuse Tobacco cessation.  Continue nicotine patch.  8.  Alcohol use disorder Patient with no signs or symptoms of withdrawal.  Continue thiamine, folic acid, multivitamin.  On Ativan as needed.  9.  Debility/weakness PT/OT.  10.  Lower extremity edema Likely secondary to problem #1.  Patient however now with 2+ left lower extremity edema and trace edema in the right lower extremity.  Check lower extremity Dopplers to rule out DVT.  Follow.    DVT prophylaxis: Lovenox Code Status: Full Family Communication: Updated patient.  No family at bedside. Disposition:   Status is: Inpatient    Dispo: The patient is from: Home              Anticipated d/c is to: Likely home.              Anticipated d/c date is: To be determined.              Patient currently on IV Lasix, significantly volume overloaded on examination, was in mild acute respiratory distress, abnormal 2D echo, going to [facility] for right heart cath and cardiac MRI today.  Cardiology consulted and following.  Work-up still underway.        Consultants:   Cardiology: Dr. Antoine Poche 02/22/2020  Procedures:   2D echo 02/21/2020  Chest x-ray 02/21/2020  CT angiogram chest 02/21/2020  Right heart cath pending 02/23/2020  Antimicrobials:   None   Subjective: Patient laying in bed.  States she feels shortness of breath is  improving.  Still with swelling in her breast per patient.  Denies any chest pain.  Hesitant to go to [facility] due to the fact that she experienced a 5-hour wait in the hallway in the ED.  No nausea or emesis.  Currently on 2 L nasal cannula with sats of 90 to 91%.    Objective: Vitals:   02/22/20 2034 02/22/20 2249 02/23/20 0252 02/23/20 0610  BP: 97/78 109/80 101/74 111/83  Pulse: 100 100 92 91  Resp: (!) 28 (!) 28 (!) 28 (!) 25  Temp: 98.2 F (36.8 C) 99.7 F (37.6 C) 98.6 F (37 C) 97.8 F (36.6 C)  TempSrc: Oral Oral Oral Oral  SpO2: 95% 92% 90% 91%  Weight:    69.3 kg  Height:        Intake/Output Summary (Last 24 hours) at 02/23/2020 0817 Last data filed at 02/23/2020 1610 Gross per 24 hour  Intake --  Output 9650 ml  Net -9650 ml   Filed Weights   02/21/20 2056 02/22/20 0636 02/23/20 0610  Weight: 73.6 kg 73.4 kg 69.3 kg  Examination:  General exam: Less visibly short of breath.  Laying in bed.  Decreasing edema to bilateral breasts.   Respiratory system: Bibasilar crackles.  Fair air movement.  Minimal to mild expiratory wheezing.  Speaking in full sentences.  Improving respiratory effort.  Cardiovascular system: RRR.  Positive JVD.  No murmurs rubs or gallops.  2+ left lower extremity edema, trace right lower extremity edema.   Gastrointestinal system: Abdomen is soft, nontender, nondistended, positive bowel sounds.  No rebound.  No guarding.  Central nervous system: Alert and oriented. No focal neurological deficits. Extremities: 2+ left lower extremity edema, trace right lower extremity edema.  Skin: No rashes, lesions or ulcers Psychiatry: Judgement and insight appear normal. Mood & affect appropriate.     Data Reviewed: I have personally reviewed following labs and imaging studies  CBC: Recent Labs  Lab 02/21/20 0829 02/21/20 0839 02/22/20 0549 02/23/20 0536  WBC 7.7  --  6.9 7.5  NEUTROABS 3.6  --   --  3.5  HGB 12.4 15.0  15.0 11.4* 11.8*   HCT 41.7 44.0  44.0 38.4 39.2  MCV 86.5  --  86.1 86.2  PLT 209  --  184 433    Basic Metabolic Panel: Recent Labs  Lab 02/21/20 0829 02/21/20 0839 02/21/20 1316 02/22/20 0549 02/23/20 0536  NA 142 142  142  --  140 141  138  K 4.3 6.8*  6.8* 3.8 4.0 3.7  3.7  CL 115* 110  110  --  110 102  102  CO2 22  --   --  25 32  29  GLUCOSE 78 72  72  --  92 91  87  BUN 19 27*  27*  --  17 16  16   CREATININE 1.35* 1.30*  1.30*  --  1.08* 1.30*  1.31*  CALCIUM 8.3*  --   --  8.0* 8.0*  7.8*  MG 1.8  --   --  1.6* 1.9  PHOS  --   --   --  4.2  --     GFR: Estimated Creatinine Clearance: 47.8 mL/min (A) (by C-G formula based on SCr of 1.3 mg/dL (H)).  Liver Function Tests: Recent Labs  Lab 02/21/20 0829 02/22/20 0549 02/23/20 0536  AST 33 25 21  ALT 21 20 18   ALKPHOS 99 90 85  BILITOT 0.7 0.9 1.1  PROT 7.3 6.7 6.7  ALBUMIN 2.9* 2.7* 2.7*    CBG: No results for input(s): GLUCAP in the last 168 hours.   Recent Results (from the past 240 hour(s))  Respiratory Panel by RT PCR (Flu A&B, Covid) - Nasopharyngeal Swab     Status: None   Collection Time: 02/21/20 10:18 AM   Specimen: Nasopharyngeal Swab  Result Value Ref Range Status   SARS Coronavirus 2 by RT PCR NEGATIVE NEGATIVE Final    Comment: (NOTE) SARS-CoV-2 target nucleic acids are NOT DETECTED. The SARS-CoV-2 RNA is generally detectable in upper respiratoy specimens during the acute phase of infection. The lowest concentration of SARS-CoV-2 viral copies this assay can detect is 131 copies/mL. A negative result does not preclude SARS-Cov-2 infection and should not be used as the sole basis for treatment or other patient management decisions. A negative result may occur with  improper specimen collection/handling, submission of specimen other than nasopharyngeal swab, presence of viral mutation(s) within the areas targeted by this assay, and inadequate number of viral copies (<131 copies/mL). A  negative result must be combined with clinical observations, patient  history, and epidemiological information. The expected result is Negative. Fact Sheet for Patients:  https://www.moore.com/ Fact Sheet for Healthcare Providers:  https://www.young.biz/ This test is not yet ap proved or cleared by the [place] FDA and  has been authorized for detection and/or diagnosis of SARS-CoV-2 by FDA under an Emergency Use Authorization (EUA). This EUA will remain  in effect (meaning this test can be used) for the duration of the COVID-19 declaration under Section 564(b)(1) of the Act, 21 U.S.C. section 360bbb-3(b)(1), unless the authorization is terminated or revoked sooner.    Influenza A by PCR NEGATIVE NEGATIVE Final   Influenza B by PCR NEGATIVE NEGATIVE Final    Comment: (NOTE) The Xpert Xpress SARS-CoV-2/FLU/RSV assay is intended as an aid in  the diagnosis of influenza from Nasopharyngeal swab specimens and  should not be used as a sole basis for treatment. Nasal washings and  aspirates are unacceptable for Xpert Xpress SARS-CoV-2/FLU/RSV  testing. Fact Sheet for Patients: https://www.moore.com/ Fact Sheet for Healthcare Providers: https://www.young.biz/ This test is not yet approved or cleared by the [place] FDA and  has been authorized for detection and/or diagnosis of SARS-CoV-2 by  FDA under an Emergency Use Authorization (EUA). This EUA will remain  in effect (meaning this test can be used) for the duration of the  Covid-19 declaration under Section 564(b)(1) of the Act, 21  U.S.C. section 360bbb-3(b)(1), unless the authorization is  terminated or revoked. Performed at [facility], [address], [place], [place] [address]   Urine culture     Status: None   Collection Time: 02/21/20  3:36 PM   Specimen: Urine, Random  Result Value Ref Range Status   Specimen  Description   Final    URINE, RANDOM Performed at [facility], [address] [place], [place] [address]    Special Requests   Final    NONE Performed at [facility], [address] [place], [place] [address]    Culture   Final    NO GROWTH Performed at [facility], [address], [place], [place] [address]    Report Status 02/22/2020 FINAL  Final         Radiology Studies: CT Angio Chest PE W and/or Wo Contrast  Result Date: 02/21/2020 CLINICAL DATA:  Shortness of breath with hypoxia. Left lower extremity swelling. Symptoms 2-3 weeks. EXAM: CT ANGIOGRAPHY CHEST WITH CONTRAST TECHNIQUE: Multidetector CT imaging of the chest was performed using the standard protocol during bolus administration of intravenous contrast. Multiplanar CT image reconstructions and MIPs were obtained to evaluate the vascular anatomy. CONTRAST:  23mL OMNIPAQUE IOHEXOL 350 MG/ML SOLN COMPARISON:  09/11/2010 FINDINGS: Cardiovascular: There is mild to moderate cardiomegaly. Small amount of pericardial fluid is present. Mild prominence of the main pulmonary artery segment. No evidence of pulmonary emboli. Thoracic aorta is normal in caliber. Mediastinum/Nodes: Possible 1.4 cm AP window lymph node and 1.1 cm precarinal lymph node. No significant hilar adenopathy. Remaining mediastinal structures are unremarkable. Lungs/Pleura: Examination demonstrates centrilobular emphysematous disease. Linear density over the right base likely atelectasis. Small right pleural effusion is present. Mild linear atelectasis/scarring over the right middle lobe. There are several bilateral subcentimeter pulmonary nodules most subpleural in location with the largest over the lateral right lower lobe measuring 8 mm in greatest diameter (image 101 series 7). Airways are normal. Upper Abdomen: No acute findings. Musculoskeletal: Diffuse subcutaneous edema over the chest. Suggestion of mild bilateral  symmetric axillary adenopathy. No focal bony  abnormality. Bone island over the lower thoracic spine unchanged. Review of the MIP images confirms the above findings. IMPRESSION: 1.  No evidence of pulmonary embolism. 2.  Small right pleural effusion with bibasilar atelectasis. 3. Multiple small bilateral subcentimeter pulmonary nodules mostly in subpleural location with the largest measuring 8 mm in greatest dimension over the lateral right lower lobe. Couple possible low-density slightly enlarged mediastinal lymph nodes as well as mild symmetric bilateral axillary adenopathy. Findings may be seen with metastatic disease as recommend clinical correlation and at least follow-up chest CT 3 months. 4. Mild-to-moderate cardiomegaly with small amount of pericardial fluid. Diffuse subcutaneous edema. Electronically Signed   By: Elberta Fortisaniel  Boyle M.D.   On: 02/21/2020 10:54   DG Chest Portable 1 View  Result Date: 02/21/2020 CLINICAL DATA:  Shortness of breath. EXAM: PORTABLE CHEST 1 VIEW COMPARISON:  November 06, 2010. FINDINGS: Stable cardiomegaly. No pneumothorax or pleural effusion is noted. Both lungs are clear. The visualized skeletal structures are unremarkable. IMPRESSION: No active disease. Electronically Signed   By: Lupita RaiderJames  Green Jr M.D.   On: 02/21/2020 08:45   ECHOCARDIOGRAM COMPLETE  Result Date: 02/21/2020    ECHOCARDIOGRAM REPORT   Patient Name:   Pedro EarlsLINETTE E Large Date of Exam: 02/21/2020 Medical Rec #:  [id number]        Height:       63.0 in Accession #:    [id number]       Weight:       143.3 lb Date of Birth:  October 04, 1966        BSA:          1.678 m Patient Age:    53 years         BP:           123/91 mmHg Patient Gender: F                HR:           97 bpm. Exam Location:  Inpatient Procedure: 2D Echo, Color Doppler and Cardiac Doppler Indications:    I50.31 Acute diastolic (congestive) heart failure  History:        Patient has no prior history of Echocardiogram examinations.                 COPD.   Sonographer:    Irving BurtonEmily Senior RDCS Referring Phys: [phone] [facility] Corporation[facility]MAN POKHREL IMPRESSIONS  1. Left ventricular ejection fraction, by estimation, is 60 to 65%. The left ventricle has normal function. The left ventricle has no regional wall motion abnormalities. Left ventricular diastolic parameters are consistent with Grade I diastolic dysfunction (impaired relaxation).  2. There appear to be significant trabeculations in the RV apex. Given RV dysfunction, recommend cardiac MRI to evaluate RV further. Right ventricular systolic function is moderately reduced. The right ventricular size is mildly enlarged. There is severely elevated pulmonary artery systolic pressure. The estimated right ventricular systolic pressure is 64.0 mmHg.  3. Right atrial size was severely dilated.  4. The mitral valve is normal in structure. No evidence of mitral valve regurgitation. No evidence of mitral stenosis.  5. Tricuspid valve regurgitation is moderate.  6. The aortic valve is normal in structure. Aortic valve regurgitation is not visualized. No aortic stenosis is present.  7. The inferior vena cava is dilated in size with <50% respiratory variability, suggesting right atrial pressure of 15 mmHg. FINDINGS  Left Ventricle: Left ventricular ejection fraction, by estimation, is 60 to 65%. The left ventricle has normal function. The  left ventricle has no regional wall motion abnormalities. The left ventricular internal cavity size was normal in size. There is  no left ventricular hypertrophy. Left ventricular diastolic parameters are consistent with Grade I diastolic dysfunction (impaired relaxation). Normal left ventricular filling pressure. Right Ventricle: There appear to be significant trabeculations in the RV apex. Given RV dysfunction, recommend cardiac MRI to evaluate RV further. The right ventricular size is mildly enlarged. No increase in right ventricular wall thickness. Right ventricular systolic function is moderately reduced.  There is severely elevated pulmonary artery systolic pressure. The tricuspid regurgitant velocity is 3.50 m/s, and with an assumed right atrial pressure of 15 mmHg, the estimated right ventricular systolic pressure is 64.0 mmHg. Left Atrium: Left atrial size was normal in size. Right Atrium: Right atrial size was severely dilated. Pericardium: A small pericardial effusion is present. The pericardial effusion is circumferential. Mitral Valve: The mitral valve is normal in structure. Normal mobility of the mitral valve leaflets. No evidence of mitral valve regurgitation. No evidence of mitral valve stenosis. Tricuspid Valve: The tricuspid valve is normal in structure. Tricuspid valve regurgitation is moderate . No evidence of tricuspid stenosis. Aortic Valve: The aortic valve is normal in structure. Aortic valve regurgitation is not visualized. No aortic stenosis is present. Pulmonic Valve: The pulmonic valve was normal in structure. Pulmonic valve regurgitation is trivial. No evidence of pulmonic stenosis. Aorta: The aortic root is normal in size and structure. Venous: The inferior vena cava is dilated in size with less than 50% respiratory variability, suggesting right atrial pressure of 15 mmHg. IAS/Shunts: No atrial level shunt detected by color flow Doppler.  LEFT VENTRICLE PLAX 2D LVIDd:         3.40 cm  Diastology LVIDs:         2.20 cm  LV e' lateral:   9.57 cm/s LV PW:         0.80 cm  LV E/e' lateral: 6.2 LV IVS:        0.90 cm  LV e' medial:    8.05 cm/s LVOT diam:     1.80 cm  LV E/e' medial:  7.4 LV SV:         32 LV SV Index:   19 LVOT Area:     2.54 cm  RIGHT VENTRICLE RV S prime:     8.49 cm/s TAPSE (M-mode): 1.2 cm LEFT ATRIUM             Index       RIGHT ATRIUM           Index LA diam:        3.60 cm 2.15 cm/m  RA Area:     22.00 cm LA Vol (A2C):   47.3 ml 28.19 ml/m RA Volume:   65.30 ml  38.91 ml/m LA Vol (A4C):   28.8 ml 17.16 ml/m LA Biplane Vol: 38.4 ml 22.88 ml/m  AORTIC VALVE LVOT  Vmax:   76.10 cm/s LVOT Vmean:  52.300 cm/s LVOT VTI:    0.126 m  AORTA Ao Root diam: 2.60 cm Ao Asc diam:  2.80 cm MITRAL VALVE               TRICUSPID VALVE MV Area (PHT): 2.96 cm    TR Peak grad:   49.0 mmHg MV Decel Time: 256 msec    TR Vmax:        350.00 cm/s MV E velocity: 59.70 cm/s MV A velocity: 78.60 cm/s  SHUNTS MV E/A ratio:  0.76        Systemic VTI:  0.13 m                            Systemic Diam: 1.80 cm Armanda Magic MD Electronically signed by Armanda Magic MD Signature Date/Time: 02/21/2020/3:33:22 PM    Final         Scheduled Meds: . Chlorhexidine Gluconate Cloth  6 each Topical Daily  . docusate sodium  100 mg Oral BID  . enoxaparin (LOVENOX) injection  40 mg Subcutaneous Q24H  . feeding supplement (ENSURE ENLIVE)  237 mL Oral BID BM  . fluticasone furoate-vilanterol  1 puff Inhalation Daily  . folic acid  1 mg Oral Daily  . furosemide  40 mg Intravenous BID  . guaiFENesin  600 mg Oral BID  . multivitamin with minerals  1 tablet Oral Daily  . nicotine  14 mg Transdermal Daily  . sodium chloride flush  3 mL Intravenous Q12H  . thiamine  100 mg Oral Daily   Continuous Infusions: . sodium chloride    . sodium chloride       LOS: 2 days    Time spent: 35 minutes    Ramiro Harvest, MD [facility]   To contact the attending provider between 7A-7P or the covering provider during after hours 7P-7A, please log into the web site [email or web address] and access using universal [place] password for that web site. If you do not have the password, please call the hospital operator.  02/23/2020, 8:17 AM

## 2020-02-23 NOTE — Interval H&P Note (Signed)
History and Physical Interval Note:  02/23/2020 9:01 AM  Michele Parrish  has presented today for surgery, with the diagnosis of right heart failure.  The various methods of treatment have been discussed with the patient and family. After consideration of risks, benefits and other options for treatment, the patient has consented to  Procedure(s): RIGHT HEART CATH (N/A) as a surgical intervention.  The patient's history has been reviewed, patient examined, no change in status, stable for surgery.  I have reviewed the patient's chart and labs.  Questions were answered to the patient's satisfaction.     [name]

## 2020-02-23 NOTE — Progress Notes (Addendum)
Progress Note  Patient Name: Michele EarlsLinette E Parrish Date of Encounter: 02/23/2020  Primary Cardiologist: Rollene RotundaJames Thoren Hosang, MD   Subjective   Pt states she may be breathing a little better, still not a baseline after significant diuresis. Breast edema may be mildly better.  Inpatient Medications    Scheduled Meds: . Chlorhexidine Gluconate Cloth  6 each Topical Daily  . docusate sodium  100 mg Oral BID  . enoxaparin (LOVENOX) injection  40 mg Subcutaneous Q24H  . feeding supplement (ENSURE ENLIVE)  237 mL Oral BID BM  . fluticasone furoate-vilanterol  1 puff Inhalation Daily  . folic acid  1 mg Oral Daily  . furosemide  80 mg Intravenous BID  . guaiFENesin  600 mg Oral BID  . multivitamin with minerals  1 tablet Oral Daily  . nicotine  14 mg Transdermal Daily  . sodium chloride flush  3 mL Intravenous Q12H  . thiamine  100 mg Oral Daily   Continuous Infusions: . sodium chloride    . sodium chloride     PRN Meds: sodium chloride, acetaminophen **OR** acetaminophen, bisacodyl, ipratropium-albuterol, LORazepam, ondansetron **OR** ondansetron (ZOFRAN) IV, polyethylene glycol, sodium chloride flush   Vital Signs    Vitals:   02/22/20 2034 02/22/20 2249 02/23/20 0252 02/23/20 0610  BP: 97/78 109/80 101/74 111/83  Pulse: 100 100 92 91  Resp: (!) 28 (!) 28 (!) 28 (!) 25  Temp: 98.2 F (36.8 C) 99.7 F (37.6 C) 98.6 F (37 C) 97.8 F (36.6 C)  TempSrc: Oral Oral Oral Oral  SpO2: 95% 92% 90% 91%  Weight:    69.3 kg  Height:        Intake/Output Summary (Last 24 hours) at 02/23/2020 0742 Last data filed at 02/23/2020 [id number] Gross per 24 hour  Intake --  Output 9650 ml  Net -9650 ml   Last 3 Weights 02/23/2020 02/22/2020 02/21/2020  Weight (lbs) 152 lb 12.5 oz 161 lb 13.1 oz 162 lb 4.1 oz  Weight (kg) 69.3 kg 73.4 kg 73.6 kg      Telemetry    Sinus rhythm - sinus tachycardia with HR 90-100s - Personally Reviewed  ECG    No new tracings - Personally Reviewed  Physical  Exam   GEN: No acute distress.   Neck: No JVD Cardiac: RRR, no murmurs, rubs, or gallops.  Respiratory: respirations unlabored, do not appreciate crackles GI: Soft, nontender, non-distended  MS: 1+ B LE edema; No deformity. Neuro:  Nonfocal  Psych: Normal affect  Breasts: bilateral edema with dimpling, appear mildly better today  Labs    High Sensitivity Troponin:   Recent Labs  Lab 02/21/20 1316 02/21/20 1605  TROPONINIHS 16 15      Chemistry Recent Labs  Lab 02/21/20 0829 02/21/20 0829 02/21/20 0839 02/21/20 0839 02/21/20 1316 02/22/20 0549 02/23/20 0536  NA 142   < > 142  142  --   --  140 141  138  K 4.3   < > 6.8*  6.8*   < > 3.8 4.0 3.7  3.7  CL 115*   < > 110  110  --   --  110 102  102  CO2 22  --   --   --   --  25 32  29  GLUCOSE 78   < > 72  72  --   --  92 91  87  BUN 19   < > 27*  27*  --   --  17 16  16  CREATININE 1.35*   < > 1.30*  1.30*  --   --  1.08* 1.30*  1.31*  CALCIUM 8.3*  --   --   --   --  8.0* 8.0*  7.8*  PROT 7.3  --   --   --   --  6.7 6.7  ALBUMIN 2.9*  --   --   --   --  2.7* 2.7*  AST 33  --   --   --   --  25 21  ALT 21  --   --   --   --  20 18  ALKPHOS 99  --   --   --   --  90 85  BILITOT 0.7  --   --   --   --  0.9 1.1  GFRNONAA 45*  --   --   --   --  59* 47*  47*  GFRAA 53*  --   --   --   --  >60 55*  55*  ANIONGAP 5  --   --   --   --  5 7  7    < > = values in this interval not displayed.     Hematology Recent Labs  Lab 02/21/20 0829 02/21/20 0829 02/21/20 0839 02/22/20 0549 02/23/20 0536  WBC 7.7  --   --  6.9 7.5  RBC 4.82  --   --  4.46 4.55  HGB 12.4   < > 15.0  15.0 11.4* 11.8*  HCT 41.7   < > 44.0  44.0 38.4 39.2  MCV 86.5  --   --  86.1 86.2  MCH 25.7*  --   --  25.6* 25.9*  MCHC 29.7*  --   --  29.7* 30.1  RDW 18.9*  --   --  18.3* 18.6*  PLT 209  --   --  184 200   < > = values in this interval not displayed.    BNP Recent Labs  Lab 02/21/20 0829 02/22/20 0549  BNP 986.4*  846.6*     DDimer No results for input(s): DDIMER in the last 168 hours.   Radiology    CT Angio Chest PE W and/or Wo Contrast  Result Date: 02/21/2020 CLINICAL DATA:  Shortness of breath with hypoxia. Left lower extremity swelling. Symptoms 2-3 weeks. EXAM: CT ANGIOGRAPHY CHEST WITH CONTRAST TECHNIQUE: Multidetector CT imaging of the chest was performed using the standard protocol during bolus administration of intravenous contrast. Multiplanar CT image reconstructions and MIPs were obtained to evaluate the vascular anatomy. CONTRAST:  47mL OMNIPAQUE IOHEXOL 350 MG/ML SOLN COMPARISON:  09/11/2010 FINDINGS: Cardiovascular: There is mild to moderate cardiomegaly. Small amount of pericardial fluid is present. Mild prominence of the main pulmonary artery segment. No evidence of pulmonary emboli. Thoracic aorta is normal in caliber. Mediastinum/Nodes: Possible 1.4 cm AP window lymph node and 1.1 cm precarinal lymph node. No significant hilar adenopathy. Remaining mediastinal structures are unremarkable. Lungs/Pleura: Examination demonstrates centrilobular emphysematous disease. Linear density over the right base likely atelectasis. Small right pleural effusion is present. Mild linear atelectasis/scarring over the right middle lobe. There are several bilateral subcentimeter pulmonary nodules most subpleural in location with the largest over the lateral right lower lobe measuring 8 mm in greatest diameter (image 101 series 7). Airways are normal. Upper Abdomen: No acute findings. Musculoskeletal: Diffuse subcutaneous edema over the chest. Suggestion of mild bilateral symmetric axillary adenopathy. No focal bony abnormality. Bone island over the  lower thoracic spine unchanged. Review of the MIP images confirms the above findings. IMPRESSION: 1.  No evidence of pulmonary embolism. 2.  Small right pleural effusion with bibasilar atelectasis. 3. Multiple small bilateral subcentimeter pulmonary nodules mostly in  subpleural location with the largest measuring 8 mm in greatest dimension over the lateral right lower lobe. Couple possible low-density slightly enlarged mediastinal lymph nodes as well as mild symmetric bilateral axillary adenopathy. Findings may be seen with metastatic disease as recommend clinical correlation and at least follow-up chest CT 3 months. 4. Mild-to-moderate cardiomegaly with small amount of pericardial fluid. Diffuse subcutaneous edema. Electronically Signed   By: Marin Olp M.D.   On: 02/21/2020 10:54   DG Chest Portable 1 View  Result Date: 02/21/2020 CLINICAL DATA:  Shortness of breath. EXAM: PORTABLE CHEST 1 VIEW COMPARISON:  November 06, 2010. FINDINGS: Stable cardiomegaly. No pneumothorax or pleural effusion is noted. Both lungs are clear. The visualized skeletal structures are unremarkable. IMPRESSION: No active disease. Electronically Signed   By: Marijo Conception M.D.   On: 02/21/2020 08:45   ECHOCARDIOGRAM COMPLETE  Result Date: 02/21/2020    ECHOCARDIOGRAM REPORT   Patient Name:   KEANA DUEITT Date of Exam: 02/21/2020 Medical Rec #:  [id number]        Height:       63.0 in Accession #:    [id number]       Weight:       143.3 lb Date of Birth:  04/10/1966        BSA:          1.678 m Patient Age:    3 years         BP:           123/91 mmHg Patient Gender: F                HR:           97 bpm. Exam Location:  Inpatient Procedure: 2D Echo, Color Doppler and Cardiac Doppler Indications:    M19.62 Acute diastolic (congestive) heart failure  History:        Patient has no prior history of Echocardiogram examinations.                 COPD.  Sonographer:    Raquel Sarna Senior RDCS Referring Phys: [id number] [place]  1. Left ventricular ejection fraction, by estimation, is 60 to 65%. The left ventricle has normal function. The left ventricle has no regional wall motion abnormalities. Left ventricular diastolic parameters are consistent with Grade I diastolic dysfunction  (impaired relaxation).  2. There appear to be significant trabeculations in the RV apex. Given RV dysfunction, recommend cardiac MRI to evaluate RV further. Right ventricular systolic function is moderately reduced. The right ventricular size is mildly enlarged. There is severely elevated pulmonary artery systolic pressure. The estimated right ventricular systolic pressure is 21.1 mmHg.  3. Right atrial size was severely dilated.  4. The mitral valve is normal in structure. No evidence of mitral valve regurgitation. No evidence of mitral stenosis.  5. Tricuspid valve regurgitation is moderate.  6. The aortic valve is normal in structure. Aortic valve regurgitation is not visualized. No aortic stenosis is present.  7. The inferior vena cava is dilated in size with <50% respiratory variability, suggesting right atrial pressure of 15 mmHg. FINDINGS  Left Ventricle: Left ventricular ejection fraction, by estimation, is 60 to 65%. The left ventricle has normal function. The left ventricle has no  regional wall motion abnormalities. The left ventricular internal cavity size was normal in size. There is  no left ventricular hypertrophy. Left ventricular diastolic parameters are consistent with Grade I diastolic dysfunction (impaired relaxation). Normal left ventricular filling pressure. Right Ventricle: There appear to be significant trabeculations in the RV apex. Given RV dysfunction, recommend cardiac MRI to evaluate RV further. The right ventricular size is mildly enlarged. No increase in right ventricular wall thickness. Right ventricular systolic function is moderately reduced. There is severely elevated pulmonary artery systolic pressure. The tricuspid regurgitant velocity is 3.50 m/s, and with an assumed right atrial pressure of 15 mmHg, the estimated right ventricular systolic pressure is 64.0 mmHg. Left Atrium: Left atrial size was normal in size. Right Atrium: Right atrial size was severely dilated. Pericardium: A  small pericardial effusion is present. The pericardial effusion is circumferential. Mitral Valve: The mitral valve is normal in structure. Normal mobility of the mitral valve leaflets. No evidence of mitral valve regurgitation. No evidence of mitral valve stenosis. Tricuspid Valve: The tricuspid valve is normal in structure. Tricuspid valve regurgitation is moderate . No evidence of tricuspid stenosis. Aortic Valve: The aortic valve is normal in structure. Aortic valve regurgitation is not visualized. No aortic stenosis is present. Pulmonic Valve: The pulmonic valve was normal in structure. Pulmonic valve regurgitation is trivial. No evidence of pulmonic stenosis. Aorta: The aortic root is normal in size and structure. Venous: The inferior vena cava is dilated in size with less than 50% respiratory variability, suggesting right atrial pressure of 15 mmHg. IAS/Shunts: No atrial level shunt detected by color flow Doppler.  LEFT VENTRICLE PLAX 2D LVIDd:         3.40 cm  Diastology LVIDs:         2.20 cm  LV e' lateral:   9.57 cm/s LV PW:         0.80 cm  LV E/e' lateral: 6.2 LV IVS:        0.90 cm  LV e' medial:    8.05 cm/s LVOT diam:     1.80 cm  LV E/e' medial:  7.4 LV SV:         32 LV SV Index:   19 LVOT Area:     2.54 cm  RIGHT VENTRICLE RV S prime:     8.49 cm/s TAPSE (M-mode): 1.2 cm LEFT ATRIUM             Index       RIGHT ATRIUM           Index LA diam:        3.60 cm 2.15 cm/m  RA Area:     22.00 cm LA Vol (A2C):   47.3 ml 28.19 ml/m RA Volume:   65.30 ml  38.91 ml/m LA Vol (A4C):   28.8 ml 17.16 ml/m LA Biplane Vol: 38.4 ml 22.88 ml/m  AORTIC VALVE LVOT Vmax:   76.10 cm/s LVOT Vmean:  52.300 cm/s LVOT VTI:    0.126 m  AORTA Ao Root diam: 2.60 cm Ao Asc diam:  2.80 cm MITRAL VALVE               TRICUSPID VALVE MV Area (PHT): 2.96 cm    TR Peak grad:   49.0 mmHg MV Decel Time: 256 msec    TR Vmax:        350.00 cm/s MV E velocity: 59.70 cm/s MV A velocity: 78.60 cm/s  SHUNTS MV E/A ratio:  0.76  Systemic VTI:  0.13 m                            Systemic Diam: 1.80 cm Armanda Magic MD Electronically signed by Armanda Magic MD Signature Date/Time: 02/21/2020/3:33:22 PM    Final     Cardiac Studies   Right heart cath  Right heart catheterization was done via the right antecubital vein without complication.  Moderately severely elevated right-sided filling pressures with moderate to severe pulmonary hypertension and normal cardiac output  RA: 16 mmHg RV: 60 / 11 mmHg Pulmonary capillary wedge pressure: 16 mmHg PA: 66/26 with a mean of 40 mmHg Ao sat 92% PA sat 63% Cardiac output is 4.92 with a cardiac index of 2.86. Pulmonary vascular resistance: 4.9 Wood units  Recommendations: Pulmonary hypertension seems to be mixed venous and arterial but mostly arterial given that wedge pressure is only 16.  Avoid overdiuresis. I switched furosemide to oral. Continue evaluation of pulmonary hypertension and RV dysfunction.  Echo 02/21/20: 1. Left ventricular ejection fraction, by estimation, is 60 to 65%. The  left ventricle has normal function. The left ventricle has no regional  wall motion abnormalities. Left ventricular diastolic parameters are  consistent with Grade I diastolic  dysfunction (impaired relaxation).  2. There appear to be significant trabeculations in the RV apex. Given RV  dysfunction, recommend cardiac MRI to evaluate RV further. Right  ventricular systolic function is moderately reduced. The right ventricular  size is mildly enlarged. There is  severely elevated pulmonary artery systolic pressure. The estimated right  ventricular systolic pressure is 64.0 mmHg.  3. Right atrial size was severely dilated.  4. The mitral valve is normal in structure. No evidence of mitral valve  regurgitation. No evidence of mitral stenosis.  5. Tricuspid valve regurgitation is moderate.  6. The aortic valve is normal in structure. Aortic valve regurgitation is  not  visualized. No aortic stenosis is present.  7. The inferior vena cava is dilated in size with <50% respiratory  variability, suggesting right atrial pressure of 15 mmHg.    Patient Profile     54 y.o. female with a hx of chronic bronchitis not on inhalers and COPD who is being seen for shortness of breath.  Assessment & Plan    Acute on chronic diastolic heart failure Moderate right ventricular heart dysfunction - as on echo, preserved EF, grade I DD, moderate RV dysfunction and trabeculations in the RV appex - BNP elevated on arrival to almost 1000 - she has significant swelling in her breasts bilaterally, lung exam is stable - plan for transfer to [name] today for right heart cath and cardiac MRI  - lasix increased to 80 mg IV BID with significant output --> 9.6 L urine output - weight down 10 lbs in 2 days - slight sCr bump to 1.30 - will cut back to 40 mg IV lasix BID today - appreciate input from Advanced Heart Failure team once at [facility]   AKI - sCr bumped overnight with diuresis: 1.30 (1.08) - reduced diuretic to 40 mg BID   Acute respiratory failure Chronic bronchitis, COPD - still on 4L [place]  - continue breathing treatments per primary    For questions or updates, please contact [facility] Please consult [email or web address] for contact info under        Signed, Marcelino Duster, PA  02/23/2020, 7:42 AM    History and all data above reviewed.  Patient examined.  I agree with the findings as above. She feels better with significant diuresis.  Cath results as above. Pulm HTN with NL LVEDP/[place].   The patient exam reveals COR:RRR  ,  Lungs: Improved aeration. Few basilar crackles  ,  Abd: Positive bowel sounds, no rebound no guarding, Ext Decreased edema  .  All available labs, radiology testing, previous records reviewed. Agree with documented assessment and plan.   Pulmonary HTN:  Will ask CCM to see.  Diuretic changed to oral.  She is feeling better.  Abnormal  echo:  Likely normal variant RV trabeculations but would like MRI to evaluate.  RV dysfunction secondary to increase pulmonary pressures.   CKD II:  Creat is bumped.  Follow with diuresis.    Fayrene Fearing Caidyn Blossom  10:02 AM  02/23/2020

## 2020-02-23 NOTE — Progress Notes (Signed)
PT Cancellation Note  Patient Details Name: Michele Parrish MRN: [id number] DOB: [birth year]   Cancelled Treatment:    Reason Eval/Treat Not Completed: Patient at procedure or test/unavailable.  Pt at [facility] for right heart cath.    [facility] 02/23/2020, 9:29 AM

## 2020-02-24 ENCOUNTER — Inpatient Hospital Stay (HOSPITAL_COMMUNITY): Payer: Medicaid Other

## 2020-02-24 DIAGNOSIS — I272 Pulmonary hypertension, unspecified: Secondary | ICD-10-CM

## 2020-02-24 LAB — COMPREHENSIVE METABOLIC PANEL
ALT: 13 U/L (ref 0–44)
AST: 17 U/L (ref 15–41)
Albumin: 3.5 g/dL (ref 3.5–5.0)
Alkaline Phosphatase: 70 U/L (ref 38–126)
Anion gap: 13 (ref 5–15)
BUN: 18 mg/dL (ref 6–20)
CO2: 31 mmol/L (ref 22–32)
Calcium: 8.6 mg/dL — ABNORMAL LOW (ref 8.9–10.3)
Chloride: 99 mmol/L (ref 98–111)
Creatinine, Ser: 1.05 mg/dL — ABNORMAL HIGH (ref 0.44–1.00)
GFR calc Af Amer: >60 mL/min (ref >60)
GFR calc non Af Amer: >60 mL/min (ref >60)
Glucose, Bld: 89 mg/dL (ref 70–99)
Potassium: 4.1 mmol/L (ref 3.5–5.1)
Sodium: 143 mmol/L (ref 135–145)
Total Bilirubin: 1 mg/dL (ref 0.3–1.2)
Total Protein: 6.8 g/dL (ref 6.5–8.1)

## 2020-02-24 LAB — CBC
HCT: 37.8 % (ref 36.0–46.0)
Hemoglobin: 11.5 g/dL — ABNORMAL LOW (ref 12.0–15.0)
MCH: 25.8 pg — ABNORMAL LOW (ref 26.0–34.0)
MCHC: 30.4 g/dL (ref 30.0–36.0)
MCV: 84.8 fL (ref 80.0–100.0)
Platelets: 191 10*3/uL (ref 150–400)
RBC: 4.46 MIL/uL (ref 3.87–5.11)
RDW: 18.1 % — ABNORMAL HIGH (ref 11.5–15.5)
WBC: 7.8 10*3/uL (ref 4.0–10.5)
nRBC: 0 % (ref 0.0–0.2)

## 2020-02-24 LAB — MAGNESIUM: Magnesium: 1.9 mg/dL (ref 1.7–2.4)

## 2020-02-24 NOTE — Progress Notes (Signed)
Progress Note  Patient Name: Michele Parrish Date of Encounter: 02/24/2020  Primary Cardiologist: Minus Breeding, MD   Subjective   She denies any acute complaints.  Breathing might be better but still desats with [place].   Inpatient Medications    Scheduled Meds: . budesonide (PULMICORT) nebulizer solution  0.5 mg Nebulization BID  . Chlorhexidine Gluconate Cloth  6 each Topical Daily  . docusate sodium  100 mg Oral BID  . enoxaparin (LOVENOX) injection  40 mg Subcutaneous Q24H  . feeding supplement (ENSURE ENLIVE)  237 mL Oral BID BM  . folic acid  1 mg Oral Daily  . furosemide  40 mg Oral BID  . guaiFENesin  600 mg Oral BID  . multivitamin with minerals  1 tablet Oral Daily  . nicotine  14 mg Transdermal Daily  . sodium chloride flush  3 mL Intravenous Q12H  . thiamine  100 mg Oral Daily   Continuous Infusions: . sodium chloride     PRN Meds: sodium chloride, acetaminophen **OR** acetaminophen, bisacodyl, ipratropium-albuterol, LORazepam, ondansetron **OR** ondansetron (ZOFRAN) IV, polyethylene glycol, sodium chloride flush   Vital Signs    Vitals:   02/23/20 1955 02/24/20 0100 02/24/20 0637 02/24/20 0823  BP: 125/82 118/79 (!) 110/56   Pulse: 95 91 93   Resp: 18 20 19    Temp: 98.3 F (36.8 C)  97.8 F (36.6 C)   TempSrc: Oral  Axillary   SpO2: 91%  97% 95%  Weight:      Height:        Intake/Output Summary (Last 24 hours) at 02/24/2020 1053 Last data filed at 02/24/2020 0545 Gross per 24 hour  Intake 200 ml  Output 4350 ml  Net -4150 ml   Last 3 Weights 02/23/2020 02/23/2020 02/22/2020  Weight (lbs) 140 lb 1.6 oz 152 lb 12.5 oz 161 lb 13.1 oz  Weight (kg) 63.549 kg 69.3 kg 73.4 kg      Telemetry    NSR, ST - Personally Reviewed  ECG    No new tracings - Personally Reviewed  Physical Exam   GEN: No  acute distress.   Neck:   Positive JVD Cardiac: RRR, no murmurs, rubs, or gallops.  Distant heart sounds Respiratory:    Decreased breath sounds  bilateral GI: Soft, nontender, non-distended, normal bowel sounds  MS:  No edema; No deformity. Neuro:   Nonfocal  Psych: Oriented and appropriate   Labs    High Sensitivity Troponin:   Recent Labs  Lab 02/21/20 1316 02/21/20 1605  TROPONINIHS 16 15      Chemistry Recent Labs  Lab 02/22/20 0549 02/22/20 0549 02/23/20 0536 02/23/20 0918 02/24/20 0321  NA 140   < > 141  138 142  142 143  K 4.0   < > 3.7  3.7 3.9  3.8 4.1  CL 110  --  102  102  --  99  CO2 25  --  32  29  --  31  GLUCOSE 92  --  91  87  --  89  BUN 17  --  16  16  --  18  CREATININE 1.08*  --  1.30*  1.31*  --  1.05*  CALCIUM 8.0*  --  8.0*  7.8*  --  8.6*  PROT 6.7  --  6.7  --  6.8  ALBUMIN 2.7*  --  2.7*  --  3.5  AST 25  --  21  --  17  ALT 20  --  18  --  13  ALKPHOS 90  --  85  --  70  BILITOT 0.9  --  1.1  --  1.0  GFRNONAA 59*  --  47*  47*  --  >60  GFRAA >60  --  55*  55*  --  >60  ANIONGAP 5  --  7  7  --  13   < > = values in this interval not displayed.     Hematology Recent Labs  Lab 02/22/20 0549 02/22/20 0549 02/23/20 0536 02/23/20 0918 02/24/20 0321  WBC 6.9  --  7.5  --  7.8  RBC 4.46  --  4.55  --  4.46  HGB 11.4*   < > 11.8* 13.9  13.9 11.5*  HCT 38.4   < > 39.2 41.0  41.0 37.8  MCV 86.1  --  86.2  --  84.8  MCH 25.6*  --  25.9*  --  25.8*  MCHC 29.7*  --  30.1  --  30.4  RDW 18.3*  --  18.6*  --  18.1*  PLT 184  --  200  --  191   < > = values in this interval not displayed.    BNP Recent Labs  Lab 02/21/20 0829 02/22/20 0549  BNP 986.4* 846.6*     DDimer No results for input(s): DDIMER in the last 168 hours.   Radiology    CARDIAC CATHETERIZATION  Result Date: 02/23/2020 Right heart catheterization was done via the right antecubital vein without complication. Moderately severely elevated right-sided filling pressures with moderate to severe pulmonary hypertension and normal cardiac output RA: 16 mmHg RV: 60 / 11 mmHg Pulmonary capillary  wedge pressure: 16 mmHg PA: 66/26 with a mean of 40 mmHg Ao sat 92% PA sat 63% Cardiac output is 4.92 with a cardiac index of 2.86. Pulmonary vascular resistance: 4.9 Wood units Recommendations: Pulmonary hypertension seems to be mixed venous and arterial but mostly arterial given that wedge pressure is only 16.  Avoid overdiuresis. I switched furosemide to oral. Continue evaluation of pulmonary hypertension and RV dysfunction.  MR CARDIAC MORPHOLOGY W WO CONTRAST  Result Date: 02/23/2020 CLINICAL DATA:  54 year old female with newly diagnosed right sided CHF and severe pulmonary hypertension. EXAM: CARDIAC MRI TECHNIQUE: The patient was scanned on a 1.5 Tesla GE magnet. A dedicated cardiac coil was used. Functional imaging was done using Fiesta sequences. 2,3, and 4 chamber views were done to assess for RWMA's. Modified Simpson's rule using a short axis stack was used to calculate an ejection fraction on a dedicated work [employer]. The patient received 8 cc of Gadavist. After 10 minutes inversion recovery sequences were used to assess for infiltration and scar tissue. CONTRAST:  8 cc  of Gadavist FINDINGS: 1. Small underfilling left ventricle with normal thickness and hyperdynamic systolic function (LVEF = 66%). There are no regional wall motion abnormalities. D-shaped interventricular septum in systole and diastole is consistent with RV pressure and volume overload. There is no late gadolinium enhancement in the left ventricular myocardium. LVEDD: 42 mm LVESD: 25 mm LVEDV: 74 ml LVESV: 25 ml SV: 49 ml CO: 5.1 L/min Myocardial mass: 98 g 2. Severely dilated right ventricular size, with moderate right ventricular hypertrophy and severely decreased systolic function (LVEF = 24%). There are no regional wall motion abnormalities. 3.  Normal left atrial size.  Moderately dilated right atrium. 4. Normal size of the aortic root, ascending aorta. Severely dilated pulmonary artery measuring 45 mm  consistent with pulmonary hypertension. 5.  Trivial mitral and moderate tricuspid regurgitation. 6. Normal pericardium. Mild to moderate circumferential pericardial effusion with maximum diameter 12 mm. Dilated IVC measuring 23 mm and no collapse. IMPRESSION: Study interpretation affected by patient's inability to hold her breath and motion during the exam. 1. Small underfilling left ventricle with normal thickness and hyperdynamic systolic function (LVEF = 66%). There are no regional wall motion abnormalities. D-shaped interventricular septum in systole and diastole is consistent with RV pressure and volume overload. There is no late gadolinium enhancement in the left ventricular myocardium. 2. Severely dilated right ventricular size, with moderate right ventricular hypertrophy and severely decreased systolic function (LVEF = 24%). There are no regional wall motion abnormalities. 3.  Normal left atrial size.  Moderately dilated right atrium. 4. Normal size of the aortic root, ascending aorta. Severely dilated pulmonary artery measuring 45 mm consistent with pulmonary hypertension. 5.  Trivial mitral and moderate tricuspid regurgitation. 6. Normal pericardium. Mild to moderate circumferential pericardial effusion with maximum diameter 12 mm. Dilated IVC measuring 23 mm and no collapse. These findings are consistent with isolated severe right ventricular failure leading to left sided underfilling and moderate circumferential pericardial effusion. Electronically Signed   By: Tobias Alexander   On: 02/23/2020 19:52   US Abdomen Limited RUQ  Result Date: 02/23/2020 CLINICAL DATA:  Hepatomegaly on physical exam EXAM: ULTRASOUND ABDOMEN LIMITED RIGHT UPPER QUADRANT COMPARISON:  None. FINDINGS: Gallbladder: No gallstones or wall thickening visualized. No sonographic Murphy sign noted by sonographer. Common bile duct: Diameter: 3.7 mm Liver: No focal lesion identified. Within normal limits in parenchymal echogenicity.  Portal vein is patent on color Doppler imaging with normal direction of blood flow towards the liver. Other: None. IMPRESSION: Unremarkable right upper quadrant ultrasound. Electronically Signed   By: Alcide Clever M.D.   On: 02/23/2020 23:09    Cardiac Studies   Right heart cath  Right heart catheterization was done via the right antecubital vein without complication.  Moderately severely elevated right-sided filling pressures with moderate to severe pulmonary hypertension and normal cardiac output  RA: 16 mmHg RV: 60 / 11 mmHg Pulmonary capillary wedge pressure: 16 mmHg PA: 66/26 with a mean of 40 mmHg Ao sat 92% PA sat 63% Cardiac output is 4.92 with a cardiac index of 2.86. Pulmonary vascular resistance: 4.9 Wood units  Recommendations: Pulmonary hypertension seems to be mixed venous and arterial but mostly arterial given that wedge pressure is only 16.  Avoid overdiuresis. I switched furosemide to oral. Continue evaluation of pulmonary hypertension and RV dysfunction.  Echo 02/21/20: 1. Left ventricular ejection fraction, by estimation, is 60 to 65%. The  left ventricle has normal function. The left ventricle has no regional  wall motion abnormalities. Left ventricular diastolic parameters are  consistent with Grade I diastolic  dysfunction (impaired relaxation).  2. There appear to be significant trabeculations in the RV apex. Given RV  dysfunction, recommend cardiac MRI to evaluate RV further. Right  ventricular systolic function is moderately reduced. The right ventricular  size is mildly enlarged. There is  severely elevated pulmonary artery systolic pressure. The estimated right  ventricular systolic pressure is 64.0 mmHg.  3. Right atrial size was severely dilated.  4. The mitral valve is normal in structure. No evidence of mitral valve  regurgitation. No evidence of mitral stenosis.  5. Tricuspid valve regurgitation is moderate.  6. The aortic valve is  normal in structure. Aortic valve regurgitation is  not visualized. No aortic stenosis is  present.  7. The inferior vena cava is dilated in size with <50% respiratory  variability, suggesting right atrial pressure of 15 mmHg.    Patient Profile     54 y.o. female with a hx of chronic bronchitis not on inhalers and COPD who is being seen for shortness of breath.  Assessment & Plan     Moderate right ventricular heart dysfunction Pulmonary HTN noted on echo and on MRI.  MRIl with severe RV dilatation.  There is a moderate pericardial effusion on MRI.  No infiltrative myocardial pattern.    Pulmonary HTN not likely explained by LV diastolic dysfunction.  Work up per pulmonary is ongoing.  No further cardiac work up.    AKI Creat is better today.   Net negative 17 cc since admission.  4 liters negative yesterday.   OK to continue diuresis with PO Lasix.  Follow creat.     Acute respiratory failure Chronic bronchitis, COPD See above.  Plan is for VQ scan.     For questions or updates, please contact [facility] Please consult [email or web address] for contact info under        Signed, Rollene Rotunda, MD  02/24/2020, 10:53 AM

## 2020-02-24 NOTE — Progress Notes (Signed)
NAME:  Michele Parrish, MRN:  [id number], DOB:  Sep 08, 1966, LOS: 3 ADMISSION DATE:  02/21/2020, CONSULTATION DATE:  02/23/20 REFERRING MD:  Percival Spanish - Cardiology, CHIEF COMPLAINT/Reason for consult:  CC:SOB, RFC: Pulmonary Hypertension  Brief History   54 yo F with chronic bronchitis, emphysematous lung changes admitted for SOB and hypoxia. Underwent RHC 4/30. PCCM consulted for pulmonary hypertension.  History of present illness   54 yo F PMH COPD with chronic bronchitis- non-compliant with OP meds, emphysematous pulmonary changes, who presented to ED with SOB x 3 weeks. SOB is progressive in quality, and has associated orthopnea as well as DOE. Patient also endorses associated edema of breasts, abdominal distention, and BLE. No URI sx or sick contacts. In ED, SpO2 80% on RA, SpO2 improved to 97% on 5LNC.   No previous history of diet pill use. Has done cocaine in the past- most recently about 6 months ago, but no amphetamines, adderall, ritalin, or other illicits. No previous history of malignancy or heart disease. No family history of heart disease, PH, or lung disease, including sarcoidosis. No history of VTE. Occasional snoring, but no witnessed apneas or known OSA. No history of sickle cell anemia. HIV negative in the past. Quit drinking 6 months ago. Quit smoking 1 week ago after 42 years x 1-2ppd.  BNP 986. ECHO with LVEF 60-65%, grade 1 DD. Severely elevated pulmonary artery systolic pressure and estimated RVSP 50mmHg.  Patient evaluated by cardiology and underwent [place] 4/30. PCCM Consulted 4/20 for [place].   Past Medical History  Chronic bronchitis - hasn't used nebulizer in 2 years prior to 3 weeks ago  [facility]   4/28 admitted 4/30 RHC  Consults:  Cards PCCM  Procedures:  4/30 RHC   Significant Diagnostic Tests:  4/28  CTA chest> no PE. Small R pleural effusion, bibasilar atelectasis. Multiple small bilateral pulmonary nodules <1cm, mostly in subpleural  location. Possible low-density enlarged mediastinal lymph nodes, bilaterally axillary adenopathy. Mild cardiomegaly. Small pericardial fluid collection.  4/29 ECHO: LVEF 60-65%. Grade I DD. Significant trabeculations in RV apex. Moderately reduced RV systolic function. Severely elevated pulmonary artery systolic pressure. RVSP 64 mmHg.  Moderate TR. RAP estimated 24mmHg   4/30 RHC: Moderately severely elevated right-sided filling pressures with moderate to severe pulmonary hypertension and normal cardiac output RA: 16 mmHg RV: 60 / 11 mmHg Pulmonary capillary wedge pressure: 16 mmHg PA: 66/26 with a mean of 40 mmHg Ao sat 92% PA sat 63% Cardiac output is 4.92 with a cardiac index of 2.86. Pulmonary vascular resistance: 4.9   RUQ [name] 4/30-no evidence of cirrhosis Cardiac MRI 4/30-Marked RVE with evidence of volume and pressure overload, no late gadolinium enhancement of the LV.  Severely reduced RV systolic function, no RWMA.  Normal LA, dilated RA. Micro Data:  SARS Cov2> neg  HIV negative 02/21/20  Antimicrobials:     Interim history/subjective:  Sleeping, not answering questions this morning.  Objective   Blood pressure (!) 110/56, pulse 93, temperature 97.8 F (36.6 C), temperature source Axillary, resp. rate 19, height 5\' 2"  (1.575 m), weight 63.5 kg, SpO2 95 %.        Intake/Output Summary (Last 24 hours) at 02/24/2020 1223 Last data filed at 02/24/2020 0545 Gross per 24 hour  Intake 200 ml  Output 3150 ml  Net -2950 ml   Filed Weights   02/22/20 0636 02/23/20 0610 02/23/20 1000  Weight: 73.4 kg 69.3 kg 63.5 kg    Examination: Exam limited due to patient  cooperation General: Chronically ill-appearing woman lying in bed HENT: Ong/AT Lungs: Posteriorly distant breath sounds.  No wheezing.  Breathing comfortably on 7 L nasal cannula. Cardiovascular: Limited assessment, skin well perfused. Abdomen: Not able to be examined Extremities: Not able to examined Neuro: Answering  questions appropriately, not moving much Derm: no significant bruising or bleeding  Resolved Hospital Problem list     Assessment & Plan:   Acute respiratory failure with hypoxia COPD- emphysematous changes on CT, non-compliant with outpatient medications (previously prescribed Breo) Pulmonary hypertension- possibly WHO group III due to lung disease, but this seems unlikely to fully explain her presentation without long-standing hypoxia, which is usually associated with more significant chronic pulmonary symptoms. Sarcoidosis is possible with pulmonary nodules & adenopathy.  No evidence of cirrhosis on right upper quadrant ultrasound.  Hepatomegaly seen on CT and physical exam is likely congestive. -Supplemental oxygen to maintain SPO2 greater than 90% -Diuresis per cardiology -Needs VQ scan to evaluate for CTEPH (ordered) -Needs outpatient PSG to evaluate for OSA and PFTs.  Given the degree of emphysema on her CT scan, it is not unreasonable to start her on Spiriva or Incruse daily at discharge. -Depending on the results of the above studies, may be a candidate for ERA + PDEi, although her BP is unlikely to tolerate initiating these concurrently.  Multiple sub-centimeter pulmonary nodules Axillary lymphadenopathy, possible mediastinal lymphadenopathy- unclear if this is due to vascular congestion. Sarcoidosis is possible, which could explain PH. -OP follow up for follow up CT in 3-6 months for multiple nodules -May need an EBUS to evaluate for sarcoidosis; needs to be optimized from [facility] standpoint before this is attempted.  Tobacco use disorder -agree with nicotine patch -smoking cessation counseling previously  Alcohol use disorder - reports was remote -monitor for signs of withdrawal -supplemental vitamins  BLE edema likely 2/2 acute heart failure -BLE dopplers pending to r/o DVT   We will follow up after VQ and dopplers are completed.  Best practice:  Diet:  reg Pain/Anxiety/Delirium protocol (if indicated): na VAP protocol (if indicated): na DVT prophylaxis: lovenox  GI prophylaxis: na Glucose control: na Mobility: post-RHC  Code Status: Full  Family Communication: primary team  Disposition: cardiac tele   Labs   CBC: Recent Labs  Lab 02/21/20 0829 02/21/20 0829 02/21/20 0839 02/22/20 0549 02/23/20 0536 02/23/20 0918 02/24/20 0321  WBC 7.7  --   --  6.9 7.5  --  7.8  NEUTROABS 3.6  --   --   --  3.5  --   --   HGB 12.4   < > 15.0  15.0 11.4* 11.8* 13.9  13.9 11.5*  HCT 41.7   < > 44.0  44.0 38.4 39.2 41.0  41.0 37.8  MCV 86.5  --   --  86.1 86.2  --  84.8  PLT 209  --   --  184 200  --  191   < > = values in this interval not displayed.    Basic Metabolic Panel: Recent Labs  Lab 02/21/20 0829 02/21/20 0829 02/21/20 0839 02/21/20 0839 02/21/20 1316 02/22/20 0549 02/23/20 0536 02/23/20 0918 02/24/20 0321  NA 142   < > 142  142  --   --  140 141  138 142  142 143  K 4.3   < > 6.8*  6.8*   < > 3.8 4.0 3.7  3.7 3.9  3.8 4.1  CL 115*  --  110  110  --   --  110 102  102  --  99  CO2 22  --   --   --   --  25 32  29  --  31  GLUCOSE 78  --  72  72  --   --  92 91  87  --  89  BUN 19  --  27*  27*  --   --  17 16  16   --  18  CREATININE 1.35*  --  1.30*  1.30*  --   --  1.08* 1.30*  1.31*  --  1.05*  CALCIUM 8.3*  --   --   --   --  8.0* 8.0*  7.8*  --  8.6*  MG 1.8  --   --   --   --  1.6* 1.9  --  1.9  PHOS  --   --   --   --   --  4.2  --   --   --    < > = values in this interval not displayed.   GFR: Estimated Creatinine Clearance: 55.5 mL/min (A) (by C-G formula based on SCr of 1.05 mg/dL (H)). Recent Labs  Lab 02/21/20 0829 02/22/20 0549 02/23/20 0536 02/24/20 0321  WBC 7.7 6.9 7.5 7.8    Liver Function Tests: Recent Labs  Lab 02/21/20 0829 02/22/20 0549 02/23/20 0536 02/24/20 0321  AST 33 25 21 17   ALT 21 20 18 13   ALKPHOS 99 90 85 70  BILITOT 0.7 0.9 1.1 1.0  PROT 7.3 6.7  6.7 6.8  ALBUMIN 2.9* 2.7* 2.7* 3.5   No results for input(s): LIPASE, AMYLASE in the last 168 hours. No results for input(s): AMMONIA in the last 168 hours.  ABG    Component Value Date/Time   PHART 7.424 (H) 10/04/2010 0930   PCO2ART 41.6 10/04/2010 0930   PO2ART 59.9 (L) 10/04/2010 0930   HCO3 35.3 (H) 02/23/2020 0918   HCO3 34.8 (H) 02/23/2020 0918   TCO2 37 (H) 02/23/2020 0918   TCO2 37 (H) 02/23/2020 0918   ACIDBASEDEF 2.3 (H) 09/12/2010 0501   O2SAT 62.0 02/23/2020 0918   O2SAT 63.0 02/23/2020 0918     Coagulation Profile: Recent Labs  Lab 02/21/20 0829 02/22/20 0549  INR 1.2 1.2    Cardiac Enzymes: No results for input(s): CKTOTAL, CKMB, CKMBINDEX, TROPONINI in the last 168 hours.  HbA1C: Hgb A1c MFr Bld  Date/Time Value Ref Range Status  02/21/2020 01:16 PM 6.4 (H) 4.8 - 5.6 % Final    Comment:    (NOTE) Pre diabetes:          5.7%-6.4% Diabetes:              >6.4% Glycemic control for   <7.0% adults with diabetes     CBG: No results for input(s): GLUCAP in the last 168 hours.  02/23/20, DO 02/24/20 12:36 PM [facility]

## 2020-02-24 NOTE — Progress Notes (Signed)
PT Cancellation Note  Patient Details Name: Michele Parrish MRN: [id number] DOB: 21-Jan-1966  .   Cancelled Treatment:    Reason Eval/Treat Not Completed: Medical issues which prohibited therapy.  Pending dopplers for susp DVT's due to LE edema, and will retry at another time.   Ivar Drape 02/24/2020, 9:44 AM   Samul Dada, PT MS Acute Rehab Dept. Number: [facility] [id number] and [facility] [phone]

## 2020-02-24 NOTE — Progress Notes (Signed)
PROGRESS NOTE    Michele EarlsLinette E Shahin  FAO:[id number] DOB: November 22, 1965 DOA: 02/21/2020 PCP: Loletta SpecterGomez, Roger David, PA-C    Chief Complaint  Patient presents with  . Shortness of Breath    Brief Narrative:  Admission note by Dr Tyson BabinskiPokhrel " Michele Parrish is a 54 y.o. female with past medical history of chronic bronchitis supposed to be on nebulizers but not on it, presented to hospital with complaints of worsening shortness of breath for the last 3 weeks.  Patient stated that has been getting worse recently and was unable to sleep due to dyspnea suggestive of orthopnea. She takes few steps and is very short winded.  She has been noticing swelling of her bilateral breasts mostly on the left and her abdomen girth has increased as well.  She also notices increasing leg swelling bilaterally.  Patient currently smokes. Patient denied any chest pain, sputum production, phlegm production.  No fever, chills or rigor.  No urinary urgency, frequency or dysuria.  Denies any changes in her bowel habits.  Denies sick contacts or recent travel."  ED Course: Patient was noted to be hypoxic saturating 80% on room air by EMS.  In the ED, patient complained of dyspnea and was put on supplemental oxygen at 5 L with saturation around 97%. Patient was then considered for admission to the hospital.   4/30: Diuresed.  Underwent cardiac cath that showed severe pulmonary hypertension.   Assessment & Plan:   Principal Problem:   Acute hypoxemic respiratory failure (HCC) Active Problems:   COPD with chronic bronchitis (HCC)   Acute exacerbation of CHF (congestive heart failure) (HCC)   Hypomagnesemia   AKI (acute kidney injury) (HCC)   Anasarca   Tobacco abuse   Moderate to severe pulmonary hypertension (HCC)   Pulmonary nodules   Debility   Acute diastolic CHF (congestive heart failure) (HCC)  1. acute hypoxic respiratory failure secondary to acute diastolic heart failure/right heart failure in the setting of  severe pulmonary hypertension: Multifactorial. COPD, pulmonary nodules suspect chronic lung disease, rule out sarcoidosis Severe pulmonary hypertension, pulmonology consulted and they are working up.  VQ scan today. Gentle diuresis to continue. Patient has 17 L negative balance since admission.  Tolerating diuresis. Continue on oxygen to keep saturation more than 90%.  We will continue to wean down.  May need home oxygen on discharge. On bronchodilator therapy. Rule out DVT.  2.  Acute kidney injury Likely secondary to problem #1.  Renal function improving with diuresis.  Recheck in the morning.  3.  Hypomagnesemia Replaced.  We will follow up.  4.  Multiple pulmonary nodules Questionable etiology.  Will likely need outpatient follow-up with pulmonary with repeat CT scan in about 3 months.  Followed by pulmonology.  May need bronc and biopsy.  5.  Anasarca/hypoalbuminemia: Albumin 2.7. Today albumin improved after albumin infusion.  Not a true reflection.  TSH is acceptable.   We will check urine for total protein to rule out proteinuria nephrotic syndrome.  6.  Tobacco abuse Tobacco cessation.  Continue nicotine patch.  Patient is deteremind to quit.  7.  Debility/weakness PT/OT.     DVT prophylaxis: Lovenox Code Status: Full Family Communication: Updated patient.  No family at bedside. Disposition:   Status is: Inpatient    Dispo: The patient is from: Home              Anticipated d/c is to: Likely home.  Anticipated d/c date is: To be determined.         Currently on active treatment.  On oxygen.        Consultants:   Cardiology: Dr. Percival Spanish 02/22/2020  Pulmonology  Procedures:   2D echo 02/21/2020  Chest x-ray 02/21/2020  CT angiogram chest 02/21/2020  Right heart cath  02/23/2020  MRI of the heart, right ventricular dilatation.  Antimicrobials:   None   Subjective:  Patient seen and examined.  No overnight events.  Does not want  to be on oxygen cannula.  Swelling somehow better.  Has not ambulated yet.  Objective: Vitals:   02/23/20 1955 02/24/20 0100 02/24/20 0637 02/24/20 0823  BP: 125/82 118/79 (!) 110/56   Pulse: 95 91 93   Resp: 18 20 19    Temp: 98.3 F (36.8 C)  97.8 F (36.6 C)   TempSrc: Oral  Axillary   SpO2: 91%  97% 95%  Weight:      Height:        Intake/Output Summary (Last 24 hours) at 02/24/2020 1123 Last data filed at 02/24/2020 0545 Gross per 24 hour  Intake 200 ml  Output 3150 ml  Net -2950 ml   Filed Weights   02/22/20 0636 02/23/20 0610 02/23/20 1000  Weight: 73.4 kg 69.3 kg 63.5 kg    Examination:  Physical Exam  Constitutional: She is oriented to person, place, and time.  Chronically sick looking.  Remains comfortable.  Currently on 4 L nasal cannula oxygen. 1+ pedal edema bilateral leg. Skin edema along the left breast and abdomen.  HENT:  Head: Normocephalic.  Eyes: Pupils are equal, round, and reactive to light.  Cardiovascular: Normal rate and regular rhythm.  Pulmonary/Chest: Breath sounds normal.  No added sounds.  Musculoskeletal:     Cervical back: Neck supple.  Neurological: She is oriented to person, place, and time.      Data Reviewed: I have personally reviewed following labs and imaging studies  CBC: Recent Labs  Lab 02/21/20 0829 02/21/20 0829 02/21/20 0839 02/22/20 0549 02/23/20 0536 02/23/20 0918 02/24/20 0321  WBC 7.7  --   --  6.9 7.5  --  7.8  NEUTROABS 3.6  --   --   --  3.5  --   --   HGB 12.4   < > 15.0  15.0 11.4* 11.8* 13.9  13.9 11.5*  HCT 41.7   < > 44.0  44.0 38.4 39.2 41.0  41.0 37.8  MCV 86.5  --   --  86.1 86.2  --  84.8  PLT 209  --   --  184 200  --  191   < > = values in this interval not displayed.    Basic Metabolic Panel: Recent Labs  Lab 02/21/20 0829 02/21/20 0829 02/21/20 0839 02/21/20 0839 02/21/20 1316 02/22/20 0549 02/23/20 0536 02/23/20 0918 02/24/20 0321  NA 142   < > 142  142  --   --  140 141   138 142  142 143  K 4.3   < > 6.8*  6.8*   < > 3.8 4.0 3.7  3.7 3.9  3.8 4.1  CL 115*  --  110  110  --   --  110 102  102  --  99  CO2 22  --   --   --   --  25 32  29  --  31  GLUCOSE 78  --  72  72  --   --  92 91  87  --  89  BUN 19  --  27*  27*  --   --  17 16  16   --  18  CREATININE 1.35*  --  1.30*  1.30*  --   --  1.08* 1.30*  1.31*  --  1.05*  CALCIUM 8.3*  --   --   --   --  8.0* 8.0*  7.8*  --  8.6*  MG 1.8  --   --   --   --  1.6* 1.9  --  1.9  PHOS  --   --   --   --   --  4.2  --   --   --    < > = values in this interval not displayed.    GFR: Estimated Creatinine Clearance: 55.5 mL/min (A) (by C-G formula based on SCr of 1.05 mg/dL (H)).  Liver Function Tests: Recent Labs  Lab 02/21/20 0829 02/22/20 0549 02/23/20 0536 02/24/20 0321  AST 33 25 21 17   ALT 21 20 18 13   ALKPHOS 99 90 85 70  BILITOT 0.7 0.9 1.1 1.0  PROT 7.3 6.7 6.7 6.8  ALBUMIN 2.9* 2.7* 2.7* 3.5    CBG: No results for input(s): GLUCAP in the last 168 hours.   Recent Results (from the past 240 hour(s))  Respiratory Panel by RT PCR (Flu A&B, Covid) - Nasopharyngeal Swab     Status: None   Collection Time: 02/21/20 10:18 AM   Specimen: Nasopharyngeal Swab  Result Value Ref Range Status   SARS Coronavirus 2 by RT PCR NEGATIVE NEGATIVE Final    Comment: (NOTE) SARS-CoV-2 target nucleic acids are NOT DETECTED. The SARS-CoV-2 RNA is generally detectable in upper respiratoy specimens during the acute phase of infection. The lowest concentration of SARS-CoV-2 viral copies this assay can detect is 131 copies/mL. A negative result does not preclude SARS-Cov-2 infection and should not be used as the sole basis for treatment or other patient management decisions. A negative result may occur with  improper specimen collection/handling, submission of specimen other than nasopharyngeal swab, presence of viral mutation(s) within the areas targeted by this assay, and inadequate number of  viral copies (<131 copies/mL). A negative result must be combined with clinical observations, patient history, and epidemiological information. The expected result is Negative. Fact Sheet for Patients:  Fact Sheet for Healthcare Providers:  This test is not yet ap proved or cleared by the 02/23/20 FDA and  has been authorized for detection and/or diagnosis of SARS-CoV-2 by FDA under an Emergency Use Authorization (EUA). This EUA will remain  in effect (meaning this test can be used) for the duration of the COVID-19 declaration under Section 564(b)(1) of the Act, 21 U.S.C. section 360bbb-3(b)(1), unless the authorization is terminated or revoked sooner.    Influenza A by PCR NEGATIVE NEGATIVE Final   Influenza B by PCR NEGATIVE NEGATIVE Final    Comment: (NOTE) The Xpert Xpress SARS-CoV-2/FLU/RSV assay is intended as an aid in  the diagnosis of influenza from Nasopharyngeal swab specimens and  should not be used as a sole basis for treatment. Nasal washings and  aspirates are unacceptable for Xpert Xpress SARS-CoV-2/FLU/RSV  testing. Fact Sheet for Patients: https://www.moore.com/ Fact Sheet for Healthcare Providers: https://www.young.biz/ This test is not yet approved or cleared by the [place] FDA and  has been authorized for detection and/or diagnosis of SARS-CoV-2 by  FDA under an Emergency Use Authorization (EUA). This EUA will  remain  in effect (meaning this test can be used) for the duration of the  Covid-19 declaration under Section 564(b)(1) of the Act, 21  U.S.C. section 360bbb-3(b)(1), unless the authorization is  terminated or revoked. Performed at [facility], [address] [place], [place] [address]   Urine culture     Status: None   Collection Time: 02/21/20  3:36 PM   Specimen: Urine, Random  Result Value Ref  Range Status   Specimen Description   Final    URINE, RANDOM Performed at [facility], [address] [place], [place] [address]    Special Requests   Final    NONE Performed at [facility], [address], [place], [place] [address]    Culture   Final    NO GROWTH Performed at [facility], [address] [place], [place] [address]    Report Status 02/22/2020 FINAL  Final         Radiology Studies: CARDIAC CATHETERIZATION  Result Date: 02/23/2020 Right heart catheterization was done via the right antecubital vein without complication. Moderately severely elevated right-sided filling pressures with moderate to severe pulmonary hypertension and normal cardiac output RA: 16 mmHg RV: 60 / 11 mmHg Pulmonary capillary wedge pressure: 16 mmHg PA: 66/26 with a mean of 40 mmHg Ao sat 92% PA sat 63% Cardiac output is 4.92 with a cardiac index of 2.86. Pulmonary vascular resistance: 4.9 Wood units Recommendations: Pulmonary hypertension seems to be mixed venous and arterial but mostly arterial given that wedge pressure is only 16.  Avoid overdiuresis. I switched furosemide to oral. Continue evaluation of pulmonary hypertension and RV dysfunction.  MR CARDIAC MORPHOLOGY W WO CONTRAST  Result Date: 02/23/2020 CLINICAL DATA:  54 year old female with newly diagnosed right sided CHF and severe pulmonary hypertension. EXAM: CARDIAC MRI TECHNIQUE: The patient was scanned on a 1.5 Tesla GE magnet. A dedicated cardiac coil was used. Functional imaging was done using Fiesta sequences. 2,3, and 4 chamber views were done to assess for RWMA's. Modified Simpson's rule using a short axis stack was used to calculate an ejection fraction on a dedicated work [employer]. The patient received 8 cc of Gadavist. After 10 minutes inversion recovery sequences were used to assess for infiltration and scar tissue. CONTRAST:  8 cc  of Gadavist FINDINGS: 1.  Small underfilling left ventricle with normal thickness and hyperdynamic systolic function (LVEF = 66%). There are no regional wall motion abnormalities. D-shaped interventricular septum in systole and diastole is consistent with RV pressure and volume overload. There is no late gadolinium enhancement in the left ventricular myocardium. LVEDD: 42 mm LVESD: 25 mm LVEDV: 74 ml LVESV: 25 ml SV: 49 ml CO: 5.1 L/min Myocardial mass: 98 g 2. Severely dilated right ventricular size, with moderate right ventricular hypertrophy and severely decreased systolic function (LVEF = 24%). There are no regional wall motion abnormalities. 3.  Normal left atrial size.  Moderately dilated right atrium. 4. Normal size of the aortic root, ascending aorta. Severely dilated pulmonary artery measuring 45 mm consistent with pulmonary hypertension. 5.  Trivial mitral and moderate tricuspid regurgitation. 6. Normal pericardium. Mild to moderate circumferential pericardial effusion with maximum diameter 12 mm. Dilated IVC measuring 23 mm and no collapse. IMPRESSION: Study interpretation affected by patient's inability to hold her breath and motion during the exam. 1. Small underfilling left ventricle with normal thickness and hyperdynamic systolic function (LVEF = 66%). There are no regional wall motion  abnormalities. D-shaped interventricular septum in systole and diastole is consistent with RV pressure and volume overload. There is no late gadolinium enhancement in the left ventricular myocardium. 2. Severely dilated right ventricular size, with moderate right ventricular hypertrophy and severely decreased systolic function (LVEF = 24%). There are no regional wall motion abnormalities. 3.  Normal left atrial size.  Moderately dilated right atrium. 4. Normal size of the aortic root, ascending aorta. Severely dilated pulmonary artery measuring 45 mm consistent with pulmonary hypertension. 5.  Trivial mitral and moderate tricuspid regurgitation.  6. Normal pericardium. Mild to moderate circumferential pericardial effusion with maximum diameter 12 mm. Dilated IVC measuring 23 mm and no collapse. These findings are consistent with isolated severe right ventricular failure leading to left sided underfilling and moderate circumferential pericardial effusion. Electronically Signed   By: Tobias Alexander   On: 02/23/2020 19:52   US Abdomen Limited RUQ  Result Date: 02/23/2020 CLINICAL DATA:  Hepatomegaly on physical exam EXAM: ULTRASOUND ABDOMEN LIMITED RIGHT UPPER QUADRANT COMPARISON:  None. FINDINGS: Gallbladder: No gallstones or wall thickening visualized. No sonographic Murphy sign noted by sonographer. Common bile duct: Diameter: 3.7 mm Liver: No focal lesion identified. Within normal limits in parenchymal echogenicity. Portal vein is patent on color Doppler imaging with normal direction of blood flow towards the liver. Other: None. IMPRESSION: Unremarkable right upper quadrant ultrasound. Electronically Signed   By: Alcide Clever M.D.   On: 02/23/2020 23:09        Scheduled Meds: . budesonide (PULMICORT) nebulizer solution  0.5 mg Nebulization BID  . Chlorhexidine Gluconate Cloth  6 each Topical Daily  . docusate sodium  100 mg Oral BID  . enoxaparin (LOVENOX) injection  40 mg Subcutaneous Q24H  . feeding supplement (ENSURE ENLIVE)  237 mL Oral BID BM  . folic acid  1 mg Oral Daily  . furosemide  40 mg Oral BID  . guaiFENesin  600 mg Oral BID  . multivitamin with minerals  1 tablet Oral Daily  . nicotine  14 mg Transdermal Daily  . sodium chloride flush  3 mL Intravenous Q12H  . thiamine  100 mg Oral Daily   Continuous Infusions: . sodium chloride       LOS: 3 days    Time spent: 30 minutes    Dorcas Carrow, MD [facility]

## 2020-02-24 NOTE — Progress Notes (Signed)
PT Cancellation Note  Patient Details Name: Michele Parrish MRN: [id number] DOB: 1966/06/10   Cancelled Treatment:    Reason Eval/Treat Not Completed: Medical issues which prohibited therapy.  Awaiting both VQ scan and dopplers for LE edema.  Hold therapy and will retry when pt is medically able to work.   Ivar Drape 02/24/2020, 12:20 PM   Samul Dada, PT MS Acute Rehab Dept. Number: [facility] [id number] and [facility] [phone]
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
## 2020-02-25 ENCOUNTER — Inpatient Hospital Stay (HOSPITAL_COMMUNITY): Payer: Medicaid Other

## 2020-02-25 DIAGNOSIS — R0602 Shortness of breath: Secondary | ICD-10-CM

## 2020-02-25 DIAGNOSIS — R590 Localized enlarged lymph nodes: Secondary | ICD-10-CM

## 2020-02-25 DIAGNOSIS — Z720 Tobacco use: Secondary | ICD-10-CM

## 2020-02-25 LAB — BASIC METABOLIC PANEL
Anion gap: 8 (ref 5–15)
BUN: 26 mg/dL — ABNORMAL HIGH (ref 6–20)
CO2: 35 mmol/L — ABNORMAL HIGH (ref 22–32)
Calcium: 9.1 mg/dL (ref 8.9–10.3)
Chloride: 98 mmol/L (ref 98–111)
Creatinine, Ser: 1.24 mg/dL — ABNORMAL HIGH (ref 0.44–1.00)
GFR calc Af Amer: 58 mL/min — ABNORMAL LOW (ref >60)
GFR calc non Af Amer: 50 mL/min — ABNORMAL LOW (ref >60)
Glucose, Bld: 91 mg/dL (ref 70–99)
Potassium: 4.2 mmol/L (ref 3.5–5.1)
Sodium: 141 mmol/L (ref 135–145)

## 2020-02-25 LAB — CBC WITH DIFFERENTIAL/PLATELET
Abs Immature Granulocytes: 0.02 10*3/uL (ref 0.00–0.07)
Basophils Absolute: 0.1 10*3/uL (ref 0.0–0.1)
Basophils Relative: 1 %
Eosinophils Absolute: 0.1 10*3/uL (ref 0.0–0.5)
Eosinophils Relative: 2 %
HCT: 37.9 % (ref 36.0–46.0)
Hemoglobin: 11.4 g/dL — ABNORMAL LOW (ref 12.0–15.0)
Immature Granulocytes: 0 %
Lymphocytes Relative: 39 %
Lymphs Abs: 3.3 10*3/uL (ref 0.7–4.0)
MCH: 25.2 pg — ABNORMAL LOW (ref 26.0–34.0)
MCHC: 30.1 g/dL (ref 30.0–36.0)
MCV: 83.8 fL (ref 80.0–100.0)
Monocytes Absolute: 0.6 10*3/uL (ref 0.1–1.0)
Monocytes Relative: 7 %
Neutro Abs: 4.4 10*3/uL (ref 1.7–7.7)
Neutrophils Relative %: 51 %
Platelets: 203 10*3/uL (ref 150–400)
RBC: 4.52 MIL/uL (ref 3.87–5.11)
RDW: 18.2 % — ABNORMAL HIGH (ref 11.5–15.5)
WBC: 8.6 10*3/uL (ref 4.0–10.5)
nRBC: 0 % (ref 0.0–0.2)

## 2020-02-25 LAB — URINALYSIS, ROUTINE W REFLEX MICROSCOPIC
Bilirubin Urine: NEGATIVE
Glucose, UA: NEGATIVE mg/dL
Hgb urine dipstick: NEGATIVE
Ketones, ur: NEGATIVE mg/dL
Leukocytes,Ua: NEGATIVE
Nitrite: NEGATIVE
Protein, ur: NEGATIVE mg/dL
Specific Gravity, Urine: 1.005 (ref 1.005–1.030)
pH: 9 — ABNORMAL HIGH (ref 5.0–8.0)

## 2020-02-25 LAB — PHOSPHORUS: Phosphorus: 4.1 mg/dL (ref 2.5–4.6)

## 2020-02-25 LAB — MAGNESIUM: Magnesium: 1.8 mg/dL (ref 1.7–2.4)

## 2020-02-25 NOTE — Progress Notes (Signed)
PT Cancellation Note  Patient Details Name: KINZE LABO MRN: [id number] DOB: 1966/02/22   Cancelled Treatment:    Reason Eval/Treat Not Completed: Medical issues which prohibited therapy Pt still awaiting VQ scan and dopplers to rule out DVT/PE. Will follow up as pt medically appropriate and as schedule allows.   Farley Ly, PT, DPT  Acute Rehabilitation Services  Pager: [phone] Office: [phone]    Lehman Prom 02/25/2020, 9:01 AM

## 2020-02-25 NOTE — Progress Notes (Signed)
NAME:  Michele Parrish, MRN:  [id number], DOB:  07-Aug-1966, LOS: 4 ADMISSION DATE:  02/21/2020, CONSULTATION DATE:  02/23/20 REFERRING MD:  Antoine Poche - Cardiology, CHIEF COMPLAINT/Reason for consult:  CC:SOB, RFC: Pulmonary Hypertension  Brief History   54 yo F with chronic bronchitis, emphysematous lung changes admitted for SOB and hypoxia. Underwent RHC 4/30. PCCM consulted for pulmonary hypertension.  History of present illness   54 yo F PMH COPD with chronic bronchitis- non-compliant with OP meds, emphysematous pulmonary changes, who presented to ED with SOB x 3 weeks. SOB is progressive in quality, and has associated orthopnea as well as DOE. Patient also endorses associated edema of breasts, abdominal distention, and BLE. No URI sx or sick contacts. In ED, SpO2 80% on RA, SpO2 improved to 97% on 5LNC.   No previous history of diet pill use. Has done cocaine in the past- most recently about 6 months ago, but no amphetamines, adderall, ritalin, or other illicits. No previous history of malignancy or heart disease. No family history of heart disease, PH, or lung disease, including sarcoidosis. No history of VTE. Occasional snoring, but no witnessed apneas or known OSA. No history of sickle cell anemia. HIV negative in the past. Quit drinking 6 months ago. Quit smoking 1 week ago after 42 years x 1-2ppd.  BNP 986. ECHO with LVEF 60-65%, grade 1 DD. Severely elevated pulmonary artery systolic pressure and estimated RVSP .  Patient evaluated by cardiology and underwent RHC 4/30. PCCM Consulted 4/20 for PH.   Past Medical History  Chronic bronchitis - hasn't used nebulizer in 2 years prior to 3 weeks ago  Significant Hospital Events   4/28 admitted 4/30 RHC  Consults:  Cards PCCM  Procedures:  4/30 RHC   Significant Diagnostic Tests:  4/28  CTA chest> no PE. Small R pleural effusion, bibasilar atelectasis. Multiple small bilateral pulmonary nodules <1cm, mostly in subpleural  location. Possible low-density enlarged mediastinal lymph nodes, bilaterally axillary adenopathy. Mild cardiomegaly. Small pericardial fluid collection.  4/29 ECHO: LVEF 60-65%. Grade I DD. Significant trabeculations in RV apex. Moderately reduced RV systolic function. Severely elevated pulmonary artery systolic pressure. RVSP 64 mmHg.  Moderate TR. RAP estimated   4/30 RHC: Moderately severely elevated right-sided filling pressures with moderate to severe pulmonary hypertension and normal cardiac output RA: 16 mmHg RV: 60 / 11 mmHg Pulmonary capillary wedge pressure: 16 mmHg PA: 66/26 with a mean of 40 mmHg Ao sat 92% PA sat 63% Cardiac output is 4.92 with a cardiac index of 2.86. Pulmonary vascular resistance: 4.9   RUQ [name] 4/30-no evidence of cirrhosis Cardiac MRI 4/30-Marked RVE with evidence of volume and pressure overload, no late gadolinium enhancement of the LV.  Severely reduced RV systolic function, no RWMA.  Normal LA, dilated RA. Micro Data:  SARS Cov2> neg  HIV negative 02/21/20  Antimicrobials:     Interim history/subjective:  She denies complaints, including shortness of breath, cough, wheezing.  Dr. Antoine Poche alerted me that her breathing was less stable this morning.  Objective   Blood pressure 110/78, pulse 95, temperature 98.5 F (36.9 C), temperature source Oral, resp. rate (!) 24, height 5\' 2"  (1.575 m), weight 65 kg, SpO2 94 %.        Intake/Output Summary (Last 24 hours) at 02/25/2020 1602 Last data filed at 02/25/2020 1415 Gross per 24 hour  Intake 900 ml  Output 04/26/2020 ml  Net -12100 ml   Filed Weights   02/23/20 0610 02/23/20 1000 02/25/20 0456  Weight: 69.3 kg 63.5 kg 65 kg    Examination:   General: alert, laying horizontally in bed eating her lunch HENT: [place]/AT, eyes anicteric.  Lungs: No wheezing or rhales. Breathing comfortably on 7L [place] saturating 93%, no increased WOB or tachypnea. Cardiovascular: RRR, well-perfused skin.  Dilated chest  wall and forehead/ temple veins. Abdomen: soft, ND, hepatomegaly Extremities: no LE edema. No cyanosis. + clubbing. Neuro: awake and alert, watching TV in bed, answering questions appropriately. Derm: no significant bleeding or bruising, no rashes.  CXR personally reviewed-cardiomegaly, ongoing congestive appearance of the lungs.  Right lower lobe nodularity.  Resolved Hospital Problem list     Assessment & Plan:   Acute respiratory failure with hypoxia COPD- emphysematous changes on CT, not using prescribed outpatient medications (previously prescribed Breo) Pulmonary hypertension- possibly WHO group III due to lung disease, but this seems unlikely to fully explain her presentation without long-standing hypoxia, which is usually associated with more significant chronic pulmonary symptoms. Sarcoidosis is possible with pulmonary nodules & adenopathy.  No evidence of cirrhosis on right upper quadrant ultrasound; hepatomegaly seen on CT and physical exam is likely congestive. PH out of proportion to LV pressures.  -Supplemental oxygen to maintain SPO2 greater than 90%. Recommend good pulmonary hygiene, OOB mobility, IS. -Recommend that she be up in a chair for meals to minimize risk of aspiration. -Diuresis per cardiology's recommendations -Needs VQ scan to evaluate for CTEPH (ordered) -Needs outpatient PFTs and PSG to evaluate for OSA.  Given the degree of emphysema on her CT scan, it is not unreasonable to start her on Spiriva or Incruse daily at discharge. -Depending on the results of the above studies, may be a candidate for ERA + PDEi, although her BP is unlikely to tolerate initiating these concurrently.  Multiple sub-centimeter pulmonary nodules Axillary lymphadenopathy, mediastinal lymphadenopathy, lymph nodes in the inguinal region- unclear if this is due to vascular congestion. Sarcoidosis is possible, which could explain PH. -OP follow up for follow up CT in 3-6 months for multiple  nodules -May need an EBUS vs inguinal node biopsy to evaluate for sarcoidosis; needs to be optimized from [facility] standpoint before this is attempted.  Tobacco use disorder -Continue nicotine patch -smoking cessation counseling previously -Congratulated her on her success  Alcohol use disorder - reports was remote -monitor for signs of withdrawal -supplemental vitamins  BLE edema likely 2/2 acute heart failure. No DVT on [name]. -volume management per cardiology   We will follow up after VQ is completed.  Best practice:  Diet: reg Pain/Anxiety/Delirium protocol (if indicated): na VAP protocol (if indicated): na DVT prophylaxis: lovenox  GI prophylaxis: na Glucose control: na Mobility: post-RHC  Code Status: Full  Family Communication: primary team  Disposition: cardiac tele   Labs   CBC: Recent Labs  Lab 02/21/20 0829 02/21/20 0839 02/22/20 0549 02/23/20 0536 02/23/20 0918 02/24/20 0321 02/25/20 0448  WBC 7.7  --  6.9 7.5  --  7.8 8.6  NEUTROABS 3.6  --   --  3.5  --   --  4.4  HGB 12.4   < > 11.4* 11.8* 13.9  13.9 11.5* 11.4*  HCT 41.7   < > 38.4 39.2 41.0  41.0 37.8 37.9  MCV 86.5  --  86.1 86.2  --  84.8 83.8  PLT 209  --  184 200  --  191 203   < > = values in this interval not displayed.    Basic Metabolic Panel: Recent Labs  Lab 02/21/20 [phone]  02/21/20 5400 02/21/20 8676 02/21/20 1316 02/22/20 0549 02/23/20 0536 02/23/20 0918 02/24/20 0321 02/25/20 0448  NA 142   < > 142  142  --  140 141  138 142  142 143 141  K 4.3   < > 6.8*  6.8*   < > 4.0 3.7  3.7 3.9  3.8 4.1 4.2  CL 115*   < > 110  110  --  110 102  102  --  99 98  CO2 22  --   --   --  25 32  29  --  31 35*  GLUCOSE 78   < > 72  72  --  92 91  87  --  89 91  BUN 19   < > 27*  27*  --  17 16  16   --  18 26*  CREATININE 1.35*   < > 1.30*  1.30*  --  1.08* 1.30*  1.31*  --  1.05* 1.24*  CALCIUM 8.3*  --   --   --  8.0* 8.0*  7.8*  --  8.6* 9.1  MG 1.8  --   --   --  1.6* 1.9  --   1.9 1.8  PHOS  --   --   --   --  4.2  --   --   --  4.1   < > = values in this interval not displayed.   GFR: Estimated Creatinine Clearance: 47.5 mL/min (A) (by C-G formula based on SCr of 1.24 mg/dL (H)). Recent Labs  Lab 02/22/20 0549 02/23/20 0536 02/24/20 0321 02/25/20 0448  WBC 6.9 7.5 7.8 8.6    Liver Function Tests: Recent Labs  Lab 02/21/20 0829 02/22/20 0549 02/23/20 0536 02/24/20 0321  AST 33 25 21 17   ALT 21 20 18 13   ALKPHOS 99 90 85 70  BILITOT 0.7 0.9 1.1 1.0  PROT 7.3 6.7 6.7 6.8  ALBUMIN 2.9* 2.7* 2.7* 3.5   No results for input(s): LIPASE, AMYLASE in the last 168 hours. No results for input(s): AMMONIA in the last 168 hours.  ABG    Component Value Date/Time   PHART 7.424 (H) 10/04/2010 0930   PCO2ART 41.6 10/04/2010 0930   PO2ART 59.9 (L) 10/04/2010 0930   HCO3 35.3 (H) 02/23/2020 0918   HCO3 34.8 (H) 02/23/2020 0918   TCO2 37 (H) 02/23/2020 0918   TCO2 37 (H) 02/23/2020 0918   ACIDBASEDEF 2.3 (H) 09/12/2010 0501   O2SAT 62.0 02/23/2020 0918   O2SAT 63.0 02/23/2020 0918     Coagulation Profile: Recent Labs  Lab 02/21/20 0829 02/22/20 0549  INR 1.2 1.2    Cardiac Enzymes: No results for input(s): CKTOTAL, CKMB, CKMBINDEX, TROPONINI in the last 168 hours.  HbA1C: Hgb A1c MFr Bld  Date/Time Value Ref Range Status  02/21/2020 01:16 PM 6.4 (H) 4.8 - 5.6 % Final    Comment:    (NOTE) Pre diabetes:          5.7%-6.4% Diabetes:              >6.4% Glycemic control for   <7.0% adults with diabetes     CBG: No results for input(s): GLUCAP in the last 168 hours.  Julian Hy, DO 02/25/20 4:02 PM [facility]

## 2020-02-25 NOTE — Progress Notes (Signed)
VASCULAR LAB PRELIMINARY  PRELIMINARY  PRELIMINARY  PRELIMINARY  Bilateral lower extremity venous duplex completed.    Preliminary report:  See CV proc for preliminary results  Cartez Mogle, RVT 02/25/2020, 1:50 PM

## 2020-02-25 NOTE — Progress Notes (Signed)
PROGRESS NOTE    Michele Parrish  KCM:[id number] DOB: [birth year] DOA: 02/21/2020 PCP: Loletta Specter, PA-C    Chief Complaint  Patient presents with   Shortness of Breath    Brief Narrative:  Admission note by Dr Tyson Babinski " Michele Parrish is a 54 y.o. female with past medical history of chronic bronchitis supposed to be on nebulizers but not on it, presented to hospital with complaints of worsening shortness of breath for the last 3 weeks.  Patient stated that has been getting worse recently and was unable to sleep due to dyspnea suggestive of orthopnea. She takes few steps and is very short winded.  She has been noticing swelling of her bilateral breasts mostly on the left and her abdomen girth has increased as well.  She also notices increasing leg swelling bilaterally.  Patient currently smokes. Patient denied any chest pain, sputum production, phlegm production.  No fever, chills or rigor.  No urinary urgency, frequency or dysuria.  Denies any changes in her bowel habits.  Denies sick contacts or recent travel."  ED Course: Patient was noted to be hypoxic saturating 80% on room air by EMS.  In the ED, patient complained of dyspnea and was put on supplemental oxygen at 5 L with saturation around 97%. Patient was then considered for admission to the hospital.   4/30: Diuresed.  Underwent cardiac cath that showed severe pulmonary hypertension. 5/2: Desaturations.  Agitations at night.   Assessment & Plan:   Principal Problem:   Acute hypoxemic respiratory failure (HCC) Active Problems:   COPD with chronic bronchitis (HCC)   Acute exacerbation of CHF (congestive heart failure) (HCC)   Hypomagnesemia   AKI (acute kidney injury) (HCC)   Anasarca   Tobacco abuse   Moderate to severe pulmonary hypertension (HCC)   Pulmonary nodules   Debility   Acute diastolic CHF (congestive heart failure) (HCC)  1. acute hypoxic respiratory failure secondary to acute diastolic heart  failure/right heart failure in the setting of severe pulmonary hypertension: Multifactorial. COPD, pulmonary nodules suspect chronic lung disease, rule out sarcoidosis Severe pulmonary hypertension, pulmonology consulted and they are working up.  VQ scan and lower extremity duplexes. Gentle diuresis to continue.  Now on oral diuresis. Patient has 27 L negative balance since admission.  Her leg swelling and breast swelling has improved much. Continue on oxygen to keep saturation more than 88%.  We will continue to wean down.  May need home oxygen on discharge. On bronchodilator therapy.  Followed by pulmonology.  2.  Acute kidney injury Likely secondary to problem #1.  Renal functions fluctuate but fairly stable..  3.  Hypomagnesemia Replaced.  Adequate.  4.  Multiple pulmonary nodules Questionable etiology.  Will likely need outpatient follow-up with pulmonary with repeat CT scan in about 3 months.  Followed by pulmonology.  May need bronc and biopsy.  5.  Anasarca/hypoalbuminemia: Albumin 2.7. Today albumin improved after albumin infusion.  Not a true reflection.  TSH is acceptable.   Urinalysis pending.  6.  Tobacco abuse Tobacco cessation.  Continue nicotine patch.  Patient is deteremind to quit.  7.  Debility/weakness PT/OT.  DVT prophylaxis: Lovenox Code Status: Full Family Communication: Updated patient.  No family at bedside. Disposition:   Status is: Inpatient  Dispo: The patient is from: Home              Anticipated d/c is to: Likely home.              Anticipated  d/c date is: To be determined.         Currently on active treatment.  On oxygen.        Consultants:   Cardiology: Dr. Antoine Poche 02/22/2020  Pulmonology  Procedures:   2D echo 02/21/2020  Chest x-ray 02/21/2020  CT angiogram chest 02/21/2020  Right heart cath  02/23/2020  MRI of the heart, right ventricular dilatation.  Antimicrobials:   None   Subjective:  Patient seen and  examined.  Overnight events noted.  Noted to be anxious and agitated at night with desaturations. Patient was quite upset today, however she was able to talk and keep up conversation. When she is quiet and resting, her oxygen is more than 90%.  Oxygen saturation drops on minimal ambulation, agitation or movements. She is tired of people asking her to put oxygen on her nose and everybody talking to her to put oxygen in her nose.  Denies any hallucinations or delusions.  Objective: Vitals:   02/25/20 0456 02/25/20 0500 02/25/20 1104 02/25/20 1134  BP: 110/78     Pulse: 94  95 95  Resp: (!) 37 19 (!) 29 (!) 24  Temp: 98.5 F (36.9 C)     TempSrc: Oral     SpO2: 98%  92% 94%  Weight: 65 kg     Height:        Intake/Output Summary (Last 24 hours) at 02/25/2020 1339 Last data filed at 02/25/2020 0457 Gross per 24 hour  Intake --  Output 9800 ml  Net -9800 ml   Filed Weights   02/23/20 0610 02/23/20 1000 02/25/20 0456  Weight: 69.3 kg 63.5 kg 65 kg    Examination:  Physical Exam  Constitutional: She is oriented to person, place, and time.  Chronically sick looking.  Remains comfortable.  Currently on room air.  Saturations at rest 92%.   HENT:  Head: Normocephalic.  Eyes: Pupils are equal, round, and reactive to light.  Cardiovascular: Normal rate and regular rhythm.  Pulmonary/Chest: Breath sounds normal.  No added sounds.  Musculoskeletal:     Cervical back: Neck supple.  Neurological: She is oriented to person, place, and time.  Psychiatric:  Still slightly anxious and impulsive but readily reoriented.  Both legs trace edema. Her edema on her chest, dependent edema on her left breast is mostly improved.    Data Reviewed: I have personally reviewed following labs and imaging studies  CBC: Recent Labs  Lab 02/21/20 0829 02/21/20 0839 02/22/20 0549 02/23/20 0536 02/23/20 0918 02/24/20 0321 02/25/20 0448  WBC 7.7  --  6.9 7.5  --  7.8 8.6  NEUTROABS 3.6  --   --   3.5  --   --  4.4  HGB 12.4   < > 11.4* 11.8* 13.9   13.9 11.5* 11.4*  HCT 41.7   < > 38.4 39.2 41.0   41.0 37.8 37.9  MCV 86.5  --  86.1 86.2  --  84.8 83.8  PLT 209  --  184 200  --  191 203   < > = values in this interval not displayed.    Basic Metabolic Panel: Recent Labs  Lab 02/21/20 0829 02/21/20 0829 02/21/20 0839 02/21/20 1316 02/22/20 0549 02/23/20 0536 02/23/20 0918 02/24/20 0321 02/25/20 0448  NA 142   < > 142   142  --  140 141   138 142   142 143 141  K 4.3   < > 6.8*   6.8*   < > 4.0  3.7   3.7 3.9   3.8 4.1 4.2  CL 115*   < > 110   110  --  110 102   102  --  99 98  CO2 22  --   --   --  25 32   29  --  31 35*  GLUCOSE 78   < > 72   72  --  92 91   87  --  89 91  BUN 19   < > 27*   27*  --  17 16   16   --  18 26*  CREATININE 1.35*   < > 1.30*   1.30*  --  1.08* 1.30*   1.31*  --  1.05* 1.24*  CALCIUM 8.3*  --   --   --  8.0* 8.0*   7.8*  --  8.6* 9.1  MG 1.8  --   --   --  1.6* 1.9  --  1.9 1.8  PHOS  --   --   --   --  4.2  --   --   --  4.1   < > = values in this interval not displayed.    GFR: Estimated Creatinine Clearance: 47.5 mL/min (A) (by C-G formula based on SCr of 1.24 mg/dL (H)).  Liver Function Tests: Recent Labs  Lab 02/21/20 0829 02/22/20 0549 02/23/20 0536 02/24/20 0321  AST 33 25 21 17   ALT 21 20 18 13   ALKPHOS 99 90 85 70  BILITOT 0.7 0.9 1.1 1.0  PROT 7.3 6.7 6.7 6.8  ALBUMIN 2.9* 2.7* 2.7* 3.5    CBG: No results for input(s): GLUCAP in the last 168 hours.   Recent Results (from the past 240 hour(s))  Respiratory Panel by RT PCR (Flu A&B, Covid) - Nasopharyngeal Swab     Status: None   Collection Time: 02/21/20 10:18 AM   Specimen: Nasopharyngeal Swab  Result Value Ref Range Status   SARS Coronavirus 2 by RT PCR NEGATIVE NEGATIVE Final    Comment: (NOTE) SARS-CoV-2 target nucleic acids are NOT DETECTED. The SARS-CoV-2 RNA is generally detectable in upper respiratoy specimens during the acute phase of infection. The  lowest concentration of SARS-CoV-2 viral copies this assay can detect is 131 copies/mL. A negative result does not preclude SARS-Cov-2 infection and should not be used as the sole basis for treatment or other patient management decisions. A negative result may occur with  improper specimen collection/handling, submission of specimen other than nasopharyngeal swab, presence of viral mutation(s) within the areas targeted by this assay, and inadequate number of viral copies (<131 copies/mL). A negative result must be combined with clinical observations, patient history, and epidemiological information. The expected result is Negative. Fact Sheet for Patients:  Fact Sheet for Healthcare Providers:  This test is not yet ap proved or cleared by the 02/23/20 FDA and  has been authorized for detection and/or diagnosis of SARS-CoV-2 by FDA under an Emergency Use Authorization (EUA). This EUA will remain  in effect (meaning this test can be used) for the duration of the COVID-19 declaration under Section 564(b)(1) of the Act, 21 U.S.C. section 360bbb-3(b)(1), unless the authorization is terminated or revoked sooner.    Influenza A by PCR NEGATIVE NEGATIVE Final   Influenza B by PCR NEGATIVE NEGATIVE Final    Comment: (NOTE) The Xpert Xpress SARS-CoV-2/FLU/RSV assay is intended as an aid in  the diagnosis of influenza from Nasopharyngeal swab specimens and  should  not be used as a sole basis for treatment. Nasal washings and  aspirates are unacceptable for Xpert Xpress SARS-CoV-2/FLU/RSV  testing. Fact Sheet for Patients: https://www.moore.com/https://www.fda.gov/media/142436/download Fact Sheet for Healthcare Providers: https://www.young.biz/https://www.fda.gov/media/142435/download This test is not yet approved or cleared by the [place] FDA and  has been authorized for detection and/or diagnosis of SARS-CoV-2 by  FDA under an Emergency  Use Authorization (EUA). This EUA will remain  in effect (meaning this test can be used) for the duration of the  Covid-19 declaration under Section 564(b)(1) of the Act, 21  U.S.C. section 360bbb-3(b)(1), unless the authorization is  terminated or revoked. Performed at [facility], [address] [place], [place] [address]   Urine culture     Status: None   Collection Time: 02/21/20  3:36 PM   Specimen: Urine, Random  Result Value Ref Range Status   Specimen Description   Final    URINE, RANDOM Performed at [facility], [address] [place], [place] [address]    Special Requests   Final    NONE Performed at [facility], [address] [place], [place] [address]    Culture   Final    NO GROWTH Performed at [facility], [address] [place], [place] [address]    Report Status 02/22/2020 FINAL  Final         Radiology Studies: MR CARDIAC MORPHOLOGY W WO CONTRAST  Result Date: 02/23/2020 CLINICAL DATA:  54 year old female with newly diagnosed right sided CHF and severe pulmonary hypertension. EXAM: CARDIAC MRI TECHNIQUE: The patient was scanned on a 1.5 Tesla GE magnet. A dedicated cardiac coil was used. Functional imaging was done using Fiesta sequences. 2,3, and 4 chamber views were done to assess for RWMA's. Modified Simpson's rule using a short axis stack was used to calculate an ejection fraction on a dedicated work Research officer, trade unionstation using Circle software. The patient received 8 cc of Gadavist. After 10 minutes inversion recovery sequences were used to assess for infiltration and scar tissue. CONTRAST:  8 cc  of Gadavist FINDINGS: 1. Small underfilling left ventricle with normal thickness and hyperdynamic systolic function (LVEF = 66%). There are no regional wall motion abnormalities. D-shaped interventricular septum in systole and diastole is consistent with RV pressure and volume overload. There is no late  gadolinium enhancement in the left ventricular myocardium. LVEDD: 42 mm LVESD: 25 mm LVEDV: 74 ml LVESV: 25 ml SV: 49 ml CO: 5.1 L/min Myocardial mass: 98 g 2. Severely dilated right ventricular size, with moderate right ventricular hypertrophy and severely decreased systolic function (LVEF = 24%). There are no regional wall motion abnormalities. 3.  Normal left atrial size.  Moderately dilated right atrium. 4. Normal size of the aortic root, ascending aorta. Severely dilated pulmonary artery measuring 45 mm consistent with pulmonary hypertension. 5.  Trivial mitral and moderate tricuspid regurgitation. 6. Normal pericardium. Mild to moderate circumferential pericardial effusion with maximum diameter 12 mm. Dilated IVC measuring 23 mm and no collapse. IMPRESSION: Study interpretation affected by patient's inability to hold her breath and motion during the exam. 1. Small underfilling left ventricle with normal thickness and hyperdynamic systolic function (LVEF = 66%). There are no regional wall motion abnormalities. D-shaped interventricular septum in systole and diastole is consistent with RV pressure and volume overload. There is no late gadolinium enhancement in the left ventricular myocardium. 2. Severely dilated right ventricular size, with moderate right ventricular hypertrophy and severely decreased systolic function (LVEF = 24%).  There are no regional wall motion abnormalities. 3.  Normal left atrial size.  Moderately dilated right atrium. 4. Normal size of the aortic root, ascending aorta. Severely dilated pulmonary artery measuring 45 mm consistent with pulmonary hypertension. 5.  Trivial mitral and moderate tricuspid regurgitation. 6. Normal pericardium. Mild to moderate circumferential pericardial effusion with maximum diameter 12 mm. Dilated IVC measuring 23 mm and no collapse. These findings are consistent with isolated severe right ventricular failure leading to left sided underfilling and moderate  circumferential pericardial effusion. Electronically Signed   By: Ena Dawley   On: 02/23/2020 19:52   US Abdomen Limited RUQ  Result Date: 02/23/2020 CLINICAL DATA:  Hepatomegaly on physical exam EXAM: ULTRASOUND ABDOMEN LIMITED RIGHT UPPER QUADRANT COMPARISON:  None. FINDINGS: Gallbladder: No gallstones or wall thickening visualized. No sonographic Murphy sign noted by sonographer. Common bile duct: Diameter: 3.7 mm Liver: No focal lesion identified. Within normal limits in parenchymal echogenicity. Portal vein is patent on color Doppler imaging with normal direction of blood flow towards the liver. Other: None. IMPRESSION: Unremarkable right upper quadrant ultrasound. Electronically Signed   By: Inez Catalina M.D.   On: 02/23/2020 23:09        Scheduled Meds:  budesonide (PULMICORT) nebulizer solution  0.5 mg Nebulization BID   Chlorhexidine Gluconate Cloth  6 each Topical Daily   docusate sodium  100 mg Oral BID   enoxaparin (LOVENOX) injection  40 mg Subcutaneous Q24H   feeding supplement (ENSURE ENLIVE)  237 mL Oral BID BM   folic acid  1 mg Oral Daily   furosemide  40 mg Oral BID   guaiFENesin  600 mg Oral BID   multivitamin with minerals  1 tablet Oral Daily   nicotine  14 mg Transdermal Daily   sodium chloride flush  3 mL Intravenous Q12H   thiamine  100 mg Oral Daily   Continuous Infusions:  sodium chloride       LOS: 4 days    Time spent: 30 minutes    Barb Merino, MD [facility]

## 2020-02-25 NOTE — Progress Notes (Signed)
Progress Note  Patient Name: Michele Parrish Date of Encounter: 02/25/2020  Primary Cardiologist: Rollene Rotunda, MD   Subjective   She is somnolent and not particularly cooperative today.  Events of last evening with confusion and desaturation noted.   O2 sats 88 but goes up when she breaths through her nose and wakes up.    Inpatient Medications    Scheduled Meds: . budesonide (PULMICORT) nebulizer solution  0.5 mg Nebulization BID  . Chlorhexidine Gluconate Cloth  6 each Topical Daily  . docusate sodium  100 mg Oral BID  . enoxaparin (LOVENOX) injection  40 mg Subcutaneous Q24H  . feeding supplement (ENSURE ENLIVE)  237 mL Oral BID BM  . folic acid  1 mg Oral Daily  . furosemide  40 mg Oral BID  . guaiFENesin  600 mg Oral BID  . multivitamin with minerals  1 tablet Oral Daily  . nicotine  14 mg Transdermal Daily  . sodium chloride flush  3 mL Intravenous Q12H  . thiamine  100 mg Oral Daily   Continuous Infusions: . sodium chloride     PRN Meds: sodium chloride, acetaminophen **OR** acetaminophen, bisacodyl, ipratropium-albuterol, LORazepam, ondansetron **OR** ondansetron (ZOFRAN) IV, polyethylene glycol, sodium chloride flush   Vital Signs    Vitals:   02/24/20 0823 02/24/20 0830 02/25/20 0456 02/25/20 0500  BP:   110/78   Pulse:   94   Resp:   (!) 37 19  Temp:   98.5 F (36.9 C)   TempSrc:   Oral   SpO2: 95% 95% 98%   Weight:   65 kg   Height:        Intake/Output Summary (Last 24 hours) at 02/25/2020 1111 Last data filed at 02/25/2020 0457 Gross per 24 hour  Intake --  Output 9800 ml  Net -9800 ml   Last 3 Weights 02/25/2020 02/23/2020 02/23/2020  Weight (lbs) 143 lb 4.8 oz 140 lb 1.6 oz 152 lb 12.5 oz  Weight (kg) 65 kg 63.549 kg 69.3 kg      Telemetry    NSR, ST - Personally Reviewed  ECG    No new tracings - Personally Reviewed  Physical Exam   GEN: No  acute distress.   Neck:   Positive JVD Cardiac: RRR, no murmurs, rubs, or gallops.   Distant heart sounds Respiratory:    Decreased breath sounds bilateral GI: Soft, nontender, non-distended, normal bowel sounds  MS:  No leg edema; No deformity.  (chest edema is improved)  Neuro:   Nonfocal  Psych:   Somnolent but arousable.   Labs    High Sensitivity Troponin:   Recent Labs  Lab 02/21/20 1316 02/21/20 1605  TROPONINIHS 16 15      Chemistry Recent Labs  Lab 02/22/20 0549 02/22/20 0549 02/23/20 0536 02/23/20 0536 02/23/20 0918 02/24/20 0321 02/25/20 0448  NA 140   < > 141  138   < > 142  142 143 141  K 4.0   < > 3.7  3.7   < > 3.9  3.8 4.1 4.2  CL 110   < > 102  102  --   --  99 98  CO2 25   < > 32  29  --   --  31 35*  GLUCOSE 92   < > 91  87  --   --  89 91  BUN 17   < > 16  16  --   --  18 26*  CREATININE  1.08*   < > 1.30*  1.31*  --   --  1.05* 1.24*  CALCIUM 8.0*   < > 8.0*  7.8*  --   --  8.6* 9.1  PROT 6.7  --  6.7  --   --  6.8  --   ALBUMIN 2.7*  --  2.7*  --   --  3.5  --   AST 25  --  21  --   --  17  --   ALT 20  --  18  --   --  13  --   ALKPHOS 90  --  85  --   --  70  --   BILITOT 0.9  --  1.1  --   --  1.0  --   GFRNONAA 59*   < > 47*  47*  --   --  >60 50*  GFRAA >60   < > 55*  55*  --   --  >60 58*  ANIONGAP 5   < > 7  7  --   --  13 8   < > = values in this interval not displayed.     Hematology Recent Labs  Lab 02/23/20 0536 02/23/20 0536 02/23/20 0918 02/24/20 0321 02/25/20 0448  WBC 7.5  --   --  7.8 8.6  RBC 4.55  --   --  4.46 4.52  HGB 11.8*   < > 13.9  13.9 11.5* 11.4*  HCT 39.2   < > 41.0  41.0 37.8 37.9  MCV 86.2  --   --  84.8 83.8  MCH 25.9*  --   --  25.8* 25.2*  MCHC 30.1  --   --  30.4 30.1  RDW 18.6*  --   --  18.1* 18.2*  PLT 200  --   --  191 203   < > = values in this interval not displayed.    BNP Recent Labs  Lab 02/21/20 0829 02/22/20 0549  BNP 986.4* 846.6*     DDimer No results for input(s): DDIMER in the last 168 hours.   Radiology    MR CARDIAC MORPHOLOGY W WO  CONTRAST  Result Date: 02/23/2020 CLINICAL DATA:  54 year old female with newly diagnosed right sided CHF and severe pulmonary hypertension. EXAM: CARDIAC MRI TECHNIQUE: The patient was scanned on a 1.5 Tesla GE magnet. A dedicated cardiac coil was used. Functional imaging was done using Fiesta sequences. 2,3, and 4 chamber views were done to assess for RWMA's. Modified Simpson's rule using a short axis stack was used to calculate an ejection fraction on a dedicated work [employer]. The patient received 8 cc of Gadavist. After 10 minutes inversion recovery sequences were used to assess for infiltration and scar tissue. CONTRAST:  8 cc  of Gadavist FINDINGS: 1. Small underfilling left ventricle with normal thickness and hyperdynamic systolic function (LVEF = 66%). There are no regional wall motion abnormalities. D-shaped interventricular septum in systole and diastole is consistent with RV pressure and volume overload. There is no late gadolinium enhancement in the left ventricular myocardium. LVEDD: 42 mm LVESD: 25 mm LVEDV: 74 ml LVESV: 25 ml SV: 49 ml CO: 5.1 L/min Myocardial mass: 98 g 2. Severely dilated right ventricular size, with moderate right ventricular hypertrophy and severely decreased systolic function (LVEF = 24%). There are no regional wall motion abnormalities. 3.  Normal left atrial size.  Moderately dilated right atrium. 4. Normal size of the aortic root,  ascending aorta. Severely dilated pulmonary artery measuring 45 mm consistent with pulmonary hypertension. 5.  Trivial mitral and moderate tricuspid regurgitation. 6. Normal pericardium. Mild to moderate circumferential pericardial effusion with maximum diameter 12 mm. Dilated IVC measuring 23 mm and no collapse. IMPRESSION: Study interpretation affected by patient's inability to hold her breath and motion during the exam. 1. Small underfilling left ventricle with normal thickness and hyperdynamic systolic function (LVEF =  66%). There are no regional wall motion abnormalities. D-shaped interventricular septum in systole and diastole is consistent with RV pressure and volume overload. There is no late gadolinium enhancement in the left ventricular myocardium. 2. Severely dilated right ventricular size, with moderate right ventricular hypertrophy and severely decreased systolic function (LVEF = 24%). There are no regional wall motion abnormalities. 3.  Normal left atrial size.  Moderately dilated right atrium. 4. Normal size of the aortic root, ascending aorta. Severely dilated pulmonary artery measuring 45 mm consistent with pulmonary hypertension. 5.  Trivial mitral and moderate tricuspid regurgitation. 6. Normal pericardium. Mild to moderate circumferential pericardial effusion with maximum diameter 12 mm. Dilated IVC measuring 23 mm and no collapse. These findings are consistent with isolated severe right ventricular failure leading to left sided underfilling and moderate circumferential pericardial effusion. Electronically Signed   By: Ena Dawley   On: 02/23/2020 19:52   US Abdomen Limited RUQ  Result Date: 02/23/2020 CLINICAL DATA:  Hepatomegaly on physical exam EXAM: ULTRASOUND ABDOMEN LIMITED RIGHT UPPER QUADRANT COMPARISON:  None. FINDINGS: Gallbladder: No gallstones or wall thickening visualized. No sonographic Murphy sign noted by sonographer. Common bile duct: Diameter: 3.7 mm Liver: No focal lesion identified. Within normal limits in parenchymal echogenicity. Portal vein is patent on color Doppler imaging with normal direction of blood flow towards the liver. Other: None. IMPRESSION: Unremarkable right upper quadrant ultrasound. Electronically Signed   By: Inez Catalina M.D.   On: 02/23/2020 23:09    Cardiac Studies   Right heart cath  Right heart catheterization was done via the right antecubital vein without complication.  Moderately severely elevated right-sided filling pressures with moderate to  severe pulmonary hypertension and normal cardiac output  RA: 16 mmHg RV: 60 / 11 mmHg Pulmonary capillary wedge pressure: 16 mmHg PA: 66/26 with a mean of 40 mmHg Ao sat 92% PA sat 63% Cardiac output is 4.92 with a cardiac index of 2.86. Pulmonary vascular resistance: 4.9 Wood units  Recommendations: Pulmonary hypertension seems to be mixed venous and arterial but mostly arterial given that wedge pressure is only 16.  Avoid overdiuresis. I switched furosemide to oral. Continue evaluation of pulmonary hypertension and RV dysfunction.  Echo 02/21/20: 1. Left ventricular ejection fraction, by estimation, is 60 to 65%. The  left ventricle has normal function. The left ventricle has no regional  wall motion abnormalities. Left ventricular diastolic parameters are  consistent with Grade I diastolic  dysfunction (impaired relaxation).  2. There appear to be significant trabeculations in the RV apex. Given RV  dysfunction, recommend cardiac MRI to evaluate RV further. Right  ventricular systolic function is moderately reduced. The right ventricular  size is mildly enlarged. There is  severely elevated pulmonary artery systolic pressure. The estimated right  ventricular systolic pressure is 61.6 mmHg.  3. Right atrial size was severely dilated.  4. The mitral valve is normal in structure. No evidence of mitral valve  regurgitation. No evidence of mitral stenosis.  5. Tricuspid valve regurgitation is moderate.  6. The aortic valve is normal in structure. Aortic valve  regurgitation is  not visualized. No aortic stenosis is present.  7. The inferior vena cava is dilated in size with <50% respiratory  variability, suggesting right atrial pressure of 15 mmHg.    Patient Profile     54 y.o. female with a hx of chronic bronchitis not on inhalers and COPD who is being seen for shortness of breath.  Assessment & Plan     Moderate right ventricular heart dysfunction Pulmonary HTN  noted on echo and on MRI.  MRI with severe RV dilatation.  There is a moderate pericardial effusion on MRI.  No infiltrative myocardial pattern.    Pulmonary HTN not likely explained by LV diastolic dysfunction.  VQ and venous Doppler are ordered.    She has excellent diuresis on oral Lasix and swelling is much improved.  Continue current therapy.  I did discuss with CCM (Dr. Chestine Spore) as the patient is more somnolent and more tachypnea.  They will be rounding soon.     AKI Creat is up but stable.    For questions or updates, please contact [facility] Please consult [email or web address] for contact info under        Signed, Rollene Rotunda, MD  02/25/2020, 11:11 AM

## 2020-02-26 ENCOUNTER — Other Ambulatory Visit: Payer: Self-pay | Admitting: Physician Assistant

## 2020-02-26 ENCOUNTER — Inpatient Hospital Stay (HOSPITAL_COMMUNITY): Payer: Medicaid Other

## 2020-02-26 DIAGNOSIS — I5031 Acute diastolic (congestive) heart failure: Secondary | ICD-10-CM

## 2020-02-26 DIAGNOSIS — N179 Acute kidney failure, unspecified: Secondary | ICD-10-CM

## 2020-02-26 DIAGNOSIS — I313 Pericardial effusion (noninflammatory): Secondary | ICD-10-CM

## 2020-02-26 DIAGNOSIS — I3139 Other pericardial effusion (noninflammatory): Secondary | ICD-10-CM

## 2020-02-26 LAB — BASIC METABOLIC PANEL
Anion gap: 10 (ref 5–15)
BUN: 25 mg/dL — ABNORMAL HIGH (ref 6–20)
CO2: 37 mmol/L — ABNORMAL HIGH (ref 22–32)
Calcium: 9.4 mg/dL (ref 8.9–10.3)
Chloride: 94 mmol/L — ABNORMAL LOW (ref 98–111)
Creatinine, Ser: 1.27 mg/dL — ABNORMAL HIGH (ref 0.44–1.00)
GFR calc Af Amer: 57 mL/min — ABNORMAL LOW (ref >60)
GFR calc non Af Amer: 49 mL/min — ABNORMAL LOW (ref >60)
Glucose, Bld: 121 mg/dL — ABNORMAL HIGH (ref 70–99)
Potassium: 3.9 mmol/L (ref 3.5–5.1)
Sodium: 141 mmol/L (ref 135–145)

## 2020-02-26 LAB — BLOOD GAS, ARTERIAL
Acid-Base Excess: 12.1 mmol/L — ABNORMAL HIGH (ref 0.0–2.0)
Bicarbonate: 36.9 mmol/L — ABNORMAL HIGH (ref 20.0–28.0)
Drawn by: 164
FIO2: 21
O2 Saturation: 87.4 %
Patient temperature: 37.5
pCO2 arterial: 56.1 mmHg — ABNORMAL HIGH (ref 32.0–48.0)
pH, Arterial: 7.436 (ref 7.350–7.450)
pO2, Arterial: 57.5 mmHg — ABNORMAL LOW (ref 83.0–108.0)

## 2020-02-26 LAB — ECHOCARDIOGRAM LIMITED
Height: 62 in
Weight: 2292.78 oz

## 2020-02-26 MED ORDER — TECHNETIUM TO 99M ALBUMIN AGGREGATED
1.5300 | Freq: Once | INTRAVENOUS | Status: AC | PRN
  Administered 2020-02-26: 1.53 via INTRAVENOUS

## 2020-02-26 MED ORDER — LORAZEPAM 2 MG/ML IJ SOLN
INTRAMUSCULAR | Status: AC
  Filled 2020-02-26: qty 1

## 2020-02-26 MED ORDER — LORAZEPAM 2 MG/ML IJ SOLN
0.5000 mg | Freq: Once | INTRAMUSCULAR | Status: AC
  Administered 2020-02-26: 0.5 mg via INTRAVENOUS

## 2020-02-26 NOTE — Progress Notes (Signed)
PROGRESS NOTE    Michele Parrish  YTK:[id number] DOB: 1965/12/24 DOA: 02/21/2020 PCP: Clent Demark, PA-C    Chief Complaint  Patient presents with   Shortness of Breath    Brief Narrative:  Admission note by Dr Louanne Belton " Michele Parrish is a 54 y.o. female with past medical history of chronic bronchitis supposed to be on nebulizers but not on it, presented to hospital with complaints of worsening shortness of breath for the last 3 weeks.  Patient stated that has been getting worse recently and was unable to sleep due to dyspnea suggestive of orthopnea. She takes few steps and is very short winded.  She has been noticing swelling of her bilateral breasts mostly on the left and her abdomen girth has increased as well.  She also notices increasing leg swelling bilaterally.  Patient currently smokes. Patient denied any chest pain, sputum production, phlegm production.  No fever, chills or rigor.  No urinary urgency, frequency or dysuria.  Denies any changes in her bowel habits.  Denies sick contacts or recent travel."  ED Course: Patient was noted to be hypoxic saturating 80% on room air by EMS.  In the ED, patient complained of dyspnea and was put on supplemental oxygen at 5 L with saturation around 97%. Patient was then considered for admission to the hospital.   4/30: Diuresed.  Underwent cardiac cath that showed severe pulmonary hypertension. 5/2-5/3: Desaturations.  Agitations at night. Angry and impulsive specially at night.   Assessment & Plan:   Principal Problem:   Acute hypoxemic respiratory failure (HCC) Active Problems:   COPD with chronic bronchitis (HCC)   Acute exacerbation of CHF (congestive heart failure) (HCC)   Hypomagnesemia   AKI (acute kidney injury) ([place])   Anasarca   Tobacco abuse   Moderate to severe pulmonary hypertension (HCC)   Pulmonary nodules   Debility   Acute diastolic CHF (congestive heart failure) (HCC)  1. acute hypoxic respiratory  failure secondary to acute diastolic heart failure/right heart failure in the setting of severe pulmonary hypertension: Multifactorial. COPD, pulmonary nodules suspect chronic lung disease, rule out sarcoidosis Severe pulmonary hypertension, pulmonology consulted and they are working up.  VQ scan pending. DVT studies normal.  Gentle diuresis to continue.  Now on oral diuresis. Patient has 34 L negative balance since admission.  Her leg swelling and breast swelling has improved much. Continue on oxygen to keep saturation more than 88%.  We will continue to wean down.  May need home oxygen on discharge. On bronchodilator therapy.  Followed by pulmonology.  2.  Acute kidney injury Likely secondary to problem #1.  Renal functions fluctuate but fairly stable..  3.  Hypomagnesemia Replaced.  Adequate.  4.  Multiple pulmonary nodules Questionable etiology.  Will likely need outpatient follow-up with pulmonary with repeat CT scan in about 3 months.  Followed by pulmonology.  May need bronc and biopsy.  5.  Anasarca/hypoalbuminemia: Albumin 2.7. Urine with no protein, probably from chronic illness.  6.  Tobacco abuse Tobacco cessation.  Continue nicotine patch.  Patient is deteremind to quit.  7.  Debility/weakness PT/OT.  8. Agitation, impulsiveness: mostly related to hypoxia. Unhappy with the providers. Counseled and agreeable to stay.  Denies daily alcohol use, will use low dose ativan to help agitation and anxiety.  DVT prophylaxis: Lovenox Code Status: Full Family Communication: Updated patient.  No family at bedside. She denies any next of kin and does not want me to notify any one.  Disposition:  Status is: Inpatient  Dispo: The patient is from: Home              Anticipated d/c is to: Likely home.              Anticipated d/c date is: To be determined.         Currently on active treatment.  On oxygen.        Consultants:   Cardiology: Dr. Antoine PocheHochrein  02/22/2020  Pulmonology  Procedures:   2D echo 02/21/2020  Chest x-ray 02/21/2020  CT angiogram chest 02/21/2020  Right heart cath  02/23/2020  MRI of the heart, right ventricular dilatation.  Duplexes, normal.  Antimicrobials:   None   Subjective:  Patient seen and examined.  Overnight events noted.  Noted to be anxious and agitated at night with desaturations. She is tired of people asking her to put oxygen on her nose and everybody talking to her to put oxygen in her nose.  Denies any hallucinations or delusions.  Objective: Vitals:   02/25/20 2103 02/26/20 0131 02/26/20 0432 02/26/20 0501  BP: 110/68 (!) 92/58 92/60   Pulse: 92 91 94   Resp: (!) 24 20 20    Temp: 99.5 F (37.5 C) 98.7 F (37.1 C) 98.7 F (37.1 C) 98.5 F (36.9 C)  TempSrc: Oral Oral Oral Oral  SpO2: 94% 94% 95%   Weight:   65 kg   Height:        Intake/Output Summary (Last 24 hours) at 02/26/2020 0838 Last data filed at 02/26/2020 0500 Gross per 24 hour  Intake 960 ml  Output 8300 ml  Net -7340 ml   Filed Weights   02/23/20 1000 02/25/20 0456 02/26/20 0432  Weight: 63.5 kg 65 kg 65 kg    Examination:  Physical Exam  Constitutional: She is oriented to person, place, and time.  Chronically sick looking.  Remains comfortable.  Currently on room air.  Saturations at rest 92%.   HENT:  Head: Normocephalic.  Eyes: Pupils are equal, round, and reactive to light.  Cardiovascular: Normal rate and regular rhythm.  Pulmonary/Chest: Breath sounds normal.  No added sounds.  Musculoskeletal:     Cervical back: Neck supple.  Neurological: She is oriented to person, place, and time.  Psychiatric:  Still slightly anxious and impulsive but readily reoriented.  Both legs trace edema. Her edema on her chest, dependent edema on her left breast is mostly improved.    Data Reviewed: I have personally reviewed following labs and imaging studies  CBC: Recent Labs  Lab 02/21/20 0829 02/21/20 0839  02/22/20 0549 02/23/20 0536 02/23/20 0918 02/24/20 0321 02/25/20 0448  WBC 7.7  --  6.9 7.5  --  7.8 8.6  NEUTROABS 3.6  --   --  3.5  --   --  4.4  HGB 12.4   < > 11.4* 11.8* 13.9   13.9 11.5* 11.4*  HCT 41.7   < > 38.4 39.2 41.0   41.0 37.8 37.9  MCV 86.5  --  86.1 86.2  --  84.8 83.8  PLT 209  --  184 200  --  191 203   < > = values in this interval not displayed.    Basic Metabolic Panel: Recent Labs  Lab 02/21/20 0829 02/21/20 0839 02/22/20 0549 02/22/20 0549 02/23/20 0536 02/23/20 0918 02/24/20 0321 02/25/20 0448 02/26/20 0357  NA 142   < > 140   < > 141   138 142   142 143 141 141  K 4.3   < > 4.0   < > 3.7   3.7 3.9   3.8 4.1 4.2 3.9  CL 115*   < > 110  --  102   102  --  99 98 94*  CO2 22   < > 25  --  32   29  --  31 35* 37*  GLUCOSE 78   < > 92  --  91   87  --  89 91 121*  BUN 19   < > 17  --  16   16  --  18 26* 25*  CREATININE 1.35*   < > 1.08*  --  1.30*   1.31*  --  1.05* 1.24* 1.27*  CALCIUM 8.3*   < > 8.0*  --  8.0*   7.8*  --  8.6* 9.1 9.4  MG 1.8  --  1.6*  --  1.9  --  1.9 1.8  --   PHOS  --   --  4.2  --   --   --   --  4.1  --    < > = values in this interval not displayed.    GFR: Estimated Creatinine Clearance: 46.4 mL/min (A) (by C-G formula based on SCr of 1.27 mg/dL (H)).  Liver Function Tests: Recent Labs  Lab 02/21/20 0829 02/22/20 0549 02/23/20 0536 02/24/20 0321  AST 33 25 21 17   ALT 21 20 18 13   ALKPHOS 99 90 85 70  BILITOT 0.7 0.9 1.1 1.0  PROT 7.3 6.7 6.7 6.8  ALBUMIN 2.9* 2.7* 2.7* 3.5    CBG: No results for input(s): GLUCAP in the last 168 hours.   Recent Results (from the past 240 hour(s))  Respiratory Panel by RT PCR (Flu A&B, Covid) - Nasopharyngeal Swab     Status: None   Collection Time: 02/21/20 10:18 AM   Specimen: Nasopharyngeal Swab  Result Value Ref Range Status   SARS Coronavirus 2 by RT PCR NEGATIVE NEGATIVE Final    Comment: (NOTE) SARS-CoV-2 target nucleic acids are NOT DETECTED. The SARS-CoV-2  RNA is generally detectable in upper respiratoy specimens during the acute phase of infection. The lowest concentration of SARS-CoV-2 viral copies this assay can detect is 131 copies/mL. A negative result does not preclude SARS-Cov-2 infection and should not be used as the sole basis for treatment or other patient management decisions. A negative result may occur with  improper specimen collection/handling, submission of specimen other than nasopharyngeal swab, presence of viral mutation(s) within the areas targeted by this assay, and inadequate number of viral copies (<131 copies/mL). A negative result must be combined with clinical observations, patient history, and epidemiological information. The expected result is Negative. Fact Sheet for Patients:  Fact Sheet for Healthcare Providers:  02/23/20 This test is not yet ap proved or cleared by the https://www.moore.com/ FDA and  has been authorized for detection and/or diagnosis of SARS-CoV-2 by FDA under an Emergency Use Authorization (EUA). This EUA will remain  in effect (meaning this test can be used) for the duration of the COVID-19 declaration under Section 564(b)(1) of the Act, 21 U.S.C. section 360bbb-3(b)(1), unless the authorization is terminated or revoked sooner.    Influenza A by PCR NEGATIVE NEGATIVE Final   Influenza B by PCR NEGATIVE NEGATIVE Final    Comment: (NOTE) The Xpert Xpress SARS-CoV-2/FLU/RSV assay is intended as an aid in  the diagnosis of influenza from Nasopharyngeal swab specimens and  should not be  used as a sole basis for treatment. Nasal washings and  aspirates are unacceptable for Xpert Xpress SARS-CoV-2/FLU/RSV  testing. Fact Sheet for Patients: https://www.moore.com/ Fact Sheet for Healthcare Providers: https://www.young.biz/ This test is not yet approved or cleared by the [place] FDA  and  has been authorized for detection and/or diagnosis of SARS-CoV-2 by  FDA under an Emergency Use Authorization (EUA). This EUA will remain  in effect (meaning this test can be used) for the duration of the  Covid-19 declaration under Section 564(b)(1) of the Act, 21  U.S.C. section 360bbb-3(b)(1), unless the authorization is  terminated or revoked. Performed at [facility], [address], [place], [place] [address]   Urine culture     Status: None   Collection Time: 02/21/20  3:36 PM   Specimen: Urine, Random  Result Value Ref Range Status   Specimen Description   Final    URINE, RANDOM Performed at [facility], [address], [place], [place] [address]    Special Requests   Final    NONE Performed at [facility], [address] [place], [place] [address]    Culture   Final    NO GROWTH Performed at [facility], [address], [place], [place] [address]    Report Status 02/22/2020 FINAL  Final         Radiology Studies: DG CHEST PORT 1 VIEW  Result Date: 02/26/2020 CLINICAL DATA:  54 year old female with shortness of breath. EXAM: PORTABLE CHEST 1 VIEW COMPARISON:  Chest radiograph dated 02/25/2020. FINDINGS: Diffuse interstitial coarsening and emphysema. Linear left lung base atelectasis/scarring. No focal consolidation, pleural effusion, pneumothorax. Faint nodular densities over the right mid lung field corresponding to the nodule seen on the prior CT. Mild cardiomegaly. No acute osseous pathology. IMPRESSION: 1. No acute cardiopulmonary process. No significant interval change. 2. Emphysema. 3. Mild cardiomegaly. Electronically Signed   By: Elgie Collard M.D.   On: 02/26/2020 01:11   DG CHEST PORT 1 VIEW  Result Date: 02/25/2020 CLINICAL DATA:  Hypoxia. EXAM: PORTABLE CHEST 1 VIEW COMPARISON:  02/13/2020 and CT 02/21/2020 FINDINGS: Lordotic technique is demonstrated. Lungs are adequately inflated  with mild hazy prominence of the perihilar markings suggesting mild vascular congestion. No lobar consolidation or effusion. Few small nodular opacities over the lateral right mid to lower lung as seen on recent CT. Stable cardiomegaly. Remainder of the exam is unchanged. IMPRESSION: 1.  Stable cardiomegaly with suggestion of mild vascular congestion. 2. Few small nodular opacities over the lateral right mid to lower lung as seen on recent CT. Continue to recommend clinical correlation for underlying malignancy and least follow-up CT 3 months. Electronically Signed   By: Elberta Fortis M.D.   On: 02/25/2020 14:43   VAS [name] LOWER EXTREMITY VENOUS (DVT)  Result Date: 02/25/2020  Lower Venous DVTStudy Indications: SOB.  Comparison Study: No prior study on file for comparison Performing Technologist: Sherren Kerns RVS  Examination Guidelines: A complete evaluation includes B-mode imaging, spectral Doppler, color Doppler, and power Doppler as needed of all accessible portions of each vessel. Bilateral testing is considered an integral part of a complete examination. Limited examinations for reoccurring indications may be performed as noted. The reflux portion of the exam is performed with the patient in reverse Trendelenburg.  +---------+---------------+---------+-----------+----------+--------------+  RIGHT     Compressibility Phasicity Spontaneity Properties Thrombus Aging  +---------+---------------+---------+-----------+----------+--------------+  CFV       Full  Yes       Yes                                    +---------+---------------+---------+-----------+----------+--------------+  SFJ       Full                                                             +---------+---------------+---------+-----------+----------+--------------+  FV Prox   Full                                                             +---------+---------------+---------+-----------+----------+--------------+  FV Mid    Full                                                              +---------+---------------+---------+-----------+----------+--------------+  FV Distal Full                                                             +---------+---------------+---------+-----------+----------+--------------+  PFV       Full                                                             +---------+---------------+---------+-----------+----------+--------------+  POP       Full            Yes       Yes                                    +---------+---------------+---------+-----------+----------+--------------+  PTV       Full                                                             +---------+---------------+---------+-----------+----------+--------------+  PERO      Full                                                             +---------+---------------+---------+-----------+----------+--------------+   +---------+---------------+---------+-----------+----------+--------------+  LEFT      Compressibility Phasicity  Spontaneity Properties Thrombus Aging  +---------+---------------+---------+-----------+----------+--------------+  CFV       Full            Yes       Yes                                    +---------+---------------+---------+-----------+----------+--------------+  SFJ       Full                                                             +---------+---------------+---------+-----------+----------+--------------+  FV Prox   Full                                                             +---------+---------------+---------+-----------+----------+--------------+  FV Mid    Full                                                             +---------+---------------+---------+-----------+----------+--------------+  FV Distal Full                                                             +---------+---------------+---------+-----------+----------+--------------+  PFV       Full                                                              +---------+---------------+---------+-----------+----------+--------------+  POP       Full            Yes       Yes                                    +---------+---------------+---------+-----------+----------+--------------+  PTV       Full                                                             +---------+---------------+---------+-----------+----------+--------------+  PERO      Full                                                             +---------+---------------+---------+-----------+----------+--------------+  Summary: BILATERAL: - No evidence of deep vein thrombosis seen in the lower extremities, bilaterally.  RIGHT: - Ultrasound characteristics of enlarged lymph nodes are noted in the groin.  LEFT: - Ultrasound characteristics of enlarged lymph nodes noted in the groin.  *See table(s) above for measurements and observations. Electronically signed by Sherald Hess MD on 02/25/2020 at 4:07:25 PM.    Final         Scheduled Meds:  budesonide (PULMICORT) nebulizer solution  0.5 mg Nebulization BID   Chlorhexidine Gluconate Cloth  6 each Topical Daily   docusate sodium  100 mg Oral BID   enoxaparin (LOVENOX) injection  40 mg Subcutaneous Q24H   feeding supplement (ENSURE ENLIVE)  237 mL Oral BID BM   folic acid  1 mg Oral Daily   furosemide  40 mg Oral BID   guaiFENesin  600 mg Oral BID   LORazepam       multivitamin with minerals  1 tablet Oral Daily   nicotine  14 mg Transdermal Daily   sodium chloride flush  3 mL Intravenous Q12H   thiamine  100 mg Oral Daily   Continuous Infusions:  sodium chloride       LOS: 5 days    Time spent: 30 minutes    Dorcas Carrow, MD [facility]

## 2020-02-26 NOTE — Progress Notes (Signed)
Patient taking off oxygen mask off, when asked if she could put it back on patient started yelling, cussing, and attempting to hit staff. [place], NP and Rapid Response Nurse at bedside attempting to deescalated the situation. Patient willing to put on Oxygen mask on, will continue to reassess patient.

## 2020-02-26 NOTE — CV Procedure (Signed)
Stat echocardiogram second attempt patient off the floor undergoing other testing.  Leta Jungling RDCS

## 2020-02-26 NOTE — Progress Notes (Signed)
OT Cancellation Note  Patient Details Name: Michele Parrish MRN: [id number] DOB: [birth year]   Cancelled Treatment:    Reason Eval/Treat Not Completed: Patient at procedure or test/ unavailable. OT will follow up later as time allows.   Peterson Ao 02/26/2020, 1:48 PM

## 2020-02-26 NOTE — Progress Notes (Addendum)
Progress Note  Patient Name: RYLAND SMOOTS Date of Encounter: 02/26/2020  Primary Cardiologist: Minus Breeding, MD   Subjective   Continues to have agitation at night and gets very angry and impulsive. BP soft this am at 92/37mmHg and HR in the 90's.    Inpatient Medications    Scheduled Meds: . budesonide (PULMICORT) nebulizer solution  0.5 mg Nebulization BID  . Chlorhexidine Gluconate Cloth  6 each Topical Daily  . docusate sodium  100 mg Oral BID  . enoxaparin (LOVENOX) injection  40 mg Subcutaneous Q24H  . feeding supplement (ENSURE ENLIVE)  237 mL Oral BID BM  . folic acid  1 mg Oral Daily  . furosemide  40 mg Oral BID  . guaiFENesin  600 mg Oral BID  . LORazepam      . multivitamin with minerals  1 tablet Oral Daily  . nicotine  14 mg Transdermal Daily  . sodium chloride flush  3 mL Intravenous Q12H  . thiamine  100 mg Oral Daily   Continuous Infusions: . sodium chloride     PRN Meds: sodium chloride, acetaminophen **OR** acetaminophen, bisacodyl, ipratropium-albuterol, LORazepam, ondansetron **OR** ondansetron (ZOFRAN) IV, polyethylene glycol, sodium chloride flush   Vital Signs    Vitals:   02/25/20 2103 02/26/20 0131 02/26/20 0432 02/26/20 0501  BP: 110/68 (!) 92/58 92/60   Pulse: 92 91 94   Resp: (!) 24 20 20    Temp: 99.5 F (37.5 C) 98.7 F (37.1 C) 98.7 F (37.1 C) 98.5 F (36.9 C)  TempSrc: Oral Oral Oral Oral  SpO2: 94% 94% 95%   Weight:   65 kg   Height:        Intake/Output Summary (Last 24 hours) at 02/26/2020 1610 Last data filed at 02/26/2020 0500 Gross per 24 hour  Intake 960 ml  Output 8300 ml  Net -7340 ml   Last 3 Weights 02/26/2020 02/25/2020 02/23/2020  Weight (lbs) 143 lb 4.8 oz 143 lb 4.8 oz 140 lb 1.6 oz  Weight (kg) 65 kg 65 kg 63.549 kg      Telemetry    NSR  - Personally Reviewed  ECG    No EKG to review - Personally Reviewed  Physical Exam   GEN: Well nourished, well developed in no acute distress HEENT:  Normal NECK: No JVD; No carotid bruits LYMPHATICS: No lymphadenopathy CARDIAC:RRR, no murmurs, rubs, gallops RESPIRATORY:  Clear to auscultation without rales, wheezing or rhonchi  ABDOMEN: Soft, non-tender, non-distended MUSCULOSKELETAL:  No edema; No deformity  SKIN: Warm and dry NEUROLOGIC:  Alert and oriented x 3 PSYCHIATRIC:  Normal affect    Labs    High Sensitivity Troponin:   Recent Labs  Lab 02/21/20 1316 02/21/20 1605  TROPONINIHS 16 15      Chemistry Recent Labs  Lab 02/22/20 0549 02/22/20 0549 02/23/20 0536 02/23/20 0918 02/24/20 0321 02/25/20 0448 02/26/20 0357  NA 140   < > 141  138   < > 143 141 141  K 4.0   < > 3.7  3.7   < > 4.1 4.2 3.9  CL 110   < > 102  102   < > 99 98 94*  CO2 25   < > 32  29   < > 31 35* 37*  GLUCOSE 92   < > 91  87   < > 89 91 121*  BUN 17   < > 16  16   < > 18 26* 25*  CREATININE 1.08*   < >  1.30*  1.31*   < > 1.05* 1.24* 1.27*  CALCIUM 8.0*   < > 8.0*  7.8*   < > 8.6* 9.1 9.4  PROT 6.7  --  6.7  --  6.8  --   --   ALBUMIN 2.7*  --  2.7*  --  3.5  --   --   AST 25  --  21  --  17  --   --   ALT 20  --  18  --  13  --   --   ALKPHOS 90  --  85  --  70  --   --   BILITOT 0.9  --  1.1  --  1.0  --   --   GFRNONAA 59*   < > 47*  47*   < > >60 50* 49*  GFRAA >60   < > 55*  55*   < > >60 58* 57*  ANIONGAP 5   < > 7  7   < > < > = values in this interval not displayed.     Hematology Recent Labs  Lab 02/23/20 0536 02/23/20 0536 02/23/20 0918 02/24/20 0321 02/25/20 0448  WBC 7.5  --   --  7.8 8.6  RBC 4.55  --   --  4.46 4.52  HGB 11.8*   < > 13.9  13.9 11.5* 11.4*  HCT 39.2   < > 41.0  41.0 37.8 37.9  MCV 86.2  --   --  84.8 83.8  MCH 25.9*  --   --  25.8* 25.2*  MCHC 30.1  --   --  30.4 30.1  RDW 18.6*  --   --  18.1* 18.2*  PLT 200  --   --  191 203   < > = values in this interval not displayed.    BNP Recent Labs  Lab 02/21/20 0829 02/22/20 0549  BNP 986.4* 846.6*     DDimer No  results for input(s): DDIMER in the last 168 hours.   Radiology    DG CHEST PORT 1 VIEW  Result Date: 02/26/2020 CLINICAL DATA:  54 year old female with shortness of breath. EXAM: PORTABLE CHEST 1 VIEW COMPARISON:  Chest radiograph dated 02/25/2020. FINDINGS: Diffuse interstitial coarsening and emphysema. Linear left lung base atelectasis/scarring. No focal consolidation, pleural effusion, pneumothorax. Faint nodular densities over the right mid lung field corresponding to the nodule seen on the prior CT. Mild cardiomegaly. No acute osseous pathology. IMPRESSION: 1. No acute cardiopulmonary process. No significant interval change. 2. Emphysema. 3. Mild cardiomegaly. Electronically Signed   By: Elgie Collard M.D.   On: 02/26/2020 01:11   DG CHEST PORT 1 VIEW  Result Date: 02/25/2020 CLINICAL DATA:  Hypoxia. EXAM: PORTABLE CHEST 1 VIEW COMPARISON:  02/13/2020 and CT 02/21/2020 FINDINGS: Lordotic technique is demonstrated. Lungs are adequately inflated with mild hazy prominence of the perihilar markings suggesting mild vascular congestion. No lobar consolidation or effusion. Few small nodular opacities over the lateral right mid to lower lung as seen on recent CT. Stable cardiomegaly. Remainder of the exam is unchanged. IMPRESSION: 1.  Stable cardiomegaly with suggestion of mild vascular congestion. 2. Few small nodular opacities over the lateral right mid to lower lung as seen on recent CT. Continue to recommend clinical correlation for underlying malignancy and least follow-up CT 3 months. Electronically Signed   By: Elberta Fortis M.D.   On: 02/25/2020 14:43   VAS [name] LOWER EXTREMITY VENOUS (  DVT)  Result Date: 02/25/2020  Lower Venous DVTStudy Indications: SOB.  Comparison Study: No prior study on file for comparison Performing Technologist: Sherren Kerns RVS  Examination Guidelines: A complete evaluation includes B-mode imaging, spectral Doppler, color Doppler, and power Doppler as needed of all  accessible portions of each vessel. Bilateral testing is considered an integral part of a complete examination. Limited examinations for reoccurring indications may be performed as noted. The reflux portion of the exam is performed with the patient in reverse Trendelenburg.  +---------+---------------+---------+-----------+----------+--------------+ RIGHT    CompressibilityPhasicitySpontaneityPropertiesThrombus Aging +---------+---------------+---------+-----------+----------+--------------+ CFV      Full           Yes      Yes                                 +---------+---------------+---------+-----------+----------+--------------+ SFJ      Full                                                        +---------+---------------+---------+-----------+----------+--------------+ FV Prox  Full                                                        +---------+---------------+---------+-----------+----------+--------------+ FV Mid   Full                                                        +---------+---------------+---------+-----------+----------+--------------+ FV DistalFull                                                        +---------+---------------+---------+-----------+----------+--------------+ PFV      Full                                                        +---------+---------------+---------+-----------+----------+--------------+ POP      Full           Yes      Yes                                 +---------+---------------+---------+-----------+----------+--------------+ PTV      Full                                                        +---------+---------------+---------+-----------+----------+--------------+ PERO     Full                                                        +---------+---------------+---------+-----------+----------+--------------+   +---------+---------------+---------+-----------+----------+--------------+  LEFT     CompressibilityPhasicitySpontaneityPropertiesThrombus Aging +---------+---------------+---------+-----------+----------+--------------+ CFV      Full           Yes      Yes                                 +---------+---------------+---------+-----------+----------+--------------+ SFJ      Full                                                        +---------+---------------+---------+-----------+----------+--------------+ FV Prox  Full                                                        +---------+---------------+---------+-----------+----------+--------------+ FV Mid   Full                                                        +---------+---------------+---------+-----------+----------+--------------+ FV DistalFull                                                        +---------+---------------+---------+-----------+----------+--------------+ PFV      Full                                                        +---------+---------------+---------+-----------+----------+--------------+ POP      Full           Yes      Yes                                 +---------+---------------+---------+-----------+----------+--------------+ PTV      Full                                                        +---------+---------------+---------+-----------+----------+--------------+ PERO     Full                                                        +---------+---------------+---------+-----------+----------+--------------+     Summary: BILATERAL: - No evidence of deep vein thrombosis seen in the lower extremities, bilaterally.  RIGHT: - Ultrasound characteristics of enlarged lymph nodes are noted in the groin.  LEFT: - Ultrasound characteristics of enlarged lymph nodes noted in the groin.  *See  table(s) above for measurements and observations. Electronically signed by Sherald Hess MD on 02/25/2020 at 4:07:25 PM.    Final     Cardiac  Studies   Right heart cath  Right heart catheterization was done via the right antecubital vein without complication.  Moderately severely elevated right-sided filling pressures with moderate to severe pulmonary hypertension and normal cardiac output  RA: 16 mmHg RV: 60 / 11 mmHg Pulmonary capillary wedge pressure: 16 mmHg PA: 66/26 with a mean of 40 mmHg Ao sat 92% PA sat 63% Cardiac output is 4.92 with a cardiac index of 2.86. Pulmonary vascular resistance: 4.9 Wood units  Recommendations: Pulmonary hypertension seems to be mixed venous and arterial but mostly arterial given that wedge pressure is only 16.  Avoid overdiuresis. I switched furosemide to oral. Continue evaluation of pulmonary hypertension and RV dysfunction.  Echo 02/21/20: 1. Left ventricular ejection fraction, by estimation, is 60 to 65%. The  left ventricle has normal function. The left ventricle has no regional  wall motion abnormalities. Left ventricular diastolic parameters are  consistent with Grade I diastolic  dysfunction (impaired relaxation).  2. There appear to be significant trabeculations in the RV apex. Given RV  dysfunction, recommend cardiac MRI to evaluate RV further. Right  ventricular systolic function is moderately reduced. The right ventricular  size is mildly enlarged. There is  severely elevated pulmonary artery systolic pressure. The estimated right  ventricular systolic pressure is 64.0 mmHg.  3. Right atrial size was severely dilated.  4. The mitral valve is normal in structure. No evidence of mitral valve  regurgitation. No evidence of mitral stenosis.  5. Tricuspid valve regurgitation is moderate.  6. The aortic valve is normal in structure. Aortic valve regurgitation is  not visualized. No aortic stenosis is present.  7. The inferior vena cava is dilated in size with <50% respiratory  variability, suggesting right atrial pressure of 15 mmHg.    1.   Patient Profile      54 y.o. female with a hx of chronic bronchitis not on inhalers and COPD who is being seen for shortness of breath.  Assessment & Plan     1.  Moderate right ventricular heart dysfunction -Pulmonary HTN noted on echo and on MRI.   -MRI with severe RV dilatation.  -moderate pericardial effusion on MRI.   -No infiltrative myocardial pattern.    -Pulmonary HTN felt to be a mixed pattern.   -she is currently on PO diuretics with excellent diuresis on oral Lasix and swelling is much improved.   -Continue current therapy.    2.  Acute hypoxic respiratory failure secondary to acute diastolic heart failure/right heart failure in the setting of severe pulmonary hypertension: Multifactorial. -COPD, pulmonary nodules suspect chronic lung disease, rule out sarcoidosis -Severe pulmonary hypertension, pulmonology consulted and they are working up.   -VQ scan pending. DVT studies normal.  -Continue on O2>>O2 sats 95% on HFNC at 7L -continue diuresis with Lasix 40mg  BID PO > she is net neg 34L -Bronchodilators per pulmonary  3. AKI -Creatinine mildly elevated at 1.27.   4.Pericardial Effusion -noted on cardiac MRI -will repeat limited 2D echo to reassess today and if stable would repeat again in 2 weeks as outpt  I have spent a total of 35 minutes with patient reviewing Cardiac MRI, 2D echo, hospital notes , telemetry, EKGs, labs and examining patient as well as establishing an assessment and plan that was discussed with the patient.  > 50% of time was spent  in direct patient care.    [facility] will sign off.   Medication Recommendations:  Lasix 40mg  BID Other recommendations (labs, testing, etc):  BMET in 1 week Follow up as an outpatient:  Rollene RotundaJames Hochrein, MD   For questions or updates, please contact [facility] Please consult [email or web address] for contact info under        Signed, Armanda Magicraci Farrell Pantaleo, MD  02/26/2020, 9:07 AM

## 2020-02-26 NOTE — Progress Notes (Signed)
ABG drawn today on NRB

## 2020-02-26 NOTE — CV Procedure (Signed)
Stat  Echocardiogram not completed, patient undergoing other testing at this time.  Leta Jungling RDCS

## 2020-02-26 NOTE — Progress Notes (Signed)
Patient taking of HFNC throughout the night. Patient sating in the 70-80s becoming agitated and slightly confused. When told that the HFNC was important she started yelling at staff. Paged [facility] got orders for ABG, and CXR. Charge nurse and Respiratory therapist at bedside. Patient placed on a non rebreather for the mean time. Patient was able to calm down and wear the mask, will reasses if pt wants to wear HFNC.

## 2020-02-26 NOTE — Progress Notes (Signed)
  Echocardiogram 2D Echocardiogram limited has been performed.  Michele Parrish 02/26/2020, 2:43 PM

## 2020-02-26 NOTE — Progress Notes (Signed)
PT Cancellation Note  Patient Details Name: Michele Parrish MRN: [id number] DOB: 24-Jul-1966   Cancelled Treatment:    Reason Eval/Treat Not Completed: Patient at procedure or test/unavailable. Second attempt today. Will return tomorrow for assessment.    Laurin Morgenstern 02/26/2020, 1:58 PM

## 2020-02-26 NOTE — Progress Notes (Signed)
BMET per Dr. Mayford Knife

## 2020-02-26 NOTE — Progress Notes (Signed)
   Dr. Mayford Knife requested BMET in 1 week and f/u with Dr. Antoine Poche in 2-3 weeks. Order written and lab instructions/appt info placed on AVS. Patton Swisher PA-C

## 2020-02-26 NOTE — Significant Event (Signed)
Rapid Response Event Note  Overview: Called to bedside d/t SpO2-66% on RA and pt being confused, combative, and refusing to put oxygen back on.  This is RRT's 3rd time responding to this pt for this reason. When she gets hypoxic, she gets confused and combative, and refuses to allow staff members to help her.   Upon arrival, pt SpO2-66% and NRB was laying in bed. Pt was calling someone on the phone, and yelling for everyone to get out of her room. I held her hand and asked her to talk to me. She put her phone down and was able to tell me how frustrated she was. She is tired of being in the hospital and wants to go home. I listened to her. While we were talking, I asked if I could put her oxygen back on. She agreed. Once NRB back on, her SpO2 slowly increased to >90%. She then became less confused and irritable. I told her the risks of having her oxygen levels stay low. I told her that everyone wanted her to be safe and that having oxygen levels that low wasn't safe for her. She seemed to understand this now that her SpO2>90, however, when hypoxic, she gets very confused and doesn't want to be messed with.    [place], NP came to bedside and spoke with her for a few minutes as well.  Plan: 0.5mg  ativan given IV. Continue to monitor pt. Please call RRT if further assistance needed.   [name]

## 2020-02-26 NOTE — Progress Notes (Signed)
PT Cancellation Note  Patient Details Name: Michele Parrish MRN: [id number] DOB: [birth year]   Cancelled Treatment:    Reason Eval/Treat Not Completed: Patient at procedure or test/unavailable. Will re-attempt later if time allows.    Shalicia Craghead 02/26/2020, 12:12 PM

## 2020-02-26 NOTE — Progress Notes (Signed)
2D echo reviewed and showed trivial pericardial effusion posteriorly and normal LVF.  No further recs at this time.  Will sign off.

## 2020-02-27 ENCOUNTER — Encounter (HOSPITAL_COMMUNITY): Payer: Self-pay | Admitting: Internal Medicine

## 2020-02-27 LAB — BASIC METABOLIC PANEL
Anion gap: 11 (ref 5–15)
BUN: 31 mg/dL — ABNORMAL HIGH (ref 6–20)
CO2: 34 mmol/L — ABNORMAL HIGH (ref 22–32)
Calcium: 9 mg/dL (ref 8.9–10.3)
Chloride: 95 mmol/L — ABNORMAL LOW (ref 98–111)
Creatinine, Ser: 1.18 mg/dL — ABNORMAL HIGH (ref 0.44–1.00)
GFR calc Af Amer: >60 mL/min (ref >60)
GFR calc non Af Amer: 53 mL/min — ABNORMAL LOW (ref >60)
Glucose, Bld: 109 mg/dL — ABNORMAL HIGH (ref 70–99)
Potassium: 3.9 mmol/L (ref 3.5–5.1)
Sodium: 140 mmol/L (ref 135–145)

## 2020-02-27 NOTE — Progress Notes (Signed)
PROGRESS NOTE    Michele Parrish  XBJ:[id number] DOB: 09-26-1966 DOA: 02/21/2020 PCP: Loletta Specter, PA-C    Chief Complaint  Patient presents with  . Shortness of Breath    Brief Narrative:  Admission note by Dr Tyson Babinski " Michele Parrish is a 54 y.o. female with past medical history of chronic bronchitis supposed to be on nebulizers but not on it, presented to hospital with complaints of worsening shortness of breath for the last 3 weeks.  Patient stated that has been getting worse recently and was unable to sleep due to dyspnea suggestive of orthopnea. She takes few steps and is very short winded.  She has been noticing swelling of her bilateral breasts mostly on the left and her abdomen girth has increased as well.  She also notices increasing leg swelling bilaterally.  Patient currently smokes. Patient denied any chest pain, sputum production, phlegm production.  No fever, chills or rigor.  No urinary urgency, frequency or dysuria.  Denies any changes in her bowel habits.  Denies sick contacts or recent travel."  ED Course: Patient was noted to be hypoxic saturating 80% on room air by EMS.  In the ED, patient complained of dyspnea and was put on supplemental oxygen at 5 L with saturation around 97%. Patient was then considered for admission to the hospital.   4/30: Diuresed.  Underwent cardiac cath that showed severe pulmonary hypertension. 5/2-5/3: Desaturations.  Agitations at night. Angry and impulsive specially at night. 5/4: Still remains on high flow oxygen.  Nighttime symptoms improving.  Repeat echocardiogram with no significant pericardial effusion.   Assessment & Plan:   Principal Problem:   Acute hypoxemic respiratory failure (HCC) Active Problems:   COPD with chronic bronchitis (HCC)   Acute exacerbation of CHF (congestive heart failure) (HCC)   Hypomagnesemia   AKI (acute kidney injury) (HCC)   Anasarca   Tobacco abuse   Moderate to severe pulmonary  hypertension (HCC)   Pulmonary nodules   Debility   Acute diastolic CHF (congestive heart failure) (HCC)   Pericardial effusion  1. acute hypoxic respiratory failure secondary to acute diastolic heart failure/right heart failure in the setting of severe pulmonary hypertension: Multifactorial. COPD, pulmonary nodules suspect chronic lung disease, rule out sarcoidosis, plan for outpatient follow-up. Severe pulmonary hypertension, pulmonology consulted and they are working up. VQ scan with low risk pulmonary embolism.  Duplex is normal.  Diuresed more than 34 L negative balance.  Leg swelling and chest wall swelling improved.   Gentle diuresis to continue.  Now on oral diuresis. Continue on oxygen to keep saturation more than 88%.  We will continue to wean down.  May need home oxygen on discharge. On bronchodilator therapy.  Followed by pulmonology. Patient is oxygen dependent, she does not want to go home with oxygen and wants to give few more days.  2.  Acute kidney injury Likely secondary to problem #1.  Renal functions fluctuate but fairly stable.. Continue close monitoring.  3.  Hypomagnesemia Replaced.  Adequate.  4.  Multiple pulmonary nodules Questionable etiology.  Will likely need outpatient follow-up with pulmonary with repeat CT scan in about 3 months.  Followed by pulmonology.  May need bronc and biopsy.  5.  Anasarca/hypoalbuminemia: Albumin 2.7. Urine with no protein, probably from chronic illness.  6.  Tobacco abuse Tobacco cessation.  Continue nicotine patch.  Patient is deteremind to quit.  7.  Debility/weakness PT/OT.  8. Agitation, impulsiveness: Improving.  DVT prophylaxis: Lovenox Code Status: Full Family  Communication: Updated patient.  No family at bedside. She denies any next of kin and does not want me to notify any one.  Disposition:   Status is: Inpatient  Dispo: The patient is from: Home              Anticipated d/c is to: Likely home.               Anticipated d/c date is: To be determined.         Currently on active treatment.  On oxygen.        Consultants:   Cardiology: Dr. Antoine PocheHochrein 02/22/2020  Pulmonology  Procedures:   2D echo 02/21/2020  Chest x-ray 02/21/2020  CT angiogram chest 02/21/2020  Right heart cath  02/23/2020  MRI of the heart, right ventricular dilatation.  Duplexes, normal.  Antimicrobials:   None   Subjective:  Seen and examined.  She is anxious about her diagnosis. She will only leave the hospital when she is able to come off the oxygen. I discussed about her clinical diagnosis and mostly chronic problems and irreversibility of pulmonary hypertension.  She was appreciated.  She would continue to work with therapies and ambulation and see if she can come off the oxygen.  No more agitations at night.  Objective: Vitals:   02/27/20 0441 02/27/20 0751 02/27/20 0800 02/27/20 0858  BP: 100/69 100/68    Pulse:  (!) 102    Resp:  (!) 22    Temp: 98 F (36.7 C) 98.1 F (36.7 C) 98.1 F (36.7 C)   TempSrc: Oral     SpO2: 91% 92%  90%  Weight: 65.2 kg     Height:        Intake/Output Summary (Last 24 hours) at 02/27/2020 1152 Last data filed at 02/27/2020 0905 Gross per 24 hour  Intake 520 ml  Output 2350 ml  Net -1830 ml   Filed Weights   02/25/20 0456 02/26/20 0432 02/27/20 0441  Weight: 65 kg 65 kg 65.2 kg    Examination:  Physical Exam  Constitutional: She is oriented to person, place, and time.  Chronically sick looking.  Anxious.  Currently on 7 L high flow nasal cannula.   HENT:  Head: Normocephalic.  Eyes: Pupils are equal, round, and reactive to light.  Cardiovascular: Normal rate and regular rhythm.  Pulmonary/Chest: Breath sounds normal.  No added sounds.  Musculoskeletal:     Cervical back: Neck supple.  Neurological: She is oriented to person, place, and time.  Psychiatric:  Alert and oriented.  Anxious and frustrated.  Both legs trace edema. Her edema on  her chest, dependent edema on her left breast is mostly improved.    Data Reviewed: I have personally reviewed following labs and imaging studies  CBC: Recent Labs  Lab 02/21/20 0829 02/21/20 0839 02/22/20 0549 02/23/20 0536 02/23/20 0918 02/24/20 0321 02/25/20 0448  WBC 7.7  --  6.9 7.5  --  7.8 8.6  NEUTROABS 3.6  --   --  3.5  --   --  4.4  HGB 12.4   < > 11.4* 11.8* 13.9  13.9 11.5* 11.4*  HCT 41.7   < > 38.4 39.2 41.0  41.0 37.8 37.9  MCV 86.5  --  86.1 86.2  --  84.8 83.8  PLT 209  --  184 200  --  191 203   < > = values in this interval not displayed.    Basic Metabolic Panel: Recent Labs  Lab 02/21/20 [id number]  02/21/20 7824 02/22/20 2353 02/22/20 6144 02/23/20 0536 02/23/20 0536 02/23/20 3154 02/24/20 0321 02/25/20 0448 02/26/20 0357 02/27/20 0422  NA 142   < > 140   < > 141  138   < > 142  142 143 141 141 140  K 4.3   < > 4.0   < > 3.7  3.7   < > 3.9  3.8 4.1 4.2 3.9 3.9  CL 115*   < > 110   < > 102  102  --   --  99 98 94* 95*  CO2 22   < > 25   < > 32  29  --   --  31 35* 37* 34*  GLUCOSE 78   < > 92   < > 91  87  --   --  89 91 121* 109*  BUN 19   < > 17   < > 16  16  --   --  18 26* 25* 31*  CREATININE 1.35*   < > 1.08*   < > 1.30*  1.31*  --   --  1.05* 1.24* 1.27* 1.18*  CALCIUM 8.3*   < > 8.0*   < > 8.0*  7.8*  --   --  8.6* 9.1 9.4 9.0  MG 1.8  --  1.6*  --  1.9  --   --  1.9 1.8  --   --   PHOS  --   --  4.2  --   --   --   --   --  4.1  --   --    < > = values in this interval not displayed.    GFR: Estimated Creatinine Clearance: 50 mL/min (A) (by C-G formula based on SCr of 1.18 mg/dL (H)).  Liver Function Tests: Recent Labs  Lab 02/21/20 0829 02/22/20 0549 02/23/20 0536 02/24/20 0321  AST 33 25 21 17   ALT 21 20 18 13   ALKPHOS 99 90 85 70  BILITOT 0.7 0.9 1.1 1.0  PROT 7.3 6.7 6.7 6.8  ALBUMIN 2.9* 2.7* 2.7* 3.5    CBG: No results for input(s): GLUCAP in the last 168 hours.   Recent Results (from the past 240  hour(s))  Respiratory Panel by RT PCR (Flu A&B, Covid) - Nasopharyngeal Swab     Status: None   Collection Time: 02/21/20 10:18 AM   Specimen: Nasopharyngeal Swab  Result Value Ref Range Status   SARS Coronavirus 2 by RT PCR NEGATIVE NEGATIVE Final    Comment: (NOTE) SARS-CoV-2 target nucleic acids are NOT DETECTED. The SARS-CoV-2 RNA is generally detectable in upper respiratoy specimens during the acute phase of infection. The lowest concentration of SARS-CoV-2 viral copies this assay can detect is 131 copies/mL. A negative result does not preclude SARS-Cov-2 infection and should not be used as the sole basis for treatment or other patient management decisions. A negative result may occur with  improper specimen collection/handling, submission of specimen other than nasopharyngeal swab, presence of viral mutation(s) within the areas targeted by this assay, and inadequate number of viral copies (<131 copies/mL). A negative result must be combined with clinical observations, patient history, and epidemiological information. The expected result is Negative. Fact Sheet for Patients:  Fact Sheet for Healthcare Providers:  02/23/20 This test is not yet ap proved or cleared by the https://www.moore.com/ FDA and  has been authorized for detection and/or diagnosis of SARS-CoV-2 by FDA under an Emergency Use Authorization (EUA). This  EUA will remain  in effect (meaning this test can be used) for the duration of the COVID-19 declaration under Section 564(b)(1) of the Act, 21 U.S.C. section 360bbb-3(b)(1), unless the authorization is terminated or revoked sooner.    Influenza A by PCR NEGATIVE NEGATIVE Final   Influenza B by PCR NEGATIVE NEGATIVE Final    Comment: (NOTE) The Xpert Xpress SARS-CoV-2/FLU/RSV assay is intended as an aid in  the diagnosis of influenza from Nasopharyngeal swab specimens and  should not be used as  a sole basis for treatment. Nasal washings and  aspirates are unacceptable for Xpert Xpress SARS-CoV-2/FLU/RSV  testing. Fact Sheet for Patients: https://www.moore.com/ Fact Sheet for Healthcare Providers: https://www.young.biz/ This test is not yet approved or cleared by the [place] FDA and  has been authorized for detection and/or diagnosis of SARS-CoV-2 by  FDA under an Emergency Use Authorization (EUA). This EUA will remain  in effect (meaning this test can be used) for the duration of the  Covid-19 declaration under Section 564(b)(1) of the Act, 21  U.S.C. section 360bbb-3(b)(1), unless the authorization is  terminated or revoked. Performed at [facility], [address], [place], [place] [address]   Urine culture     Status: None   Collection Time: 02/21/20  3:36 PM   Specimen: Urine, Random  Result Value Ref Range Status   Specimen Description   Final    URINE, RANDOM Performed at [facility], [address] [place], [place] [address]    Special Requests   Final    NONE Performed at [facility], [address] [place], [place] [address]    Culture   Final    NO GROWTH Performed at [facility], [address], [place], [place] [address]    Report Status 02/22/2020 FINAL  Final         Radiology Studies: NM Pulmonary Perfusion  Result Date: 02/26/2020 CLINICAL DATA:  Shortness of breath. EXAM: NUCLEAR MEDICINE PERFUSION LUNG SCAN TECHNIQUE: Perfusion images were obtained in multiple projections after intravenous injection of radiopharmaceutical. Patient refused ventilation study. Views: Anterior, posterior, left lateral, right lateral, RPO, LPO, RAO, LAO RADIOPHARMACEUTICALS:  1.53 mCi Tc-35m MAA IV COMPARISON:  Chest radiograph Feb 26, 2020 FINDINGS: Chest radiograph shows underlying emphysematous change with diminished vascularity in the upper lobes. There  is decreased radiotracer uptake throughout the right upper lobe in a nonsegmental distribution which is felt to correspond to the diminished vascularity seen in this area due to apparent underlying emphysematous change on chest radiograph. No segmental appearing perfusion defects are evident. Radiotracer uptake on the left is essentially normal. IMPRESSION: Decreased uptake throughout the right upper lobe in a nonsegmental distribution which is felt to correspond to findings of increased vascularity in this area on chest radiograph. No appreciable segmental or significant subsegmental perfusion defect. This study is considered to represent a low probability of pulmonary embolus although the absence of a ventilation study makes assessment somewhat less than optimal. Electronically Signed   By: Bretta Bang III M.D.   On: 02/26/2020 14:11   DG CHEST PORT 1 VIEW  Result Date: 02/26/2020 CLINICAL DATA:  54 year old female with shortness of breath. EXAM: PORTABLE CHEST 1 VIEW COMPARISON:  Chest radiograph dated 02/25/2020. FINDINGS: Diffuse interstitial coarsening and emphysema. Linear left lung base atelectasis/scarring. No focal consolidation, pleural effusion, pneumothorax. Faint nodular densities over the right mid lung field corresponding to the nodule seen on the prior CT. Mild cardiomegaly. No acute  osseous pathology. IMPRESSION: 1. No acute cardiopulmonary process. No significant interval change. 2. Emphysema. 3. Mild cardiomegaly. Electronically Signed   By: Anner Crete M.D.   On: 02/26/2020 01:11   DG CHEST PORT 1 VIEW  Result Date: 02/25/2020 CLINICAL DATA:  Hypoxia. EXAM: PORTABLE CHEST 1 VIEW COMPARISON:  02/13/2020 and CT 02/21/2020 FINDINGS: Lordotic technique is demonstrated. Lungs are adequately inflated with mild hazy prominence of the perihilar markings suggesting mild vascular congestion. No lobar consolidation or effusion. Few small nodular opacities over the lateral right mid to  lower lung as seen on recent CT. Stable cardiomegaly. Remainder of the exam is unchanged. IMPRESSION: 1.  Stable cardiomegaly with suggestion of mild vascular congestion. 2. Few small nodular opacities over the lateral right mid to lower lung as seen on recent CT. Continue to recommend clinical correlation for underlying malignancy and least follow-up CT 3 months. Electronically Signed   By: Marin Olp M.D.   On: 02/25/2020 14:43   ECHOCARDIOGRAM LIMITED  Result Date: 02/26/2020    ECHOCARDIOGRAM LIMITED REPORT   Patient Name:   ALLETTA MATTOS Date of Exam: 02/26/2020 Medical Rec #:  [id number]        Height:       62.0 in Accession #:    [id number]       Weight:       143.3 lb Date of Birth:  1966-03-20        BSA:          1.659 m Patient Age:    43 years         BP:           92/60 mmHg Patient Gender: F                HR:           94 bpm. Exam Location:  Inpatient Procedure: Limited Echo, Limited Color Doppler and Cardiac Doppler Indications:    Pericardial effusion 423.9 / I31.3  History:        Patient has prior history of Echocardiogram examinations, most                 recent 02/21/2020. Moderate right ventricular heart dysfunction,                 Acute hypoxic respiratory failure, Acute kidney injury.  Sonographer:    Darlina Sicilian RDCS Referring Phys: [phone] TRACI R TURNER  Sonographer Comments: Exam ended early per patients request. IMPRESSIONS  1. Left ventricular ejection fraction, by estimation, is 65 to 70%. The left ventricle has normal function.  2. Right ventricular systolic function is moderately reduced. The right ventricular size is mildly enlarged.  3. The aortic valve is tricuspid. FINDINGS  Left Ventricle: Left ventricular ejection fraction, by estimation, is 65 to 70%. The left ventricle has normal function. Right Ventricle: The right ventricular size is mildly enlarged. Right ventricular systolic function is moderately reduced. Pericardium: Trivial pericardial effusion is present. The  pericardial effusion is posterior to the left ventricle. There is no evidence of cardiac tamponade. Aortic Valve: The aortic valve is tricuspid. Pulmonic Valve: Pulmonic valve regurgitation is trivial. Candee Furbish MD Electronically signed by Candee Furbish MD Signature Date/Time: 02/26/2020/2:58:37 PM    Final         Scheduled Meds: . budesonide (PULMICORT) nebulizer solution  0.5 mg Nebulization BID  . Chlorhexidine Gluconate Cloth  6 each Topical Daily  . docusate sodium  100 mg Oral BID  . enoxaparin (  LOVENOX) injection  40 mg Subcutaneous Q24H  . feeding supplement (ENSURE ENLIVE)  237 mL Oral BID BM  . folic acid  1 mg Oral Daily  . furosemide  40 mg Oral BID  . guaiFENesin  600 mg Oral BID  . multivitamin with minerals  1 tablet Oral Daily  . nicotine  14 mg Transdermal Daily  . sodium chloride flush  3 mL Intravenous Q12H  . thiamine  100 mg Oral Daily   Continuous Infusions: . sodium chloride       LOS: 6 days    Time spent: 30 minutes    Dorcas Carrow, MD [facility]

## 2020-02-27 NOTE — Progress Notes (Signed)
NAME:  Michele Parrish, MRN:  [id number], DOB:  09-26-1966, LOS: 6 ADMISSION DATE:  02/21/2020, CONSULTATION DATE:  02/23/20 REFERRING MD:  Antoine Poche - Cardiology, CHIEF COMPLAINT/Reason for consult:  CC:SOB, RFC: Pulmonary Hypertension  Brief History   54 yo F with chronic bronchitis, emphysematous lung changes admitted for SOB and hypoxia. Underwent RHC 4/30. PCCM consulted for pulmonary hypertension.  History of present illness   54 yo F PMH COPD with chronic bronchitis- non-compliant with OP meds, emphysematous pulmonary changes, who presented to ED with SOB x 3 weeks. SOB is progressive in quality, and has associated orthopnea as well as DOE. Patient also endorses associated edema of breasts, abdominal distention, and BLE. No URI sx or sick contacts. In ED, SpO2 80% on RA, SpO2 improved to 97% on 5LNC.   No previous history of diet pill use. Has done cocaine in the past- most recently about 6 months ago, but no amphetamines, adderall, ritalin, or other illicits. No previous history of malignancy or heart disease. No family history of heart disease, PH, or lung disease, including sarcoidosis. No history of VTE. Occasional snoring, but no witnessed apneas or known OSA. No history of sickle cell anemia. HIV negative in the past. Quit drinking 6 months ago. Quit smoking 1 week ago after 42 years x 1-2ppd.  BNP 986. ECHO with LVEF 60-65%, grade 1 DD. Severely elevated pulmonary artery systolic pressure and estimated RVSP .  Patient evaluated by cardiology and underwent RHC 4/30. PCCM Consulted 4/20 for PH.   Past Medical History  Chronic bronchitis - hasn't used nebulizer in 2 years prior to 3 weeks ago  Significant Hospital Events   4/28 admitted 4/30 RHC  Consults:  Cards PCCM  Procedures:  4/30 RHC   Significant Diagnostic Tests:  02/26/2020 VQ Scan: Chest radiograph shows underlying emphysematous change with diminished vascularity in the upper lobes. There is  decreased radiotracer uptake throughout the right upper lobe in a nonsegmental distribution which is felt to correspond to the diminished vascularity seen in this area due to apparent underlying emphysematous change on chest radiograph. No segmental appearing perfusion defects are evident. Radiotracer uptake on the left is essentially normal.  Decreased uptake throughout the right upper lobe in a nonsegmental distribution which is felt to correspond to findings of increased vascularity in this area on chest radiograph. No appreciable segmental or significant subsegmental perfusion defect. This study is considered to represent a low probability of pulmonary embolus although the absence of a ventilation study makes assessment somewhat less than optimal.   4/28  CTA chest> no PE. Small R pleural effusion, bibasilar atelectasis. Multiple small bilateral pulmonary nodules <1cm, mostly in subpleural location. Possible low-density enlarged mediastinal lymph nodes, bilaterally axillary adenopathy. Mild cardiomegaly. Small pericardial fluid collection.  4/29 ECHO: LVEF 60-65%. Grade I DD. Significant trabeculations in RV apex. Moderately reduced RV systolic function. Severely elevated pulmonary artery systolic pressure. RVSP 64 mmHg.  Moderate TR. RAP estimated   4/30 RHC: Moderately severely elevated right-sided filling pressures with moderate to severe pulmonary hypertension and normal cardiac output RA: 16 mmHg RV: 60 / 11 mmHg Pulmonary capillary wedge pressure: 16 mmHg PA: 66/26 with a mean of 40 mmHg Ao sat 92% PA sat 63% Cardiac output is 4.92 with a cardiac index of 2.86. Pulmonary vascular resistance: 4.9   RUQ [name] 4/30-no evidence of cirrhosis Cardiac MRI 4/30-Marked RVE with evidence of volume and pressure overload, no late gadolinium enhancement of the LV.  Severely reduced RV systolic function,  no RWMA.  Normal LA, dilated RA. Micro Data:  SARS Cov2> neg  HIV negative  02/21/20  Antimicrobials:   None  Interim history/subjective:  Pt. Is asleep in bed on her side. Sats are 83% on 8 L. They rebound to 86-87% with repositioning and deep breathing. Pt. Denies any shortness of breath. She states she is too tired to sit up.  Objective   Blood pressure 100/69, pulse (!) 109, temperature 98 F (36.7 C), temperature source Oral, resp. rate 20, height 5\' 2"  (1.575 m), weight 65.2 kg, SpO2 91 %.        Intake/Output Summary (Last 24 hours) at 02/27/2020 0826 Last data filed at 02/26/2020 1900 Gross per 24 hour  Intake --  Output 2350 ml  Net -2350 ml   Filed Weights   02/25/20 0456 02/26/20 0432 02/27/20 0441  Weight: 65 kg 65 kg 65.2 kg    Examination:   General: sleepy, laying horizontally in bed resting, sats 83% HENT: [place]/AT, eyes anicteric.  Lungs: Bilateral chest excursion,No wheezing or rhales. Breathing comfortably on 8 L [place] saturating 87% after repositioning, no increased WOB , +  tachypnea. Cardiovascular: S1, S2, RRR,No RMG,  brisk cap refill.  Dilated chest wall and forehead/ temple veins. Abdomen: soft, ND, hepatomegaly Extremities: no LE edema. No cyanosis. + clubbing. Neuro: sleepy, answering questions in soft hard to hear voice, MAE x 4, A&O x 3. Derm: no significant bleeding or bruising, no rashes.  CXR 5/3>> -cardiomegaly, ongoing congestive appearance of the lungs.  Right lower lobe nodularity.  Resolved Hospital Problem list     Assessment & Plan:   Acute respiratory failure with hypoxia COPD- emphysematous changes on CT, not using prescribed outpatient medications (previously prescribed Breo) Pulmonary hypertension- possibly WHO group III due to lung disease, but this seems unlikely to fully explain her presentation without long-standing hypoxia, which is usually associated with more significant chronic pulmonary symptoms. Sarcoidosis is possible with pulmonary nodules & adenopathy.  No evidence of cirrhosis on right upper  quadrant ultrasound; hepatomegaly seen on CT and physical exam is likely congestive. PH out of proportion to LV pressures.  Plan -Supplemental oxygen to maintain SPO2 greater than 90%. Recommend good pulmonary hygiene, OOB mobility, IS. -OOB and  in a chair for meals to minimize risk of aspiration. -Diuresis per cardiology's recommendations - See VQ results above -Needs outpatient PFTs and PSG to evaluate for OSA.   - Given the degree of emphysema on her CT scan, it is not unreasonable to start her on Spiriva or Incruse daily at discharge. -Depending on the results of the above studies, may be a candidate for ERA + PDEi, although her BP is unlikely to tolerate initiating these concurrently.  Multiple sub-centimeter pulmonary nodules Axillary lymphadenopathy, mediastinal lymphadenopathy, lymph nodes in the inguinal region- unclear if this is due to vascular congestion.  Sarcoidosis is possible, which could explain PH. Plan -OP follow up for follow up CT in 3-6 months for multiple nodules -Consider  EBUS vs inguinal node biopsy to evaluate for sarcoidosis; needs to be optimized from [facility] standpoint before this is attempted.  Tobacco use disorder 63 pack year smoking history Quit 1 week ago Plan -Continue nicotine patch -smoking cessation counseling previously -Congratulated her on her success  Alcohol use disorder - reports was remote -monitor for signs of withdrawal -supplemental vitamins  BLE edema likely 2/2 acute heart failure. No DVT on [facility]. -volume management per cardiology   Will schedule patient to be seen as outpatient for  hospital follow up in 4 weeks.  She will need follow up CT Chest in 3 months to re-evaluate pulmonary nodules and lymphadenopathy. Tissue sampling to eval for sarcoidosis vs malignancy if/ when  her PH has been optimized. Her ability to handle treatment ( candidate for ERA + PDEi,) will be based in her hemodynamics once discharged,  She does not seem  motivated to get OOB and use her IS.  She is scheduled for follow up Wednesday 6/2 at 11:30 am with Dr. Roena Malady practice:  Diet: reg Pain/Anxiety/Delirium protocol (if indicated): na VAP protocol (if indicated): na DVT prophylaxis: lovenox  GI prophylaxis: na Glucose control: na Mobility: post-RHC  Code Status: Full  Family Communication: primary team  Disposition: cardiac tele   Labs   CBC: Recent Labs  Lab 02/21/20 0829 02/21/20 0839 02/22/20 0549 02/23/20 0536 02/23/20 0918 02/24/20 0321 02/25/20 0448  WBC 7.7  --  6.9 7.5  --  7.8 8.6  NEUTROABS 3.6  --   --  3.5  --   --  4.4  HGB 12.4   < > 11.4* 11.8* 13.9  13.9 11.5* 11.4*  HCT 41.7   < > 38.4 39.2 41.0  41.0 37.8 37.9  MCV 86.5  --  86.1 86.2  --  84.8 83.8  PLT 209  --  184 200  --  191 203   < > = values in this interval not displayed.    Basic Metabolic Panel: Recent Labs  Lab 02/21/20 0829 02/21/20 8416 02/22/20 0549 02/22/20 0549 02/23/20 0536 02/23/20 0536 02/23/20 0918 02/24/20 0321 02/25/20 0448 02/26/20 0357 02/27/20 0422  NA 142   < > 140   < > 141  138   < > 142  142 143 141 141 140  K 4.3   < > 4.0   < > 3.7  3.7   < > 3.9  3.8 4.1 4.2 3.9 3.9  CL 115*   < > 110   < > 102  102  --   --  99 98 94* 95*  CO2 22   < > 25   < > 32  29  --   --  31 35* 37* 34*  GLUCOSE 78   < > 92   < > 91  87  --   --  89 91 121* 109*  BUN 19   < > 17   < > 16  16  --   --  18 26* 25* 31*  CREATININE 1.35*   < > 1.08*   < > 1.30*  1.31*  --   --  1.05* 1.24* 1.27* 1.18*  CALCIUM 8.3*   < > 8.0*   < > 8.0*  7.8*  --   --  8.6* 9.1 9.4 9.0  MG 1.8  --  1.6*  --  1.9  --   --  1.9 1.8  --   --   PHOS  --   --  4.2  --   --   --   --   --  4.1  --   --    < > = values in this interval not displayed.   GFR: Estimated Creatinine Clearance: 50 mL/min (A) (by C-G formula based on SCr of 1.18 mg/dL (H)). Recent Labs  Lab 02/22/20 0549 02/23/20 0536 02/24/20 0321 02/25/20 0448  WBC 6.9 7.5 7.8  8.6    Liver Function Tests: Recent Labs  Lab 02/21/20 0829 02/22/20 0549  02/23/20 0536 02/24/20 0321  AST 33 25 21 17   ALT 21 20 18 13   ALKPHOS 99 90 85 70  BILITOT 0.7 0.9 1.1 1.0  PROT 7.3 6.7 6.7 6.8  ALBUMIN 2.9* 2.7* 2.7* 3.5   No results for input(s): LIPASE, AMYLASE in the last 168 hours. No results for input(s): AMMONIA in the last 168 hours.  ABG    Component Value Date/Time   PHART 7.436 02/26/2020 0120   PCO2ART 56.1 (H) 02/26/2020 0120   PO2ART 57.5 (L) 02/26/2020 0120   HCO3 36.9 (H) 02/26/2020 0120   TCO2 37 (H) 02/23/2020 0918   TCO2 37 (H) 02/23/2020 0918   ACIDBASEDEF 2.3 (H) 09/12/2010 0501   O2SAT 87.4 02/26/2020 0120     Coagulation Profile: Recent Labs  Lab 02/21/20 0829 02/22/20 0549  INR 1.2 1.2    Cardiac Enzymes: No results for input(s): CKTOTAL, CKMB, CKMBINDEX, TROPONINI in the last 168 hours.  HbA1C: Hgb A1c MFr Bld  Date/Time Value Ref Range Status  02/21/2020 01:16 PM 6.4 (H) 4.8 - 5.6 % Final    Comment:    (NOTE) Pre diabetes:          5.7%-6.4% Diabetes:              >6.4% Glycemic control for   <7.0% adults with diabetes     CBG: No results for input(s): GLUCAP in the last 168 hours.  Magdalen Spatz, MSN, AGACNP-BC [place] [facility] Refer to [facility] for pager  After 4 pm please call [phone] 02/27/2020 8:56 AM

## 2020-02-27 NOTE — Evaluation (Signed)
Physical Therapy Evaluation Patient Details Name: Michele Parrish MRN: [id number] DOB: [birth year] Today's Date: 02/27/2020   History of Present Illness  54 y.o. female with past medical history of chronic bronchitis supposed to be on nebulizers, presented to hospital with complaints of worsening shortness of breath for ~ 3 weeks. Patient diagnosed with acute hypoxemic respiratory failure.   Clinical Impression  Patient received in recliner. She is alert, pleasant, agrees to PT session. She is eager to get up and walk. She requires min guard assist for balance with standing and walking due to weakness. She was able to ambulate no AD, min guard 3x 20 feet. Requiring seated rest between each rep due to decreased O2 saturations with mobility on 6 lpm O2 ( saturations drop to mid 80%s with 6 lpm). On last rep she ambulated on room air and saturations dropped to 75%. On 6 liters of O2 she is able to return to > 90% with pursed lip breathing and rest within minutes.  She will continue to benefit from skilled PT while here to improve strength and balance. She is eager to get home to her dog.      Follow Up Recommendations Home health PT    Equipment Recommendations  None recommended by PT    Recommendations for Other Services       Precautions / Restrictions Precautions Precautions: Fall Precaution Comments: monitor O2 Restrictions Weight Bearing Restrictions: No      Mobility  Bed Mobility Overal bed mobility: Independent             General bed mobility comments: performed sit to supine independently  Transfers Overall transfer level: Needs assistance Equipment used: None Transfers: Sit to/from Stand Sit to Stand: Supervision         General transfer comment: S for safety'  Ambulation/Gait Ambulation/Gait assistance: Supervision;Min guard Gait Distance (Feet): 60 Feet Assistive device: None Gait Pattern/deviations: Step-through pattern;Narrow base of support Gait  velocity: decr   General Gait Details: slightly unsteady initially improved with increased activity. Limited in distance by O2 saturations. ambulated 20 feet x 3 reps.  Stairs            Wheelchair Mobility    Modified Rankin (Stroke Patients Only)       Balance Overall balance assessment: Mild deficits observed, not formally tested;Modified Independent                                           Pertinent Vitals/Pain Pain Assessment: No/denies pain    Home Living Family/patient expects to be discharged to:: Private residence Living Arrangements: Alone Available Help at Discharge: Friend(s);Available PRN/intermittently Type of Home: House Home Access: Stairs to enter   Entrance Stairs-Number of Steps: 4 Home Layout: One level Home Equipment: Emergency planning/management officer - 2 wheels;Cane - single point;Wheelchair - manual;Bedside commode Additional Comments: pt states all home equipment was her father's and can get it if needed    Prior Function Level of Independence: Independent         Comments: she does not drive due to vision problems     Hand Dominance   Dominant Hand: Right    Extremity/Trunk Assessment   Upper Extremity Assessment Upper Extremity Assessment: Defer to OT evaluation    Lower Extremity Assessment Lower Extremity Assessment: Generalized weakness    Cervical / Trunk Assessment Cervical / Trunk Assessment: Normal  Communication   Communication:  No difficulties  Cognition   Behavior During Therapy: WFL for tasks assessed/performed Overall Cognitive Status: Within Functional Limits for tasks assessed                                        General Comments      Exercises     Assessment/Plan    PT Assessment Patient needs continued PT services  PT Problem List Decreased strength;Decreased activity tolerance;Cardiopulmonary status limiting activity;Decreased mobility       PT Treatment Interventions  Therapeutic activities;Gait training;Therapeutic exercise;Stair training;Functional mobility training;Balance training;Patient/family education    PT Goals (Current goals can be found in the Care Plan section)  Acute Rehab PT Goals Patient Stated Goal: to go home PT Goal Formulation: With patient Time For Goal Achievement: 03/12/20 Potential to Achieve Goals: Good    Frequency Min 3X/week   Barriers to discharge Decreased caregiver support      Co-evaluation               AM-PAC PT "6 Clicks" Mobility  Outcome Measure Help needed turning from your back to your side while in a flat bed without using bedrails?: None Help needed moving from lying on your back to sitting on the side of a flat bed without using bedrails?: None Help needed moving to and from a bed to a chair (including a wheelchair)?: A Little Help needed standing up from a chair using your arms (e.g., wheelchair or bedside chair)?: A Little Help needed to walk in hospital room?: A Little Help needed climbing 3-5 steps with a railing? : A Little 6 Click Score: 20    End of Session Equipment Utilized During Treatment: Gait belt;Oxygen Activity Tolerance: Patient tolerated treatment well;Patient limited by fatigue Patient left: in bed;with call bell/phone within reach Nurse Communication: Mobility status PT Visit Diagnosis: Unsteadiness on feet (R26.81);Muscle weakness (generalized) (M62.81)    Time: 9390-3009 PT Time Calculation (min) (ACUTE ONLY): 28 min   Charges:   PT Evaluation $PT Eval Moderate Complexity: 1 Mod PT Treatments $Gait Training: 8-22 mins        Sharonda Llamas, PT, GCS 02/27/20,3:15 PM

## 2020-02-27 NOTE — TOC Initial Note (Signed)
Transition of Care [facility]) - Initial/Assessment Note    Patient Details  Name: Michele Parrish MRN: [id number] Date of Birth: 09-24-1966  Transition of [facility]) CM/SW Contact:    Gala Lewandowsky, RN Phone Number: 02/27/2020, 11:09 AM  Clinical Narrative:  Patient presented for shortness of breath. Prior to arrival patient was from home alone. Patient is without insurance and primary care provider. Case Manager received verbal permission to call the [facility] to establish an appointment. Appointment will be placed on the AVS. Patient will be able to initially get medications via [facility] and then use the onsite pharmacy at the [facility]. Patient states she will have transportation to appointments. Patient may be eligible for MATCH once stable to transition home. Case Manager will follow for oxygen and additional transition of care needs.                  Expected Discharge Plan: Home/Self Care Barriers to Discharge: Continued Medical Work up   Patient Goals and CMS Choice Patient states their goals for this hospitalization and ongoing recovery are:: to return home   Choice offered to / list presented to : NA  Expected Discharge Plan and Services Expected Discharge Plan: Home/Self Care In-house Referral: NA Discharge Planning Services: CM Consult Post Acute Care Choice: Durable Medical Equipment(following for oxygen) Living arrangements for the past 2 months: Single Family Home                  HH Agency: NA    Prior Living Arrangements/Services Living arrangements for the past 2 months: Single Family Home Lives with:: Self Patient language and need for interpreter reviewed:: Yes Do you feel safe going back to the place where you live?: Yes      Need for Family Participation in Patient Care: Yes (Comment) Care giver support system in place?: Yes (comment)   Criminal Activity/Legal Involvement Pertinent to Current Situation/Hospitalization:  No - Comment as needed  Activities of Daily Living Home Assistive Devices/Equipment: Nebulizer ADL Screening (condition at time of admission) Patient's cognitive ability adequate to safely complete daily activities?: Yes Is the patient deaf or have difficulty hearing?: No Does the patient have difficulty seeing, even when wearing glasses/contacts?: No Does the patient have difficulty concentrating, remembering, or making decisions?: No Patient able to express need for assistance with ADLs?: Yes Does the patient have difficulty dressing or bathing?: No Independently performs ADLs?: Yes (appropriate for developmental age)(having shortness of breath) Does the patient have difficulty walking or climbing stairs?: Yes(secondary to shortness of breath) Weakness of Legs: Both Weakness of Arms/Hands: None  Permission Sought/Granted Permission sought to share information with : Family Supports     Emotional Assessment Appearance:: Appears stated age Attitude/Demeanor/Rapport: Engaged Affect (typically observed): Blunt Orientation: : Oriented to Self, Oriented to Place, Oriented to  Time, Oriented to Situation Alcohol / Substance Use: Not Applicable Psych Involvement: No (comment)  Admission diagnosis:  Acute hypoxemic respiratory failure (HCC) [J96.01] Patient Active Problem List   Diagnosis Date Noted  . Pericardial effusion   . Acute exacerbation of CHF (congestive heart failure) (HCC) 02/22/2020  . Hypomagnesemia 02/22/2020  . AKI (acute kidney injury) (HCC) 02/22/2020  . Anasarca 02/22/2020  . Tobacco abuse 02/22/2020  . Moderate to severe pulmonary hypertension (HCC) 02/22/2020  . Pulmonary nodules 02/22/2020  . Debility 02/22/2020  . Acute diastolic CHF (congestive heart failure) (HCC) 02/22/2020  . Acute hypoxemic respiratory failure (HCC) 02/21/2020  . COPD with chronic bronchitis (HCC)  02/21/2020  . WEIGHT GAIN, ABNORMAL 11/27/2010  . MALNUTRITION 10/23/2010  . ANEMIA  10/23/2010  . PNEUMONIA 10/23/2010  . COPD 10/23/2010  . RESPIRATORY FAILURE, ACUTE 10/23/2010  . PRESSURE ULCER UNSPECIFIED SITE 10/23/2010  . DELIRIUM 10/23/2010   PCP:  Clent Demark, PA-C Pharmacy:   [id number] - [place], [address] [place] [address] Phone: [phone] Fax: [phone]   Readmission Risk Interventions No flowsheet data found.

## 2020-02-27 NOTE — Progress Notes (Signed)
OT Cancellation Note  Patient Details Name: Michele Parrish MRN: [id number] DOB: June 24, 1966   Cancelled Treatment:    Reason Eval/Treat Not Completed: Patient declined, no reason specified Per RN, patient wanting to sleep till around 10AM this morning and does not want to be disturbed. Will re-attempt therapy as time allows.   Pollyann Glen E. [place] Wert, COTA/L Acute Rehabilitation Services [phone]  Cherlyn Cushing 02/27/2020, 9:15 AM

## 2020-02-28 MED ORDER — SPIRIVA HANDIHALER 18 MCG IN CAPS
18.0000 ug | ORAL_CAPSULE | Freq: Every day | RESPIRATORY_TRACT | 2 refills | Status: DC
Start: 2020-02-28 — End: 2020-03-26

## 2020-02-28 MED ORDER — HYDROCORTISONE 1 % EX CREA
TOPICAL_CREAM | CUTANEOUS | Status: DC | PRN
  Filled 2020-02-28: qty 28

## 2020-02-28 MED ORDER — ALBUTEROL SULFATE HFA 108 (90 BASE) MCG/ACT IN AERS: 2.0000 | INHALATION_SPRAY | RESPIRATORY_TRACT | 12 refills | Status: DC | PRN

## 2020-02-28 MED ORDER — NICOTINE 14 MG/24HR TD PT24: 14.0000 mg | MEDICATED_PATCH | Freq: Every day | TRANSDERMAL | 0 refills | Status: DC

## 2020-02-28 MED ORDER — BREO ELLIPTA 200-25 MCG/INH IN AEPB: 1.0000 | INHALATION_SPRAY | Freq: Every day | RESPIRATORY_TRACT | 5 refills | Status: DC

## 2020-02-28 MED ORDER — FUROSEMIDE 40 MG PO TABS: 40.0000 mg | ORAL_TABLET | Freq: Two times a day (BID) | ORAL | 12 refills | Status: DC

## 2020-02-28 MED FILL — SPIRIVA 18 MCG CP-HANDIHALE: 18 | 30 days supply | Qty: 30 | Fill #0

## 2020-02-28 MED FILL — ALBUTEROL SULFATE HFA 108 (: 108 (90 BAS | 16 days supply | Qty: 18 | Fill #0

## 2020-02-28 MED FILL — FUROSEMIDE 40 MG TABLET: 40 | 30 days supply | Qty: 60 | Fill #0

## 2020-02-28 MED FILL — BREO ELLIPTA 200-25 MCG INH: 200-25 | 30 days supply | Qty: 60 | Fill #0

## 2020-02-28 NOTE — Progress Notes (Signed)
Physical Therapy Treatment Patient Details Name: Michele Parrish MRN: [id number] DOB: 1966/07/09 Today's Date: 02/28/2020    History of Present Illness 54 y.o. female with past medical history of chronic bronchitis supposed to be on nebulizers, presented to hospital with complaints of worsening shortness of breath for ~ 3 weeks. Patient diagnosed with acute hypoxemic respiratory failure.     PT Comments    Pt anxious to get home to her dog and is hopeful for d/c home this afternoon. Pt agreeable to ambulation in hallway. Pt limited in safe mobility by decreased O2 demand, and decreased strength and endurance. Pt is independent with bed mobility and transfers and supervision for ambulation of 200 feet without AD. Pt able to maintain SaO2 >90%O2 on 4L O2 via [place] except for when she was excessively talking. D/c plans remain appropriate.     Follow Up Recommendations  Home health PT     Equipment Recommendations  None recommended by PT       Precautions / Restrictions Precautions Precautions: Fall Precaution Comments: monitor O2 Restrictions Weight Bearing Restrictions: No    Mobility  Bed Mobility Overal bed mobility: Independent             General bed mobility comments: performed sit to supine independently  Transfers Overall transfer level: Needs assistance Equipment used: None Transfers: Sit to/from Stand Sit to Stand: Independent            Ambulation/Gait Ambulation/Gait assistance: Supervision;Min guard Gait Distance (Feet): 200 Feet Assistive device: None Gait Pattern/deviations: Step-through pattern;Narrow base of support Gait velocity: decr Gait velocity interpretation: 1.31 - 2.62 ft/sec, indicative of limited community ambulator General Gait Details: Supervision over all, one misstep at begining of ambulation but pt self corrected without A         Balance Overall balance assessment: Mild deficits observed, not formally tested;Modified  Independent                                          Cognition   Behavior During Therapy: WFL for tasks assessed/performed Overall Cognitive Status: Within Functional Limits for tasks assessed                                           General Comments General comments (skin integrity, edema, etc.): Pt on 4L O2 via[place] for ambulation, and able to maintain SaO2 >90% except when pt with excessive conversation. Pt cued for purse lip breathing and less talking and SaO2 rebounded to >90%O2      Pertinent Vitals/Pain Pain Assessment: No/denies pain           PT Goals (current goals can now be found in the care plan section) Acute Rehab PT Goals PT Goal Formulation: With patient Time For Goal Achievement: 03/12/20 Potential to Achieve Goals: Good Progress towards PT goals: Progressing toward goals    Frequency    Min 3X/week      PT Plan Current plan remains appropriate       AM-PAC PT "6 Clicks" Mobility   Outcome Measure  Help needed turning from your back to your side while in a flat bed without using bedrails?: None Help needed moving from lying on your back to sitting on the side of a flat bed without using bedrails?: None Help needed  moving to and from a bed to a chair (including a wheelchair)?: A Little Help needed standing up from a chair using your arms (e.g., wheelchair or bedside chair)?: A Little Help needed to walk in hospital room?: A Little Help needed climbing 3-5 steps with a railing? : A Little 6 Click Score: 20    End of Session Equipment Utilized During Treatment: Gait belt;Oxygen Activity Tolerance: Patient tolerated treatment well;Patient limited by fatigue Patient left: in bed;with call bell/phone within reach Nurse Communication: Mobility status PT Visit Diagnosis: Unsteadiness on feet (R26.81);Muscle weakness (generalized) (M62.81)     Time: 8097-0449 PT Time Calculation (min) (ACUTE ONLY): 34  min  Charges:  $Gait Training: 23-37 mins                     Roarke Marciano B. Beverely Risen PT, DPT Acute Rehabilitation Services Pager [phone] Office [phone]   Elon Alas [facility] 02/28/2020, 5:33 PM

## 2020-02-28 NOTE — TOC Transition Note (Addendum)
Transition of Care [facility]) - CM/SW Discharge Note   Patient Details  Name: Michele Parrish MRN: [id number] Date of Birth: 1966/06/14  [facility] of [facility]) CM/SW Contact:  Gala Lewandowsky, RN Phone Number: 02/28/2020, 3:55 PM   Clinical Narrative: Patient was asked by the physician to speak with the patient regarding home oxygen. Case Manager is working on trying to get patient oxygen for home. Patient is without insurance and she will have to secure a letter of guarantee LOG for oxygen. Financial counselor has seen the patient and a Medicaid application has been secured. Case Manager is in the process of reaching out to the [facility] Director to see if the LOG can be approved. Awaiting call back. Patient does not need home health services at this time. Case Manager has reached out to [employer] and they are waiting to see if the LOG is approved. Medications to be sent to [facility] to be delivered to the bedside. Case Manager will complete MATCH if needed.   1651 02-28-20 Case Manager reached out to [employer]-if no LOG they will discuss charity with patient. Patient has oxygen for travel home. [employer] release of liability form signed and they will pick the patient up at 1730. Patient's medications were delivered to the room. No further needs from Case Manager at this time.    Final next level of care: Home/Self Care Barriers to Discharge: No Barriers Identified   Patient Goals and CMS Choice Patient states their goals for this hospitalization and ongoing recovery are:: to return home   Choice offered to / list presented to : NA   Discharge Plan and Services In-house Referral: NA Discharge Planning Services: CM Consult Post Acute Care Choice: Durable Medical Equipment(following for oxygen)          DME Arranged: Oxygen DME Agency: [employer] Date DME Agency Contacted: 02/28/20 Time DME Agency Contacted: [phone] Representative spoke with at DME Agency: Ian Malkin HH Arranged: NA HH Agency: NA      Readmission Risk Interventions No flowsheet data found.

## 2020-02-28 NOTE — Progress Notes (Signed)
PROGRESS NOTE    Michele Parrish  HFW:[id number] DOB: Oct 16, 1966 DOA: 02/21/2020 PCP: Loletta Specter, PA-C      Brief Narrative:  Michele Parrish a 54 y.o.femalewith past medical history of chronic bronchitis supposed to be on nebulizers but not on it, presented to hospital with complaints of worsening shortness of breath for the last 3 weeks. Patient stated that has been getting worse recently and was unable to sleep due to dyspnea suggestive of orthopnea.She takes few steps and is very short winded. She has been noticing swelling of her bilateral breasts mostly on the left and her abdomen girth has increased as well. She also notices increasing leg swelling bilaterally. Patient currently smokes. Patient denied any chest pain, sputum production, phlegm production. No fever, chills or rigor. No urinary urgency, frequency or dysuria. Denies any changes in her bowel habits. Denies sick contacts or recent travel."  ED Course:Patient was noted to be hypoxic saturating 80% on room air by EMS. In the ED, patientcomplained of dyspnea and was put on supplemental oxygen at 5 L with saturation around 97%. Patient was then considered for admission to the hospital.   4/30: Diuresed.  Underwent cardiac cath that showed severe pulmonary hypertension. 5/2-5/3: Desaturations.  Agitations at night. Angry and impulsive specially at night. 5/4: Still remains on high flow oxygen.  Nighttime symptoms improving.  Repeat echocardiogram with no significant pericardial effusion.      Assessment & Plan:  Acute hypoxic respiratory failure due to right heart failure in the setting of severe pulmonary hypertension and severe emphysema -Continue budesonide -Continue Lasix -Wean oxygen as able  Multiple pulmonary nodules -Follow-up with pulmonology  Hypoalbuminemia -Continue feeding supplement  Smoking Smoking cessation encouraged      Disposition: Status is inpatient Patient is  appropriate to remain inpatient due to severity of illness, still requiring 4 to 6 L of oxygen.  Patient's discharge disposition is likely to home, she comes from home.  Likely in 1 day.  The patient is not medically ready for discharge.           MDM: The below labs and imaging reports were reviewed and summarized above.  Medication management as above.   DVT prophylaxis: Code Status:  Family Communication:     Consultants:     Procedures:     Antimicrobials:      Culture data:              Subjective: No dyspnea, chest pain, palpitations  Objective: Vitals:   02/27/20 2357 02/28/20 0408 02/28/20 0824 02/28/20 1203  BP: 100/64 104/66 (!) 98/58 98/62  Pulse: 100 95 87 98  Resp: 20 20 20 20   Temp: 98.2 F (36.8 C) 97.9 F (36.6 C) 97.6 F (36.4 C) 98.3 F (36.8 C)  TempSrc: Oral Oral Oral Oral  SpO2: 92% 90% 95% 93%  Weight:  53.3 kg    Height:        Intake/Output Summary (Last 24 hours) at 02/28/2020 2000 Last data filed at 02/28/2020 0800 Gross per 24 hour  Intake 360 ml  Output 150 ml  Net 210 ml   Filed Weights   02/26/20 0432 02/27/20 0441 02/28/20 0408  Weight: 65 kg 65.2 kg 53.3 kg    Examination: General appearance:  adult female, alert and in no acute distress.   HEENT: Anicteric, conjunctiva pink, lids and lashes normal. No nasal deformity, discharge, epistaxis.  Lips moist.   Skin: Warm and dry.   jaundice.  No suspicious rashes  or lesions. Cardiac: RRR, nl S1-S2, no murmurs appreciated.  Capillary refill is brisk.  JVPnot visible.  No LE edema.  Radial pulses 2+ and symmetric. Respiratory: Increased respiratory rate and rhythm.  CTAB without rales or wheezes. Abdomen: Abdomen soft.  No TTP guarding. No ascites, distension, hepatosplenomegaly.   MSK: No deformities or effusions. Neuro: Awake and alert.  EOMI, moves all extremities. Speech fluent.    Psych: Sensorium intact and responding to questions, attention normal. Affect  normal.  Judgment and insight appear normal.    Data Reviewed: I have personally reviewed following labs and imaging studies:  CBC: Recent Labs  Lab 02/22/20 0549 02/23/20 0536 02/23/20 0918 02/24/20 0321 02/25/20 0448  WBC 6.9 7.5  --  7.8 8.6  NEUTROABS  --  3.5  --   --  4.4  HGB 11.4* 11.8* 13.9  13.9 11.5* 11.4*  HCT 38.4 39.2 41.0  41.0 37.8 37.9  MCV 86.1 86.2  --  84.8 83.8  PLT 184 200  --  191 202   Basic Metabolic Panel: Recent Labs  Lab 02/22/20 0549 02/22/20 0549 02/23/20 0536 02/23/20 0536 02/23/20 0918 02/24/20 0321 02/25/20 0448 02/26/20 0357 02/27/20 0422  NA 140   < > 141  138   < > 142  142 143 141 141 140  K 4.0   < > 3.7  3.7   < > 3.9  3.8 4.1 4.2 3.9 3.9  CL 110   < > 102  102  --   --  99 98 94* 95*  CO2 25   < > 32  29  --   --  31 35* 37* 34*  GLUCOSE 92   < > 91  87  --   --  89 91 121* 109*  BUN 17   < > 16  16  --   --  18 26* 25* 31*  CREATININE 1.08*   < > 1.30*  1.31*  --   --  1.05* 1.24* 1.27* 1.18*  CALCIUM 8.0*   < > 8.0*  7.8*  --   --  8.6* 9.1 9.4 9.0  MG 1.6*  --  1.9  --   --  1.9 1.8  --   --   PHOS 4.2  --   --   --   --   --  4.1  --   --    < > = values in this interval not displayed.   GFR: Estimated Creatinine Clearance: 44.6 mL/min (A) (by C-G formula based on SCr of 1.18 mg/dL (H)). Liver Function Tests: Recent Labs  Lab 02/22/20 0549 02/23/20 0536 02/24/20 0321  AST 25 21 17   ALT 20 18 13   ALKPHOS 90 85 70  BILITOT 0.9 1.1 1.0  PROT 6.7 6.7 6.8  ALBUMIN 2.7* 2.7* 3.5   No results for input(s): LIPASE, AMYLASE in the last 168 hours. No results for input(s): AMMONIA in the last 168 hours. Coagulation Profile: Recent Labs  Lab 02/22/20 0549  INR 1.2   Cardiac Enzymes: No results for input(s): CKTOTAL, CKMB, CKMBINDEX, TROPONINI in the last 168 hours. BNP (last 3 results) No results for input(s): PROBNP in the last 8760 hours. HbA1C: No results for input(s): HGBA1C in the last 72  hours. CBG: No results for input(s): GLUCAP in the last 168 hours. Lipid Profile: No results for input(s): CHOL, HDL, LDLCALC, TRIG, CHOLHDL, LDLDIRECT in the last 72 hours. Thyroid Function Tests: No results for input(s): TSH, T4TOTAL, FREET4,  T3FREE, THYROIDAB in the last 72 hours. Anemia Panel: No results for input(s): VITAMINB12, FOLATE, FERRITIN, TIBC, IRON, RETICCTPCT in the last 72 hours. Urine analysis:    Component Value Date/Time   COLORURINE STRAW (A) 02/25/2020 1415   APPEARANCEUR CLEAR 02/25/2020 1415   LABSPEC 1.005 02/25/2020 1415   PHURINE 9.0 (H) 02/25/2020 1415   GLUCOSEU NEGATIVE 02/25/2020 1415   HGBUR NEGATIVE 02/25/2020 1415   BILIRUBINUR NEGATIVE 02/25/2020 1415   KETONESUR NEGATIVE 02/25/2020 1415   PROTEINUR NEGATIVE 02/25/2020 1415   UROBILINOGEN 1.0 10/06/2010 0637   NITRITE NEGATIVE 02/25/2020 1415   LEUKOCYTESUR NEGATIVE 02/25/2020 1415   Sepsis Labs: @LABRCNTIP (procalcitonin:4,lacticacidven:4)  ) Recent Results (from the past 240 hour(s))  Respiratory Panel by RT PCR (Flu A&B, Covid) - Nasopharyngeal Swab     Status: None   Collection Time: 02/21/20 10:18 AM   Specimen: Nasopharyngeal Swab  Result Value Ref Range Status   SARS Coronavirus 2 by RT PCR NEGATIVE NEGATIVE Final    Comment: (NOTE) SARS-CoV-2 target nucleic acids are NOT DETECTED. The SARS-CoV-2 RNA is generally detectable in upper respiratoy specimens during the acute phase of infection. The lowest concentration of SARS-CoV-2 viral copies this assay can detect is 131 copies/mL. A negative result does not preclude SARS-Cov-2 infection and should not be used as the sole basis for treatment or other patient management decisions. A negative result may occur with  improper specimen collection/handling, submission of specimen other than nasopharyngeal swab, presence of viral mutation(s) within the areas targeted by this assay, and inadequate number of viral copies (<131 copies/mL). A  negative result must be combined with clinical observations, patient history, and epidemiological information. The expected result is Negative. Fact Sheet for Patients:  02/23/20 Fact Sheet for Healthcare Providers:  https://www.moore.com/ This test is not yet ap proved or cleared by the https://www.young.biz/ FDA and  has been authorized for detection and/or diagnosis of SARS-CoV-2 by FDA under an Emergency Use Authorization (EUA). This EUA will remain  in effect (meaning this test can be used) for the duration of the COVID-19 declaration under Section 564(b)(1) of the Act, 21 U.S.C. section 360bbb-3(b)(1), unless the authorization is terminated or revoked sooner.    Influenza A by PCR NEGATIVE NEGATIVE Final   Influenza B by PCR NEGATIVE NEGATIVE Final    Comment: (NOTE) The Xpert Xpress SARS-CoV-2/FLU/RSV assay is intended as an aid in  the diagnosis of influenza from Nasopharyngeal swab specimens and  should not be used as a sole basis for treatment. Nasal washings and  aspirates are unacceptable for Xpert Xpress SARS-CoV-2/FLU/RSV  testing. Fact Sheet for Patients: [place] Fact Sheet for Healthcare Providers: https://www.moore.com/ This test is not yet approved or cleared by the https://www.young.biz/ FDA and  has been authorized for detection and/or diagnosis of SARS-CoV-2 by  FDA under an Emergency Use Authorization (EUA). This EUA will remain  in effect (meaning this test can be used) for the duration of the  Covid-19 declaration under Section 564(b)(1) of the Act, 21  U.S.C. section 360bbb-3(b)(1), unless the authorization is  terminated or revoked. Performed at [facility], [address], [place], [place]   Urine culture     Status: None   Collection Time: 02/21/20  3:36 PM   Specimen: Urine, Random  Result Value Ref Range Status   Specimen  Description   Final    URINE, RANDOM Performed at [facility], [address], [place], [place]    Special Requests  Final    NONE Performed at [facility], [address], [place], [place] [address]    Culture   Final    NO GROWTH Performed at [facility], [address], [place], [place] [address]    Report Status 02/22/2020 FINAL  Final         Radiology Studies: No results found.      Scheduled Meds: . budesonide (PULMICORT) nebulizer solution  0.5 mg Nebulization BID  . docusate sodium  100 mg Oral BID  . enoxaparin (LOVENOX) injection  40 mg Subcutaneous Q24H  . feeding supplement (ENSURE ENLIVE)  237 mL Oral BID BM  . folic acid  1 mg Oral Daily  . furosemide  40 mg Oral BID  . guaiFENesin  600 mg Oral BID  . multivitamin with minerals  1 tablet Oral Daily  . nicotine  14 mg Transdermal Daily  . sodium chloride flush  3 mL Intravenous Q12H  . thiamine  100 mg Oral Daily   Continuous Infusions: . sodium chloride       LOS: 7 days    Time spent: 25 minutes    Alberteen Sam, MD [facility] 02/28/2020, 8:00 PM     Please page though AMION or Epic secure chat:  For [employer], Higher education careers adviser

## 2020-02-28 NOTE — Discharge Summary (Signed)
Physician Discharge Summary  Michele Parrish ZOX:[id number] DOB: 1966/07/27 DOA: 02/21/2020  PCP: Loletta Specter, PA-C  Admit date: 02/21/2020 Discharge date: 02/28/2020  Admitted From: Home  Disposition:  Home with O2   Recommendations for Outpatient Follow-up:  1. Follow up with Pulmonology 2. Follow up with Cardiology 3. Follow up with new PCP Dr. Delford Field 4. Dr. Delford Field please follow-up pulmonary nodules 5. Outpatient sleep study per pulmonology   Home Health: None  Equipment/Devices: O2  Discharge Condition: Fair  CODE STATUS: FULL Diet recommendation: Regular  Brief/Interim Summary: Michele Parrish is a 54 y.o. F with hx chronic bronchitis, poor medical follow up, smoking who presented with 2 weeks progressive shortness of breath, orthopnea, increased abdominal girth, leg swelling.  In the ER she was saturating 80% on room air.  Started on IV Lasix and admitted.     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  Echocardiogram showed normal EF, grade 1 diastolic dysfunction, but right ventricular dysfunction.  Cardiology was called to see the patient, who underwent right heart cath which showed severe pulmonary hypertension.  Pulmonology were consulted. They noted severe COPD, pulmonary nodule suggestive of sarcoidosis, and severe pulmonary hypertension.    The patient gradually was weaned down from high flow nasal cannula to 4 L supplemental oxygen.  She was diuresed 36 liters on admission.  She has follow-up with pulmonology, who recommended bronchodilators, supplemental oxygen 24/7.    CKD stage II Creatinine stabilized around 1.1  Multiple pulmonary nodules -Repeat CT scan in 3 months -Follow-up with pulmonology  Moderate protein calorie malnutrition As  evidenced by weight loss, BMI 21, and albumin 2.7.  Smoking Smoking cessation recommended, modalities discussed      Discharge Instructions  Discharge Instructions    (HEART FAILURE PATIENTS) Call MD:  Anytime you have any of the following symptoms: 1) 3 pound weight gain in 24 hours or 5 pounds in 1 week 2) shortness of breath, with or without a dry hacking cough 3) swelling in the hands, feet or stomach 4) if you have to sleep on extra pillows at night in order to breathe.   Complete by: As directed    Diet - low sodium heart healthy   Complete by: As directed    Discharge instructions   Complete by: As directed    From Dr. Maryfrances Bunnell: You were admitted for congestive heart failure caused by your emphysema.  Basically, your lung disease is so harsh, that the stress it caused on your heart caused the hear to fail.  For your heart: Take furosemide/Lasix 40 mg twice daily (at 7 or 8am and in mid afternoon, like 3 or 4pm) Weigh yourself every day in the morning in your pajamas If your weight is ever MORE than 5 lbs over your weight today when you get home, call your heart doctor  Call the heart doctor (Dr. Antoine Poche, see below in To Do section "[facility]") for any assistance before your appointment on May 10 and May 25  Follow up with the lung doctor, Dr. Chestine Spore on June 2   Follow up with Dr. Delford Field, the primary care on May 17    For your lungs:  Take Breo daily Take Spiriva daily  Stay off cigarettes!  This is awesome.   Increase activity slowly   Complete by: As  directed      Allergies as of 02/28/2020   No Known Allergies     Medication List    STOP taking these medications   ibuprofen 400 MG tablet Commonly known as: ADVIL     TAKE these medications   albuterol 108 (90 Base) MCG/ACT inhaler Commonly known as: VENTOLIN HFA Inhale 2 puffs into the lungs every 4 (four) hours as needed for wheezing or shortness of breath.   Breo Ellipta 200-25  MCG/INH Aepb Generic drug: fluticasone furoate-vilanterol Inhale 1 puff into the lungs daily.   furosemide 40 MG tablet Commonly known as: LASIX Take 1 tablet (40 mg total) by mouth 2 (two) times daily.   ipratropium-albuterol 0.5-2.5 (3) MG/3ML Soln Commonly known as: DUONEB Take 3 mLs by nebulization every 6 (six) hours as needed.   nicotine 14 mg/24hr patch Commonly known as: NICODERM CQ - dosed in mg/24 hours Place 1 patch (14 mg total) onto the skin daily. Start taking on: Feb 29, 2020   Spiriva HandiHaler 18 MCG inhalation capsule Generic drug: tiotropium Place 1 capsule (18 mcg total) into inhaler and inhale daily.            Durable Medical Equipment  (From admission, onward)         Start     Ordered   02/28/20 1348  DME Oxygen  (Discharge Planning)  Once    Question Answer Comment  Length of Need Lifetime   Mode or (Route) Nasal cannula   Liters per Minute 4   Oxygen conserving device Yes   Oxygen delivery system Gas      02/28/20 1347         Follow-up Information    [facility] Follow up.   Specialty: Cardiology Why: [facility] - please come to this office address on 03/04/20 for labwork only (BMET) between 8am-4pm. You will also have a follow-up appointment on 03/19/20 with Dr. Antoine Poche at 2:40pm as listed below. Please arrive 15 minutes early to check in. Contact information: [address] [place] [address] [phone]       Steffanie Dunn, DO Follow up on 03/27/2020.   Specialty: Pulmonary Disease Why: 11:30 am with Dr. Elias Else information: [address] [place] [address] [phone]        Storm Frisk, MD Follow up on 03/11/2020.   Specialty: Pulmonary Disease Why: @ 9:00 am for hospital follow up appointment and establish new primary care provider. Onsite pharmacy- medication cost ranges from $4.00-$10.00 Contact information: [address] [place]  [address] [phone]          No Known Allergies  Consultations:  Pulmonology  Cardiology   Procedures/Studies: CT Angio Chest PE W and/or Wo Contrast  Result Date: 02/21/2020 CLINICAL DATA:  Shortness of breath with hypoxia. Left lower extremity swelling. Symptoms 2-3 weeks. EXAM: CT ANGIOGRAPHY CHEST WITH CONTRAST TECHNIQUE: Multidetector CT imaging of the chest was performed using the standard protocol during bolus administration of intravenous contrast. Multiplanar CT image reconstructions and MIPs were obtained to evaluate the vascular anatomy. CONTRAST:  80mL OMNIPAQUE IOHEXOL 350 MG/ML SOLN COMPARISON:  09/11/2010 FINDINGS: Cardiovascular: There is mild to moderate cardiomegaly. Small amount of pericardial fluid is present. Mild prominence of the main pulmonary artery segment. No evidence of pulmonary emboli. Thoracic aorta is normal in caliber. Mediastinum/Nodes: Possible 1.4 cm AP window lymph node and 1.1 cm precarinal lymph node. No significant hilar adenopathy. Remaining mediastinal structures are  unremarkable. Lungs/Pleura: Examination demonstrates centrilobular emphysematous disease. Linear density over the right base likely atelectasis. Small right pleural effusion is present. Mild linear atelectasis/scarring over the right middle lobe. There are several bilateral subcentimeter pulmonary nodules most subpleural in location with the largest over the lateral right lower lobe measuring 8 mm in greatest diameter (image 101 series 7). Airways are normal. Upper Abdomen: No acute findings. Musculoskeletal: Diffuse subcutaneous edema over the chest. Suggestion of mild bilateral symmetric axillary adenopathy. No focal bony abnormality. Bone island over the lower thoracic spine unchanged. Review of the MIP images confirms the above findings. IMPRESSION: 1.  No evidence of pulmonary embolism. 2.  Small right pleural effusion with bibasilar atelectasis. 3. Multiple small bilateral subcentimeter  pulmonary nodules mostly in subpleural location with the largest measuring 8 mm in greatest dimension over the lateral right lower lobe. Couple possible low-density slightly enlarged mediastinal lymph nodes as well as mild symmetric bilateral axillary adenopathy. Findings may be seen with metastatic disease as recommend clinical correlation and at least follow-up chest CT 3 months. 4. Mild-to-moderate cardiomegaly with small amount of pericardial fluid. Diffuse subcutaneous edema. Electronically Signed   By: Elberta Fortisaniel  Boyle M.D.   On: 02/21/2020 10:54   NM Pulmonary Perfusion  Result Date: 02/26/2020 CLINICAL DATA:  Shortness of breath. EXAM: NUCLEAR MEDICINE PERFUSION LUNG SCAN TECHNIQUE: Perfusion images were obtained in multiple projections after intravenous injection of radiopharmaceutical. Patient refused ventilation study. Views: Anterior, posterior, left lateral, right lateral, RPO, LPO, RAO, LAO RADIOPHARMACEUTICALS:  1.53 mCi Tc-8636m MAA IV COMPARISON:  Chest radiograph Feb 26, 2020 FINDINGS: Chest radiograph shows underlying emphysematous change with diminished vascularity in the upper lobes. There is decreased radiotracer uptake throughout the right upper lobe in a nonsegmental distribution which is felt to correspond to the diminished vascularity seen in this area due to apparent underlying emphysematous change on chest radiograph. No segmental appearing perfusion defects are evident. Radiotracer uptake on the left is essentially normal. IMPRESSION: Decreased uptake throughout the right upper lobe in a nonsegmental distribution which is felt to correspond to findings of increased vascularity in this area on chest radiograph. No appreciable segmental or significant subsegmental perfusion defect. This study is considered to represent a low probability of pulmonary embolus although the absence of a ventilation study makes assessment somewhat less than optimal. Electronically Signed   By: Bretta BangWilliam  Woodruff  III M.D.   On: 02/26/2020 14:11   CARDIAC CATHETERIZATION  Result Date: 02/23/2020 Right heart catheterization was done via the right antecubital vein without complication. Moderately severely elevated right-sided filling pressures with moderate to severe pulmonary hypertension and normal cardiac output RA: 16 mmHg RV: 60 / 11 mmHg Pulmonary capillary wedge pressure: 16 mmHg PA: 66/26 with a mean of 40 mmHg Ao sat 92% PA sat 63% Cardiac output is 4.92 with a cardiac index of 2.86. Pulmonary vascular resistance: 4.9 Wood units Recommendations: Pulmonary hypertension seems to be mixed venous and arterial but mostly arterial given that wedge pressure is only 16.  Avoid overdiuresis. I switched furosemide to oral. Continue evaluation of pulmonary hypertension and RV dysfunction.  DG CHEST PORT 1 VIEW  Result Date: 02/26/2020 CLINICAL DATA:  54 year old female with shortness of breath. EXAM: PORTABLE CHEST 1 VIEW COMPARISON:  Chest radiograph dated 02/25/2020. FINDINGS: Diffuse interstitial coarsening and emphysema. Linear left lung base atelectasis/scarring. No focal consolidation, pleural effusion, pneumothorax. Faint nodular densities over the right mid lung field corresponding to the nodule seen on the prior CT. Mild cardiomegaly. No acute osseous pathology. IMPRESSION:  1. No acute cardiopulmonary process. No significant interval change. 2. Emphysema. 3. Mild cardiomegaly. Electronically Signed   By: Elgie Collard M.D.   On: 02/26/2020 01:11   DG CHEST PORT 1 VIEW  Result Date: 02/25/2020 CLINICAL DATA:  Hypoxia. EXAM: PORTABLE CHEST 1 VIEW COMPARISON:  02/13/2020 and CT 02/21/2020 FINDINGS: Lordotic technique is demonstrated. Lungs are adequately inflated with mild hazy prominence of the perihilar markings suggesting mild vascular congestion. No lobar consolidation or effusion. Few small nodular opacities over the lateral right mid to lower lung as seen on recent CT. Stable cardiomegaly. Remainder of  the exam is unchanged. IMPRESSION: 1.  Stable cardiomegaly with suggestion of mild vascular congestion. 2. Few small nodular opacities over the lateral right mid to lower lung as seen on recent CT. Continue to recommend clinical correlation for underlying malignancy and least follow-up CT 3 months. Electronically Signed   By: Elberta Fortis M.D.   On: 02/25/2020 14:43   DG Chest Portable 1 View  Result Date: 02/21/2020 CLINICAL DATA:  Shortness of breath. EXAM: PORTABLE CHEST 1 VIEW COMPARISON:  November 06, 2010. FINDINGS: Stable cardiomegaly. No pneumothorax or pleural effusion is noted. Both lungs are clear. The visualized skeletal structures are unremarkable. IMPRESSION: No active disease. Electronically Signed   By: Lupita Raider M.D.   On: 02/21/2020 08:45   MR CARDIAC MORPHOLOGY W WO CONTRAST  Result Date: 02/23/2020 CLINICAL DATA:  54 year old female with newly diagnosed right sided CHF and severe pulmonary hypertension. EXAM: CARDIAC MRI TECHNIQUE: The patient was scanned on a 1.5 Tesla GE magnet. A dedicated cardiac coil was used. Functional imaging was done using Fiesta sequences. 2,3, and 4 chamber views were done to assess for RWMA's. Modified Simpson's rule using a short axis stack was used to calculate an ejection fraction on a dedicated work [employer]. The patient received 8 cc of Gadavist. After 10 minutes inversion recovery sequences were used to assess for infiltration and scar tissue. CONTRAST:  8 cc  of Gadavist FINDINGS: 1. Small underfilling left ventricle with normal thickness and hyperdynamic systolic function (LVEF = 66%). There are no regional wall motion abnormalities. D-shaped interventricular septum in systole and diastole is consistent with RV pressure and volume overload. There is no late gadolinium enhancement in the left ventricular myocardium. LVEDD: 42 mm LVESD: 25 mm LVEDV: 74 ml LVESV: 25 ml SV: 49 ml CO: 5.1 L/min Myocardial mass: 98 g 2. Severely  dilated right ventricular size, with moderate right ventricular hypertrophy and severely decreased systolic function (LVEF = 24%). There are no regional wall motion abnormalities. 3.  Normal left atrial size.  Moderately dilated right atrium. 4. Normal size of the aortic root, ascending aorta. Severely dilated pulmonary artery measuring 45 mm consistent with pulmonary hypertension. 5.  Trivial mitral and moderate tricuspid regurgitation. 6. Normal pericardium. Mild to moderate circumferential pericardial effusion with maximum diameter 12 mm. Dilated IVC measuring 23 mm and no collapse. IMPRESSION: Study interpretation affected by patient's inability to hold her breath and motion during the exam. 1. Small underfilling left ventricle with normal thickness and hyperdynamic systolic function (LVEF = 66%). There are no regional wall motion abnormalities. D-shaped interventricular septum in systole and diastole is consistent with RV pressure and volume overload. There is no late gadolinium enhancement in the left ventricular myocardium. 2. Severely dilated right ventricular size, with moderate right ventricular hypertrophy and severely decreased systolic function (LVEF = 24%). There are no regional wall motion abnormalities. 3.  Normal left atrial  size.  Moderately dilated right atrium. 4. Normal size of the aortic root, ascending aorta. Severely dilated pulmonary artery measuring 45 mm consistent with pulmonary hypertension. 5.  Trivial mitral and moderate tricuspid regurgitation. 6. Normal pericardium. Mild to moderate circumferential pericardial effusion with maximum diameter 12 mm. Dilated IVC measuring 23 mm and no collapse. These findings are consistent with isolated severe right ventricular failure leading to left sided underfilling and moderate circumferential pericardial effusion. Electronically Signed   By: Tobias Alexander   On: 02/23/2020 19:52   ECHOCARDIOGRAM COMPLETE  Result Date: 02/21/2020     ECHOCARDIOGRAM REPORT   Patient Name:   Michele Parrish Date of Exam: 02/21/2020 Medical Rec #:  [id number]        Height:       63.0 in Accession #:    [id number]       Weight:       143.3 lb Date of Birth:  [birth year]        BSA:          1.678 m Patient Age:    53 years         BP:           123/91 mmHg Patient Gender: F                HR:           97 bpm. Exam Location:  Inpatient Procedure: 2D Echo, Color Doppler and Cardiac Doppler Indications:    I50.31 Acute diastolic (congestive) heart failure  History:        Patient has no prior history of Echocardiogram examinations.                 COPD.  Sonographer:    Irving Burton Senior RDCS Referring Phys: [phone] [facility] [place] IMPRESSIONS  1. Left ventricular ejection fraction, by estimation, is 60 to 65%. The left ventricle has normal function. The left ventricle has no regional wall motion abnormalities. Left ventricular diastolic parameters are consistent with Grade I diastolic dysfunction (impaired relaxation).  2. There appear to be significant trabeculations in the RV apex. Given RV dysfunction, recommend cardiac MRI to evaluate RV further. Right ventricular systolic function is moderately reduced. The right ventricular size is mildly enlarged. There is severely elevated pulmonary artery systolic pressure. The estimated right ventricular systolic pressure is 64.0 mmHg.  3. Right atrial size was severely dilated.  4. The mitral valve is normal in structure. No evidence of mitral valve regurgitation. No evidence of mitral stenosis.  5. Tricuspid valve regurgitation is moderate.  6. The aortic valve is normal in structure. Aortic valve regurgitation is not visualized. No aortic stenosis is present.  7. The inferior vena cava is dilated in size with <50% respiratory variability, suggesting right atrial pressure of 15 mmHg. FINDINGS  Left Ventricle: Left ventricular ejection fraction, by estimation, is 60 to 65%. The left ventricle has normal function. The left  ventricle has no regional wall motion abnormalities. The left ventricular internal cavity size was normal in size. There is  no left ventricular hypertrophy. Left ventricular diastolic parameters are consistent with Grade I diastolic dysfunction (impaired relaxation). Normal left ventricular filling pressure. Right Ventricle: There appear to be significant trabeculations in the RV apex. Given RV dysfunction, recommend cardiac MRI to evaluate RV further. The right ventricular size is mildly enlarged. No increase in right ventricular wall thickness. Right ventricular systolic function is moderately reduced. There is severely elevated pulmonary artery systolic pressure. The tricuspid regurgitant velocity  is 3.50 m/s, and with an assumed right atrial pressure of 15 mmHg, the estimated right ventricular systolic pressure is 64.0 mmHg. Left Atrium: Left atrial size was normal in size. Right Atrium: Right atrial size was severely dilated. Pericardium: A small pericardial effusion is present. The pericardial effusion is circumferential. Mitral Valve: The mitral valve is normal in structure. Normal mobility of the mitral valve leaflets. No evidence of mitral valve regurgitation. No evidence of mitral valve stenosis. Tricuspid Valve: The tricuspid valve is normal in structure. Tricuspid valve regurgitation is moderate . No evidence of tricuspid stenosis. Aortic Valve: The aortic valve is normal in structure. Aortic valve regurgitation is not visualized. No aortic stenosis is present. Pulmonic Valve: The pulmonic valve was normal in structure. Pulmonic valve regurgitation is trivial. No evidence of pulmonic stenosis. Aorta: The aortic root is normal in size and structure. Venous: The inferior vena cava is dilated in size with less than 50% respiratory variability, suggesting right atrial pressure of 15 mmHg. IAS/Shunts: No atrial level shunt detected by color flow Doppler.  LEFT VENTRICLE PLAX 2D LVIDd:         3.40 cm   Diastology LVIDs:         2.20 cm  LV e' lateral:   9.57 cm/s LV PW:         0.80 cm  LV E/e' lateral: 6.2 LV IVS:        0.90 cm  LV e' medial:    8.05 cm/s LVOT diam:     1.80 cm  LV E/e' medial:  7.4 LV SV:         32 LV SV Index:   19 LVOT Area:     2.54 cm  RIGHT VENTRICLE RV S prime:     8.49 cm/s TAPSE (M-mode): 1.2 cm LEFT ATRIUM             Index       RIGHT ATRIUM           Index LA diam:        3.60 cm 2.15 cm/m  RA Area:     22.00 cm LA Vol (A2C):   47.3 ml 28.19 ml/m RA Volume:   65.30 ml  38.91 ml/m LA Vol (A4C):   28.8 ml 17.16 ml/m LA Biplane Vol: 38.4 ml 22.88 ml/m  AORTIC VALVE LVOT Vmax:   76.10 cm/s LVOT Vmean:  52.300 cm/s LVOT VTI:    0.126 m  AORTA Ao Root diam: 2.60 cm Ao Asc diam:  2.80 cm MITRAL VALVE               TRICUSPID VALVE MV Area (PHT): 2.96 cm    TR Peak grad:   49.0 mmHg MV Decel Time: 256 msec    TR Vmax:        350.00 cm/s MV E velocity: 59.70 cm/s MV A velocity: 78.60 cm/s  SHUNTS MV E/A ratio:  0.76        Systemic VTI:  0.13 m                            Systemic Diam: 1.80 cm Armanda Magic MD Electronically signed by Armanda Magic MD Signature Date/Time: 02/21/2020/3:33:22 PM    Final    VAS [name] LOWER EXTREMITY VENOUS (DVT)  Result Date: 02/25/2020  Lower Venous DVTStudy Indications: SOB.  Comparison Study: No prior study on file for comparison Performing Technologist: Sherren Kerns RVS  Examination Guidelines:  A complete evaluation includes B-mode imaging, spectral Doppler, color Doppler, and power Doppler as needed of all accessible portions of each vessel. Bilateral testing is considered an integral part of a complete examination. Limited examinations for reoccurring indications may be performed as noted. The reflux portion of the exam is performed with the patient in reverse Trendelenburg.  +---------+---------------+---------+-----------+----------+--------------+ RIGHT    CompressibilityPhasicitySpontaneityPropertiesThrombus Aging  +---------+---------------+---------+-----------+----------+--------------+ CFV      Full           Yes      Yes                                 +---------+---------------+---------+-----------+----------+--------------+ SFJ      Full                                                        +---------+---------------+---------+-----------+----------+--------------+ FV Prox  Full                                                        +---------+---------------+---------+-----------+----------+--------------+ FV Mid   Full                                                        +---------+---------------+---------+-----------+----------+--------------+ FV DistalFull                                                        +---------+---------------+---------+-----------+----------+--------------+ PFV      Full                                                        +---------+---------------+---------+-----------+----------+--------------+ POP      Full           Yes      Yes                                 +---------+---------------+---------+-----------+----------+--------------+ PTV      Full                                                        +---------+---------------+---------+-----------+----------+--------------+ PERO     Full                                                        +---------+---------------+---------+-----------+----------+--------------+   +---------+---------------+---------+-----------+----------+--------------+  LEFT     CompressibilityPhasicitySpontaneityPropertiesThrombus Aging +---------+---------------+---------+-----------+----------+--------------+ CFV      Full           Yes      Yes                                 +---------+---------------+---------+-----------+----------+--------------+ SFJ      Full                                                         +---------+---------------+---------+-----------+----------+--------------+ FV Prox  Full                                                        +---------+---------------+---------+-----------+----------+--------------+ FV Mid   Full                                                        +---------+---------------+---------+-----------+----------+--------------+ FV DistalFull                                                        +---------+---------------+---------+-----------+----------+--------------+ PFV      Full                                                        +---------+---------------+---------+-----------+----------+--------------+ POP      Full           Yes      Yes                                 +---------+---------------+---------+-----------+----------+--------------+ PTV      Full                                                        +---------+---------------+---------+-----------+----------+--------------+ PERO     Full                                                        +---------+---------------+---------+-----------+----------+--------------+     Summary: BILATERAL: - No evidence of deep vein thrombosis seen in the lower extremities, bilaterally.  RIGHT: - Ultrasound characteristics of enlarged lymph nodes are noted in the groin.  LEFT: - Ultrasound characteristics of enlarged lymph nodes noted in the groin.  *  See table(s) above for measurements and observations. Electronically signed by Sherald Hess MD on 02/25/2020 at 4:07:25 PM.    Final    ECHOCARDIOGRAM LIMITED  Result Date: 02/26/2020    ECHOCARDIOGRAM LIMITED REPORT   Patient Name:   Michele Parrish Date of Exam: 02/26/2020 Medical Rec #:  [id number]        Height:       62.0 in Accession #:    [id number]       Weight:       143.3 lb Date of Birth:  [birth year]        BSA:          1.659 m Patient Age:    53 years         BP:           92/60 mmHg Patient Gender: F                 HR:           94 bpm. Exam Location:  Inpatient Procedure: Limited Echo, Limited Color Doppler and Cardiac Doppler Indications:    Pericardial effusion 423.9 / I31.3  History:        Patient has prior history of Echocardiogram examinations, most                 recent 02/21/2020. Moderate right ventricular heart dysfunction,                 Acute hypoxic respiratory failure, Acute kidney injury.  Sonographer:    Leta Jungling RDCS Referring Phys: [phone] TRACI R TURNER  Sonographer Comments: Exam ended early per patients request. IMPRESSIONS  1. Left ventricular ejection fraction, by estimation, is 65 to 70%. The left ventricle has normal function.  2. Right ventricular systolic function is moderately reduced. The right ventricular size is mildly enlarged.  3. The aortic valve is tricuspid. FINDINGS  Left Ventricle: Left ventricular ejection fraction, by estimation, is 65 to 70%. The left ventricle has normal function. Right Ventricle: The right ventricular size is mildly enlarged. Right ventricular systolic function is moderately reduced. Pericardium: Trivial pericardial effusion is present. The pericardial effusion is posterior to the left ventricle. There is no evidence of cardiac tamponade. Aortic Valve: The aortic valve is tricuspid. Pulmonic Valve: Pulmonic valve regurgitation is trivial. Donato Schultz MD Electronically signed by Donato Schultz MD Signature Date/Time: 02/26/2020/2:58:37 PM    Final    US Abdomen Limited RUQ  Result Date: 02/23/2020 CLINICAL DATA:  Hepatomegaly on physical exam EXAM: ULTRASOUND ABDOMEN LIMITED RIGHT UPPER QUADRANT COMPARISON:  None. FINDINGS: Gallbladder: No gallstones or wall thickening visualized. No sonographic Murphy sign noted by sonographer. Common bile duct: Diameter: 3.7 mm Liver: No focal lesion identified. Within normal limits in parenchymal echogenicity. Portal vein is patent on color Doppler imaging with normal direction of blood flow towards the liver. Other: None.  IMPRESSION: Unremarkable right upper quadrant ultrasound. Electronically Signed   By: Alcide Clever M.D.   On: 02/23/2020 23:09       Subjective: Patient is feeling well.  She has no dyspnea, no dyspnea on exertion, no weakness, no chest pain, no orthopnea, no swelling.  Discharge Exam: Vitals:   02/28/20 0824 02/28/20 1203  BP: (!) 98/58 98/62  Pulse: 87 98  Resp: 20 20  Temp: 97.6 F (36.4 C) 98.3 F (36.8 C)  SpO2: 95% 93%   Vitals:   02/27/20 2357 02/28/20 0408 02/28/20 0824 02/28/20 1203  BP: 100/64 104/66 (!) 98/58  98/62  Pulse: 100 95 87 98  Resp: 20 20 20 20   Temp: 98.2 F (36.8 C) 97.9 F (36.6 C) 97.6 F (36.4 C) 98.3 F (36.8 C)  TempSrc: Oral Oral Oral Oral  SpO2: 92% 90% 95% 93%  Weight:  53.3 kg    Height:        General: Pt is alert, awake, not in acute distress Cardiovascular: RRR, nl S1-S2, no murmurs appreciated.   No LE edema.   Respiratory: Mildly tachypneic respiratory rate and rhythm.  CTAB without rales or wheezes. Abdominal: Abdomen soft and non-tender.  No distension or HSM.   Neuro/Psych: Strength symmetric in upper and lower extremities.  Judgment and insight appear normal.   The results of significant diagnostics from this hospitalization (including imaging, microbiology, ancillary and laboratory) are listed below for reference.     Microbiology: Recent Results (from the past 240 hour(s))  Respiratory Panel by RT PCR (Flu A&B, Covid) - Nasopharyngeal Swab     Status: None   Collection Time: 02/21/20 10:18 AM   Specimen: Nasopharyngeal Swab  Result Value Ref Range Status   SARS Coronavirus 2 by RT PCR NEGATIVE NEGATIVE Final    Comment: (NOTE) SARS-CoV-2 target nucleic acids are NOT DETECTED. The SARS-CoV-2 RNA is generally detectable in upper respiratoy specimens during the acute phase of infection. The lowest concentration of SARS-CoV-2 viral copies this assay can detect is 131 copies/mL. A negative result does not preclude  SARS-Cov-2 infection and should not be used as the sole basis for treatment or other patient management decisions. A negative result may occur with  improper specimen collection/handling, submission of specimen other than nasopharyngeal swab, presence of viral mutation(s) within the areas targeted by this assay, and inadequate number of viral copies (<131 copies/mL). A negative result must be combined with clinical observations, patient history, and epidemiological information. The expected result is Negative. Fact Sheet for Patients:  https://www.moore.com/ Fact Sheet for Healthcare Providers:  https://www.young.biz/ This test is not yet ap proved or cleared by the [place] FDA and  has been authorized for detection and/or diagnosis of SARS-CoV-2 by FDA under an Emergency Use Authorization (EUA). This EUA will remain  in effect (meaning this test can be used) for the duration of the COVID-19 declaration under Section 564(b)(1) of the Act, 21 U.S.C. section 360bbb-3(b)(1), unless the authorization is terminated or revoked sooner.    Influenza A by PCR NEGATIVE NEGATIVE Final   Influenza B by PCR NEGATIVE NEGATIVE Final    Comment: (NOTE) The Xpert Xpress SARS-CoV-2/FLU/RSV assay is intended as an aid in  the diagnosis of influenza from Nasopharyngeal swab specimens and  should not be used as a sole basis for treatment. Nasal washings and  aspirates are unacceptable for Xpert Xpress SARS-CoV-2/FLU/RSV  testing. Fact Sheet for Patients: https://www.moore.com/ Fact Sheet for Healthcare Providers: https://www.young.biz/ This test is not yet approved or cleared by the [place] FDA and  has been authorized for detection and/or diagnosis of SARS-CoV-2 by  FDA under an Emergency Use Authorization (EUA). This EUA will remain  in effect (meaning this test can be used) for the duration of the  Covid-19  declaration under Section 564(b)(1) of the Act, 21  U.S.C. section 360bbb-3(b)(1), unless the authorization is  terminated or revoked. Performed at [facility], [address] [place], [place] [address]   Urine culture     Status: None   Collection Time: 02/21/20  3:36 PM   Specimen: Urine, Random  Result Value  Ref Range Status   Specimen Description   Final    URINE, RANDOM Performed at [facility], [address], [place], [place] [address]    Special Requests   Final    NONE Performed at [facility], [address] [place], [place] [address]    Culture   Final    NO GROWTH Performed at [facility], [address] [place], [place] [address]    Report Status 02/22/2020 FINAL  Final     Labs: BNP (last 3 results) Recent Labs    02/21/20 0829 02/22/20 0549  BNP 986.4* 846.6*   Basic Metabolic Panel: Recent Labs  Lab 02/22/20 0549 02/22/20 0549 02/23/20 0536 02/23/20 0536 02/23/20 0918 02/24/20 0321 02/25/20 0448 02/26/20 0357 02/27/20 0422  NA 140   < > 141  138   < > 142  142 143 141 141 140  K 4.0   < > 3.7  3.7   < > 3.9  3.8 4.1 4.2 3.9 3.9  CL 110   < > 102  102  --   --  99 98 94* 95*  CO2 25   < > 32  29  --   --  31 35* 37* 34*  GLUCOSE 92   < > 91  87  --   --  89 91 121* 109*  BUN 17   < > 16  16  --   --  18 26* 25* 31*  CREATININE 1.08*   < > 1.30*  1.31*  --   --  1.05* 1.24* 1.27* 1.18*  CALCIUM 8.0*   < > 8.0*  7.8*  --   --  8.6* 9.1 9.4 9.0  MG 1.6*  --  1.9  --   --  1.9 1.8  --   --   PHOS 4.2  --   --   --   --   --  4.1  --   --    < > = values in this interval not displayed.   Liver Function Tests: Recent Labs  Lab 02/22/20 0549 02/23/20 0536 02/24/20 0321  AST 25 21 17   ALT 20 18 13   ALKPHOS 90 85 70  BILITOT 0.9 1.1 1.0  PROT 6.7 6.7 6.8  ALBUMIN 2.7* 2.7* 3.5   No results for input(s): LIPASE, AMYLASE in the last 168 hours. No results for  input(s): AMMONIA in the last 168 hours. CBC: Recent Labs  Lab 02/22/20 0549 02/23/20 0536 02/23/20 0918 02/24/20 0321 02/25/20 0448  WBC 6.9 7.5  --  7.8 8.6  NEUTROABS  --  3.5  --   --  4.4  HGB 11.4* 11.8* 13.9  13.9 11.5* 11.4*  HCT 38.4 39.2 41.0  41.0 37.8 37.9  MCV 86.1 86.2  --  84.8 83.8  PLT 184 200  --  191 203   Cardiac Enzymes: No results for input(s): CKTOTAL, CKMB, CKMBINDEX, TROPONINI in the last 168 hours. BNP: Invalid input(s): POCBNP CBG: No results for input(s): GLUCAP in the last 168 hours. D-Dimer No results for input(s): DDIMER in the last 72 hours. Hgb A1c No results for input(s): HGBA1C in the last 72 hours. Lipid Profile No results for input(s): CHOL, HDL, LDLCALC, TRIG, CHOLHDL, LDLDIRECT in the last 72 hours. Thyroid function studies No results for input(s): TSH, T4TOTAL, T3FREE, THYROIDAB in the last 72 hours.  Invalid input(s): FREET3 Anemia work up No results for input(s): VITAMINB12, FOLATE, FERRITIN, TIBC,  IRON, RETICCTPCT in the last 72 hours. Urinalysis    Component Value Date/Time   COLORURINE STRAW (A) 02/25/2020 1415   APPEARANCEUR CLEAR 02/25/2020 1415   LABSPEC 1.005 02/25/2020 1415   PHURINE 9.0 (H) 02/25/2020 1415   GLUCOSEU NEGATIVE 02/25/2020 1415   HGBUR NEGATIVE 02/25/2020 1415   BILIRUBINUR NEGATIVE 02/25/2020 1415   KETONESUR NEGATIVE 02/25/2020 1415   PROTEINUR NEGATIVE 02/25/2020 1415   UROBILINOGEN 1.0 10/06/2010 0637   NITRITE NEGATIVE 02/25/2020 1415   LEUKOCYTESUR NEGATIVE 02/25/2020 1415   Sepsis Labs Invalid input(s): PROCALCITONIN,  WBC,  LACTICIDVEN Microbiology Recent Results (from the past 240 hour(s))  Respiratory Panel by RT PCR (Flu A&B, Covid) - Nasopharyngeal Swab     Status: None   Collection Time: 02/21/20 10:18 AM   Specimen: Nasopharyngeal Swab  Result Value Ref Range Status   SARS Coronavirus 2 by RT PCR NEGATIVE NEGATIVE Final    Comment: (NOTE) SARS-CoV-2 target nucleic acids are NOT  DETECTED. The SARS-CoV-2 RNA is generally detectable in upper respiratoy specimens during the acute phase of infection. The lowest concentration of SARS-CoV-2 viral copies this assay can detect is 131 copies/mL. A negative result does not preclude SARS-Cov-2 infection and should not be used as the sole basis for treatment or other patient management decisions. A negative result may occur with  improper specimen collection/handling, submission of specimen other than nasopharyngeal swab, presence of viral mutation(s) within the areas targeted by this assay, and inadequate number of viral copies (<131 copies/mL). A negative result must be combined with clinical observations, patient history, and epidemiological information. The expected result is Negative. Fact Sheet for Patients:  https://www.moore.com/ Fact Sheet for Healthcare Providers:  https://www.young.biz/ This test is not yet ap proved or cleared by the [place] FDA and  has been authorized for detection and/or diagnosis of SARS-CoV-2 by FDA under an Emergency Use Authorization (EUA). This EUA will remain  in effect (meaning this test can be used) for the duration of the COVID-19 declaration under Section 564(b)(1) of the Act, 21 U.S.C. section 360bbb-3(b)(1), unless the authorization is terminated or revoked sooner.    Influenza A by PCR NEGATIVE NEGATIVE Final   Influenza B by PCR NEGATIVE NEGATIVE Final    Comment: (NOTE) The Xpert Xpress SARS-CoV-2/FLU/RSV assay is intended as an aid in  the diagnosis of influenza from Nasopharyngeal swab specimens and  should not be used as a sole basis for treatment. Nasal washings and  aspirates are unacceptable for Xpert Xpress SARS-CoV-2/FLU/RSV  testing. Fact Sheet for Patients: https://www.moore.com/ Fact Sheet for Healthcare Providers: https://www.young.biz/ This test is not yet approved or cleared  by the [place] FDA and  has been authorized for detection and/or diagnosis of SARS-CoV-2 by  FDA under an Emergency Use Authorization (EUA). This EUA will remain  in effect (meaning this test can be used) for the duration of the  Covid-19 declaration under Section 564(b)(1) of the Act, 21  U.S.C. section 360bbb-3(b)(1), unless the authorization is  terminated or revoked. Performed at [facility], [address], [place], [place] [address]   Urine culture     Status: None   Collection Time: 02/21/20  3:36 PM   Specimen: Urine, Random  Result Value Ref Range Status   Specimen Description   Final    URINE, RANDOM Performed at [facility], [address], [place], [place] [address]    Special Requests   Final    NONE Performed at [facility], [address] [place], [place] [address]    Culture   Final    NO GROWTH Performed at [facility], [address], [place], [place] [address]    Report Status 02/22/2020 FINAL  Final     Time coordinating discharge: 35 minutes      SIGNED:   Edwin Dada, MD  [facility] 02/28/2020, 8:05 PM

## 2020-02-28 NOTE — Progress Notes (Signed)
Discharge instructions (including medications) discussed with and copy provided to patient/caregiver 

## 2020-02-28 NOTE — Progress Notes (Signed)
SATURATION QUALIFICATIONS: (This note is used to comply with regulatory documentation for home oxygen)  Patient Saturations on Room Air at Rest = 82%  Patient Saturations on Room Air while Ambulating = Not tested  Patient Saturations on 4 Liters of oxygen while Ambulating = 75%  Please briefly explain why patient needs home oxygen: COPD and pulmonary hypertension

## 2020-02-28 NOTE — Plan of Care (Signed)
  Problem: Education: Goal: Knowledge of General Education information will improve Description: Including pain rating scale, medication(s)/side effects and non-pharmacologic comfort measures Outcome: Adequate for Discharge   

## 2020-02-29 ENCOUNTER — Telehealth: Payer: Self-pay

## 2020-02-29 MED FILL — NICOTINE 14 MG/24HR PATCH: 14 | 28 days supply | Qty: 28 | Fill #0

## 2020-02-29 NOTE — Telephone Encounter (Signed)
Hospital Discharge Telephone Follow up  Date of discharge and from where: 02/28/2020 from [facility]  How have you been since you were released from the hospital? Ok better than in the hospital Any questions or concerns? Yes about prescribed  nicotine patch/ stated were not given. [name], CM RN, called  [name] pharmacy and was informed that the patches were OTC and were 42$, and that she did not have any money .She stated that the other 2 meds were free and was told that Nicotine patch  do not have it.  [facility] pharmacy transferred prescription at a lower cost/  Notified pt that she could pick them up at [facility] center . Patient aware of cost and  Agreed.  Items Reviewed:  Did the pt receive and understand the discharge instructions provided? YES   Medications obtained and verified? But nicotine patches will pick them up today or tomorrow.   Any new allergies since your discharge? NONE  Dietary orders reviewed? YES   Do you have support at home?Lives by herself / asks friends to help sometimes  Functional Questionnaire: (I = Independent and D = Dependent) ADLs: I  Follow up appointments reviewed:   PCP Hospital f/u appt confirmed?   Scheduled to see Dr Shan Levans on 03/11/2020 @ 9:00 am . Stated she asked a friend to bring her to bring her to the appt.   Specialist Hospital f/u appt confirmed?  Scheduled to see Cardiology and Pulmonary  on 05/10/2021and 03/27/2020   Are transportation arrangements needed? Stated she will ask her friend to drive or take public transportation. Offered buss pass if needed.    If their condition worsens, is the pt aware to call PCP or go to the Emergency Dept.? YES made pt aware of all signs and symptoms needed to watch for as per MD discharge  Instructions.  Was the patient provided with contact information for the PCP's office or ED? YES   Was to pt encouraged to call back with questions or concerns? YES Call back nr and name was  provided .  Reinforced Compliance with prescribed meds and to stay off of cigarettes. Stressed the importance  to record of daily weight. Verbalized understanding DME Pt on 4L O2 viaNC for ambulation supplied by [facility]. Has 4 full tanks at home. Stated she has their contact name and phone nr . Instructed to call us and let us know if she can not reach them for additional supply.  Stated no other  home health services were provided at this time.

## 2020-03-01 ENCOUNTER — Other Ambulatory Visit: Payer: Self-pay | Admitting: Physician Assistant

## 2020-03-01 DIAGNOSIS — N179 Acute kidney failure, unspecified: Secondary | ICD-10-CM

## 2020-03-07 ENCOUNTER — Other Ambulatory Visit: Payer: Self-pay

## 2020-03-07 ENCOUNTER — Other Ambulatory Visit: Payer: Self-pay | Admitting: *Deleted

## 2020-03-07 DIAGNOSIS — N179 Acute kidney failure, unspecified: Secondary | ICD-10-CM

## 2020-03-07 LAB — BASIC METABOLIC PANEL
BUN/Creatinine Ratio: 18 (ref 9–23)
BUN: 22 mg/dL (ref 6–24)
CO2: 22 mmol/L (ref 20–29)
Calcium: 9.3 mg/dL (ref 8.7–10.2)
Chloride: 101 mmol/L (ref 96–106)
Creatinine, Ser: 1.24 mg/dL — ABNORMAL HIGH (ref 0.57–1.00)
GFR calc Af Amer: 58 mL/min/{1.73_m2} — ABNORMAL LOW (ref >59)
GFR calc non Af Amer: 50 mL/min/{1.73_m2} — ABNORMAL LOW (ref >59)
Glucose: 100 mg/dL — ABNORMAL HIGH (ref 65–99)
Potassium: 4.2 mmol/L (ref 3.5–5.2)
Sodium: 139 mmol/L (ref 134–144)

## 2020-03-10 NOTE — Progress Notes (Deleted)
Subjective:    Patient ID: Michele Parrish, female    DOB: Jul 23, 1966, 54 y.o.   MRN: [id number]  55 y.o.F former [facility] PCP pt post hosp TCM visit  DC summary as below:  Admit date: 02/21/2020 Discharge date: 02/28/2020  Admitted From: Home  Disposition:  Home with O2   Recommendations for Outpatient Follow-up:  1. Follow up with Pulmonology 2. Follow up with Cardiology 3. Follow up with new PCP Dr. Delford Field 4. Dr. Delford Field please follow-up pulmonary nodules 5. Outpatient sleep study per pulmonology   Home Health: None  Equipment/Devices: O2  Discharge Condition: Fair  CODE STATUS: FULL Diet recommendation: Regular  Brief/Interim Summary: Mrs. Michele Parrish is a 54 y.o. F with hx chronic bronchitis, poor medical follow up, smoking who presented with 2 weeks progressive shortness of breath, orthopnea, increased abdominal girth, leg swelling.  In the ER she was saturating 80% on room air.  Started on IV Lasix and admitted.     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  Echocardiogram showed normal EF, grade 1 diastolic dysfunction, but right ventricular dysfunction.  Cardiology was called to see the patient, who underwent right heart cath which showed severe pulmonary hypertension.  Pulmonology were consulted. They noted severe COPD, pulmonary nodule suggestive of sarcoidosis, and severe pulmonary hypertension.    The patient gradually was weaned down from high flow nasal cannula to 4 L supplemental oxygen.  She was diuresed 36 liters on admission.  She has follow-up with pulmonology, who recommended bronchodilators, supplemental oxygen 24/7.    CKD stage II Creatinine stabilized around 1.1  Multiple pulmonary nodules -Repeat CT scan in 3  months -Follow-up with pulmonology  Moderate protein calorie malnutrition As evidenced by weight loss, BMI 21, and albumin 2.7.  Smoking Smoking cessation recommended, modalities discussed    TCM note per RN: Note    Hospital Discharge Telephone Follow up  Date of discharge and from where: 02/28/2020 from [facility]  How have you been since you were released from the hospital? Ok better than in the hospital Any questions or concerns? Yes about prescribed  nicotine patch/ stated were not given. Michele Parrish, CM RN, called  [facility] pharmacy and was informed that the patches were OTC and were 42$, and that she did not have any money .She stated that the other 2 meds were free and was told that Nicotine patch  do not have it.  [facility] pharmacy transferred prescription at a lower cost/  Notified pt that she could pick them up at [facility] center . Patient aware of cost and  Agreed.  Items Reviewed:  Did the pt receive and understand the discharge instructions provided? YES   Medications obtained and verified? But nicotine patches will pick them up today or tomorrow.   Any new allergies since your discharge? NONE  Dietary orders reviewed? YES   Do you have support at home?Lives by herself / asks friends to help sometimes  Functional Questionnaire: (I = Independent and D = Dependent) ADLs: I  Follow up appointments reviewed:   PCP Hospital f/u appt confirmed?   Scheduled to see Dr Shan Levans on 03/11/2020 @ 9:00 am . Stated she asked a friend to bring her to bring her to the appt.   Specialist Hospital f/u appt confirmed?  Scheduled to see Cardiology  and Pulmonary  on 05/10/2021and 03/27/2020   Are transportation arrangements needed? Stated she will ask her friend to drive or take public transportation. Offered buss pass if needed.    If their condition worsens, is the pt aware to call PCP or go to the Emergency Dept.? YES made pt aware of all signs and symptoms  needed to watch for as per MD discharge  Instructions.  Was the patient provided with contact information for the PCP's office or ED? YES   Was to pt encouraged to call back with questions or concerns? YES Call back nr and name was provided .  Reinforced Compliance with prescribed meds and to stay off of cigarettes. Stressed the importance  to record of daily weight. Verbalized understanding DME Pt on 4L O2 viaNC for ambulation supplied by [facility]. Has 4 full tanks at home. Stated she has their contact name and phone nr . Instructed to call us and let us know if she can not reach them for additional supply.  Stated no other  home health services were provided at this time.        Review of Systems     Objective:   Physical Exam        Assessment & Plan:

## 2020-03-11 ENCOUNTER — Inpatient Hospital Stay: Payer: Medicaid Other | Admitting: Critical Care Medicine

## 2020-03-18 DIAGNOSIS — N182 Chronic kidney disease, stage 2 (mild): Secondary | ICD-10-CM | POA: Insufficient documentation

## 2020-03-18 NOTE — Progress Notes (Deleted)
Cardiology Office Note   Date:  03/18/2020   ID:  Michele Parrish 1966-05-09, MRN [id number]  PCP:  Loletta Specter, PA-C  Cardiologist:   Rollene Rotunda, MD Referring:  ***  No chief complaint on file.     History of Present Illness: Michele Parrish is a 54 y.o. female who presents for follow up of acute respiratory failure. She was recently in the hospital with acute respiratory failure.  She had COPD, pulmonary HTN and COPD.  She had 36 liters of diuresis during the admission.  Right heart cath demonstrated a wedge of 15 and mean PA of 40.  She had significant trabeculations in the RV but MRI but there was no infiltrative disease on MRI.   She was sent home on Lasix 40 mg BID.    ***   Past Medical History:  Diagnosis Date  . Bronchitis     Past Surgical History:  Procedure Laterality Date  . ABDOMINAL HYSTERECTOMY    . RIGHT HEART CATH N/A 02/23/2020   Procedure: RIGHT HEART CATH;  Surgeon: Iran Ouch, MD;  Location: [facility];  Service: Cardiovascular;  Laterality: N/A;     Current Outpatient Medications  Medication Sig Dispense Refill  . albuterol (VENTOLIN HFA) 108 (90 Base) MCG/ACT inhaler Inhale 2 puffs into the lungs every 4 (four) hours as needed for wheezing or shortness of breath. 8 g 12  . fluticasone furoate-vilanterol (BREO ELLIPTA) 200-25 MCG/INH AEPB Inhale 1 puff into the lungs daily. 1 each 5  . furosemide (LASIX) 40 MG tablet Take 1 tablet (40 mg total) by mouth 2 (two) times daily. 60 tablet 12  . ipratropium-albuterol (DUONEB) 0.5-2.5 (3) MG/3ML SOLN Take 3 mLs by nebulization every 6 (six) hours as needed. 360 mL 1  . nicotine (NICODERM CQ - DOSED IN MG/24 HOURS) 14 mg/24hr patch Place 1 patch (14 mg total) onto the skin daily. 28 patch 0  . tiotropium (SPIRIVA HANDIHALER) 18 MCG inhalation capsule Place 1 capsule (18 mcg total) into inhaler and inhale daily. 30 capsule 2   No current facility-administered medications  for this visit.    Allergies:   Patient has no known allergies.    Social History:  The patient  reports that she has been smoking cigarettes. She has a 63.00 pack-year smoking history. She has never used smokeless tobacco. She reports current alcohol use. She reports that she does not use drugs.   Family History:  The patient's ***family history includes Multiple sclerosis in her mother; Prostate cancer in her father; Stroke in her father.    ROS:  Please see the history of present illness.   Otherwise, review of systems are positive for {NONE DEFAULTED:18576::"none"}.   All other systems are reviewed and negative.    PHYSICAL EXAM: VS:  There were no vitals taken for this visit. , BMI There is no height or weight on file to calculate BMI. GENERAL:  Well appearing HEENT:  Pupils equal round and reactive, fundi not visualized, oral mucosa unremarkable NECK:  No jugular venous distention, waveform within normal limits, carotid upstroke brisk and symmetric, no bruits, no thyromegaly LYMPHATICS:  No cervical, inguinal adenopathy LUNGS:  Clear to auscultation bilaterally BACK:  No CVA tenderness CHEST:  Unremarkable HEART:  PMI not displaced or sustained,S1 and S2 within normal limits, no S3, no S4, no clicks, no rubs, *** murmurs ABD:  Flat, positive bowel sounds normal in frequency in pitch, no bruits, no rebound, no guarding,  no midline pulsatile mass, no hepatomegaly, no splenomegaly EXT:  2 plus pulses throughout, no edema, no cyanosis no clubbing SKIN:  No rashes no nodules NEURO:  Cranial nerves II through XII grossly intact, motor grossly intact throughout PSYCH:  Cognitively intact, oriented to person place and time    EKG:  EKG {ACTION; IS/IS YYT:03546568} ordered today. The ekg ordered today demonstrates ***   Recent Labs: 02/21/2020: TSH 4.561 02/22/2020: B Natriuretic Peptide 846.6 02/24/2020: ALT 13 02/25/2020: Hemoglobin 11.4; Magnesium 1.8; Platelets 203 03/07/2020: BUN  22; Creatinine, Ser 1.24; Potassium 4.2; Sodium 139    Lipid Panel    Component Value Date/Time   CHOL 119 02/21/2020 1605   TRIG 85 02/21/2020 1605   HDL 37 (L) 02/21/2020 1605   CHOLHDL 3.2 02/21/2020 1605   VLDL 17 02/21/2020 1605   LDLCALC 65 02/21/2020 1605      Wt Readings from Last 3 Encounters:  02/28/20 117 lb 9.6 oz (53.3 kg)  07/22/17 144 lb 9.6 oz (65.6 kg)  11/27/10 180 lb (81.6 kg)      Other studies Reviewed: Additional studies/ records that were reviewed today include: ***. Review of the above records demonstrates:  Please see elsewhere in the note.  ***   ASSESSMENT AND PLAN:  ACUTE DIASTOLIC HF:  ***  PULMONARY HTN:  ***   CKD II:  ***  TOBACCO ABUSE:  ***   Current medicines are reviewed at length with the patient today.  The patient {ACTIONS; HAS/DOES NOT HAVE:19233} concerns regarding medicines.  The following changes have been made:  {PLAN; NO CHANGE:13088:s}  Labs/ tests ordered today include: *** No orders of the defined types were placed in this encounter.    Disposition:   FU with ***    Signed, Minus Breeding, MD  03/18/2020 8:31 PM    [facility]

## 2020-03-19 ENCOUNTER — Ambulatory Visit: Payer: Self-pay | Admitting: Cardiology

## 2020-03-26 ENCOUNTER — Other Ambulatory Visit: Payer: Self-pay | Admitting: Critical Care Medicine

## 2020-03-26 ENCOUNTER — Other Ambulatory Visit: Payer: Self-pay

## 2020-03-26 ENCOUNTER — Ambulatory Visit: Payer: Medicaid Other | Attending: Critical Care Medicine | Admitting: Critical Care Medicine

## 2020-03-26 ENCOUNTER — Encounter: Payer: Self-pay | Admitting: Critical Care Medicine

## 2020-03-26 VITALS — BP 136/89 | HR 103 | Ht 62.0 in | Wt 120.8 lb

## 2020-03-26 DIAGNOSIS — J9611 Chronic respiratory failure with hypoxia: Secondary | ICD-10-CM | POA: Diagnosis not present

## 2020-03-26 DIAGNOSIS — J441 Chronic obstructive pulmonary disease with (acute) exacerbation: Secondary | ICD-10-CM

## 2020-03-26 DIAGNOSIS — Z720 Tobacco use: Secondary | ICD-10-CM

## 2020-03-26 DIAGNOSIS — R918 Other nonspecific abnormal finding of lung field: Secondary | ICD-10-CM

## 2020-03-26 DIAGNOSIS — N182 Chronic kidney disease, stage 2 (mild): Secondary | ICD-10-CM

## 2020-03-26 DIAGNOSIS — I272 Pulmonary hypertension, unspecified: Secondary | ICD-10-CM

## 2020-03-26 DIAGNOSIS — J449 Chronic obstructive pulmonary disease, unspecified: Secondary | ICD-10-CM | POA: Diagnosis not present

## 2020-03-26 DIAGNOSIS — I5031 Acute diastolic (congestive) heart failure: Secondary | ICD-10-CM

## 2020-03-26 DIAGNOSIS — N179 Acute kidney failure, unspecified: Secondary | ICD-10-CM

## 2020-03-26 MED ORDER — IPRATROPIUM-ALBUTEROL 0.5-2.5 (3) MG/3ML IN SOLN: 3.0000 mL | Freq: Four times a day (QID) | RESPIRATORY_TRACT | 1 refills | Status: DC | PRN

## 2020-03-26 MED ORDER — SPIRIVA HANDIHALER 18 MCG IN CAPS: 18.0000 ug | ORAL_CAPSULE | Freq: Every day | RESPIRATORY_TRACT | 11 refills | Status: DC

## 2020-03-26 MED ORDER — BREO ELLIPTA 200-25 MCG/INH IN AEPB: 1.0000 | INHALATION_SPRAY | Freq: Every day | RESPIRATORY_TRACT | 5 refills | Status: DC

## 2020-03-26 MED ORDER — FUROSEMIDE 40 MG PO TABS: 40.0000 mg | ORAL_TABLET | Freq: Two times a day (BID) | ORAL | 12 refills | Status: DC

## 2020-03-26 MED ORDER — NICOTINE 14 MG/24HR TD PT24: 14.0000 mg | MEDICATED_PATCH | Freq: Every day | TRANSDERMAL | 0 refills | Status: DC

## 2020-03-26 MED ORDER — ALBUTEROL SULFATE HFA 108 (90 BASE) MCG/ACT IN AERS: 2.0000 | INHALATION_SPRAY | RESPIRATORY_TRACT | 12 refills | Status: DC | PRN

## 2020-03-26 MED FILL — NICOTINE 14 MG/24HR PATCH: 14 | 28 days supply | Qty: 28 | Fill #0

## 2020-03-26 MED FILL — !VENTOLIN HFA INHALER: 108 (90 BAS | 16 days supply | Qty: 18 | Fill #0

## 2020-03-26 MED FILL — FUROSEMIDE 40 MG TAB: 40 | 30 days supply | Qty: 60 | Fill #0

## 2020-03-26 MED FILL — IPRAT-ALBUT 0.5-3(2.5) MG/3: 0.5-2.5 (3) | 30 days supply | Qty: 360 | Fill #0

## 2020-03-26 MED FILL — SPIRIVA 18 MCG CP-HANDIHALE: 18 | 30 days supply | Qty: 30 | Fill #0

## 2020-03-26 MED FILL — !BREO ELLIPTA 200-25 MCG: 200-25 | 30 days supply | Qty: 60 | Fill #0

## 2020-03-26 NOTE — Progress Notes (Signed)
Subjective:    Patient ID: Michele Parrish, female    DOB: [birth year], 54 y.o.   MRN: [id number]   54 y.o.F s/p hosp stay for COPD exac and hypoxic RF in April.  Here to re est care and HFU. Adept home health.  Missed last post hosp visit  The patient is here in the office today wishing to establish for primary care after hospitalization for COPD exacerbation, pulmonary hypertension, anasarca, severe chronic hypoxic respiratory failure.  The patient initially was admitted between 28 April and 5 May with severe COPD exacerbation and significant pulmonary hypertension and severe hypoxemia.  Patient initially was on 80% saturation on room air.  Despite high flow oxygen she was difficult to oxygenate but after significant diuresis patient's oxygenation did improve.  Below is the discharge summary Admit date: 02/21/2020 Discharge date: 02/28/2020  Admitted From: Home  Disposition:  Home with O2   Recommendations for Outpatient Follow-up:  1. Follow up with Pulmonology 2. Follow up with Cardiology 3. Follow up with new PCP Dr. Delford Field 4. Dr. Delford Field please follow-up pulmonary nodules 5. Outpatient sleep study per pulmonology   Home Health: None  Equipment/Devices: O2  Discharge Condition: Fair  CODE STATUS: FULL Diet recommendation: Regular  Brief/Interim Summary: [name] is a 54 y.o. F with hx chronic bronchitis, poor medical follow up, smoking who presented with 2 weeks progressive shortness of breath, orthopnea, increased abdominal girth, leg swelling.  In the ER she was saturating 80% on room air.  Started on IV Lasix and admitted.  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  Echocardiogram showed normal EF, grade 1 diastolic dysfunction, but right  ventricular dysfunction.  Cardiology was called to see the patient, who underwent right heart cath which showed severe pulmonary hypertension.  Pulmonology were consulted. They noted severe COPD, pulmonary nodule suggestive of sarcoidosis, and severe pulmonary hypertension.    The patient gradually was weaned down from high flow nasal cannula to 4 L supplemental oxygen.  She was diuresed 36 liters on admission.  She has follow-up with pulmonology, who recommended bronchodilators, supplemental oxygen 24/7.    CKD stage II Creatinine stabilized around 1.1  Multiple pulmonary nodules -Repeat CT scan in 3 months -Follow-up with pulmonology  Moderate protein calorie malnutrition As evidenced by weight loss, BMI 21, and albumin 2.7.  Smoking Smoking cessation recommended, modalities discussed  Below is a transition of care RN note on May 6  TCM RN note 02/29/20:   Hospital Discharge Telephone Follow up  Date of discharge and from where: 02/28/2020 from [name]  How have you been since you were released from the hospital? [facility] better than in the hospital Any questions or concerns? Yes about prescribed  nicotine patch/ stated were not given. [name], CM RN, called  [name] pharmacy and was informed that the patches were OTC and were 42$, and that she did not have any money .She stated that the other 2 meds were free and was told that Nicotine patch  do not have it.  [facility] pharmacy transferred prescription at a lower cost/  Notified pt that she could pick them up at [facility] center . Patient aware of cost and  Agreed.  Items Reviewed:  Did the pt receive and understand the discharge instructions provided? YES   Medications obtained and  verified? But nicotine patches will pick them up today or tomorrow.   Any new allergies since your discharge? NONE  Dietary orders reviewed? YES   Do you have support at home?Lives by herself / asks friends to help  sometimes  Functional Questionnaire: (I = Independent and D = Dependent) ADLs: I  Follow up appointments reviewed:   PCP Hospital f/u appt confirmed?   Scheduled to see Dr Asencion Noble on 03/11/2020 @ 9:00 am . Stated she asked a friend to bring her to bring her to the appt.   [facility] f/u appt confirmed?  Scheduled to see Cardiology and Pulmonary  on 05/10/2021and 03/27/2020   Are transportation arrangements needed? Stated she will ask her friend to drive or take public transportation. Offered buss pass if needed.    If their condition worsens, is the pt aware to call PCP or go to the Emergency Dept.? YES made pt aware of all signs and symptoms needed to watch for as per MD discharge  Instructions.  Was the patient provided with contact information for the PCP's office or ED? YES   Was to pt encouraged to call back with questions or concerns? YES Call back nr and name was provided .  Reinforced Compliance with prescribed meds and to stay off of cigarettes. Stressed the importance  to record of daily weight. Verbalized understanding DME Pt on 4L O2 viaNC for ambulation supplied by [facility]. Has 4 full tanks at home. Stated she has their contact name and phone nr . Instructed to call us and let us know if she can not reach them for additional supply.  Stated no other  home health services were provided at this time.   Pt failed f/u visit in May.  Since discharge the patient's breathing is better but she states the nasal cannula dries her nose out and she prefers a mask or cannula.  Also note there is no humidity in the home attached to the concentrator.  As well the patient states no one is shown her how to use her portable oxygen system.  The patient states she does have less edema in the feet not as much cough no chest pain no wheezing   Past Medical History:  Diagnosis Date  . Acute exacerbation of CHF (congestive heart failure) ([place]) 02/22/2020  . Bronchitis       Family History  Problem Relation Age of Onset  . Prostate cancer Father   . Stroke Father   . Multiple sclerosis Mother      Social History   Socioeconomic History  . Marital status: Single    Spouse name: Not on file  . Number of children: Not on file  . Years of education: Not on file  . Highest education level: Not on file  Occupational History  . Not on file  Tobacco Use  . Smoking status: Current Every Day Smoker    Packs/day: 1.50    Years: 42.00    Pack years: 63.00    Types: Cigarettes  . Smokeless tobacco: Never Used  . Tobacco comment: 1/2 ppd currently  Substance and Sexual Activity  . Alcohol use: Yes    Comment: 2 beers per day  . Drug use: No  . Sexual activity: Not on file  Other Topics Concern  . Not on file  Social History Narrative  . Not on file   Social Determinants of Health   Financial Resource Strain:   . Difficulty of Paying Living Expenses:  Food Insecurity:   . Worried About Programme researcher, broadcasting/film/video in the Last Year:   . Barista in the Last Year:   Transportation Needs:   . Freight forwarder (Medical):   Marland Kitchen Lack of Transportation (Non-Medical):   Physical Activity:   . Days of Exercise per Week:   . Minutes of Exercise per Session:   Stress:   . Feeling of Stress :   Social Connections:   . Frequency of Communication with Friends and Family:   . Frequency of Social Gatherings with Friends and Family:   . Attends Religious Services:   . Active Member of Clubs or Organizations:   . Attends Banker Meetings:   Marland Kitchen Marital Status:   Intimate Partner Violence:   . Fear of Current or Ex-Partner:   . Emotionally Abused:   Marland Kitchen Physically Abused:   . Sexually Abused:      No Known Allergies   Outpatient Medications Prior to Visit  Medication Sig Dispense Refill  . albuterol (VENTOLIN HFA) 108 (90 Base) MCG/ACT inhaler Inhale 2 puffs into the lungs every 4 (four) hours as needed for wheezing or shortness of  breath. 8 g 12  . fluticasone furoate-vilanterol (BREO ELLIPTA) 200-25 MCG/INH AEPB Inhale 1 puff into the lungs daily. 1 each 5  . furosemide (LASIX) 40 MG tablet Take 1 tablet (40 mg total) by mouth 2 (two) times daily. 60 tablet 12  . ipratropium-albuterol (DUONEB) 0.5-2.5 (3) MG/3ML SOLN Take 3 mLs by nebulization every 6 (six) hours as needed. 360 mL 1  . nicotine (NICODERM CQ - DOSED IN MG/24 HOURS) 14 mg/24hr patch Place 1 patch (14 mg total) onto the skin daily. 28 patch 0  . tiotropium (SPIRIVA HANDIHALER) 18 MCG inhalation capsule Place 1 capsule (18 mcg total) into inhaler and inhale daily. 30 capsule 2   No facility-administered medications prior to visit.     Review of Systems  Constitutional: Positive for activity change, fatigue and unexpected weight change. Negative for fever.  HENT: Negative.   Eyes: Negative.   Respiratory: Positive for chest tightness, shortness of breath and wheezing.   Gastrointestinal: Positive for abdominal distention. Negative for diarrhea, nausea, rectal pain and vomiting.  Genitourinary: Negative.   Musculoskeletal: Negative.   Skin: Negative.        Objective:   Physical Exam Vitals:   03/26/20 1345  BP: 136/89  Pulse: (!) 103  SpO2: (!) 77%  Weight: 120 lb 12.8 oz (54.8 kg)  Height: 5\' 2"  (1.575 m)    Gen: Pleasant, thin, in no distress,  normal affect  ENT: No lesions,  mouth clear,  oropharynx clear, no postnasal drip  Neck: No JVD, no TMG, no carotid bruits  Lungs: No use of accessory muscles, no dullness to percussion, distant breath sounds  Cardiovascular: RRR, heart sounds normal, no murmur or gallops, no peripheral edema  Abdomen: soft and NT, no HSM,  BS normal  Musculoskeletal: No deformities, no cyanosis or clubbing  Neuro: alert, non focal  Skin: Warm, no lesions or rashes  CT of the chest February 21, 2020 FINDINGS: Cardiovascular: There is mild to moderate cardiomegaly. Small amount of pericardial fluid is  present. Mild prominence of the main pulmonary artery segment. No evidence of pulmonary emboli. Thoracic aorta is normal in caliber.  Mediastinum/Nodes: Possible 1.4 cm AP window lymph node and 1.1 cm precarinal lymph node. No significant hilar adenopathy. Remaining mediastinal structures are unremarkable.  Lungs/Pleura: Examination demonstrates centrilobular emphysematous disease.  Linear density over the right base likely atelectasis. Small right pleural effusion is present. Mild linear atelectasis/scarring over the right middle lobe. There are several bilateral subcentimeter pulmonary nodules most subpleural in location with the largest over the lateral right lower lobe measuring 8 mm in greatest diameter (image 101 series 7). Airways are normal.  Upper Abdomen: No acute findings.  Musculoskeletal: Diffuse subcutaneous edema over the chest. Suggestion of mild bilateral symmetric axillary adenopathy. No focal bony abnormality. Bone island over the lower thoracic spine unchanged.  Review of the MIP images confirms the above findings.  IMPRESSION: 1.  No evidence of pulmonary embolism.  2.  Small right pleural effusion with bibasilar atelectasis.  3. Multiple small bilateral subcentimeter pulmonary nodules mostly in subpleural location with the largest measuring 8 mm in greatest dimension over the lateral right lower lobe. Couple possible low-density slightly enlarged mediastinal lymph nodes as well as mild symmetric bilateral axillary adenopathy. Findings may be seen with metastatic disease as recommend clinical correlation and at least follow-up chest CT 3 months.  4. Mild-to-moderate cardiomegaly with small amount of pericardial fluid. Diffuse subcutaneous edema.       Assessment & Plan:  I personally reviewed all images and lab data in the [facility] system as well as any outside material available during this office visit and agree with the  radiology impressions.    Acute diastolic CHF (congestive heart failure) (HCC) Acute diastolic heart failure with echocardiogram showing diastolic dysfunction but no systolic failure.  Moderate to severe pulmonary hypertension also playing a role with significant right ventricular dysfunction  Continue furosemide at 40 mg twice daily and follow-up metabolic panel  Moderate to severe pulmonary hypertension (HCC) Continue oxygen therapy and follow-up per pulmonary medicine  Chronic respiratory failure with hypoxia (HCC) Chronic respiratory failure with hypoxemia  The patient came into the office today without wearing any oxygen was 77% on room air  She immediately increased to the 90+ percent range on 4 L her current dose is 4 L exertion and rest and she has dryness in the nose because of lack of humidification in her home system as well she is requesting to use a facemask intermittently  I will put an order in for a face mask for the patient and also to make sure she has humidification in her home oxygen system as well make sure she is taught how to use the portable system    CKD (chronic kidney disease), stage II Chronic kidney disease will follow-up metabolic panel at this visit  COPD with chronic bronchitis (HCC) Plan to continue Breo and Spiriva daily we will obtain patient assistance for these medications until she obtains Medicaid through her disability application she will use nebulizer as needed as well  Pulmonary nodules Pulmonary nodules historically seen since 2009 and when I reviewed the CT scans are not much different these will be continued to be observed but I suspect are benign  Tobacco abuse Patient given smoking cessation counseling and she is on a nicotine patch and not currently smoking she will maintain the 14 mg daily nicotine patch for now   Luverta was seen today for hospitalization follow-up.  Diagnoses and all orders for this visit:  Chronic respiratory failure with hypoxia  (HCC) -     For home use only DME Other see comment  COPD exacerbation (HCC) -     fluticasone furoate-vilanterol (BREO ELLIPTA) 200-25 MCG/INH AEPB; Inhale 1 puff into the lungs daily. -     albuterol (VENTOLIN HFA)  108 (90 Base) MCG/ACT inhaler; Inhale 2 puffs into the lungs every 4 (four) hours as needed for wheezing or shortness of breath. -     ipratropium-albuterol (DUONEB) 0.5-2.5 (3) MG/3ML SOLN; Take 3 mLs by nebulization every 6 (six) hours as needed. -     For home use only DME Other see comment  Moderate to severe pulmonary hypertension (HCC) -     Comprehensive metabolic panel -     For home use only DME Other see comment  COPD with chronic bronchitis (HCC) -     Comprehensive metabolic panel -     For home use only DME Other see comment  Pulmonary nodules  Tobacco abuse  AKI (acute kidney injury) (HCC) -     Comprehensive metabolic panel  Acute diastolic CHF (congestive heart failure) (HCC)  CKD (chronic kidney disease), stage II  Other orders -     furosemide (LASIX) 40 MG tablet; Take 1 tablet (40 mg total) by mouth 2 (two) times daily. -     tiotropium (SPIRIVA HANDIHALER) 18 MCG inhalation capsule; Place 1 capsule (18 mcg total) into inhaler and inhale daily. -     nicotine (NICODERM CQ - DOSED IN MG/24 HOURS) 14 mg/24hr patch; Place 1 patch (14 mg total) onto the skin daily.

## 2020-03-26 NOTE — Assessment & Plan Note (Signed)
Pulmonary nodules historically seen since 2009 and when I reviewed the CT scans are not much different these will be continued to be observed but I suspect are benign

## 2020-03-26 NOTE — Assessment & Plan Note (Signed)
Patient given smoking cessation counseling and she is on a nicotine patch and not currently smoking she will maintain the 14 mg daily nicotine patch for now

## 2020-03-26 NOTE — Assessment & Plan Note (Signed)
Continue oxygen therapy and follow-up per pulmonary medicine

## 2020-03-26 NOTE — Assessment & Plan Note (Signed)
Plan to continue Breo and Spiriva daily we will obtain patient assistance for these medications until she obtains Medicaid through her disability application she will use nebulizer as needed as well

## 2020-03-26 NOTE — Patient Instructions (Signed)
Continue to avoid tobacco products, use nicotine patch  Stay on Spiriva and Breo inhalers, will obtain patient assistance  A face mask will be provided, a humidifier for the concentrator will be obtained  We will ask home oxygen company to come to the home to teach you how to use your portable oxygen system  Keep your appointment with pulmonary clinic  Obtain a covid vaccine  COVID-19 Vaccine Information can be found at: [email or web address] For questions related to vaccine distribution or appointments, please email [email or web address] or call [phone].    Return to see Dr Delford Field 6 weeks

## 2020-03-26 NOTE — Assessment & Plan Note (Signed)
Acute diastolic heart failure with echocardiogram showing diastolic dysfunction but no systolic failure.  Moderate to severe pulmonary hypertension also playing a role with significant right ventricular dysfunction  Continue furosemide at 40 mg twice daily and follow-up metabolic panel

## 2020-03-26 NOTE — Assessment & Plan Note (Signed)
Chronic kidney disease will follow-up metabolic panel at this visit

## 2020-03-26 NOTE — Assessment & Plan Note (Signed)
Chronic respiratory failure with hypoxemia  The patient came into the office today without wearing any oxygen was 77% on room air  She immediately increased to the 90+ percent range on 4 L her current dose is 4 L exertion and rest and she has dryness in the nose because of lack of humidification in her home system as well she is requesting to use a facemask intermittently  I will put an order in for a face mask for the patient and also to make sure she has humidification in her home oxygen system as well make sure she is taught how to use the portable system

## 2020-03-27 ENCOUNTER — Encounter: Payer: Self-pay | Admitting: Critical Care Medicine

## 2020-03-27 ENCOUNTER — Ambulatory Visit (INDEPENDENT_AMBULATORY_CARE_PROVIDER_SITE_OTHER): Payer: Medicaid Other | Admitting: Critical Care Medicine

## 2020-03-27 VITALS — BP 108/60 | HR 89 | Temp 98.4°F | Ht 62.0 in | Wt 118.6 lb

## 2020-03-27 DIAGNOSIS — R918 Other nonspecific abnormal finding of lung field: Secondary | ICD-10-CM | POA: Diagnosis not present

## 2020-03-27 DIAGNOSIS — Z720 Tobacco use: Secondary | ICD-10-CM

## 2020-03-27 DIAGNOSIS — I272 Pulmonary hypertension, unspecified: Secondary | ICD-10-CM | POA: Diagnosis not present

## 2020-03-27 DIAGNOSIS — J9611 Chronic respiratory failure with hypoxia: Secondary | ICD-10-CM

## 2020-03-27 DIAGNOSIS — J449 Chronic obstructive pulmonary disease, unspecified: Secondary | ICD-10-CM

## 2020-03-27 LAB — COMPREHENSIVE METABOLIC PANEL
ALT: 11 IU/L (ref 0–32)
AST: 14 IU/L (ref 0–40)
Albumin/Globulin Ratio: 1 — ABNORMAL LOW (ref 1.2–2.2)
Albumin: 4.4 g/dL (ref 3.8–4.9)
Alkaline Phosphatase: 96 IU/L (ref 48–121)
BUN/Creatinine Ratio: 12 (ref 9–23)
BUN: 13 mg/dL (ref 6–24)
Bilirubin Total: 0.5 mg/dL (ref 0.0–1.2)
CO2: 21 mmol/L (ref 20–29)
Calcium: 9.5 mg/dL (ref 8.7–10.2)
Chloride: 104 mmol/L (ref 96–106)
Creatinine, Ser: 1.08 mg/dL — ABNORMAL HIGH (ref 0.57–1.00)
GFR calc Af Amer: 69 mL/min/{1.73_m2} (ref >59)
GFR calc non Af Amer: 60 mL/min/{1.73_m2} (ref >59)
Globulin, Total: 4.5 g/dL (ref 1.5–4.5)
Glucose: 85 mg/dL (ref 65–99)
Potassium: 4.6 mmol/L (ref 3.5–5.2)
Sodium: 142 mmol/L (ref 134–144)
Total Protein: 8.9 g/dL — ABNORMAL HIGH (ref 6.0–8.5)

## 2020-03-27 NOTE — Progress Notes (Signed)
Synopsis: Referred in May 2021 for [facility] by Michele Specter, PA-C.  Subjective:   PATIENT ID: Michele Parrish GENDER: female DOB: 11-28-1965, MRN: [id number]  Chief Complaint  Patient presents with   Hospitalization Follow-up    SOB with activity, 4L O2 at all times    [name] is a 54 year old woman with a history of recently diagnosed pulmonary hypertension, chronic hypoxic respiratory failure, COPD who presents for post hospital follow-up.  She was hospitalized from 4/28-5/5 with acute hypoxic respiratory failure, decompensated right heart failure, pulmonary hypertension confirmed on right heart catheterization.  She has been hospitalized in the past several years ago wanted to be discharged without supplemental oxygen.  She had symptoms for several weeks prior to admission with progressively worse edema throughout her entire body, with anasarca as high as her chest wall.  She quit smoking prior to admission to the hospital due to severe shortness of breath and since discharged his smoked about half a cigarette twice.  She no longer has an interest in smoking.  She continues to use nicotine replacement patches.  She has not yet had her Covid vaccine, but would like to do it soon.  She has been using 4 L of oxygen all the time, but per Dr. Lynelle Parrish PCP note yesterday she presented to the clinic without oxygen, saturating in 70s.  She continues to use Breo and Spiriva daily, and has only missed a few doses since hospital discharge.  Evaluation while she was admitted demonstrated normal right upper quadrant ultrasound without evidence of cirrhosis, VQ scan without evidence of CTEPH, negative lower extremity Doppler ultrasounds.  cMRI without late gadolinium enhancement to suggest cardiac sarcoidosis.  CT demonstrated pulmonary nodules and mediastinal adenopathy.  She has no previous history of sarcoidosis.      Past Medical History:  Diagnosis Date   Acute exacerbation of CHF (congestive  heart failure) (HCC) 02/22/2020   Bronchitis    Pulmonary hypertension (HCC)      Family History  Problem Relation Age of Onset   Prostate cancer Father    Stroke Father    Multiple sclerosis Mother      Past Surgical History:  Procedure Laterality Date   ABDOMINAL HYSTERECTOMY     RIGHT HEART CATH N/A 02/23/2020   Procedure: RIGHT HEART CATH;  Surgeon: Michele Ouch, MD;  Location: [facility] INVASIVE [facility];  Service: Cardiovascular;  Laterality: N/A;    Social History   Socioeconomic History   Marital status: Single    Spouse name: Not on file   Number of children: Not on file   Years of education: Not on file   Highest education level: Not on file  Occupational History   Not on file  Tobacco Use   Smoking status: Former Smoker    Packs/day: 1.50    Years: 42.00    Pack years: 63.00    Types: Cigarettes    Quit date: 02/18/2020    Years since quitting: 0.1   Smokeless tobacco: Never Used  Substance and Sexual Activity   Alcohol use: Yes    Comment: 2 beers per day   Drug use: No   Sexual activity: Not on file  Other Topics Concern   Not on file  Social History Narrative   Not on file   Social Determinants of Health   Financial Resource Strain:    Difficulty of Paying Living Expenses:   Food Insecurity:    Worried About Running Out of Food in the Last  Year:    Barista in the Last Year:   Transportation Needs:    Freight forwarder (Medical):    Lack of Transportation (Non-Medical):   Physical Activity:    Days of Exercise per Week:    Minutes of Exercise per Session:   Stress:    Feeling of Stress :   Social Connections:    Frequency of Communication with Friends and Family:    Frequency of Social Gatherings with Friends and Family:    Attends Religious Services:    Active Member of Clubs or Organizations:    Attends Banker Meetings:    Marital Status:   Intimate Partner Violence:    Fear of  Current or Ex-Partner:    Emotionally Abused:    Physically Abused:    Sexually Abused:      No Known Allergies   Immunization History  Administered Date(s) Administered   Influenza,inj,Quad PF,6+ Mos 07/22/2017   Pneumococcal Conjugate-13 07/22/2017    Outpatient Medications Prior to Visit  Medication Sig Dispense Refill   albuterol (VENTOLIN HFA) 108 (90 Base) MCG/ACT inhaler Inhale 2 puffs into the lungs every 4 (four) hours as needed for wheezing or shortness of breath. 8 g 12   fluticasone furoate-vilanterol (BREO ELLIPTA) 200-25 MCG/INH AEPB Inhale 1 puff into the lungs daily. 1 each 5   furosemide (LASIX) 40 MG tablet Take 1 tablet (40 mg total) by mouth 2 (two) times daily. 60 tablet 12   ipratropium-albuterol (DUONEB) 0.5-2.5 (3) MG/3ML SOLN Take 3 mLs by nebulization every 6 (six) hours as needed. 360 mL 1   nicotine (NICODERM CQ - DOSED IN MG/24 HOURS) 14 mg/24hr patch Place 1 patch (14 mg total) onto the skin daily. 28 patch 0   tiotropium (SPIRIVA HANDIHALER) 18 MCG inhalation capsule Place 1 capsule (18 mcg total) into inhaler and inhale daily. 30 capsule 11   No facility-administered medications prior to visit.    Review of Systems  Constitutional: Negative for chills and fever.  Respiratory: Positive for shortness of breath. Negative for cough and wheezing.   Cardiovascular: Negative for chest pain and leg swelling.  Musculoskeletal: Negative.      Objective:   Vitals:   03/27/20 1130  BP: 108/60  Pulse: 89  Temp: 98.4 F (36.9 C)  TempSrc: Oral  SpO2: 93%  Weight: 118 lb 9.6 oz (53.8 kg)  Height: 5\' 2"  (1.575 m)   93% on 4 LPM  BMI Readings from Last 3 Encounters:  03/27/20 21.69 kg/m  03/26/20 22.09 kg/m  02/28/20 21.51 kg/m   Wt Readings from Last 3 Encounters:  03/27/20 118 lb 9.6 oz (53.8 kg)  03/26/20 120 lb 12.8 oz (54.8 kg)  02/28/20 117 lb 9.6 oz (53.3 kg)    Physical Exam Vitals reviewed.  Constitutional:       Appearance: Normal appearance.  HENT:     Head: Normocephalic and atraumatic.  Eyes:     General: No scleral icterus. Cardiovascular:     Rate and Rhythm: Normal rate and regular rhythm.  Pulmonary:     Comments: Breathing comfortably on 4 L nasal cannula, clear to auscultation bilaterally. Abdominal:     General: There is no distension.     Palpations: Abdomen is soft.     Tenderness: There is no abdominal tenderness.  Musculoskeletal:        General: No swelling or deformity.     Cervical back: Neck supple.     Comments: +clubbing  Lymphadenopathy:  Cervical: No cervical adenopathy.  Skin:    General: Skin is warm and dry.     Findings: No rash.  Neurological:     General: No focal deficit present.     Mental Status: She is alert.     Coordination: Coordination normal.  Psychiatric:        Mood and Affect: Mood normal.        Behavior: Behavior normal.      CBC    Component Value Date/Time   WBC 8.6 02/25/2020 0448   RBC 4.52 02/25/2020 0448   HGB 11.4 (L) 02/25/2020 0448   HCT 37.9 02/25/2020 0448   PLT 203 02/25/2020 0448   MCV 83.8 02/25/2020 0448   MCH 25.2 (L) 02/25/2020 0448   MCHC 30.1 02/25/2020 0448   RDW 18.2 (H) 02/25/2020 0448   LYMPHSABS 3.3 02/25/2020 0448   MONOABS 0.6 02/25/2020 0448   EOSABS 0.1 02/25/2020 0448   BASOSABS 0.1 02/25/2020 0448    CHEMISTRY Recent Labs  Lab 03/26/20 1430  NA 142  K 4.6  CL 104  CO2 21  GLUCOSE 85  BUN 13  CREATININE 1.08*  CALCIUM 9.5   Estimated Creatinine Clearance: 48.7 mL/min (A) (by C-G formula based on SCr of 1.08 mg/dL (H)).   Chest Imaging- films reviewed: CXR, 1 view 02/26/2020-Increased interstitial markings bilaterally, cardiomegaly.  V/Q 02/26/2020-low probability VQ, but no ventilation study  cMRI 02/23/20-small underfilled LV, normal thickness with hyperdynamic function, D-shaped interventricular septum consistent with RV pressure & volume overload.  Severely dilated PA.  No late  gadolinium enhancement of the LV.  Cardiac output 5.1 L/min.  02/21/20 CTA chest: No PE. Small R pleural effusion, bibasilar atelectasis. Multiple small bilateral pulmonary nodules <1cm, mostly in subpleural location. Possible low-density enlarged mediastinal lymph nodes, bilaterally axillary adenopathy. Mild cardiomegaly. Small pericardial fluid collection.   Pulmonary Functions Testing Results: No flowsheet data found.    Echocardiogram 02/23/20:  LVEF 60-65%. Grade I DD. Significant trabeculations in RV apex. Moderately reduced RV systolic function. Severely elevated pulmonary artery systolic pressure. RVSP 64 mmHg.  Moderate TR. RAP estimated 55mmHg   Heart Catheterization:   4/30 RHC: Moderately severely elevated right-sided filling pressures with moderate to severe pulmonary hypertension and normal cardiac output RA: 16 mmHg RV: 60 / 11 mmHg Pulmonary capillary wedge pressure: 16 mmHg PA: 66/26 with a mean of 40 mmHg Ao sat 92% PA sat 63% Cardiac output is 4.92 with a cardiac index of 2.86. Pulmonary vascular resistance: 4.9     Assessment & Plan:     ICD-10-CM   1. Tobacco abuse  Z72.0   2. Moderate to severe pulmonary hypertension (HCC)  I27.20 Pulmonary function test    Split night study  3. COPD with chronic bronchitis (HCC)  J44.9 Pulmonary function test    Split night study  4. Pulmonary nodules  R91.8   5. Chronic respiratory failure with hypoxia (HCC)  J96.11   6. Abnormal findings on diagnostic imaging of lung  R91.8 CT Chest Wo Contrast    History of tobacco abuse; successful in quitting for the past month Pulmonary nodules -congratulated her on her success and encouraged her to continue with her efforts -follow up CT scan for nodules in August 2021 to evaluate persistence of nodules  COPD with chronic hypoxic respiratory failure -Continue Breo and Spiriva once daily -She should let us know if she is unable to continue to fill these medications; short-term we  can help her with samples if possible -Appreciate  PCPs office doing patient assistance paperwork. -Needs Covid vaccination.  [facility] appointment [facility] vaccine during her visit. -PFTs -Needs Covid and pneumonia vaccination.  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  Discussed the importance of continuing to use oxygen as her PH is likely to progress with chronic hypoxia. -PSG to rule out OSA.  If this is unable to be performed soon, we will need to perform an ONO to ensure that her oxygen saturations are remaining above 90%. -Follow-up CT after 3 months.  If adenopathy persists, will plan for EBUS to evaluate for sarcoidosis. -Discussed the importance of her remaining vigilant if she develops worsened swelling.  She should let us know so we can be aggressive with managing her PH.   RTC in 1 month.   I spent > 45 minutes on this encounter, including face to face time and non-face to face time spent reviewing records, charting, coordinating care, etc.     Current Outpatient Medications:    albuterol (VENTOLIN HFA) 108 (90 Base) MCG/ACT inhaler, Inhale 2 puffs into the lungs every 4 (four) hours as needed for wheezing or shortness of breath., Disp: 8 g, Rfl: 12   fluticasone furoate-vilanterol (BREO ELLIPTA) 200-25 MCG/INH AEPB, Inhale 1 puff into the lungs daily., Disp: 1 each, Rfl: 5   furosemide (LASIX) 40 MG tablet, Take 1 tablet (40 mg total) by mouth 2 (two) times daily., Disp: 60 tablet, Rfl: 12   ipratropium-albuterol (DUONEB) 0.5-2.5 (3) MG/3ML SOLN, Take 3 mLs by nebulization every 6 (six) hours as needed., Disp: 360 mL, Rfl: 1   nicotine (NICODERM CQ - DOSED IN MG/24 HOURS) 14 mg/24hr patch, Place 1 patch (14 mg total) onto the skin daily., Disp: 28 patch, Rfl: 0   tiotropium (SPIRIVA HANDIHALER) 18 MCG inhalation capsule, Place 1 capsule (18 mcg total) into inhaler and  inhale daily., Disp: 30 capsule, Rfl: 11    Steffanie Dunn, DO [facility] 03/27/2020 12:52 PM

## 2020-03-27 NOTE — Patient Instructions (Addendum)
Thank you for visiting Dr. Chestine Spore at [facility]. We recommend the following: Orders Placed This Encounter  Procedures  . Pulmonary function test  . Split night study   Orders Placed This Encounter  Procedures  . Pulmonary function test    Standing Status:   Future    Standing Expiration Date:   03/27/2021    Order Specific Question:   Where should this test be performed?    Answer:   [facility]    Order Specific Question:   Full PFT: includes the following: basic spirometry, spirometry pre & post bronchodilator, diffusion capacity (DLCO), lung volumes    Answer:   Full PFT  . Split night study    Standing Status:   Future    Standing Expiration Date:   03/27/2021    Order Specific Question:   Where should this test be performed:    Answer:   [facility]   Stay on Breo and Spiriva every day. Use these every day regardless of how you are feeling. Use albuterol as needed for worse symptoms than normal.     Return in about 4 weeks (around 04/24/2020).    Covid vaccine: Today: first dose @ 3pm [facility]  [address] [place], [place] [address]  Second dose: Wednesday, April 17, 2020 at 03:00 PM EDT [facility]  [address] [place], [place] [address]   Please do your part to reduce the spread of COVID-19.

## 2020-04-13 ENCOUNTER — Other Ambulatory Visit (HOSPITAL_COMMUNITY)
Admission: RE | Admit: 2020-04-13 | Discharge: 2020-04-13 | Disposition: A | Discharge status: Home / Self Care | Payer: Medicaid Other | Source: Ambulatory Visit | Attending: Pulmonary Disease | Admitting: Pulmonary Disease

## 2020-04-13 DIAGNOSIS — Z01812 Encounter for preprocedural laboratory examination: Secondary | ICD-10-CM | POA: Diagnosis not present

## 2020-04-13 DIAGNOSIS — Z20822 Contact with and (suspected) exposure to covid-19: Secondary | ICD-10-CM | POA: Diagnosis not present

## 2020-04-13 LAB — SARS CORONAVIRUS 2 (TAT 6-24 HRS): SARS Coronavirus 2: NEGATIVE

## 2020-04-16 ENCOUNTER — Other Ambulatory Visit: Payer: Self-pay

## 2020-04-16 ENCOUNTER — Ambulatory Visit (HOSPITAL_BASED_OUTPATIENT_CLINIC_OR_DEPARTMENT_OTHER): Payer: Medicaid Other | Attending: Critical Care Medicine | Admitting: Pulmonary Disease

## 2020-04-16 DIAGNOSIS — J449 Chronic obstructive pulmonary disease, unspecified: Secondary | ICD-10-CM | POA: Diagnosis not present

## 2020-04-16 DIAGNOSIS — I272 Pulmonary hypertension, unspecified: Secondary | ICD-10-CM | POA: Diagnosis not present

## 2020-04-24 DIAGNOSIS — I272 Pulmonary hypertension, unspecified: Secondary | ICD-10-CM

## 2020-04-24 DIAGNOSIS — J449 Chronic obstructive pulmonary disease, unspecified: Secondary | ICD-10-CM | POA: Diagnosis not present

## 2020-04-24 DIAGNOSIS — G4736 Sleep related hypoventilation in conditions classified elsewhere: Secondary | ICD-10-CM | POA: Diagnosis not present

## 2020-04-24 NOTE — Procedures (Signed)
    Patient Name: Michele Parrish, Michele Parrish Date: 04/16/2020 Gender: Female D.O.B: 01/30/1966 Age (years): 53 Referring Provider: Karie Fetch DO Height (inches): 62 Interpreting Physician: Coralyn Helling MD, ABSM Weight (lbs): 120 RPSGT: Shelah Lewandowsky BMI: 22 MRN: [id number] Neck Size: 14.00  CLINICAL INFORMATION Sleep Study Type: NPSG  Indication for sleep study: 54 year old with history of COPD, chronic hypoxic respiratory failure, and pulmonary hypertension.  She is referred to sleep lab to assess for presence of obstructive sleep apnea.  Epworth Sleepiness Score: 3  SLEEP STUDY TECHNIQUE As per the AASM Manual for the Scoring of Sleep and Associated Events v2.3 (April 2016) with a hypopnea requiring 4% desaturations.  The channels recorded and monitored were frontal, central and occipital EEG, electrooculogram (EOG), submentalis EMG (chin), nasal and oral airflow, thoracic and abdominal wall motion, anterior tibialis EMG, snore microphone, electrocardiogram, and pulse oximetry.  MEDICATIONS Medications self-administered by patient taken the night of the study : N/A  SLEEP ARCHITECTURE The study was initiated at 10:48:59 PM and ended at 5:13:26 AM.  Sleep onset time was 42.7 minutes and the sleep efficiency was 77.1%%. The total sleep time was 296.2 minutes.  Stage REM latency was 3.5 minutes.  The patient spent 5.3%% of the night in stage N1 sleep, 69.7%% in stage N2 sleep, 0.0%% in stage N3 and 25% in REM.  Alpha intrusion was absent.  Supine sleep was 48.06%.  RESPIRATORY PARAMETERS The overall apnea/hypopnea index (AHI) was 0.2 per hour. There were 0 total apneas, including 0 obstructive, 0 central and 0 mixed apneas. There were 1 hypopneas and 2 RERAs.  The AHI during Stage REM sleep was 0.0 per hour.  AHI while supine was 0.0 per hour.  The mean oxygen saturation was 88.3%. The minimum SpO2 during sleep was 82.0%.  The study was conducted with her using 4  liters supplemental oxygen.  snoring was noted during this study.  CARDIAC DATA The 2 lead EKG demonstrated sinus rhythm. The mean heart rate was 85.5 beats per minute. Other EKG findings include: None.  LEG MOVEMENT DATA The total PLMS were 0 with a resulting PLMS index of 0.0. Associated arousal with leg movement index was 0.0 .  IMPRESSIONS - No significant obstructive or central sleep apnea.  Her overall AHI was 0.2 events per hour of sleep. - SpO2 low was 82%.  She wore 4 liters supplemental oxygen during the study.  DIAGNOSIS - Nocturnal Hypoxemia (327.26 [G47.36 ICD-10])  RECOMMENDATIONS - Continue 4 liters supplemental oxygen.  [Electronically signed] 04/24/2020 10:20 AM  Coralyn Helling MD, ABSM Diplomate, American Board of Sleep Medicine   NPI: [id number]

## 2020-04-25 ENCOUNTER — Other Ambulatory Visit: Payer: Self-pay

## 2020-04-25 ENCOUNTER — Encounter: Payer: Self-pay | Admitting: Critical Care Medicine

## 2020-04-25 ENCOUNTER — Ambulatory Visit (INDEPENDENT_AMBULATORY_CARE_PROVIDER_SITE_OTHER): Payer: Medicaid Other | Admitting: Critical Care Medicine

## 2020-04-25 VITALS — BP 104/80 | HR 93 | Temp 98.1°F | Ht 62.0 in | Wt 127.0 lb

## 2020-04-25 DIAGNOSIS — J9611 Chronic respiratory failure with hypoxia: Secondary | ICD-10-CM

## 2020-04-25 DIAGNOSIS — I272 Pulmonary hypertension, unspecified: Secondary | ICD-10-CM

## 2020-04-25 DIAGNOSIS — R591 Generalized enlarged lymph nodes: Secondary | ICD-10-CM

## 2020-04-25 DIAGNOSIS — J449 Chronic obstructive pulmonary disease, unspecified: Secondary | ICD-10-CM | POA: Diagnosis not present

## 2020-04-25 DIAGNOSIS — R599 Enlarged lymph nodes, unspecified: Secondary | ICD-10-CM

## 2020-04-25 NOTE — Patient Instructions (Addendum)
Thank you for visiting Dr. Chestine Spore at [facility]. We recommend the following: Orders Placed This Encounter  Procedures  . CT Chest W Contrast  . B Nat Peptide  . Basic Metabolic Panel (BMET)  . Pulse oximetry, overnight   Orders Placed This Encounter  Procedures  . CT Chest W Contrast    Late July 2021    Standing Status:   Future    Standing Expiration Date:   04/25/2021    Order Specific Question:   ** REASON FOR EXAM (FREE TEXT)    Answer:   evaluate for ongoing adenopathy    Order Specific Question:   If indicated for the ordered procedure, I authorize the administration of contrast media per Radiology protocol    Answer:   Yes    Order Specific Question:   Is patient pregnant?    Answer:   No    Order Specific Question:   Preferred imaging location?    Answer:   [facility]    Order Specific Question:   Radiology Contrast Protocol - do NOT remove file path    Answer:   [email or web address]  . B Nat Peptide    Standing Status:   Future    Standing Expiration Date:   04/25/2021  . Basic Metabolic Panel (BMET)    Standing Status:   Future    Standing Expiration Date:   04/25/2021  . Pulse oximetry, overnight    Perform test on 5L O2    Standing Status:   Future    Standing Expiration Date:   04/25/2021      Turn oxygen up to 5L whenever you are sleeping. We are ordering an overnight oximetry to recheck your oxygen levels when you are sleeping.  Keep taking lasix 2 times per day, 6 hours apart. You can take it at noon when you wake up and sometime between 6 pm and midnight.  Use your oxygen all the time.  Return in about 2 months (around 06/26/2020).    Please do your part to reduce the spread of COVID-19.

## 2020-04-25 NOTE — Progress Notes (Signed)
Synopsis: Referred in May 2021 for [facility] by Storm Frisk, MD.  Subjective:   PATIENT ID: Michele Parrish GENDER: female DOB: 1965-11-07, MRN: [id number]  Chief Complaint  Patient presents with  . Follow-up    Patient wears 4 liters oxygen all the time. Patient feels the same as list visit. Productive cough but does not spit it up/out    Michele Parrish is a 54 year old woman with a history of chronic hypoxic respiratory failure, pulmonary hypertension, COPD who presents for follow-up.  She is accompanied today by her daughter.  She has been using oxygen more frequently since her last visit.  She continues to not use it when she is bathing due to fear that she will get tangled up with them in her small bathroom, causing her to fall.  She has been taking her Lasix 40 mg 1-2 times per day, usually alternating days.  She has gained 7 pounds since her last visit, but her close to fit and she has not noticed edema.  She does not take it twice daily every day because she does not like having to go to the bathroom frequently.  No early satiety, change in appetite, chest pain.  She has occasional productive cough but no wheezing.  She remains on Breo and Spiriva once daily.  She is used albuterol 3 times since her last visit.  She missed her follow-up visit with cardiology, but has followed up with Dr. Delford Field.  She generally has quit smoking, but occasionally smokes a portion of the cigarette, never used when using her oxygen.  Her home concentrator is to 5 L max, and she always uses her 4 L when sleeping.    OV 03/27/20: Ms. Titsworth is a 54 year old woman with a history of recently diagnosed pulmonary hypertension, chronic hypoxic respiratory failure, COPD who presents for post hospital follow-up.  She was hospitalized from 4/28-5/5 with acute hypoxic respiratory failure, decompensated right heart failure, pulmonary hypertension confirmed on right heart catheterization.  She has been hospitalized in the past  several years ago wanted to be discharged without supplemental oxygen.  She had symptoms for several weeks prior to admission with progressively worse edema throughout her entire body, with anasarca as high as her chest wall.  She quit smoking prior to admission to the hospital due to severe shortness of breath and since discharged his smoked about half a cigarette twice.  She no longer has an interest in smoking.  She continues to use nicotine replacement patches.  She has not yet had her Covid vaccine, but would like to do it soon.  She has been using 4 L of oxygen all the time, but per Dr. Lynelle Doctor PCP note yesterday she presented to the clinic without oxygen, saturating in 70s.  She continues to use Breo and Spiriva daily, and has only missed a few doses since hospital discharge.  Evaluation while she was admitted demonstrated normal right upper quadrant ultrasound without evidence of cirrhosis, VQ scan without evidence of CTEPH, negative lower extremity Doppler ultrasounds.  cMRI without late gadolinium enhancement to suggest cardiac sarcoidosis.  CT demonstrated pulmonary nodules and mediastinal adenopathy.  She has no previous history of sarcoidosis.    Past Medical History:  Diagnosis Date  . Acute exacerbation of CHF (congestive heart failure) (HCC) 02/22/2020  . Bronchitis   . Pulmonary hypertension (HCC)      Family History  Problem Relation Age of Onset  . Prostate cancer Father   . Stroke Father   .  Multiple sclerosis Mother      Past Surgical History:  Procedure Laterality Date  . ABDOMINAL HYSTERECTOMY    . RIGHT HEART CATH N/A 02/23/2020   Procedure: RIGHT HEART CATH;  Surgeon: Iran Ouch, MD;  Location: [facility];  Service: Cardiovascular;  Laterality: N/A;    Social History   Socioeconomic History  . Marital status: Single    Spouse name: Not on file  . Number of children: Not on file  . Years of education: Not on file  . Highest education level: Not  on file  Occupational History  . Not on file  Tobacco Use  . Smoking status: Former Smoker    Packs/day: 1.50    Years: 42.00    Pack years: 63.00    Types: Cigarettes    Quit date: 02/18/2020    Years since quitting: 0.1  . Smokeless tobacco: Never Used  Vaping Use  . Vaping Use: Never used  Substance and Sexual Activity  . Alcohol use: Yes    Comment: 2 beers per day  . Drug use: No  . Sexual activity: Not on file  Other Topics Concern  . Not on file  Social History Narrative  . Not on file   Social Determinants of Health   Financial Resource Strain:   . Difficulty of Paying Living Expenses:   Food Insecurity:   . Worried About Programme researcher, broadcasting/film/video in the Last Year:   . Barista in the Last Year:   Transportation Needs:   . Freight forwarder (Medical):   Marland Kitchen Lack of Transportation (Non-Medical):   Physical Activity:   . Days of Exercise per Week:   . Minutes of Exercise per Session:   Stress:   . Feeling of Stress :   Social Connections:   . Frequency of Communication with Friends and Family:   . Frequency of Social Gatherings with Friends and Family:   . Attends Religious Services:   . Active Member of Clubs or Organizations:   . Attends Banker Meetings:   Marland Kitchen Marital Status:   Intimate Partner Violence:   . Fear of Current or Ex-Partner:   . Emotionally Abused:   Marland Kitchen Physically Abused:   . Sexually Abused:      No Known Allergies   Immunization History  Administered Date(s) Administered  . Influenza,inj,Quad PF,6+ Mos 07/22/2017  . PFIZER SARS-COV-2 Vaccination 03/27/2020, 04/17/2020  . Pneumococcal Conjugate-13 07/22/2017    Outpatient Medications Prior to Visit  Medication Sig Dispense Refill  . albuterol (VENTOLIN HFA) 108 (90 Base) MCG/ACT inhaler Inhale 2 puffs into the lungs every 4 (four) hours as needed for wheezing or shortness of breath. 8 g 12  . fluticasone furoate-vilanterol (BREO ELLIPTA) 200-25 MCG/INH AEPB Inhale 1  puff into the lungs daily. 1 each 5  . furosemide (LASIX) 40 MG tablet Take 1 tablet (40 mg total) by mouth 2 (two) times daily. 60 tablet 12  . ipratropium-albuterol (DUONEB) 0.5-2.5 (3) MG/3ML SOLN Take 3 mLs by nebulization every 6 (six) hours as needed. 360 mL 1  . nicotine (NICODERM CQ - DOSED IN MG/24 HOURS) 14 mg/24hr patch Place 1 patch (14 mg total) onto the skin daily. 28 patch 0  . tiotropium (SPIRIVA HANDIHALER) 18 MCG inhalation capsule Place 1 capsule (18 mcg total) into inhaler and inhale daily. 30 capsule 11   No facility-administered medications prior to visit.    Review of Systems  Constitutional: Negative for chills and  fever.  Respiratory: Positive for shortness of breath. Negative for cough and wheezing.   Cardiovascular: Negative for chest pain and leg swelling.  Musculoskeletal: Negative.      Objective:   Vitals:   04/25/20 1625  BP: 104/80  Pulse: 93  Temp: 98.1 F (36.7 C)  TempSrc: Oral  SpO2: 97%  Weight: 127 lb (57.6 kg)  Height:  (1.575 m)   97% on 4 LPM  BMI Readings from Last 3 Encounters:  04/25/20 23.23 kg/m  04/16/20 21.95 kg/m  03/27/20 21.69 kg/m   Wt Readings from Last 3 Encounters:  04/25/20 127 lb (57.6 kg)  04/16/20 120 lb (54.4 kg)  03/27/20 118 lb 9.6 oz (53.8 kg)    Physical Exam Vitals reviewed.  Constitutional:      General: She is not in acute distress.    Appearance: She is not ill-appearing.  HENT:     Head: Normocephalic and atraumatic.  Eyes:     General: No scleral icterus. Cardiovascular:     Rate and Rhythm: Normal rate and regular rhythm.     Comments: Prominent P2, JVD 5 cm above clavicle when upright. Dilated arm veins. Pulmonary:     Comments: Breathing comfortably on 4 L, no conversational dyspnea.  Clear to auscultation bilaterally. Abdominal:     General: There is no distension.     Palpations: Abdomen is soft.     Tenderness: There is no abdominal tenderness.  Musculoskeletal:         General: No swelling or deformity.     Cervical back: Neck supple.  Lymphadenopathy:     Cervical: No cervical adenopathy.  Skin:    General: Skin is warm and dry.     Findings: No rash.  Neurological:     General: No focal deficit present.     Mental Status: She is alert.     Coordination: Coordination normal.  Psychiatric:        Mood and Affect: Mood normal.        Behavior: Behavior normal.      CBC    Component Value Date/Time   WBC 8.6 02/25/2020 0448   RBC 4.52 02/25/2020 0448   HGB 11.4 (L) 02/25/2020 0448   HCT 37.9 02/25/2020 0448   PLT 203 02/25/2020 0448   MCV 83.8 02/25/2020 0448   MCH 25.2 (L) 02/25/2020 0448   MCHC 30.1 02/25/2020 0448   RDW 18.2 (H) 02/25/2020 0448   LYMPHSABS 3.3 02/25/2020 0448   MONOABS 0.6 02/25/2020 0448   EOSABS 0.1 02/25/2020 0448   BASOSABS 0.1 02/25/2020 0448    CHEMISTRY No results for input(s): NA, K, CL, CO2, GLUCOSE, BUN, CREATININE, CALCIUM, MG, PHOS in the last 168 hours. CrCl cannot be calculated (Patient's most recent lab result is older than the maximum 21 days allowed.).   Chest Imaging- films reviewed: CXR, 1 view 02/26/2020-Increased interstitial markings bilaterally, cardiomegaly.  V/Q 02/26/2020-low probability VQ, but no ventilation study  cMRI 02/23/20-small underfilled LV, normal thickness with hyperdynamic function, D-shaped interventricular septum consistent with RV pressure & volume overload.  Severely dilated PA.  No late gadolinium enhancement of the LV.  Cardiac output 5.1 L/min.  02/21/20 CTA chest: No PE. Small R pleural effusion, bibasilar atelectasis. Multiple small bilateral pulmonary nodules <1cm, mostly in subpleural location. Possible low-density enlarged mediastinal lymph nodes, bilaterally axillary adenopathy. Mild cardiomegaly. Small pericardial fluid collection.   Pulmonary Functions Testing Results: No flowsheet data found.    Echocardiogram 02/23/20:  LVEF 60-65%. Grade I  DD. Significant  trabeculations in RV apex. Moderately reduced RV systolic function. Severely elevated pulmonary artery systolic pressure. RVSP 64 mmHg.  Moderate TR. RAP estimated 15mmHg   Heart Catheterization:   4/30 RHC: Moderately severely elevated right-sided filling pressures with moderate to severe pulmonary hypertension and normal cardiac output RA: 16 mmHg RV: 60 / 11 mmHg Pulmonary capillary wedge pressure: 16 mmHg PA: 66/26 with a mean of 40 mmHg Ao sat 92% PA sat 63% Cardiac output is 4.92 with a cardiac index of 2.86. Pulmonary vascular resistance: 4.9    PSG 04/16/20: AHI 0.2, SpO2 low 82% on 4L O2    Assessment & Plan:     ICD-10-CM   1. Chronic obstructive pulmonary disease, unspecified COPD type (HCC)  J44.9 B Nat Peptide    Pulse oximetry, overnight  2. Chronic respiratory failure with hypoxia (HCC)  J96.11 B Nat Peptide    Pulse oximetry, overnight    CT Chest W Contrast    Basic Metabolic Panel (BMET)  3. Moderate to severe pulmonary hypertension (HCC)  I27.20 B Nat Peptide    Pulse oximetry, overnight    CT Chest W Contrast    Basic Metabolic Panel (BMET)  4. Adenopathy  R59.1 CT Chest W Contrast    Basic Metabolic Panel (BMET)    History of tobacco abuse; successful in quitting for the past 1-2 months Pulmonary nodules Mediastinal adenopahty -Congratulated her on her success and encouraged ongoing cessation -Reminded her that she should always avoid being around lit cigarettes when her oxygen is on. -Follow up CT scan for nodules in July 2021 to evaluate persistence of nodules. BMP for CT with contrast.  COPD with chronic hypoxic respiratory failure Nocturnal hypoxia uncontrolled- desat to 82% on 4L -Continue Breo and Spiriva once daily.  Appreciate PCPs office doing patient assistance paperwork. -Continue mask wearing, avoid sick contacts -PFTs still needed -Needs pneumonia vaccination. UTD on covid vaccines.  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  Discussed the importance of continuing to use oxygen as her PH is likely to progress with uncontrolled hypoxia. Encouraged constant compliance, especially with bathing when she has been staying off it for several minutes.  -PSG reviewed. Recommend increasing oxygen when sleeping to 5L; will order ONO on 5L (the max her concentrator goes to). --66102-month follow-up CT in July.  If adenopathy persists, will plan for EBUS to evaluate for sarcoidosis.  -Discussed the importance of her remaining vigilant if she develops worsened swelling. I am concerned with her 7# weight gain with noncompliance with diuretics. She should let us know so we can be aggressive with managing her PH. -BNP level today -I sent a message to Dr. Antoine PocheHochrein to let him know that her cardiology visit should be rescheduled.   RTC in 2 months.   I spent > 45 minutes on this encounter, including face to face time and non-face to face time spent reviewing records, charting, coordinating care, etc.     Current Outpatient Medications:  .  albuterol (VENTOLIN HFA) 108 (90 Base) MCG/ACT inhaler, Inhale 2 puffs into the lungs every 4 (four) hours as needed for wheezing or shortness of breath., Disp: 8 g, Rfl: 12 .  fluticasone furoate-vilanterol (BREO ELLIPTA) 200-25 MCG/INH AEPB, Inhale 1 puff into the lungs daily., Disp: 1 each, Rfl: 5 .  furosemide (LASIX) 40 MG tablet, Take 1 tablet (40 mg total) by mouth 2 (two)  times daily., Disp: 60 tablet, Rfl: 12 .  ipratropium-albuterol (DUONEB) 0.5-2.5 (3) MG/3ML SOLN, Take 3 mLs by nebulization every 6 (six) hours as needed., Disp: 360 mL, Rfl: 1 .  nicotine (NICODERM CQ - DOSED IN MG/24 HOURS) 14 mg/24hr patch, Place 1 patch (14 mg total) onto the skin daily., Disp: 28 patch, Rfl: 0 .  tiotropium (SPIRIVA HANDIHALER) 18 MCG inhalation capsule, Place 1 capsule (18 mcg total) into inhaler and inhale  daily., Disp: 30 capsule, Rfl: 11    Steffanie Dunn, DO [facility] 04/25/2020 5:04 PM

## 2020-05-08 MED FILL — $BREO ELLIPTA 200-25 MCG IN: 200-25 | 90 days supply | Qty: 180 | Fill #1

## 2020-05-09 ENCOUNTER — Telehealth: Payer: Self-pay | Admitting: Critical Care Medicine

## 2020-05-09 NOTE — Telephone Encounter (Signed)
Called and spoke with pt who stated when trying to do the ONO 7/14, she stated that it seemed like the machine was needing batteries due to it cutting off on her. Pt stated she contacted [employer] and they were going to send her another machine to be able to get it done. Pt is going to be completing the ONO tonight 7/15. Nothing further needed.

## 2020-05-10 ENCOUNTER — Encounter: Payer: Self-pay | Admitting: Critical Care Medicine

## 2020-05-21 ENCOUNTER — Other Ambulatory Visit: Payer: Self-pay

## 2020-05-21 ENCOUNTER — Encounter: Payer: Self-pay | Admitting: Critical Care Medicine

## 2020-05-21 ENCOUNTER — Ambulatory Visit: Payer: Medicaid Other | Attending: Critical Care Medicine | Admitting: Critical Care Medicine

## 2020-05-21 VITALS — BP 107/73 | HR 94 | Temp 97.6°F | Resp 19 | Ht 62.4 in | Wt 132.0 lb

## 2020-05-21 DIAGNOSIS — J9611 Chronic respiratory failure with hypoxia: Secondary | ICD-10-CM | POA: Diagnosis not present

## 2020-05-21 DIAGNOSIS — I5031 Acute diastolic (congestive) heart failure: Secondary | ICD-10-CM

## 2020-05-21 DIAGNOSIS — J449 Chronic obstructive pulmonary disease, unspecified: Secondary | ICD-10-CM | POA: Diagnosis not present

## 2020-05-21 DIAGNOSIS — F17210 Nicotine dependence, cigarettes, uncomplicated: Secondary | ICD-10-CM | POA: Diagnosis not present

## 2020-05-21 DIAGNOSIS — J4489 Other specified chronic obstructive pulmonary disease: Secondary | ICD-10-CM

## 2020-05-21 DIAGNOSIS — I272 Pulmonary hypertension, unspecified: Secondary | ICD-10-CM | POA: Diagnosis not present

## 2020-05-21 DIAGNOSIS — Z720 Tobacco use: Secondary | ICD-10-CM

## 2020-05-21 DIAGNOSIS — R918 Other nonspecific abnormal finding of lung field: Secondary | ICD-10-CM | POA: Diagnosis not present

## 2020-05-21 NOTE — Assessment & Plan Note (Addendum)
Continue DuoNeb as needed along with Breo inhaler and Spiriva daily  Patient has had a Pneumovax in 2018 does not need repeated as of yet  Patient needs pulmonary functions as of yet to be achieved I will reorder these

## 2020-05-21 NOTE — Assessment & Plan Note (Signed)
Based on sleep study patient needs 5 L at night and 4 L with exertion during the day with oxygen

## 2020-05-21 NOTE — Patient Instructions (Signed)
No change in medications Stay on oxygen 4Liter rest 5Liter exertion  A lung function test will be scheduled at [facility]  Keep CT Chest scheduled appointment  Return Dr Delford Field 3 months

## 2020-05-21 NOTE — Progress Notes (Signed)
Subjective:    Patient ID: Michele Parrish, female    DOB: April 14, 1966, 54 y.o.   MRN: [id number]   54 y.o.F s/p hosp stay for COPD exac and hypoxic RF in April.  Here to re est care and HFU. Adept home health.  Missed last post hosp visit  The patient is here in the office today wishing to establish for primary care after hospitalization for COPD exacerbation, pulmonary hypertension, anasarca, severe chronic hypoxic respiratory failure.  The patient initially was admitted between 28 April and 5 May with severe COPD exacerbation and significant pulmonary hypertension and severe hypoxemia.  Patient initially was on 80% saturation on room air.  Despite high flow oxygen she was difficult to oxygenate but after significant diuresis patient's oxygenation did improve.  Below is the discharge summary Admit date: 02/21/2020 Discharge date: 02/28/2020  Admitted From: Home  Disposition:  Home with O2   Recommendations for Outpatient Follow-up:  1. Follow up with Pulmonology 2. Follow up with Cardiology 3. Follow up with new PCP Dr. Delford Field 4. Dr. Delford Field please follow-up pulmonary nodules 5. Outpatient sleep study per pulmonology   Home Health: None  Equipment/Devices: O2  Discharge Condition: Fair  CODE STATUS: FULL Diet recommendation: Regular  Brief/Interim Summary: Michele Parrish is a 54 y.o. F with hx chronic bronchitis, poor medical follow up, smoking who presented with 2 weeks progressive shortness of breath, orthopnea, increased abdominal girth, leg swelling.  In the ER she was saturating 80% on room air.  Started on IV Lasix and admitted.  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  Echocardiogram showed normal EF, grade 1 diastolic dysfunction, but right  ventricular dysfunction.  Cardiology was called to see the patient, who underwent right heart cath which showed severe pulmonary hypertension.  Pulmonology were consulted. They noted severe COPD, pulmonary nodule suggestive of sarcoidosis, and severe pulmonary hypertension.    The patient gradually was weaned down from high flow nasal cannula to 4 L supplemental oxygen.  She was diuresed 36 liters on admission.  She has follow-up with pulmonology, who recommended bronchodilators, supplemental oxygen 24/7.    CKD stage II Creatinine stabilized around 1.1  Multiple pulmonary nodules -Repeat CT scan in 3 months -Follow-up with pulmonology  Moderate protein calorie malnutrition As evidenced by weight loss, BMI 21, and albumin 2.7.  Smoking Smoking cessation recommended, modalities discussed  Below is a transition of care RN note on May 6  TCM RN note 02/29/20:   Hospital Discharge Telephone Follow up  Date of discharge and from where: 02/28/2020 from Michele Parrish  How have you been since you were released from the hospital? [facility] better than in the hospital Any questions or concerns? Yes about prescribed  nicotine patch/ stated were not given. [name], CM RN, called  [facility]le Par[facility] pharmacy and was informed that the patches were OTC and were 42$, and that she did not have any money .She stated that the other 2 meds were free and was told that Nicotine patch  do not have it.  [facility] pharmacy transferred prescription at a lower cost/  Notified pt that she could pick them up at [facility] . Patient aware of cost and  Agreed.  Items Reviewed:  Did the pt receive and understand the discharge instructions provided? YES   Medications obtained and  verified? But nicotine patches will pick them up today or tomorrow.   Any new allergies since your discharge? NONE  Dietary orders reviewed? YES   Do you have support at home?Lives by herself / asks friends to help  sometimes  Functional Questionnaire: (I = Independent and D = Dependent) ADLs: I  Follow up appointments reviewed:   PCP Hospital f/u appt confirmed?   Scheduled to see Dr Shan Levans on 03/11/2020 @ 9:00 am . Stated she asked a friend to bring her to bring her to the appt.   Specialist Hospital f/u appt confirmed?  Scheduled to see Cardiology and Pulmonary  on 05/10/2021and 03/27/2020   Are transportation arrangements needed? Stated she will ask her friend to drive or take public transportation. Offered buss pass if needed.    If their condition worsens, is the pt aware to call PCP or go to the Emergency Dept.? YES made pt aware of all signs and symptoms needed to watch for as per MD discharge  Instructions.  Was the patient provided with contact information for the PCP's office or ED? YES   Was to pt encouraged to call back with questions or concerns? YES Call back nr and name was provided .  Reinforced Compliance with prescribed meds and to stay off of cigarettes. Stressed the importance  to record of daily weight. Verbalized understanding DME Pt on 4L O2 viaNC for ambulation supplied by [facility]. Has 4 full tanks at home. Stated she has their contact name and phone nr . Instructed to call us and let us know if she can not reach them for additional supply.  Stated no other  home health services were provided at this time.   Pt failed f/u visit in May.  Since discharge the patient's breathing is better but she states the nasal cannula dries her nose out and she prefers a mask or cannula.  Also note there is no humidity in the home attached to the concentrator.  As well the patient states no one is shown her how to use her portable oxygen system.  The patient states she does have less edema in the feet not as much cough no chest pain no wheezing  05/21/2020 Patient was seen in return follow-up has severe COPD and significant pulmonary hypertension has been seen by pulmonary  medicine.  The patient underwent sleep study as well which showed no significant obstructive or central apnea however she desaturated and needed 4 L of oxygen during the study now she is taking 5 L at night and 4 L during the day  Unfortunately as she is uninsured and cannot afford a lightweight system She has quit smoking which is significantly improving her status.  She states her breathing is at baseline.  She does have a follow-up CT scan this week to evaluate the presence of the nodules in her lungs.  She needs a pulmonary function test but is yet to achieve this.  Patient continue Spiriva daily  The patient also successfully achieved a Covid vaccine  Due to the patient's medical status she is not a candidate for colonoscopy, mammography, or Pap smear  She is also not a candidate for tetanus vaccine at this time  Past Medical History:  Diagnosis Date  . Acute exacerbation of CHF (congestive heart failure) (HCC) 02/22/2020  . Bronchitis   . Pulmonary hypertension (HCC)      Family History  Problem Relation Age of Onset  . Prostate cancer Father   . Stroke Father   .  Multiple sclerosis Mother      Social History   Socioeconomic History  . Marital status: Single    Spouse name: Not on file  . Number of children: Not on file  . Years of education: Not on file  . Highest education level: Not on file  Occupational History  . Not on file  Tobacco Use  . Smoking status: Former Smoker    Packs/day: 1.50    Years: 42.00    Pack years: 63.00    Types: Cigarettes    Quit date: 02/18/2020    Years since quitting: 0.2  . Smokeless tobacco: Never Used  Vaping Use  . Vaping Use: Never used  Substance and Sexual Activity  . Alcohol use: Yes    Comment: 2 beers per day  . Drug use: No  . Sexual activity: Not on file  Other Topics Concern  . Not on file  Social History Narrative  . Not on file   Social Determinants of Health   Financial Resource Strain:   . Difficulty of  Paying Living Expenses:   Food Insecurity:   . Worried About Programme researcher, broadcasting/film/video in the Last Year:   . Barista in the Last Year:   Transportation Needs:   . Freight forwarder (Medical):   Marland Kitchen Lack of Transportation (Non-Medical):   Physical Activity:   . Days of Exercise per Week:   . Minutes of Exercise per Session:   Stress:   . Feeling of Stress :   Social Connections:   . Frequency of Communication with Friends and Family:   . Frequency of Social Gatherings with Friends and Family:   . Attends Religious Services:   . Active Member of Clubs or Organizations:   . Attends Banker Meetings:   Marland Kitchen Marital Status:   Intimate Partner Violence:   . Fear of Current or Ex-Partner:   . Emotionally Abused:   Marland Kitchen Physically Abused:   . Sexually Abused:      No Known Allergies   Outpatient Medications Prior to Visit  Medication Sig Dispense Refill  . fluticasone furoate-vilanterol (BREO ELLIPTA) 200-25 MCG/INH AEPB Inhale 1 puff into the lungs daily. 1 each 5  . furosemide (LASIX) 40 MG tablet Take 1 tablet (40 mg total) by mouth 2 (two) times daily. 60 tablet 12  . ipratropium-albuterol (DUONEB) 0.5-2.5 (3) MG/3ML SOLN Take 3 mLs by nebulization every 6 (six) hours as needed. 360 mL 1  . nicotine (NICODERM CQ - DOSED IN MG/24 HOURS) 14 mg/24hr patch Place 1 patch (14 mg total) onto the skin daily. 28 patch 0  . tiotropium (SPIRIVA HANDIHALER) 18 MCG inhalation capsule Place 1 capsule (18 mcg total) into inhaler and inhale daily. 30 capsule 11  . albuterol (VENTOLIN HFA) 108 (90 Base) MCG/ACT inhaler Inhale 2 puffs into the lungs every 4 (four) hours as needed for wheezing or shortness of breath. 8 g 12   No facility-administered medications prior to visit.     Review of Systems  Constitutional: Positive for activity change, fatigue and unexpected weight change. Negative for fever.  HENT: Negative.   Eyes: Negative.   Respiratory: Positive for chest tightness,  shortness of breath and wheezing.   Gastrointestinal: Positive for abdominal distention. Negative for diarrhea, nausea, rectal pain and vomiting.  Genitourinary: Negative.   Musculoskeletal: Negative.   Skin: Negative.        Objective:   Physical Exam Vitals:   05/21/20 1420  BP: 107/73  Pulse: 94  Resp: 19  Temp: 97.6 F (36.4 C)  SpO2: 94%  Weight: 132 lb (59.9 kg)  Height: 5' 2.4" (1.585 m)    Gen: Pleasant, thin, in no distress,  normal affect  ENT: No lesions,  mouth clear,  oropharynx clear, no postnasal drip  Neck: No JVD, no TMG, no carotid bruits  Lungs: No use of accessory muscles, no dullness to percussion, distant breath sounds  Cardiovascular: RRR, heart sounds normal, no murmur or gallops, no peripheral edema  Abdomen: soft and NT, no HSM,  BS normal  Musculoskeletal: No deformities, no cyanosis or clubbing  Neuro: alert, non focal  Skin: Warm, no lesions or rashes  CT of the chest February 21, 2020 FINDINGS: Cardiovascular: There is mild to moderate cardiomegaly. Small amount of pericardial fluid is present. Mild prominence of the main pulmonary artery segment. No evidence of pulmonary emboli. Thoracic aorta is normal in caliber.  Mediastinum/Nodes: Possible 1.4 cm AP window lymph node and 1.1 cm precarinal lymph node. No significant hilar adenopathy. Remaining mediastinal structures are unremarkable.  Lungs/Pleura: Examination demonstrates centrilobular emphysematous disease. Linear density over the right base likely atelectasis. Small right pleural effusion is present. Mild linear atelectasis/scarring over the right middle lobe. There are several bilateral subcentimeter pulmonary nodules most subpleural in location with the largest over the lateral right lower lobe measuring 8 mm in greatest diameter (image 101 series 7). Airways are normal.  Upper Abdomen: No acute findings.  Musculoskeletal: Diffuse subcutaneous edema over the  chest. Suggestion of mild bilateral symmetric axillary adenopathy. No focal bony abnormality. Bone island over the lower thoracic spine unchanged.  Review of the MIP images confirms the above findings.  IMPRESSION: 1.  No evidence of pulmonary embolism.  2.  Small right pleural effusion with bibasilar atelectasis.  3. Multiple small bilateral subcentimeter pulmonary nodules mostly in subpleural location with the largest measuring 8 mm in greatest dimension over the lateral right lower lobe. Couple possible low-density slightly enlarged mediastinal lymph nodes as well as mild symmetric bilateral axillary adenopathy. Findings may be seen with metastatic disease as recommend clinical correlation and at least follow-up chest CT 3 months.  4. Mild-to-moderate cardiomegaly with small amount of pericardial fluid. Diffuse subcutaneous edema.  Sleep Study" MPRESSIONS - No significant obstructive or central sleep apnea.  Her overall AHI was 0.2 events per hour of sleep. - SpO2 low was 82%.  She wore 4 liters supplemental oxygen during the study.  DIAGNOSIS - Nocturnal Hypoxemia (327.26 [G47.36 ICD-10])  RECOMMENDATIONS - Continue 4 liters supplemental oxygen.        Assessment & Plan:  I personally reviewed all images and [facility] as well as any outside material available during this office visit and agree with the  radiology impressions.   Acute diastolic CHF (congestive heart failure) (HCC) Chronic diastolic heart failure stable at this time  Continue furosemide twice daily  Moderate to severe pulmonary hypertension (HCC) Pulmonary hypertension on the basis of severe end-stage chronic lung disease with chronic hypoxic respiratory failure  No medication treatment outside of treatment for lung disease and oxygen indicated  Chronic respiratory failure with hypoxia (HCC) Based on sleep study patient needs 5 L at night and 4 L with exertion during the  day with oxygen  COPD with chronic bronchitis (HCC) Continue DuoNeb as needed along with Breo inhaler and Spiriva daily  Patient has had a Pneumovax in 2018 does not need repeated as of yet  Patient needs pulmonary  functions as of yet to be achieved I will reorder these  Pulmonary nodules Pulmonary nodules to be followed up with CT scanning  Tobacco abuse I congratulated the patient on smoking cessation and she will continue to abstain   Diagnoses and all orders for this visit:  COPD with chronic bronchitis (HCC) -     Pulmonary Function Test; Future  Pulmonary nodules  Chronic respiratory failure with hypoxia (HCC)  Moderate to severe pulmonary hypertension (HCC)  Acute diastolic CHF (congestive heart failure) (HCC)  Tobacco abuse

## 2020-05-21 NOTE — Progress Notes (Signed)
f /u COPD

## 2020-05-21 NOTE — Assessment & Plan Note (Signed)
Chronic diastolic heart failure stable at this time  Continue furosemide twice daily

## 2020-05-21 NOTE — Assessment & Plan Note (Signed)
Pulmonary hypertension on the basis of severe end-stage chronic lung disease with chronic hypoxic respiratory failure  No medication treatment outside of treatment for lung disease and oxygen indicated

## 2020-05-21 NOTE — Assessment & Plan Note (Signed)
Pulmonary nodules to be followed up with CT scanning

## 2020-05-21 NOTE — Assessment & Plan Note (Signed)
I congratulated the patient on smoking cessation and she will continue to abstain

## 2020-05-22 ENCOUNTER — Ambulatory Visit
Admission: RE | Admit: 2020-05-22 | Discharge: 2020-05-22 | Disposition: A | Discharge status: Home / Self Care | Payer: Medicaid Other | Source: Ambulatory Visit | Attending: Critical Care Medicine | Admitting: Critical Care Medicine

## 2020-05-22 DIAGNOSIS — R599 Enlarged lymph nodes, unspecified: Secondary | ICD-10-CM

## 2020-05-22 DIAGNOSIS — J9611 Chronic respiratory failure with hypoxia: Secondary | ICD-10-CM

## 2020-05-22 DIAGNOSIS — I272 Pulmonary hypertension, unspecified: Secondary | ICD-10-CM

## 2020-05-22 MED ORDER — IOPAMIDOL (ISOVUE-300) INJECTION 61%
75.0000 mL | Freq: Once | INTRAVENOUS | Status: AC | PRN
  Administered 2020-05-22: 75 mL via INTRAVENOUS

## 2020-05-27 ENCOUNTER — Ambulatory Visit: Payer: Self-pay | Admitting: *Deleted

## 2020-05-27 NOTE — Telephone Encounter (Signed)
Patient was last seen by PCP 05/21/2020 for COPD. Patient states numbness in feet up to her ankles for 2 weeks has not improved, PCP contributed these symptoms to patient having lung disease. Patient is concerned and seeking clinical advice. Scheduled follow up with PCP for 8/25 (placed on wait list)  Spoke to patient- she states her numbness is getting worse to the point she feels pain with it. Patient has been scheduled with protocol perimeters. Patient is not sure if she can get transportation on Wednesday- she has transportation today and tomorrow. Patient has been put on cancellation list  Reason for Disposition  [1] Numbness or tingling in feet AND [2] new or increased  Answer Assessment - Initial Assessment Questions 1. ONSET: "When did the pain start?"      Numb to the point that they hurt- hands have started having symptoms 2. LOCATION: "Where is the pain located?"      Whole foot has pain- billateral 3. PAIN: "How bad is the pain?"    (Scale 1-10; or mild, moderate, severe)  - MILD (1-3): doesn't interfere with normal activities.   - MODERATE (4-7): interferes with normal activities (e.g., work or school) or awakens from sleep, limping.   - SEVERE (8-10): excruciating pain, unable to do any normal activities, unable to walk.      Numb-pain is mild 4. WORK OR EXERCISE: "Has there been any recent work or exercise that involved this part of the body?"      no 5. CAUSE: "What do you think is causing the foot pain?"     Lung disease- circulation 6. OTHER SYMPTOMS: "Do you have any other symptoms?" (e.g., leg pain, rash, fever, numbness)     Numbness-" feet feel like bricks" 7. PREGNANCY: "Is there any chance you are pregnant?" "When was your last menstrual period?"     n/a  Protocols used: FOOT PAIN-A-AH

## 2020-05-28 ENCOUNTER — Telehealth: Payer: Self-pay | Admitting: Critical Care Medicine

## 2020-05-28 DIAGNOSIS — J9612 Chronic respiratory failure with hypercapnia: Secondary | ICD-10-CM

## 2020-05-28 DIAGNOSIS — J9611 Chronic respiratory failure with hypoxia: Secondary | ICD-10-CM

## 2020-05-28 DIAGNOSIS — I272 Pulmonary hypertension, unspecified: Secondary | ICD-10-CM

## 2020-05-28 DIAGNOSIS — J449 Chronic obstructive pulmonary disease, unspecified: Secondary | ICD-10-CM

## 2020-05-28 NOTE — Telephone Encounter (Signed)
Overnight oximetry performed on 05/10/2020 reviewed. Total duration of the test 5 hours 55 minutes 16 seconds.  Test completed on 5 L/min oxygen Baseline starting saturation 86%. High saturation was 99%, lowest 53%. 4 hours 18 minutes 48 seconds spent less than 88% saturation.  This test qualifies the patient for oxygen. Previous polysomnogram did not demonstrate sleep apnea, but previous ABG at hospital discharge demonstrated chronic hypercapnic respiratory failure.  She likely needs BiPAP with bleed in oxygen to prevent hypoxia.  Recommend auto BiPAP with starting settings PS 10-15 + CPAP 5 with max pressure 20 with 5 L bleed in oxygen.  Steffanie Dunn, DO 05/28/20 1:12 PM [facility]

## 2020-05-29 NOTE — Progress Notes (Signed)
Please let Ms. Pfenning know that her CT scan looks stable- many of the areas that we were watching are better and everything else has been stable. Nothing different that we need to do now, but we will repeat a CT scan in 12 months to watch her pulmonary nodules. Thanks!  LPC

## 2020-05-29 NOTE — Telephone Encounter (Signed)
Pt called back, please return call  

## 2020-05-29 NOTE — Telephone Encounter (Signed)
Called and spoke with patient she is going to call back tomorrow to get scheduled with a sleep provider due to her not driving and would have to have her daughter bring her.    When patient calls back please schedule her with a sleep doctor.

## 2020-05-29 NOTE — Telephone Encounter (Signed)
ATC patient LMTCB to go over ONO results. Will wait to hear from patient

## 2020-05-30 DIAGNOSIS — Z7189 Other specified counseling: Secondary | ICD-10-CM | POA: Insufficient documentation

## 2020-05-30 NOTE — Progress Notes (Signed)
Cardiology Office Note   Date:  05/31/2020   ID:  Michele Parrish, DOB January 22, 1966, MRN [id number]  PCP:  Storm Frisk, MD  Cardiologist:   Rollene Rotunda, MD  Chief Complaint  Patient presents with  . Numbness      History of Present Illness: Michele Parrish is a 54 y.o. female who presents for evaluation of pulmonary HTN.  I saw her last in the hospital when she was diagnosed with this.  He had severe volume overload and was found to have elevated pulmonary pressures as below.  She has been diagnosed with chronic lung disease and is currently on 4 L.  She is followed by pulmonary.  They are considering further work-up presents with an evaluation for sarcoid.  Since discharge she has been much improved.  She lost about 20 pounds of fluid although she gained weight by eating because she is now not smoking cigarettes.  She is breathing better.  She denies any chest pressure, neck or arm discomfort.  She has had no new palpitations, presyncope or syncope.  Unfortunately she is having some pain in her feet.  This has also been numbness and tingling in her hands.  She thinks it is relatively acute and severe.  Is not describing claudication.  Is a burning discomfort that is there throughout the day.  She has not had any new lower extremity swelling.  She has never had this kind of discomfort.  Of note her work-up in the hospital included VQ scan but no evidence of chronic embolism.  She had no DVT on lower extremity Dopplers.  MRI did not suggest sarcoidosis.  CT demonstrated pulmonary nodules and mediastinal adenopathy.  She had right heart catheterization with RV pressure of 60/11, PA mean pressure of 40.  She is found to have pulmonary hypertension likely WHO group 3.  She is can have follow-up CT and possible endobronchial ultrasound if she has persistent adenopathy.  Past Medical History:  Diagnosis Date  . Acute exacerbation of CHF (congestive heart failure) (HCC) 02/22/2020  .  Bronchitis   . Pulmonary hypertension (HCC)     Past Surgical History:  Procedure Laterality Date  . ABDOMINAL HYSTERECTOMY    . RIGHT HEART CATH N/A 02/23/2020   Procedure: RIGHT HEART CATH;  Surgeon: Iran Ouch, MD;  Location: [facility];  Service: Cardiovascular;  Laterality: N/A;     Current Outpatient Medications  Medication Sig Dispense Refill  . fluticasone furoate-vilanterol (BREO ELLIPTA) 200-25 MCG/INH AEPB Inhale 1 puff into the lungs daily. 1 each 5  . furosemide (LASIX) 40 MG tablet Take 1 tablet (40 mg total) by mouth 2 (two) times daily. 60 tablet 12  . ipratropium-albuterol (DUONEB) 0.5-2.5 (3) MG/3ML SOLN Take 3 mLs by nebulization every 6 (six) hours as needed. 360 mL 1  . nicotine (NICODERM CQ - DOSED IN MG/24 HOURS) 14 mg/24hr patch Place 1 patch (14 mg total) onto the skin daily. 28 patch 0  . OXYGEN Inhale into the lungs.    . tiotropium (SPIRIVA HANDIHALER) 18 MCG inhalation capsule Place 1 capsule (18 mcg total) into inhaler and inhale daily. 30 capsule 11  . albuterol (VENTOLIN HFA) 108 (90 Base) MCG/ACT inhaler Inhale 2 puffs into the lungs every 4 (four) hours as needed for wheezing or shortness of breath. 8 g 12  . gabapentin (NEURONTIN) 300 MG capsule Take 1 capsule (300 mg total) by mouth daily. 30 capsule 6   No current facility-administered medications  for this visit.    Allergies:   Patient has no known allergies.    ROS:  Please see the history of present illness.   Otherwise, review of systems are positive for none.   All other systems are reviewed and negative.    PHYSICAL EXAM: VS:  BP 114/84 (BP Location: Left Arm, Patient Position: Sitting, Cuff Size: Normal)   Pulse 88   Ht 5\' 2"  (1.575 m)   Wt 138 lb (62.6 kg)   BMI 25.24 kg/m  , BMI Body mass index is 25.24 kg/m. GENERAL:  Well appearing HEENT:  Pupils equal round and reactive, fundi not visualized, oral mucosa unremarkable NECK:  No jugular venous distention, waveform  within normal limits, carotid upstroke brisk and symmetric, no bruits, no thyromegaly LYMPHATICS: Positive jugular venous distention LUNGS:  Clear to auscultation bilaterally BACK:  No CVA tenderness CHEST:  Unremarkable HEART:  PMI not displaced or sustained,S1 and S2 within normal limits, no S3, no S4, no clicks, no rubs, no murmurs ABD:  Flat, positive bowel sounds normal in frequency in pitch, no bruits, no rebound, no guarding, no midline pulsatile mass, no hepatomegaly, no splenomegaly EXT:  2 plus pulses throughout, no edema, no cyanosis no clubbing SKIN:  No rashes no nodules NEURO:  Cranial nerves II through XII grossly intact, motor grossly intact throughout PSYCH:  Cognitively intact, oriented to person place and time    EKG:  EKG is ordered today. The ekg ordered today demonstrates sinus rhythm, rate 88, axis within normal limits, intervals within normal limits, nonspecific diffuse T wave flattening.   Recent Labs: 02/21/2020: TSH 4.561 02/22/2020: B Natriuretic Peptide 846.6 02/25/2020: Hemoglobin 11.4; Magnesium 1.8; Platelets 203 03/26/2020: ALT 11; BUN 13; Creatinine, Ser 1.08; Potassium 4.6; Sodium 142    Lipid Panel    Component Value Date/Time   CHOL 119 02/21/2020 1605   TRIG 85 02/21/2020 1605   HDL 37 (L) 02/21/2020 1605   CHOLHDL 3.2 02/21/2020 1605   VLDL 17 02/21/2020 1605   LDLCALC 65 02/21/2020 1605      Wt Readings from Last 3 Encounters:  05/31/20 138 lb (62.6 kg)  05/21/20 132 lb (59.9 kg)  04/25/20 127 lb (57.6 kg)      Other studies Reviewed: Additional studies/ records that were reviewed today include: Hospital records. Review of the above records demonstrates:  Please see elsewhere in the note.     ASSESSMENT AND PLAN:  PULMONARY HTN:   This is secondary to chronic lung disease.   She is having further work-up of this.  At this point I will follow with repeat echocardiography in the future but no change in therapy.   She is also going to  have evaluation for possible sleep apnea.  CHRONIC DIASTOLIC DYSFUNCTION: She seems to be euvolemic.  She tolerates current medications no change in therapy.  I will be checking a basic metabolic profile and a BNP today.  FOOT PAIN: She has good pulses felt that this is peripheral vascular disease.  I wonder if she could have neuropathy.  I am going to check a B6 and B12.  I am going to go ahead and treat her with Neurontin 300 mg daily.  COVID EDUCATION:   She has had her vaccine.   Current medicines are reviewed at length with the patient today.  The patient does not have concerns regarding medicines.  The following changes have been made:  no change  Labs/ tests ordered today include:   Orders Placed This  Encounter  Procedures  . Basic metabolic panel  . Brain natriuretic peptide  . Vitamin B6  . B12  . EKG 12-Lead     Disposition:   FU with me or APP in six weeks.     Signed, Rollene Rotunda, MD  05/31/2020 5:22 PM    [facility]

## 2020-05-31 ENCOUNTER — Other Ambulatory Visit: Payer: Self-pay | Admitting: Cardiology

## 2020-05-31 ENCOUNTER — Encounter: Payer: Self-pay | Admitting: Cardiology

## 2020-05-31 ENCOUNTER — Ambulatory Visit (INDEPENDENT_AMBULATORY_CARE_PROVIDER_SITE_OTHER): Payer: Medicaid Other | Admitting: Cardiology

## 2020-05-31 ENCOUNTER — Other Ambulatory Visit: Payer: Self-pay

## 2020-05-31 VITALS — BP 114/84 | HR 88 | Ht 62.0 in | Wt 138.0 lb

## 2020-05-31 DIAGNOSIS — I272 Pulmonary hypertension, unspecified: Secondary | ICD-10-CM

## 2020-05-31 DIAGNOSIS — Z7189 Other specified counseling: Secondary | ICD-10-CM | POA: Diagnosis not present

## 2020-05-31 DIAGNOSIS — I5032 Chronic diastolic (congestive) heart failure: Secondary | ICD-10-CM

## 2020-05-31 MED ORDER — GABAPENTIN 300 MG PO CAPS: 300.0000 mg | ORAL_CAPSULE | Freq: Every day | ORAL | 6 refills | Status: DC

## 2020-05-31 MED FILL — GABAPENTIN 300 MG CAPSULE: 300 | 30 days supply | Qty: 30 | Fill #0

## 2020-05-31 NOTE — Patient Instructions (Signed)
Medication Instructions:   START Gabapentin 300 mg---take 1 capsule daily. (We have sent it to the pharmacy and will follow-up with you about cost of medication)  *If you need a refill on your cardiac medications before your next appointment, please call your pharmacy*    Follow-Up: At [facility], you and your health needs are our priority.  As part of our continuing mission to provide you with exceptional heart care, we have created designated Provider Care Teams.  These Care Teams include your primary Cardiologist (physician) and Advanced Practice Providers (APPs -  Physician Assistants and Nurse Practitioners) who all work together to provide you with the care you need, when you need it.  We recommend signing up for the patient portal called "MyChart".  Sign up information is provided on this After Visit Summary.  MyChart is used to connect with patients for Virtual Visits (Telemedicine).  Patients are able to view lab/test results, encounter notes, upcoming appointments, etc.  Non-urgent messages can be sent to your provider as well.   To learn more about what you can do with MyChart, go to [email or web address].    Your next appointment:   6 week(s)  The format for your next appointment:   In Person  Provider:   You may see one of the following Advanced Practice Providers on your designated Care Team:    Theodore Demark, PA-C  Joni Reining, DNP, ANP  Cadence Fransico Michael, PA-C

## 2020-06-02 ENCOUNTER — Other Ambulatory Visit: Payer: Self-pay | Admitting: Critical Care Medicine

## 2020-06-02 MED ORDER — GABAPENTIN 300 MG PO CAPS: 300.0000 mg | ORAL_CAPSULE | Freq: Every day | ORAL | 6 refills | Status: DC

## 2020-06-05 ENCOUNTER — Telehealth: Payer: Self-pay | Admitting: Critical Care Medicine

## 2020-06-05 LAB — BASIC METABOLIC PANEL
BUN/Creatinine Ratio: 16 (ref 9–23)
BUN: 14 mg/dL (ref 6–24)
CO2: 23 mmol/L (ref 20–29)
Calcium: 9 mg/dL (ref 8.7–10.2)
Chloride: 110 mmol/L — ABNORMAL HIGH (ref 96–106)
Creatinine, Ser: 0.86 mg/dL (ref 0.57–1.00)
GFR calc Af Amer: 90 mL/min/{1.73_m2} (ref >59)
GFR calc non Af Amer: 78 mL/min/{1.73_m2} (ref >59)
Glucose: 97 mg/dL (ref 65–99)
Potassium: 4.6 mmol/L (ref 3.5–5.2)
Sodium: 144 mmol/L (ref 134–144)

## 2020-06-05 LAB — VITAMIN B6: Vitamin B6: 3 ug/L (ref 2.0–32.8)

## 2020-06-05 LAB — VITAMIN B12: Vitamin B-12: 1071 pg/mL (ref 232–1245)

## 2020-06-05 LAB — BRAIN NATRIURETIC PEPTIDE: BNP: 71.9 pg/mL (ref 0.0–100.0)

## 2020-06-05 NOTE — Telephone Encounter (Signed)
Patient dropped off a physical residual functional capacity questionnaire for pcp to fill out. Patient provided a labeled envelope for pcp to send paperwork off via mail. Paperwork was placed in pcp box.

## 2020-06-12 NOTE — Telephone Encounter (Signed)
Paperwork has been filled out and patient has been called and informed that paperwork was ready.  Patient asked that paperwork be mailed to The law office of [employer].

## 2020-06-14 ENCOUNTER — Other Ambulatory Visit: Payer: Self-pay

## 2020-06-14 DIAGNOSIS — J9612 Chronic respiratory failure with hypercapnia: Secondary | ICD-10-CM

## 2020-06-19 ENCOUNTER — Ambulatory Visit: Payer: MEDICAID | Attending: Critical Care Medicine | Admitting: Critical Care Medicine

## 2020-06-19 ENCOUNTER — Other Ambulatory Visit: Payer: Self-pay

## 2020-06-19 ENCOUNTER — Other Ambulatory Visit: Payer: Self-pay | Admitting: Pharmacist

## 2020-06-19 ENCOUNTER — Encounter: Payer: Self-pay | Admitting: Critical Care Medicine

## 2020-06-19 VITALS — BP 123/89 | HR 95 | Temp 97.9°F | Resp 28 | Ht 62.0 in | Wt 137.0 lb

## 2020-06-19 DIAGNOSIS — Z23 Encounter for immunization: Secondary | ICD-10-CM | POA: Diagnosis not present

## 2020-06-19 DIAGNOSIS — I5032 Chronic diastolic (congestive) heart failure: Secondary | ICD-10-CM | POA: Diagnosis not present

## 2020-06-19 DIAGNOSIS — J9611 Chronic respiratory failure with hypoxia: Secondary | ICD-10-CM

## 2020-06-19 DIAGNOSIS — Z720 Tobacco use: Secondary | ICD-10-CM

## 2020-06-19 DIAGNOSIS — R200 Anesthesia of skin: Secondary | ICD-10-CM

## 2020-06-19 DIAGNOSIS — R918 Other nonspecific abnormal finding of lung field: Secondary | ICD-10-CM

## 2020-06-19 DIAGNOSIS — J449 Chronic obstructive pulmonary disease, unspecified: Secondary | ICD-10-CM

## 2020-06-19 DIAGNOSIS — I272 Pulmonary hypertension, unspecified: Secondary | ICD-10-CM

## 2020-06-19 DIAGNOSIS — R202 Paresthesia of skin: Secondary | ICD-10-CM | POA: Diagnosis not present

## 2020-06-19 DIAGNOSIS — N182 Chronic kidney disease, stage 2 (mild): Secondary | ICD-10-CM

## 2020-06-19 MED ORDER — NICOTINE POLACRILEX 2 MG MT LOZG: LOZENGE | OROMUCOSAL | 0 refills | Status: DC

## 2020-06-19 MED ORDER — NICOTINE POLACRILEX 4 MG MT LOZG: 4.0000 mg | LOZENGE | OROMUCOSAL | 2 refills | Status: DC | PRN

## 2020-06-19 MED ORDER — NICOTINE POLACRILEX 2 MG MT LOZG
LOZENGE | OROMUCOSAL | 2 refills | Status: DC
Start: 2020-06-19 — End: 2020-06-19

## 2020-06-19 MED FILL — SM NICOTINE 4 MG LOZENGE: 4 | 34 days supply | Qty: 72 | Fill #0

## 2020-06-19 NOTE — Patient Instructions (Signed)
Try nicotine lozenge for smoking cessation  No change in medications  Flu vaccine was given  Return Dr Delford Field 4 months  Let me know when you have medicaid so we can refer to neurology and obtain portable oxygen concentrator   Tobacco Use Disorder Tobacco use disorder (TUD) occurs when a person craves, seeks, and uses tobacco, regardless of the consequences. This disorder can cause problems with mental and physical health. It can affect your ability to have healthy relationships, and it can keep you from meeting your responsibilities at work, home, or school. Tobacco may be:  Smoked as a cigarette or cigar.  Inhaled using e-cigarettes.  Smoked in a pipe or hookah.  Chewed as smokeless tobacco.  Inhaled into the nostrils as snuff. Tobacco products contain a dangerous chemical called nicotine, which is very addictive. Nicotine triggers hormones that make the body feel stimulated and works on areas of the brain that make you feel good. These effects can make it hard for people to quit nicotine. Tobacco contains many other unsafe chemicals that can damage almost every organ in the body. Smoking tobacco also puts others in danger due to fire risk and possible health problems caused by breathing in secondhand smoke. What are the signs or symptoms? Symptoms of TUD may include:  Being unable to slow down or stop your tobacco use.  Spending an abnormal amount of time getting or using tobacco.  Craving tobacco. Cravings may last for up to 6 months after quitting.  Tobacco use that: ? Interferes with your work, school, or home life. ? Interferes with your personal and social relationships. ? Makes you give up activities that you once enjoyed or found important.  Using tobacco even though you know that it is: ? Dangerous or bad for your health or someone else's health. ? Causing problems in your life.  Needing more and more of the substance to get the same effect (developing  tolerance).  Experiencing unpleasant symptoms if you do not use the substance (withdrawal). Withdrawal symptoms may include: ? Depressed, anxious, or irritable mood. ? Difficulty concentrating. ? Increased appetite. ? Restlessness or trouble sleeping.  Using the substance to avoid withdrawal. How is this diagnosed? This condition may be diagnosed based on:  Your current and past tobacco use. Your health care provider may ask questions about how your tobacco use affects your life.  A physical exam. You may be diagnosed with TUD if you have at least two symptoms within a 32-month period. How is this treated? This condition is treated by stopping tobacco use. Many people are unable to quit on their own and need help. Treatment may include:  Nicotine replacement therapy (NRT). NRT provides nicotine without the other harmful chemicals in tobacco. NRT gradually lowers the dosage of nicotine in the body and reduces withdrawal symptoms. NRT is available as: ? Over-the-counter gums, lozenges, and skin patches. ? Prescription mouth inhalers and nasal sprays.  Medicine that acts on the brain to reduce cravings and withdrawal symptoms.  A type of talk therapy that examines your triggers for tobacco use, how to avoid them, and how to cope with cravings (behavioral therapy).  Hypnosis. This may help with withdrawal symptoms.  Joining a support group for others coping with TUD. The best treatment for TUD is usually a combination of medicine, talk therapy, and support groups. Recovery can be a long process. Many people start using tobacco again after stopping (relapse). If you relapse, it does not mean that treatment will not work. Follow  these instructions at home:  Lifestyle  Do not use any products that contain nicotine or tobacco, such as cigarettes and e-cigarettes.  Avoid things that trigger tobacco use as much as you can. Triggers include people and situations that usually cause you to  use tobacco.  Avoid drinks that contain caffeine, including coffee. These may worsen some withdrawal symptoms.  Find ways to manage stress. Wanting to smoke may cause stress, and stress can make you want to smoke. Relaxation techniques such as deep breathing, meditation, and yoga may help.  Attend support groups as needed. These groups are an important part of long-term recovery for many people. General instructions  Take over-the-counter and prescription medicines only as told by your health care provider.  Check with your health care provider before taking any new prescription or over-the-counter medicines.  Decide on a friend, family member, or smoking quit-line (such as 1-800-QUIT-NOW in the [place] that you can call or text when you feel the urge to smoke or when you need help coping with cravings.  Keep all follow-up visits as told by your health care provider and therapist. This is important. Contact a health care provider if:  You are not able to take your medicines as prescribed.  Your symptoms get worse, even with treatment. Summary  Tobacco use disorder (TUD) occurs when a person craves, seeks, and uses tobacco regardless of the consequences.  This condition may be diagnosed based on your current and past tobacco use and a physical exam.  Many people are unable to quit on their own and need help. Recovery can be a long process.  The most effective treatment for TUD is usually a combination of medicine, talk therapy, and support groups. This information is not intended to replace advice given to you by your health care provider. Make sure you discuss any questions you have with your health care provider. Document Revised: 09/29/2017 Document Reviewed: 09/29/2017 Elsevier Patient Education  2020 [employer].

## 2020-06-19 NOTE — Progress Notes (Signed)
Numbness is getting better on medication prescribed by Cardiologist.    _______________________________  Patient has walk test on 4 Liters of Oxygen. Saturation 80- 83% HR 103 after one lap.

## 2020-06-19 NOTE — Progress Notes (Signed)
Subjective:    Patient ID: Michele Parrish, female    DOB: 1966/07/10, 54 y.o.   MRN: [id number]  First Visit : 03/26/20 53 y.o.F s/p hosp stay for COPD exac and hypoxic RF in April.  Here to re est care and HFU. Adept home health.  Missed last post hosp visit  The patient is here in the office today wishing to establish for primary care after hospitalization for COPD exacerbation, pulmonary hypertension, anasarca, severe chronic hypoxic respiratory failure.  The patient initially was admitted between 28 April and 5 May with severe COPD exacerbation and significant pulmonary hypertension and severe hypoxemia.  Patient initially was on 80% saturation on room air.  Despite high flow oxygen she was difficult to oxygenate but after significant diuresis patient's oxygenation did improve.  Below is the discharge summary Admit date: 02/21/2020 Discharge date: 02/28/2020  Admitted From: Home  Disposition:  Home with O2   Recommendations for Outpatient Follow-up:  1. Follow up with Pulmonology 2. Follow up with Cardiology 3. Follow up with new PCP Dr. Delford Field 4. Dr. Delford Field please follow-up pulmonary nodules 5. Outpatient sleep study per pulmonology   Home Health: None  Equipment/Devices: O2  Discharge Condition: Fair  CODE STATUS: FULL Diet recommendation: Regular  Brief/Interim Summary: Michele Parrish is a 54 y.o. F with hx chronic bronchitis, poor medical follow up, smoking who presented with 2 weeks progressive shortness of breath, orthopnea, increased abdominal girth, leg swelling.  In the ER she was saturating 80% on room air.  Started on IV Lasix and admitted.  PRINCIPAL [facility] DIAGNOSIS: Acute on chronic hypoxic respiratory failure due to severe type III pulmonary hypertension, COPD    Discharge Diagnoses:  Respiratory failure due to type III pulmonary hypertension, COPD Admitted and started on diuresis.  Echocardiogram showed normal EF, grade 1 diastolic  dysfunction, but right ventricular dysfunction.  Cardiology was called to see the patient, who underwent right heart cath which showed severe pulmonary hypertension.  Pulmonology were consulted. They noted severe COPD, pulmonary nodule suggestive of sarcoidosis, and severe pulmonary hypertension.    The patient gradually was weaned down from high flow nasal cannula to 4 L supplemental oxygen.  She was diuresed 36 liters on admission.  She has follow-up with pulmonology, who recommended bronchodilators, supplemental oxygen 24/7.    CKD stage II Creatinine stabilized around 1.1  Multiple pulmonary nodules -Repeat CT scan in 3 months -Follow-up with pulmonology  Moderate protein calorie malnutrition As evidenced by weight loss, BMI 21, and albumin 2.7.  Smoking Smoking cessation recommended, modalities discussed  Below is a transition of care RN note on May 6  TCM RN note 02/29/20:   [facility] Discharge Telephone Follow up  Date of discharge and from where: 02/28/2020 from [facility]  How have you been since you were released from the [facility]? [facility] better than in the [facility] Any questions or concerns? Yes about prescribed  nicotine patch/ stated were not given. Robyne Parrish, CM RN, called  [facility] pharmacy and was informed that the patches were OTC and were 42$, and that she did not have any money .She stated that the other 2 meds were free and was told that Nicotine patch  do not have it.  [facility] pharmacy transferred prescription at a lower cost/  Notified pt that she could pick them up at [facility] center . Patient aware of cost and  Agreed.  Items Reviewed:  Did the pt receive and understand the discharge instructions provided? YES  Medications obtained and verified? But nicotine patches will pick them up today or tomorrow.   Any new allergies since your discharge? NONE  Dietary orders reviewed? YES   Do you have support at home?Lives by herself / asks friends to  help sometimes  Functional Questionnaire: (I = Independent and D = Dependent) ADLs: I  Follow up appointments reviewed:   PCP [facility] f/u appt confirmed?   Scheduled to see Dr Shan Levans on 03/11/2020 @ 9:00 am . Stated she asked a friend to bring her to bring her to the appt.   Specialist [facility] f/u appt confirmed?  Scheduled to see Cardiology and Pulmonary  on 05/10/2021and 03/27/2020   Are transportation arrangements needed? Stated she will ask her friend to drive or take public transportation. Offered buss pass if needed.    If their condition worsens, is the pt aware to call PCP or go to the Emergency Dept.? YES made pt aware of all signs and symptoms needed to watch for as per MD discharge  Instructions.  Was the patient provided with contact information for the PCP's office or ED? YES   Was to pt encouraged to call back with questions or concerns? YES Call back nr and name was provided .  Reinforced Compliance with prescribed meds and to stay off of cigarettes. Stressed the importance  to record of daily weight. Verbalized understanding DME Pt on 4L O2 viaNC for ambulation supplied by [facility]. Has 4 full tanks at home. Stated she has their contact name and phone nr . Instructed to call Michele Parrish and let Michele Parrish know if she can not reach them for additional supply.  Stated no other  home health services were provided at this time.   Pt failed f/u visit in May.  Since discharge the patient's breathing is better but she states the nasal cannula dries her nose out and she prefers a mask or cannula.  Also note there is no humidity in the home attached to the concentrator.  As well the patient states no one is shown her how to use her portable oxygen system.  The patient states she does have less edema in the feet not as much cough no chest pain no wheezing  05/21/2020 Patient was seen in return follow-up has severe COPD and significant pulmonary hypertension has been seen by  pulmonary medicine.  The patient underwent sleep study as well which showed no significant obstructive or central apnea however she desaturated and needed 4 L of oxygen during the study now she is taking 5 L at night and 4 L during the day  Unfortunately as she is uninsured and cannot afford a lightweight system She has quit smoking which is significantly improving her status.  She states her breathing is at baseline.  She does have a follow-up CT scan this week to evaluate the presence of the nodules in her lungs.  She needs a pulmonary function test but is yet to achieve this.  Patient continue Spiriva daily  The patient also successfully achieved a Covid vaccine  Due to the patient's medical status she is not a candidate for colonoscopy, mammography, or Pap smear  She is also not a candidate for tetanus vaccine at this time  06/19/2020 The patient is seen in return follow-up and continues to have dyspnea on exertion requiring 4 L of oxygen.  On arrival her saturations 90% on 4 L and with ambulation she drops as low as 83% on 4 L she does complain she has  had numbness in the feet for the past 2 weeks and her cardiologist did prescribe gabapentin.  She does have a history of prediabetes.  Note on arrival her hemoglobin A1c though was 5.7.  She is not on therapy for diabetes.  She is diet controlled.  Circulation of the lower extremities were checked by cardiology and is normal.  Patient's been maintained on her inhaled medications.  She also is on furosemide twice daily for chronic diastolic heart failure.  She is using 5 L of oxygen at night 4 L of exertion during the day with oxygen therapy.  She also has a DuoNeb and Breo inhaler along with Spiriva  She is still smoking about 1 cigarette every other day.  She is interested in eliminating that last cigarette.   Past Medical History:  Diagnosis Date  . Acute exacerbation of CHF (congestive heart failure) (HCC) 02/22/2020  . Bronchitis   .  Pericardial effusion   . Pulmonary hypertension (HCC)      Family History  Problem Relation Age of Onset  . Prostate cancer Father   . Stroke Father   . Multiple sclerosis Mother      Social History   Socioeconomic History  . Marital status: Single    Spouse name: Not on file  . Number of children: Not on file  . Years of education: Not on file  . Highest education level: Not on file  Occupational History  . Not on file  Tobacco Use  . Smoking status: Former Smoker    Packs/day: 1.50    Years: 42.00    Pack years: 63.00    Types: Cigarettes    Quit date: 02/18/2020    Years since quitting: 0.3  . Smokeless tobacco: Never Used  Vaping Use  . Vaping Use: Never used  Substance and Sexual Activity  . Alcohol use: Yes    Comment: 2 beers per day  . Drug use: No  . Sexual activity: Not on file  Other Topics Concern  . Not on file  Social History Narrative  . Not on file   Social Determinants of Health   Financial Resource Strain:   . Difficulty of Paying Living Expenses: Not on file  Food Insecurity:   . Worried About Programme researcher, broadcasting/film/videounning Out of Food in the Last Year: Not on file  . Ran Out of Food in the Last Year: Not on file  Transportation Needs:   . Lack of Transportation (Medical): Not on file  . Lack of Transportation (Non-Medical): Not on file  Physical Activity:   . Days of Exercise per Week: Not on file  . Minutes of Exercise per Session: Not on file  Stress:   . Feeling of Stress : Not on file  Social Connections:   . Frequency of Communication with Friends and Family: Not on file  . Frequency of Social Gatherings with Friends and Family: Not on file  . Attends Religious Services: Not on file  . Active Member of Clubs or Organizations: Not on file  . Attends BankerClub or Organization Meetings: Not on file  . Marital Status: Not on file  Intimate Partner Violence:   . Fear of Current or Ex-Partner: Not on file  . Emotionally Abused: Not on file  . Physically  Abused: Not on file  . Sexually Abused: Not on file     No Known Allergies   Outpatient Medications Prior to Visit  Medication Sig Dispense Refill  . albuterol (VENTOLIN HFA) 108 (90 Base) MCG/ACT  inhaler Inhale 2 puffs into the lungs every 4 (four) hours as needed for wheezing or shortness of breath. 8 g 12  . fluticasone furoate-vilanterol (BREO ELLIPTA) 200-25 MCG/INH AEPB Inhale 1 puff into the lungs daily. 1 each 5  . furosemide (LASIX) 40 MG tablet Take 1 tablet (40 mg total) by mouth 2 (two) times daily. 60 tablet 12  . gabapentin (NEURONTIN) 300 MG capsule Take 1 capsule (300 mg total) by mouth daily. 30 capsule 6  . ipratropium-albuterol (DUONEB) 0.5-2.5 (3) MG/3ML SOLN Take 3 mLs by nebulization every 6 (six) hours as needed. 360 mL 1  . nicotine (NICODERM CQ - DOSED IN MG/24 HOURS) 14 mg/24hr patch Place 1 patch (14 mg total) onto the skin daily. 28 patch 0  . OXYGEN Inhale into the lungs.    . tiotropium (SPIRIVA HANDIHALER) 18 MCG inhalation capsule Place 1 capsule (18 mcg total) into inhaler and inhale daily. 30 capsule 11   No facility-administered medications prior to visit.     Review of Systems  Constitutional: Positive for activity change, fatigue and unexpected weight change. Negative for fever.  HENT: Negative.   Eyes: Negative.   Respiratory: Positive for chest tightness, shortness of breath and wheezing.   Gastrointestinal: Positive for abdominal distention. Negative for diarrhea, nausea, rectal pain and vomiting.  Genitourinary: Negative.   Musculoskeletal: Negative.   Skin: Negative.        Objective:   Physical Exam Vitals:   06/19/20 1009  BP: 123/89  Pulse: 95  Resp: (!) 28  Temp: 97.9 F (36.6 C)  SpO2: 90%  Weight: 137 lb (62.1 kg)  Height: 5\' 2"  (1.575 m)  PF: (!) 4 L/min    Gen: Pleasant, thin, in no distress,  normal affect  ENT: No lesions,  mouth clear,  oropharynx clear, no postnasal drip  Neck: No JVD, no TMG, no carotid  bruits  Lungs: No use of accessory muscles, no dullness to percussion, distant breath sounds, no wheezes  Cardiovascular: RRR, heart sounds normal, no murmur or gallops, no peripheral edema, normal posterior tibialis dorsal pedalis pulses bilaterally  Abdomen: soft and NT, no HSM,  BS normal  Musculoskeletal: No deformities, no cyanosis or clubbing  Neuro: alert, non focal  Skin: Warm, no lesions or rashes  Foot exam was normal, sensation with monofilament intact bilaterally in all areas of the feet  CT of the chest February 21, 2020 FINDINGS: Cardiovascular: There is mild to moderate cardiomegaly. Small amount of pericardial fluid is present. Mild prominence of the main pulmonary artery segment. No evidence of pulmonary emboli. Thoracic aorta is normal in caliber.  Mediastinum/Nodes: Possible 1.4 cm AP window lymph node and 1.1 cm precarinal lymph node. No significant hilar adenopathy. Remaining mediastinal structures are unremarkable.  Lungs/Pleura: Examination demonstrates centrilobular emphysematous disease. Linear density over the right base likely atelectasis. Small right pleural effusion is present. Mild linear atelectasis/scarring over the right middle lobe. There are several bilateral subcentimeter pulmonary nodules most subpleural in location with the largest over the lateral right lower lobe measuring 8 mm in greatest diameter (image 101 series 7). Airways are normal.  Upper Abdomen: No acute findings.  Musculoskeletal: Diffuse subcutaneous edema over the chest. Suggestion of mild bilateral symmetric axillary adenopathy. No focal bony abnormality. Bone island over the lower thoracic spine unchanged.  Review of the MIP images confirms the above findings.  IMPRESSION: 1.  No evidence of pulmonary embolism.  2.  Small right pleural effusion with bibasilar atelectasis.  3. Multiple small  bilateral subcentimeter pulmonary nodules mostly in subpleural  location with the largest measuring 8 mm in greatest dimension over the lateral right lower lobe. Couple possible low-density slightly enlarged mediastinal lymph nodes as well as mild symmetric bilateral axillary adenopathy. Findings may be seen with metastatic disease as recommend clinical correlation and at least follow-up chest CT 3 months.  4. Mild-to-moderate cardiomegaly with small amount of pericardial fluid. Diffuse subcutaneous edema.  Sleep Study" MPRESSIONS - No significant obstructive or central sleep apnea.  Her overall AHI was 0.2 events per hour of sleep. - SpO2 low was 82%.  She wore 4 liters supplemental oxygen during the study.  DIAGNOSIS - Nocturnal Hypoxemia (327.26 [G47.36 ICD-10])  RECOMMENDATIONS - Continue 4 liters supplemental oxygen.        Assessment & Plan:  I personally reviewed all images and lab data in the [facility] system as well as any outside material available during this office visit and agree with the  radiology impressions.   Chronic diastolic heart failure (HCC) Chronic diastolic heart failure stable at this time continue furosemide as prescribed  Moderate to severe pulmonary hypertension (HCC) Moderate to severe pulmonary hypertension stable at this time secondary to diastolic heart failure and severe COPD  Continue oxygen therapy and COPD treatment along with diuretic   Chronic respiratory failure with hypoxia (HCC) The patient has chronic hypoxic respiratory failure and is instructed in a lightweight portable system and now has Medicaid  Will order a portable oxygen concentrator for this patient but may need home assessment to see if this would meet her needs  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 Current smoking consumption amount: 1 cigarette every other day  . Dicsussion on advise to quit smoking and smoking impacts: Impacts on ongoing lung deterioration and cardiovascular health  . patient's willingness to quit: Is willing to try to limit the last 2 cigarettes  . Methods to quit smoking discussed: Behavioral modification and nicotine replacement  . Medication management of smoking session drugs discussed: We will prescribe nicotine lozenge 4 mg to take as needed  . Resources provided:  AVS   . Setting quit date within the next month  . Follow-up arranged 2 months   Time spent counseling the patient: 10 minutes    Numbness in feet No evidence of significant diabetes, no evidence of circulatory issues  Plan to send to neurology for consultation of the sensation change in lower extremities   Naureen was seen today for follow-up.  Diagnoses and all orders for this visit:  Numbness and tingling of both feet -     Ambulatory referral to Neurology  Need for influenza vaccination -     Flu Vaccine QUAD 6+ mos PF IM (Fluarix Quad PF)  Chronic diastolic heart failure (HCC)  Moderate to severe pulmonary hypertension (HCC)  Chronic respiratory failure with hypoxia (HCC)  COPD with chronic bronchitis (HCC)  CKD (chronic kidney disease), stage II  Pulmonary nodules  Tobacco abuse  Numbness in feet  Other orders -     Discontinue: nicotine polacrilex (NICORETTE MINI) 2 MG lozenge; Take one as needed for smoking urge -     nicotine polacrilex (COMMIT) 4 MG lozenge; Take 1 lozenge (4 mg total) by mouth  as needed for smoking cessation.

## 2020-06-20 ENCOUNTER — Encounter: Payer: Self-pay | Admitting: Neurology

## 2020-06-20 ENCOUNTER — Encounter: Payer: Self-pay | Admitting: Critical Care Medicine

## 2020-06-20 DIAGNOSIS — R200 Anesthesia of skin: Secondary | ICD-10-CM | POA: Insufficient documentation

## 2020-06-20 NOTE — Assessment & Plan Note (Addendum)
Moderate to severe pulmonary hypertension stable at this time secondary to diastolic heart failure and severe COPD  Continue oxygen therapy and COPD treatment along with diuretic

## 2020-06-20 NOTE — Assessment & Plan Note (Signed)
Chronic diastolic heart failure stable at this time continue furosemide as prescribed

## 2020-06-20 NOTE — Assessment & Plan Note (Signed)
Recent CT showed stable pulmonary nodules follow-up as needed doubt malignancy

## 2020-06-20 NOTE — Assessment & Plan Note (Signed)
The patient has chronic hypoxic respiratory failure and is instructed in a lightweight portable system and now has Medicaid  Will order a portable oxygen concentrator for this patient but may need home assessment to see if this would meet her needs

## 2020-06-20 NOTE — Assessment & Plan Note (Signed)
Chronic kidney disease stable at this time continue to monitor

## 2020-06-20 NOTE — Assessment & Plan Note (Signed)
No evidence of significant diabetes, no evidence of circulatory issues  Plan to send to neurology for consultation of the sensation change in lower extremities

## 2020-06-20 NOTE — Assessment & Plan Note (Signed)
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

## 2020-06-20 NOTE — Assessment & Plan Note (Signed)
  .   Current smoking consumption amount: 1 cigarette every other day  . Dicsussion on advise to quit smoking and smoking impacts: Impacts on ongoing lung deterioration and cardiovascular health  . patient's willingness to quit: Is willing to try to limit the last 2 cigarettes  . Methods to quit smoking discussed: Behavioral modification and nicotine replacement  . Medication management of smoking session drugs discussed: We will prescribe nicotine lozenge 4 mg to take as needed  . Resources provided:  AVS   . Setting quit date within the next month  . Follow-up arranged 2 months   Time spent counseling the patient: 10 minutes

## 2020-06-24 ENCOUNTER — Other Ambulatory Visit: Payer: Self-pay | Admitting: Critical Care Medicine

## 2020-06-24 DIAGNOSIS — J9612 Chronic respiratory failure with hypercapnia: Secondary | ICD-10-CM

## 2020-06-26 ENCOUNTER — Encounter (HOSPITAL_COMMUNITY): Payer: Medicaid Other

## 2020-06-28 ENCOUNTER — Ambulatory Visit (HOSPITAL_COMMUNITY)
Admission: RE | Admit: 2020-06-28 | Discharge: 2020-06-28 | Disposition: A | Discharge status: Home / Self Care | Payer: Medicaid Other | Source: Ambulatory Visit | Attending: Critical Care Medicine | Admitting: Critical Care Medicine

## 2020-06-28 ENCOUNTER — Other Ambulatory Visit: Payer: Self-pay

## 2020-06-28 DIAGNOSIS — J9612 Chronic respiratory failure with hypercapnia: Secondary | ICD-10-CM | POA: Insufficient documentation

## 2020-06-28 LAB — BLOOD GAS, ARTERIAL
Acid-base deficit: 0.3 mmol/L (ref 0.0–2.0)
Bicarbonate: 23.6 mmol/L (ref 20.0–28.0)
Drawn by: 21179
FIO2: 36
O2 Saturation: 92.4 %
Patient temperature: 37
pCO2 arterial: 37.3 mmHg (ref 32.0–48.0)
pH, Arterial: 7.418 (ref 7.350–7.450)
pO2, Arterial: 73.9 mmHg — ABNORMAL LOW (ref 83.0–108.0)

## 2020-07-04 NOTE — Progress Notes (Signed)
Cardiology Clinic Note   Patient Name: Michele Parrish Date of Encounter: 07/05/2020  Primary Care Provider:  Storm Frisk, MD Primary Cardiologist:  Rollene Rotunda, MD  Patient Profile    Michele Parrish 54 year old female presents to the clinic today for follow-up evaluation of her chronic diastolic CHF.  Past Medical History    Past Medical History:  Diagnosis Date  . Acute exacerbation of CHF (congestive heart failure) (HCC) 02/22/2020  . Bronchitis   . Pericardial effusion   . Pulmonary hypertension (HCC)    Past Surgical History:  Procedure Laterality Date  . ABDOMINAL HYSTERECTOMY    . RIGHT HEART CATH N/A 02/23/2020   Procedure: RIGHT HEART CATH;  Surgeon: Iran Ouch, MD;  Location: [facility];  Service: Cardiovascular;  Laterality: N/A;    Allergies  No Known Allergies  History of Present Illness    Michele Parrish has a PMH of moderate-severe pulmonary hypertension, chronic diastolic CHF, COPD, chronic respiratory failure with hypoxia, CKD stage II, tobacco abuse, and numbness in her feet.  She was last seen by Dr. Antoine Poche on 05/31/2020.  He had recently seen her in the hospital when she was diagnosed with pulmonary hypertension.  She was severely fluid volume overloaded at that time and found to have elevated pulmonary pressures.  She was diagnosed with chronic lung disease and at the time of visit was on 4 L of oxygen.  She follows with pulmonology.  There was suspicion for sarcoid.  Since discharge she was much improved.  She had lost 20 pounds of fluid and had stopped smoking.  She denied chest pain, arm and neck discomfort.  She also denied palpitations, presyncope and syncope.  She did indicate she was having some pain in her feet.  She also indicated she was having numbness and tingling in her hands.  She was evaluated and it was felt that it was not related to claudication.  It was described as a burning discomfort throughout the day and she was  not having any lower extremity edema.  These were new symptoms for her.  While in the hospital she received VQ scan which showed no evidence of chronic embolus.  No DVT was found on lower extremity Dopplers.  Her MRI did not suggest sarcoidosis.  CT demonstrated pulmonary nodules and mediastinal adenopathy.  Her heart catheterization showed RV pressure of 60/11, PA mean pressure of 40.  She was seen by Dr. Delford Field on 06/19/2020.  She again complained of numbness in bilateral feet.  Again no evidence of significant diabetes or circulatory issues.  She was referred to neurology for further evaluation.  She presents to the clinic today for follow-up evaluation states she is feeling much better since being discharged from the hospital.  She states that she is taking all of her medications as prescribed and will be having an upcoming sleep study.  She states that she still continues to refrain from smoking and that her breathing is much better.  She continues on 4 L nasal cannula.  She has not had any further accumulation of fluid.  Her weight has been stable.  She has been fairly sedentary at home and states that she gets somewhat worn out when she has to go to her doctor's appointments.  I will have her increase her physical activity as tolerated, continue her current medications, and follow-up in 6 months.  Today she denies chest pain, increased shortness of breath, lower extremity edema, fatigue, palpitations, melena, hematuria, hemoptysis,  diaphoresis, weakness, presyncope, syncope, orthopnea, and PND.   Home Medications    Prior to Admission medications   Medication Sig Start Date End Date Taking? Authorizing Provider  albuterol (VENTOLIN HFA) 108 (90 Base) MCG/ACT inhaler Inhale 2 puffs into the lungs every 4 (four) hours as needed for wheezing or shortness of breath. 03/26/20 06/19/20  Storm FriskWright, Patrick E, MD  fluticasone furoate-vilanterol (BREO ELLIPTA) 200-25 MCG/INH AEPB Inhale 1 puff into the lungs  daily. 03/26/20   Storm FriskWright, Patrick E, MD  furosemide (LASIX) 40 MG tablet Take 1 tablet (40 mg total) by mouth 2 (two) times daily. 03/26/20   Storm FriskWright, Patrick E, MD  gabapentin (NEURONTIN) 300 MG capsule Take 1 capsule (300 mg total) by mouth daily. 06/02/20   Storm FriskWright, Patrick E, MD  ipratropium-albuterol (DUONEB) 0.5-2.5 (3) MG/3ML SOLN Take 3 mLs by nebulization every 6 (six) hours as needed. 03/26/20   Storm FriskWright, Patrick E, MD  nicotine (NICODERM CQ - DOSED IN MG/24 HOURS) 14 mg/24hr patch Place 1 patch (14 mg total) onto the skin daily. 03/26/20   Storm FriskWright, Patrick E, MD  nicotine polacrilex (COMMIT) 4 MG lozenge Take 1 lozenge (4 mg total) by mouth as needed for smoking cessation. 06/19/20   Storm FriskWright, Patrick E, MD  OXYGEN Inhale into the lungs.    [provider]  tiotropium (SPIRIVA HANDIHALER) 18 MCG inhalation capsule Place 1 capsule (18 mcg total) into inhaler and inhale daily. 03/26/20 03/26/21  Storm FriskWright, Patrick E, MD    Family History    Family History  Problem Relation Age of Onset  . Prostate cancer Father   . Stroke Father   . Multiple sclerosis Mother    She indicated that the status of her mother is unknown. She indicated that the status of her father is unknown.  Social History    Social History   Socioeconomic History  . Marital status: Single    Spouse name: Not on file  . Number of children: Not on file  . Years of education: Not on file  . Highest education level: Not on file  Occupational History  . Not on file  Tobacco Use  . Smoking status: Former Smoker    Packs/day: 1.50    Years: 42.00    Pack years: 63.00    Types: Cigarettes    Quit date: 02/18/2020    Years since quitting: 0.3  . Smokeless tobacco: Never Used  Vaping Use  . Vaping Use: Never used  Substance and Sexual Activity  . Alcohol use: Yes    Comment: 2 beers per day  . Drug use: No  . Sexual activity: Not on file  Other Topics Concern  . Not on file  Social History Narrative  . Not on file    Social Determinants of Health   Financial Resource Strain:   . Difficulty of Paying Living Expenses: Not on file  Food Insecurity:   . Worried About Programme researcher, broadcasting/film/videounning Out of Food in the Last Year: Not on file  . Ran Out of Food in the Last Year: Not on file  Transportation Needs:   . Lack of Transportation (Medical): Not on file  . Lack of Transportation (Non-Medical): Not on file  Physical Activity:   . Days of Exercise per Week: Not on file  . Minutes of Exercise per Session: Not on file  Stress:   . Feeling of Stress : Not on file  Social Connections:   . Frequency of Communication with Friends and Family: Not on file  .  Frequency of Social Gatherings with Friends and Family: Not on file  . Attends Religious Services: Not on file  . Active Member of Clubs or Organizations: Not on file  . Attends Banker Meetings: Not on file  . Marital Status: Not on file  Intimate Partner Violence:   . Fear of Current or Ex-Partner: Not on file  . Emotionally Abused: Not on file  . Physically Abused: Not on file  . Sexually Abused: Not on file     Review of Systems    General:  No chills, fever, night sweats or weight changes.  Cardiovascular:  No chest pain, dyspnea on exertion, edema, orthopnea, palpitations, paroxysmal nocturnal dyspnea. Dermatological: No rash, lesions/masses Respiratory: No cough, dyspnea Urologic: No hematuria, dysuria Abdominal:   No nausea, vomiting, diarrhea, bright red blood per rectum, melena, or hematemesis Neurologic:  No visual changes, wkns, changes in mental status. All other systems reviewed and are otherwise negative except as noted above.  Physical Exam    VS:  BP 115/80   Pulse 88   Ht 5\' 2"  (1.575 m)   Wt 138 lb 9.6 oz (62.9 kg)   SpO2 95%   BMI 25.35 kg/m  , BMI Body mass index is 25.35 kg/m. GEN: Well nourished, well developed, in no acute distress. HEENT: normal. Neck: Supple, no JVD, carotid bruits, or masses. Cardiac: RRR, no  murmurs, rubs, or gallops. No clubbing, cyanosis, edema.  Radials/DP/PT 2+ and equal bilaterally.  Respiratory:  Respirations regular and unlabored, clear to auscultation bilaterally. GI: Soft, nontender, nondistended, BS + x 4. MS: no deformity or atrophy. Skin: warm and dry, no rash. Neuro:  Strength and sensation are intact. Psych: Normal affect.  Accessory Clinical Findings    Recent Labs: 02/21/2020: TSH 4.561 02/25/2020: Hemoglobin 11.4; Magnesium 1.8; Platelets 203 03/26/2020: ALT 11 05/31/2020: BNP 71.9; BUN 14; Creatinine, Ser 0.86; Potassium 4.6; Sodium 144   Recent Lipid Panel    Component Value Date/Time   CHOL 119 02/21/2020 1605   TRIG 85 02/21/2020 1605   HDL 37 (L) 02/21/2020 1605   CHOLHDL 3.2 02/21/2020 1605   VLDL 17 02/21/2020 1605   LDLCALC 65 02/21/2020 1605    ECG personally reviewed by me today-none today.  Echocardiogram 02/26/2020 IMPRESSIONS    1. Left ventricular ejection fraction, by estimation, is 65 to 70%. The  left ventricle has normal function.  2. Right ventricular systolic function is moderately reduced. The right  ventricular size is mildly enlarged.  3. The aortic valve is tricuspid.  Cardiac catheterization 02/23/2020 Right heart catheterization was done via the right antecubital vein without complication.  Moderately severely elevated right-sided filling pressures with moderate to severe pulmonary hypertension and normal cardiac output  RA: 16 mmHg RV: 60 / 11 mmHg Pulmonary capillary wedge pressure: 16 mmHg PA: 66/26 with a mean of 40 mmHg Ao sat 92% PA sat 63% Cardiac output is 4.92 with a cardiac index of 2.86. Pulmonary vascular resistance: 4.9 Wood units  Recommendations: Pulmonary hypertension seems to be mixed venous and arterial but mostly arterial given that wedge pressure is only 16.  Avoid overdiuresis. I switched furosemide to oral. Continue evaluation of pulmonary hypertension and RV dysfunction.  Lower  extremity venous ultrasound-5/21 Summary:  BILATERAL:  - No evidence of deep vein thrombosis seen in the lower extremities,  bilaterally.  Assessment & Plan   1.  Chronic diastolic CHF-euvolemic today.  No increased DOE or activity intolerance.  Echocardiogram 02/26/2020 showed  65 to 70% EF.  Right ventricular systolic function is moderately reduced. The right ventricular size is mildly enlarged. Continue furosemide Heart healthy low-sodium diet-salty 6 given Increase physical activity as tolerated   Pulmonary hypertension-breathing continues to improve.  Seen and evaluated by pulmonology.  Continues on oxygen therapy and diuretic. Continue albuterol, DuoNeb, Breo Heart healthy low-sodium diet-salty 6 given Increase physical activity as tolerated  Disposition: Follow-up with Dr. Antoine Poche in 6 months.  Michele Parrish. Aalaysia Liggins NP-C    07/05/2020, 3:34 PM [facility] [address] Office [phone] Fax [phone]  Notice: This dictation was prepared with Dragon dictation along with smaller phrase technology. Any transcriptional errors that result from this process are unintentional and may not be corrected upon review.

## 2020-07-05 ENCOUNTER — Other Ambulatory Visit: Payer: Self-pay

## 2020-07-05 ENCOUNTER — Encounter: Payer: Self-pay | Admitting: General Practice

## 2020-07-05 ENCOUNTER — Ambulatory Visit (INDEPENDENT_AMBULATORY_CARE_PROVIDER_SITE_OTHER): Payer: Medicaid Other | Admitting: General Practice

## 2020-07-05 VITALS — BP 115/80 | HR 88 | Ht 62.0 in | Wt 138.6 lb

## 2020-07-05 DIAGNOSIS — I272 Pulmonary hypertension, unspecified: Secondary | ICD-10-CM

## 2020-07-05 DIAGNOSIS — I5032 Chronic diastolic (congestive) heart failure: Secondary | ICD-10-CM

## 2020-07-05 NOTE — Patient Instructions (Signed)
Medication Instructions:  continue  Current medications  *If you need a refill on your cardiac medications before your next appointment, please call your pharmacy*   Lab Work: None ordered   Testing/Procedures: None ordered   Follow-Up: At [facility], you and your health needs are our priority.  As part of our continuing mission to provide you with exceptional heart care, we have created designated Provider Care Teams.  These Care Teams include your primary Cardiologist (physician) and Advanced Practice Providers (APPs -  Physician Assistants and Nurse Practitioners) who all work together to provide you with the care you need, when you need it.  We recommend signing up for the patient portal called "MyChart".  Sign up information is provided on this After Visit Summary.  MyChart is used to connect with patients for Virtual Visits (Telemedicine).  Patients are able to view lab/test results, encounter notes, upcoming appointments, etc.  Non-urgent messages can be sent to your provider as well.   To learn more about what you can do with MyChart, go to [email or web address].    Your next appointment:   6 month(s)  The format for your next appointment:   In Person  Provider:   You may see Rollene Rotunda, MD or one of the following Advanced Practice Providers on your designated Care Team:    Theodore Demark, PA-C  Joni Reining, DNP, ANP  Cadence Fransico Michael, PA-C    Other Instructions

## 2020-07-10 MED FILL — GABAPENTIN 300 MG CAPSULE: 300 | 30 days supply | Qty: 30 | Fill #2

## 2020-07-10 MED FILL — GABAPENTIN 300 MG CAPSULE: 300 | 30 days supply | Qty: 30 | Fill #1

## 2020-07-11 NOTE — Progress Notes (Signed)
Tried calling pt and she did not answer- LMTCB x 1 9/16

## 2020-07-12 ENCOUNTER — Telehealth: Payer: Self-pay | Admitting: Critical Care Medicine

## 2020-07-12 NOTE — Telephone Encounter (Signed)
LMTCB x 1 

## 2020-07-16 NOTE — Telephone Encounter (Signed)
I am sorry, I thought we had already told her that based on her 06/28/20 ABG she does not qualify for BiPAP as we had hoped she would. Supplemental oxygen at night is the only option.  Is there another set of lab results I should be aware of?  Steffanie Dunn, DO 07/16/20 6:47 PM [facility]

## 2020-07-16 NOTE — Telephone Encounter (Signed)
Will forward to Dr. Chestine Spore to advise. Thanks.   Per ABG result note from Maryfrances Bunnell, RN: Was able to talk to patient regarding patient's results for their abg and BiPap. They verbalized an understanding of what was discussed. No further questions at this time.   Patient would like to know what does or should she do now? Dr. Chestine Spore please advise

## 2020-07-17 NOTE — Telephone Encounter (Signed)
Ok thank you for clarifying and going over this with her again to ensure understanding.  I appreciate it!  LPC

## 2020-07-17 NOTE — Telephone Encounter (Signed)
Spoke with patient regarding prior message. Advised patient that based on her 06/28/20 ABG she does not qualify for BiPAP as we had hoped she would. Supplemental oxygen at night is the only option. Patient was confused regarding prior message. Patient is understanding after I went over past messages. Patient's voice was understanding . Nothing else further needed.  FYI Dr.Clark I dont see any other labs for you to be aware about.  Thank you

## 2020-07-17 NOTE — Telephone Encounter (Signed)
Tried calling the pt, no answer so LMTCB.

## 2020-07-19 MED FILL — SM NICOTINE 4 MG LOZENGE: 4 | 34 days supply | Qty: 72 | Fill #1

## 2020-07-19 MED FILL — SPIRIVA 18 MCG CP-HANDIHALE: 18 | 30 days supply | Qty: 30 | Fill #1

## 2020-07-19 MED FILL — PROAIR HFA 90 MCG INHALER: 108 (90 BAS | 16 days supply | Qty: 9 | Fill #0

## 2020-07-22 ENCOUNTER — Ambulatory Visit: Payer: Medicaid Other | Attending: Critical Care Medicine | Admitting: Critical Care Medicine

## 2020-07-22 ENCOUNTER — Encounter: Payer: Self-pay | Admitting: Critical Care Medicine

## 2020-07-22 DIAGNOSIS — N39498 Other specified urinary incontinence: Secondary | ICD-10-CM | POA: Diagnosis not present

## 2020-07-22 DIAGNOSIS — R32 Unspecified urinary incontinence: Secondary | ICD-10-CM | POA: Insufficient documentation

## 2020-07-22 NOTE — Progress Notes (Signed)
Subjective:    Patient ID: Michele Parrish, female    DOB: [birth year], 54 y.o.   MRN: [id number]  Virtual Visit via Telephone Note  I connected with Michele Parrish on 07/22/20 at 11:00 AM EDT by telephone and verified that I am speaking with the correct person using two identifiers.   Consent:  I discussed the limitations, risks, security and privacy concerns of performing an evaluation and management service by telephone and the availability of in person appointments. I also discussed with the patient that there may be a patient responsible charge related to this service. The patient expressed understanding and agreed to proceed.  Location of patient: Patient is at home Location of provider: I am in my office Persons participating in the televisit with the patient.   No one else on the call    History of Present Illness:  First Visit : 03/26/20 53 y.o.F s/p hosp stay for COPD exac and hypoxic RF in April.  Here to re est care and HFU. Adept home health.  Missed last post hosp visit  The patient is here in the office today wishing to establish for primary care after hospitalization for COPD exacerbation, pulmonary hypertension, anasarca, severe chronic hypoxic respiratory failure.  The patient initially was admitted between 28 April and 5 May with severe COPD exacerbation and significant pulmonary hypertension and severe hypoxemia.  Patient initially was on 80% saturation on room air.  Despite high flow oxygen she was difficult to oxygenate but after significant diuresis patient's oxygenation did improve.  Below is the discharge summary Admit date: 02/21/2020 Discharge date: 02/28/2020  Admitted From: Home  Disposition:  Home with O2   Recommendations for Outpatient Follow-up:  1. Follow up with Pulmonology 2. Follow up with Cardiology 3. Follow up with new PCP Michele Parrish 4. Michele Parrish please follow-up pulmonary nodules 5. Outpatient sleep study per pulmonology   Home  Health: None  Equipment/Devices: O2  Discharge Condition: Fair  CODE STATUS: FULL Diet recommendation: Regular  Brief/Interim Summary: Michele Parrish is a 54 y.o. F with hx chronic bronchitis, poor medical follow up, smoking who presented with 2 weeks progressive shortness of breath, orthopnea, increased abdominal girth, leg swelling.  In the ER she was saturating 80% on room air.  Started on IV Lasix and admitted.  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  Echocardiogram showed normal EF, grade 1 diastolic dysfunction, but right ventricular dysfunction.  Cardiology was called to see the patient, who underwent right heart cath which showed severe pulmonary hypertension.  Pulmonology were consulted. They noted severe COPD, pulmonary nodule suggestive of sarcoidosis, and severe pulmonary hypertension.    The patient gradually was weaned down from high flow nasal cannula to 4 L supplemental oxygen.  She was diuresed 36 liters on admission.  She has follow-up with pulmonology, who recommended bronchodilators, supplemental oxygen 24/7.    CKD stage II Creatinine stabilized around 1.1  Multiple pulmonary nodules -Repeat CT scan in 3 months -Follow-up with pulmonology  Moderate protein calorie malnutrition As evidenced by weight loss, BMI 21, and albumin 2.7.  Smoking Smoking cessation recommended, modalities discussed  Below is a transition of care RN note on May 6  TCM RN note 02/29/20:   Hospital Discharge Telephone Follow up  Date of discharge and from where: 02/28/2020 from [facility]  How have you been since you were released from the hospital? Ok better than in the hospital Any questions or concerns? Yes about prescribed  nicotine patch/ stated  were not given. Michele Parrish, CM RN, called  [name] and was informed that the patches were OTC and were 42$, and that she did not have any money .She stated that the other 2 meds were free and was told that Nicotine patch  do not have it.  [name] pharmacy transferred prescription at a lower cost/  Notified pt that she could pick them up at [facility] . Patient aware of cost and  Agreed.  Items Reviewed:  Did the pt receive and understand the discharge instructions provided? YES   Medications obtained and verified? But nicotine patches will pick them up today or tomorrow.   Any new allergies since your discharge? NONE  Dietary orders reviewed? YES   Do you have support at home?Lives by herself / asks friends to help sometimes  Functional Questionnaire: (I = Independent and D = Dependent) ADLs: I  Follow up appointments reviewed:   PCP Hospital f/u appt confirmed?   Scheduled to see Dr Shan Parrish on 03/11/2020 @ 9:00 am . Stated she asked a friend to bring her to bring her to the appt.   Specialist Hospital f/u appt confirmed?  Scheduled to see Cardiology and Pulmonary  on 05/10/2021and 03/27/2020   Are transportation arrangements needed? Stated she will ask her friend to drive or take public transportation. Offered buss pass if needed.    If their condition worsens, is the pt aware to call PCP or go to the Emergency Dept.? YES made pt aware of all signs and symptoms needed to watch for as per MD discharge  Instructions.  Was the patient provided with contact information for the PCP's office or ED? YES   Was to pt encouraged to call back with questions or concerns? YES Call back nr and name was provided .  Reinforced Compliance with prescribed meds and to stay off of cigarettes. Stressed the importance  to record of daily weight. Verbalized understanding DME Pt on 4L O2 viaNC for ambulation supplied by [facility]. Has 4 full tanks at home. Stated she has  their contact name and phone nr . Instructed to call us and let us know if she can not reach them for additional supply.  Stated no other  home health services were provided at this time.   Pt failed f/u visit in May.  Since discharge the patient's breathing is better but she states the nasal cannula dries her nose out and she prefers a mask or cannula.  Also note there is no humidity in the home attached to the concentrator.  As well the patient states no one is shown her how to use her portable oxygen system.  The patient states she does have less edema in the feet not as much cough no chest pain no wheezing  05/21/2020 Patient was seen in return follow-up has severe COPD and significant pulmonary hypertension has been seen by pulmonary medicine.  The patient underwent sleep study as well which showed no significant obstructive or central apnea however she desaturated and needed 4 L of oxygen during the study now she is taking 5 L at night and 4 L during the day  Unfortunately as she is uninsured and cannot afford a lightweight system She has quit smoking which is significantly improving her status.  She states her breathing  is at baseline.  She does have a follow-up CT scan this week to evaluate the presence of the nodules in her lungs.  She needs a pulmonary function test but is yet to achieve this.  Patient continue Spiriva daily  The patient also successfully achieved a Covid vaccine  Due to the patient's medical status she is not a candidate for colonoscopy, mammography, or Pap smear  She is also not a candidate for tetanus vaccine at this time  06/19/2020 The patient is seen in return follow-up and continues to have dyspnea on exertion requiring 4 L of oxygen.  On arrival her saturations 90% on 4 L and with ambulation she drops as low as 83% on 4 L she does complain she has had numbness in the feet for the past 2 weeks and her cardiologist did prescribe gabapentin.  She does have a  history of prediabetes.  Note on arrival her hemoglobin A1c though was 5.7.  She is not on therapy for diabetes.  She is diet controlled.  Circulation of the lower extremities were checked by cardiology and is normal.  Patient's been maintained on her inhaled medications.  She also is on furosemide twice daily for chronic diastolic heart failure.  She is using 5 L of oxygen at night 4 L of exertion during the day with oxygen therapy.  She also has a DuoNeb and Breo inhaler along with Spiriva  She is still smoking about 1 cigarette every other day.  She is interested in eliminating that last cigarette.  07/22/2020 This is a brief phone note in which we discussed with the patient the need for dependent supplies for incontinence.  The patient will urinate frequently on her self throughout the day has to use extra underwear.  She is requesting incontinence supplies with depends.     Past Medical History:  Diagnosis Date  . Acute exacerbation of CHF (congestive heart failure) (HCC) 02/22/2020  . Bronchitis   . Pericardial effusion   . Pulmonary hypertension (HCC)      Family History  Problem Relation Age of Onset  . Prostate cancer Father   . Stroke Father   . Multiple sclerosis Mother      Social History   Socioeconomic History  . Marital status: Single    Spouse name: Not on file  . Number of children: Not on file  . Years of education: Not on file  . Highest education level: Not on file  Occupational History  . Not on file  Tobacco Use  . Smoking status: Former Smoker    Packs/day: 1.50    Years: 42.00    Pack years: 63.00    Types: Cigarettes    Quit date: 02/18/2020    Years since quitting: 0.4  . Smokeless tobacco: Never Used  Vaping Use  . Vaping Use: Never used  Substance and Sexual Activity  . Alcohol use: Yes    Comment: 2 beers per day  . Drug use: No  . Sexual activity: Not on file  Other Topics Concern  . Not on file  Social History Narrative  . Not on  file   Social Determinants of Health   Financial Resource Strain:   . Difficulty of Paying Living Expenses: Not on file  Food Insecurity:   . Worried About Programme researcher, broadcasting/film/videounning Out of Food in the Last Year: Not on file  . Ran Out of Food in the Last Year: Not on file  Transportation Needs:   . Lack of Transportation (Medical):  Not on file  . Lack of Transportation (Non-Medical): Not on file  Physical Activity:   . Days of Exercise per Week: Not on file  . Minutes of Exercise per Session: Not on file  Stress:   . Feeling of Stress : Not on file  Social Connections:   . Frequency of Communication with Friends and Family: Not on file  . Frequency of Social Gatherings with Friends and Family: Not on file  . Attends Religious Services: Not on file  . Active Member of Clubs or Organizations: Not on file  . Attends Banker Meetings: Not on file  . Marital Status: Not on file  Intimate Partner Violence:   . Fear of Current or Ex-Partner: Not on file  . Emotionally Abused: Not on file  . Physically Abused: Not on file  . Sexually Abused: Not on file     No Known Allergies   Outpatient Medications Prior to Visit  Medication Sig Dispense Refill  . albuterol (VENTOLIN HFA) 108 (90 Base) MCG/ACT inhaler Inhale 2 puffs into the lungs every 4 (four) hours as needed for wheezing or shortness of breath. 8 g 12  . fluticasone furoate-vilanterol (BREO ELLIPTA) 200-25 MCG/INH AEPB Inhale 1 puff into the lungs daily. 1 each 5  . furosemide (LASIX) 40 MG tablet Take 1 tablet (40 mg total) by mouth 2 (two) times daily. 60 tablet 12  . gabapentin (NEURONTIN) 300 MG capsule Take 1 capsule (300 mg total) by mouth daily. 30 capsule 6  . ipratropium-albuterol (DUONEB) 0.5-2.5 (3) MG/3ML SOLN Take 3 mLs by nebulization every 6 (six) hours as needed. 360 mL 1  . nicotine (NICODERM CQ - DOSED IN MG/24 HOURS) 14 mg/24hr patch Place 1 patch (14 mg total) onto the skin daily. 28 patch 0  . nicotine polacrilex  (COMMIT) 4 MG lozenge Take 1 lozenge (4 mg total) by mouth as needed for smoking cessation. 72 tablet 2  . OXYGEN Inhale into the lungs.    . tiotropium (SPIRIVA HANDIHALER) 18 MCG inhalation capsule Place 1 capsule (18 mcg total) into inhaler and inhale daily. 30 capsule 11   No facility-administered medications prior to visit.     Review of Systems  Constitutional: Positive for activity change, fatigue and unexpected weight change. Negative for fever.  HENT: Negative.   Eyes: Negative.   Respiratory: Positive for chest tightness, shortness of breath and wheezing.   Gastrointestinal: Positive for abdominal distention. Negative for diarrhea, nausea, rectal pain and vomiting.  Genitourinary: Positive for enuresis.       Incontinence  Musculoskeletal: Negative.   Skin: Negative.        Objective:   Physical Exam There were no vitals filed for this visit. No exam this is a phone note   CT of the chest February 21, 2020 FINDINGS: Cardiovascular: There is mild to moderate cardiomegaly. Small amount of pericardial fluid is present. Mild prominence of the main pulmonary artery segment. No evidence of pulmonary emboli. Thoracic aorta is normal in caliber.  Mediastinum/Nodes: Possible 1.4 cm AP window lymph node and 1.1 cm precarinal lymph node. No significant hilar adenopathy. Remaining mediastinal structures are unremarkable.  Lungs/Pleura: Examination demonstrates centrilobular emphysematous disease. Linear density over the right base likely atelectasis. Small right pleural effusion is present. Mild linear atelectasis/scarring over the right middle lobe. There are several bilateral subcentimeter pulmonary nodules most subpleural in location with the largest over the lateral right lower lobe measuring 8 mm in greatest diameter (image 101 series 7). Airways  are normal.  Upper Abdomen: No acute findings.  Musculoskeletal: Diffuse subcutaneous edema over the chest. Suggestion  of mild bilateral symmetric axillary adenopathy. No focal bony abnormality. Bone island over the lower thoracic spine unchanged.  Review of the MIP images confirms the above findings.  IMPRESSION: 1.  No evidence of pulmonary embolism.  2.  Small right pleural effusion with bibasilar atelectasis.  3. Multiple small bilateral subcentimeter pulmonary nodules mostly in subpleural location with the largest measuring 8 mm in greatest dimension over the lateral right lower lobe. Couple possible low-density slightly enlarged mediastinal lymph nodes as well as mild symmetric bilateral axillary adenopathy. Findings may be seen with metastatic disease as recommend clinical correlation and at least follow-up chest CT 3 months.  4. Mild-to-moderate cardiomegaly with small amount of pericardial fluid. Diffuse subcutaneous edema.  Sleep Study" MPRESSIONS - No significant obstructive or central sleep apnea.  Her overall AHI was 0.2 events per hour of sleep. - SpO2 low was 82%.  She wore 4 liters supplemental oxygen during the study.  DIAGNOSIS - Nocturnal Hypoxemia (327.26 [G47.36 ICD-10])  RECOMMENDATIONS - Continue 4 liters supplemental oxygen.        Assessment & Plan:  I personally reviewed all images and lab data in the [facility] system as well as any outside material available during this office visit and agree with the  radiology impressions.   Absence of bladder continence Inability to control urination will get incontinence supplies   Diagnoses and all orders for this visit:  Other urinary incontinence    Follow Up Instructions: Patient knows orders for incontinence supplies will be ordered   I discussed the assessment and treatment plan with the patient. The patient was provided an opportunity to ask questions and all were answered. The patient agreed with the plan and demonstrated an understanding of the instructions.   The patient was advised to call back or seek  an in-person evaluation if the symptoms worsen or if the condition fails to improve as anticipated.  I provided of non-face-to-face time during this encounter  including  median intraservice time , review of notes, labs, imaging, medications  and explaining diagnosis and management to the patient .    Shan Levans, MD

## 2020-07-22 NOTE — Assessment & Plan Note (Signed)
Inability to control urination will get incontinence supplies

## 2020-07-24 ENCOUNTER — Telehealth: Payer: Self-pay

## 2020-07-24 NOTE — Telephone Encounter (Signed)
Pt is wanting a refill on her Breo inhaler which is not covered under her ins.  Her Medicaid plan prefers Advair.  If appropriate, can we change therapy and can you send in a new script for the Advair?

## 2020-07-25 ENCOUNTER — Other Ambulatory Visit: Payer: Self-pay | Admitting: Critical Care Medicine

## 2020-07-25 MED ORDER — ADVAIR HFA 230-21 MCG/ACT IN AERO: 2.0000 | INHALATION_SPRAY | Freq: Two times a day (BID) | RESPIRATORY_TRACT | 12 refills | Status: DC

## 2020-07-25 MED FILL — ADVAIR HFA 230-21 MCG INH: 230-21 | 30 days supply | Qty: 12 | Fill #0

## 2020-07-25 MED FILL — GABAPENTIN 300 MG CAPSULE: 300 | 30 days supply | Qty: 30 | Fill #1

## 2020-07-25 NOTE — Addendum Note (Signed)
Addended by: Shan Levans E on: 07/25/2020 10:04 AM   Modules accepted: Orders

## 2020-07-30 MED FILL — FUROSEMIDE 40 MG TAB: 40 | 30 days supply | Qty: 60 | Fill #1

## 2020-08-13 ENCOUNTER — Ambulatory Visit: Payer: Medicaid Other | Admitting: Critical Care Medicine

## 2020-08-16 ENCOUNTER — Institutional Professional Consult (permissible substitution): Payer: Medicaid Other | Admitting: Pulmonary Disease

## 2020-08-28 ENCOUNTER — Institutional Professional Consult (permissible substitution): Payer: Medicaid Other | Admitting: Pulmonary Disease

## 2020-09-02 ENCOUNTER — Telehealth: Payer: Self-pay

## 2020-09-02 NOTE — Telephone Encounter (Signed)
-----   Message from Storm Frisk, MD sent at 09/02/2020 11:53 AM EST ----- Call this patient at :   [phone]   needs appt for paperwork

## 2020-09-02 NOTE — Telephone Encounter (Signed)
Per PCP pt needs an appt for completion of paperwork prior to next scheduled appt, ATC pt for scheduling, no answer LM to [facility]

## 2020-09-02 NOTE — Telephone Encounter (Signed)
ATC pt for scheduling, no answer, LM to [facility].

## 2020-09-03 NOTE — Telephone Encounter (Signed)
-----   Message from Storm Frisk, MD sent at 08/28/2020  4:34 PM EDT ----- Needs appt with me sometime November for paper work for [employer]  or she can drop off and I can fill out and send in

## 2020-09-03 NOTE — Telephone Encounter (Signed)
Called patient and LVM to return call and schedule an appt to have paperwork filled out.

## 2020-09-05 NOTE — Telephone Encounter (Signed)
ATC pt x 1 again, no answer, LM to [facility] re: scheduling, also sent [employer] letter to address on file.

## 2020-09-26 ENCOUNTER — Ambulatory Visit: Payer: Medicaid Other | Admitting: Neurology

## 2020-09-26 MED FILL — GABAPENTIN 300 MG CAPSULE: 300 | 30 days supply | Qty: 30 | Fill #2

## 2020-09-26 MED FILL — SM NICOTINE 4 MG LOZENGE: 4 | 34 days supply | Qty: 72 | Fill #2

## 2020-09-26 MED FILL — ADVAIR HFA 230-21 MCG INH: 230-21 | 30 days supply | Qty: 12 | Fill #1

## 2020-09-26 MED FILL — FUROSEMIDE 40 MG TAB: 40 | 30 days supply | Qty: 60 | Fill #2

## 2020-09-26 MED FILL — SPIRIVA 18 MCG CP-HANDIHALE: 18 | 30 days supply | Qty: 30 | Fill #2

## 2020-09-26 MED FILL — PROAIR HFA 90 MCG INHALER: 108 (90 BAS | 16 days supply | Qty: 9 | Fill #1

## 2020-10-22 ENCOUNTER — Ambulatory Visit: Payer: Medicaid Other | Admitting: Critical Care Medicine

## 2020-10-22 MED FILL — GABAPENTIN 300 MG CAPSULE: 300 | 30 days supply | Qty: 30 | Fill #3

## 2020-11-07 ENCOUNTER — Telehealth: Payer: Self-pay | Admitting: Critical Care Medicine

## 2020-11-07 NOTE — Telephone Encounter (Signed)
Patient came in to have a Physician Verification Statement  Form completed for [employer]. Form needs to be sent back to [employer] within 60 days. Patient stated she does not have transportation and ask if we can fax the form to [phone]. If unable to fax give her a call at [phone] and she will try to get a ride here.

## 2020-11-08 ENCOUNTER — Other Ambulatory Visit: Payer: Self-pay | Admitting: Critical Care Medicine

## 2020-11-08 ENCOUNTER — Telehealth: Payer: Self-pay | Admitting: Cardiology

## 2020-11-08 MED ORDER — GABAPENTIN 300 MG PO CAPS: 300.0000 mg | ORAL_CAPSULE | Freq: Every day | ORAL | 6 refills | Status: DC

## 2020-11-08 NOTE — Telephone Encounter (Signed)
°*  STAT* If patient is at the pharmacy, call can be transferred to refill team.   1. Which medications need to be refilled? (please list name of each medication and dose if known) gabapentin (NEURONTIN) 300 MG capsule  2. Which pharmacy/location (including street and city if local pharmacy) is medication to be sent to? [facility] [place], [place] - [place] [address]  3. Do they need a 30 day or 90 day supply? 90 day supply

## 2020-11-08 NOTE — Telephone Encounter (Signed)
Medication: gabapentin (NEURONTIN) 300 MG capsule Has the pt contacted their pharmacy? Yes  Preferred pharmacy: [facility] - [place], [place] - [place] [address] Phone:  [phone]  Fax:  [phone]      Please be advised refills may take up to 3 business days.  We ask that you follow up with your pharmacy.  Pt states she is in a lot of pain and hoping this can be filled today.

## 2020-11-08 NOTE — Telephone Encounter (Signed)
Left generic message for patient that we can't fill that medication. It was prescribed by another provider and that provider has to refill it.

## 2020-11-08 NOTE — Telephone Encounter (Signed)
Form placed in Dr. Lynelle Doctor box.

## 2020-12-12 ENCOUNTER — Other Ambulatory Visit: Payer: Self-pay | Admitting: Critical Care Medicine

## 2020-12-12 MED FILL — ADVAIR HFA 230-21 MCG INH: 230-21 | 30 days supply | Qty: 12 | Fill #2

## 2020-12-12 MED FILL — SPIRIVA 18 MCG CP-HANDIHALE: 18 | 30 days supply | Qty: 30 | Fill #3

## 2020-12-12 MED FILL — FUROSEMIDE 40 MG TAB: 40 | 30 days supply | Qty: 60 | Fill #3

## 2020-12-12 MED FILL — PROAIR HFA 90 MCG INHALER: 108 (90 BAS | 16 days supply | Qty: 9 | Fill #2

## 2020-12-12 MED FILL — GABAPENTIN 300 MG CAPSULE: 300 | 30 days supply | Qty: 30 | Fill #4

## 2020-12-12 MED FILL — SM NICOTINE 4 MG LOZENGE: 4 | 34 days supply | Qty: 72 | Fill #0

## 2020-12-12 NOTE — Telephone Encounter (Signed)
PT was following up with the below request to ensure [employer] does not shut off her power. Please call back to confirm the paperwork has been faxed to [phone].

## 2020-12-17 NOTE — Telephone Encounter (Signed)
I signed this and put in fax folder before I went on vacation

## 2020-12-20 ENCOUNTER — Telehealth: Payer: Self-pay | Admitting: Critical Care Medicine

## 2020-12-20 NOTE — Telephone Encounter (Signed)
Found fax confirmation log that went through on 11/18/2020. Form was notarized by Cheryll Dessert.    Called patient to let her know and she merged call  With [employer] that said they sent a denial letter b/c there was not an address on the form.   The [employer] representative will  Mail forms to [facility]. Address given.  Will not be able to email forms.

## 2020-12-20 NOTE — Telephone Encounter (Signed)
Spoke to patient to inform her that I could not find paperwork. She is being put in a bind to have to find someone to bring her to the office to drop off paperwork and wait for [employer] to mail paperwork.   Encouraged patient to call [employer] to see if they can fax forms to [name]. Gave patient our fax number.   Apologized to patient for not being able to locate paperwork. Informed that nurse will f/u on Monday.

## 2020-12-20 NOTE — Telephone Encounter (Signed)
Pt called in returning the office call. Pt says that she is following up on some paperwork that was completed by provider in reference to her electric bill. Pt says that she is currently on an oxygen machine. The form is to prevent any electric service interruptions.    Please assist pt further.

## 2020-12-23 ENCOUNTER — Telehealth: Payer: Self-pay

## 2020-12-23 NOTE — Telephone Encounter (Signed)
Denial of O2 concentrator and portable O2 system received from [facility].  [employer] is noted as the provider.   Call placed to [employer] ([employer] is now part of [employer]) and spoke to [place] who confirmed that the patient is still actively receiving O2 from their company.  She had no information about the denial .  Multiple attempts made to contact [facility]. The phone number # [phone] is the only number listed on the denial letter to contact for assistance.  This is also the phone number for [facility] listed in patient's demographics in Epic as well as on the copy of patient's insurance card.  With this number, there is no option to speak to anyone despite trying to access assistance as a provider or member multiple times.  The automated attendant did not offer an option to speak to a representative.   Call was placed to the nurse line twice. This number is listed on patient's ID card.  After [place] was entered as an option, the calls were dropped.

## 2020-12-23 NOTE — Telephone Encounter (Signed)
Copied from CRM [id number]. Topic: General - Other >> Dec 20, 2020  5:12 PM Marylen Ponto wrote: Reason for CRM: Pt returned call to [place] Attempted to transfer call but there was no answer. Pt stated she will speak with [place]ia on Monday.

## 2020-12-23 NOTE — Telephone Encounter (Signed)
Patient paperwork for [employer] was faxed and confirmation was received.  The address was added to the form found in the media section of patient record.  Patient aware.

## 2020-12-24 NOTE — Telephone Encounter (Signed)
From [facility] on-line assistance portal, this CM was referred to [facility] Dept # [phone].  Spoke to [place] S who could not provide any additional information about the denial other than what was noted on the letter. She  referred this CM to Provider Services and then Authorization services. Spoke to [employer]. She could not provide any additional information about the denial or verify if the denial was for a POC.    She said that a call was made to [facility] # [phone] on 12/11/2020 to speak to PCP regarding the denial and possibly scheduling a peer to peer review.   Informed her that there is no record of this call.    Message sent to [place] Newcomer, [employer] requesting assistance with trying to determine exactly what has been denied and if any information is needed to appeal the denial or resubmit an order. She said that she will need to research this issue, the patient has no account balance due and there is no indication of a denial. [employer] has been working with [employer] for O2 authorizations.  Copy of ages 45-3 of the denial letter were sent to [facility].

## 2020-12-31 ENCOUNTER — Ambulatory Visit: Payer: Medicaid Other | Admitting: Critical Care Medicine

## 2020-12-31 NOTE — Progress Notes (Deleted)
Subjective:    Patient ID: Michele Parrish, female    DOB: Apr 24, 1966, 55 y.o.   MRN: [id number]  Virtual Visit via Telephone Note  I connected with Michele Parrish on 12/31/20 at 10:00 AM EST by telephone and verified that I am speaking with the correct person using two identifiers.   Consent:  I discussed the limitations, risks, security and privacy concerns of performing an evaluation and management service by telephone and the availability of in person appointments. I also discussed with the patient that there may be a patient responsible charge related to this service. The patient expressed understanding and agreed to proceed.  Location of patient: Patient is at home Location of provider: I am in my office Persons participating in the televisit with the patient.   No one else on the call    History of Present Illness:  First Visit : 03/26/20 55 y.o.F s/p hosp stay for COPD exac and hypoxic RF in April.  Here to re est care and HFU. Adept home health.  Missed last post hosp visit  The patient is here in the office today wishing to establish for primary care after hospitalization for COPD exacerbation, pulmonary hypertension, anasarca, severe chronic hypoxic respiratory failure.  The patient initially was admitted between 28 April and 5 May with severe COPD exacerbation and significant pulmonary hypertension and severe hypoxemia.  Patient initially was on 80% saturation on room air.  Despite high flow oxygen she was difficult to oxygenate but after significant diuresis patient's oxygenation did improve.  Below is the discharge summary Admit date: 02/21/2020 Discharge date: 02/28/2020  Admitted From: Home  Disposition:  Home with O2   Recommendations for Outpatient Follow-up:  1. Follow up with Pulmonology 2. Follow up with Cardiology 3. Follow up with new PCP Dr. Delford Field 4. Dr. Delford Field please follow-up pulmonary nodules 5. Outpatient sleep study per pulmonology   Home  Health: None  Equipment/Devices: O2  Discharge Condition: Fair  CODE STATUS: FULL Diet recommendation: Regular  Brief/Interim Summary: Michele Parrish is a 55 y.o. F with hx chronic bronchitis, poor medical follow up, smoking who presented with 2 weeks progressive shortness of breath, orthopnea, increased abdominal girth, leg swelling.  In the ER she was saturating 80% on room air.  Started on IV Lasix and admitted.  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  Echocardiogram showed normal EF, grade 1 diastolic dysfunction, but right ventricular dysfunction.  Cardiology was called to see the patient, who underwent right heart cath which showed severe pulmonary hypertension.  Pulmonology were consulted. They noted severe COPD, pulmonary nodule suggestive of sarcoidosis, and severe pulmonary hypertension.    The patient gradually was weaned down from high flow nasal cannula to 4 L supplemental oxygen.  She was diuresed 36 liters on admission.  She has follow-up with pulmonology, who recommended bronchodilators, supplemental oxygen 24/7.    CKD stage II Creatinine stabilized around 1.1  Multiple pulmonary nodules -Repeat CT scan in 3 months -Follow-up with pulmonology  Moderate protein calorie malnutrition As evidenced by weight loss, BMI 21, and albumin 2.7.  Smoking Smoking cessation recommended, modalities discussed  Below is a transition of care RN note on May 6  TCM RN note 02/29/20:   Hospital Discharge Telephone Follow up  Date of discharge and from where: 02/28/2020 from [facility]  How have you been since you were released from the hospital? Ok better than in the hospital Any questions or concerns? Yes about prescribed  nicotine patch/ stated  were not given. [name], CM RN, called  [name] and was informed that the patches were OTC and were 42$, and that she did not have any money .She stated that the other 2 meds were free and was told that Nicotine patch  do not have it.  [name] pharmacy transferred prescription at a lower cost/  Notified pt that she could pick them up at [facility] . Patient aware of cost and  Agreed.  Items Reviewed:  Did the pt receive and understand the discharge instructions provided? YES   Medications obtained and verified? But nicotine patches will pick them up today or tomorrow.   Any new allergies since your discharge? NONE  Dietary orders reviewed? YES   Do you have support at home?Lives by herself / asks friends to help sometimes  Functional Questionnaire: (I = Independent and D = Dependent) ADLs: I  Follow up appointments reviewed:   PCP Hospital f/u appt confirmed?   Scheduled to see Dr Shan Levans on 03/11/2020 @ 9:00 am . Stated she asked a friend to bring her to bring her to the appt.   Specialist Hospital f/u appt confirmed?  Scheduled to see Cardiology and Pulmonary  on 05/10/2021and 03/27/2020   Are transportation arrangements needed? Stated she will ask her friend to drive or take public transportation. Offered buss pass if needed.    If their condition worsens, is the pt aware to call PCP or go to the Emergency Dept.? YES made pt aware of all signs and symptoms needed to watch for as per MD discharge  Instructions.  Was the patient provided with contact information for the PCP's office or ED? YES   Was to pt encouraged to call back with questions or concerns? YES Call back nr and name was provided .  Reinforced Compliance with prescribed meds and to stay off of cigarettes. Stressed the importance  to record of daily weight. Verbalized understanding DME Pt on 4L O2 viaNC for ambulation supplied by [facility]. Has 4 full tanks at home. Stated she has  their contact name and phone nr . Instructed to call us and let us know if she can not reach them for additional supply.  Stated no other  home health services were provided at this time.   Pt failed f/u visit in May.  Since discharge the patient's breathing is better but she states the nasal cannula dries her nose out and she prefers a mask or cannula.  Also note there is no humidity in the home attached to the concentrator.  As well the patient states no one is shown her how to use her portable oxygen system.  The patient states she does have less edema in the feet not as much cough no chest pain no wheezing  05/21/2020 Patient was seen in return follow-up has severe COPD and significant pulmonary hypertension has been seen by pulmonary medicine.  The patient underwent sleep study as well which showed no significant obstructive or central apnea however she desaturated and needed 4 L of oxygen during the study now she is taking 5 L at night and 4 L during the day  Unfortunately as she is uninsured and cannot afford a lightweight system She has quit smoking which is significantly improving her status.  She states her breathing  is at baseline.  She does have a follow-up CT scan this week to evaluate the presence of the nodules in her lungs.  She needs a pulmonary function test but is yet to achieve this.  Patient continue Spiriva daily  The patient also successfully achieved a Covid vaccine  Due to the patient's medical status she is not a candidate for colonoscopy, mammography, or Pap smear  She is also not a candidate for tetanus vaccine at this time  06/19/2020 The patient is seen in return follow-up and continues to have dyspnea on exertion requiring 4 L of oxygen.  On arrival her saturations 90% on 4 L and with ambulation she drops as low as 83% on 4 L she does complain she has had numbness in the feet for the past 2 weeks and her cardiologist did prescribe gabapentin.  She does have a  history of prediabetes.  Note on arrival her hemoglobin A1c though was 5.7.  She is not on therapy for diabetes.  She is diet controlled.  Circulation of the lower extremities were checked by cardiology and is normal.  Patient's been maintained on her inhaled medications.  She also is on furosemide twice daily for chronic diastolic heart failure.  She is using 5 L of oxygen at night 4 L of exertion during the day with oxygen therapy.  She also has a DuoNeb and Breo inhaler along with Spiriva  She is still smoking about 1 cigarette every other day.  She is interested in eliminating that last cigarette.  07/22/2020 This is a brief phone note in which we discussed with the patient the need for dependent supplies for incontinence.  The patient will urinate frequently on her self throughout the day has to use extra underwear.  She is requesting incontinence supplies with depends.   12/31/2020   Past Medical History:  Diagnosis Date  . Acute exacerbation of CHF (congestive heart failure) (HCC) 02/22/2020  . Bronchitis   . Pericardial effusion   . Pulmonary hypertension (HCC)      Family History  Problem Relation Age of Onset  . Prostate cancer Father   . Stroke Father   . Multiple sclerosis Mother      Social History   Socioeconomic History  . Marital status: Single    Spouse name: Not on file  . Number of children: Not on file  . Years of education: Not on file  . Highest education level: Not on file  Occupational History  . Not on file  Tobacco Use  . Smoking status: Former Smoker    Packs/day: 1.50    Years: 42.00    Pack years: 63.00    Types: Cigarettes    Quit date: 02/18/2020    Years since quitting: 0.8  . Smokeless tobacco: Never Used  Vaping Use  . Vaping Use: Never used  Substance and Sexual Activity  . Alcohol use: Yes    Comment: 2 beers per day  . Drug use: No  . Sexual activity: Not on file  Other Topics Concern  . Not on file  Social History Narrative  .  Not on file   Social Determinants of Health   Financial Resource Strain: Not on file  Food Insecurity: Not on file  Transportation Needs: Not on file  Physical Activity: Not on file  Stress: Not on file  Social Connections: Not on file  Intimate Partner Violence: Not on file     No Known Allergies   Outpatient Medications Prior to  Visit  Medication Sig Dispense Refill  . albuterol (VENTOLIN HFA) 108 (90 Base) MCG/ACT inhaler Inhale 2 puffs into the lungs every 4 (four) hours as needed for wheezing or shortness of breath. 8 g 12  . fluticasone-salmeterol (ADVAIR HFA) 230-21 MCG/ACT inhaler Inhale 2 puffs into the lungs 2 (two) times daily. 1 each 12  . furosemide (LASIX) 40 MG tablet Take 1 tablet (40 mg total) by mouth 2 (two) times daily. 60 tablet 12  . gabapentin (NEURONTIN) 300 MG capsule Take 1 capsule (300 mg total) by mouth daily. 30 capsule 6  . ipratropium-albuterol (DUONEB) 0.5-2.5 (3) MG/3ML SOLN Take 3 mLs by nebulization every 6 (six) hours as needed. 360 mL 1  . nicotine (NICODERM CQ - DOSED IN MG/24 HOURS) 14 mg/24hr patch Place 1 patch (14 mg total) onto the skin daily. 28 patch 0  . OXYGEN Inhale into the lungs.    . SM NICOTINE POLACRILEX 4 MG lozenge TAKE 1 LOZENGE (4 MG TOTAL) BY MOUTH AS NEEDED FOR SMOKING CESSATION. 72 lozenge 2  . tiotropium (SPIRIVA HANDIHALER) 18 MCG inhalation capsule Place 1 capsule (18 mcg total) into inhaler and inhale daily. 30 capsule 11   No facility-administered medications prior to visit.     Review of Systems  Constitutional: Positive for activity change, fatigue and unexpected weight change. Negative for fever.  HENT: Negative.   Eyes: Negative.   Respiratory: Positive for chest tightness, shortness of breath and wheezing.   Gastrointestinal: Positive for abdominal distention. Negative for diarrhea, nausea, rectal pain and vomiting.  Genitourinary: Positive for enuresis.       Incontinence  Musculoskeletal: Negative.   Skin:  Negative.        Objective:   Physical Exam There were no vitals filed for this visit. No exam this is a phone note   CT of the chest February 21, 2020 FINDINGS: Cardiovascular: There is mild to moderate cardiomegaly. Small amount of pericardial fluid is present. Mild prominence of the main pulmonary artery segment. No evidence of pulmonary emboli. Thoracic aorta is normal in caliber.  Mediastinum/Nodes: Possible 1.4 cm AP window lymph node and 1.1 cm precarinal lymph node. No significant hilar adenopathy. Remaining mediastinal structures are unremarkable.  Lungs/Pleura: Examination demonstrates centrilobular emphysematous disease. Linear density over the right base likely atelectasis. Small right pleural effusion is present. Mild linear atelectasis/scarring over the right middle lobe. There are several bilateral subcentimeter pulmonary nodules most subpleural in location with the largest over the lateral right lower lobe measuring 8 mm in greatest diameter (image 101 series 7). Airways are normal.  Upper Abdomen: No acute findings.  Musculoskeletal: Diffuse subcutaneous edema over the chest. Suggestion of mild bilateral symmetric axillary adenopathy. No focal bony abnormality. Bone island over the lower thoracic spine unchanged.  Review of the MIP images confirms the above findings.  IMPRESSION: 1.  No evidence of pulmonary embolism.  2.  Small right pleural effusion with bibasilar atelectasis.  3. Multiple small bilateral subcentimeter pulmonary nodules mostly in subpleural location with the largest measuring 8 mm in greatest dimension over the lateral right lower lobe. Couple possible low-density slightly enlarged mediastinal lymph nodes as well as mild symmetric bilateral axillary adenopathy. Findings may be seen with metastatic disease as recommend clinical correlation and at least follow-up chest CT 3 months.  4. Mild-to-moderate cardiomegaly with small  amount of pericardial fluid. Diffuse subcutaneous edema.  Sleep Study" MPRESSIONS - No significant obstructive or central sleep apnea.  Her overall AHI was 0.2 events per  hour of sleep. - SpO2 low was 82%.  She wore 4 liters supplemental oxygen during the study.  DIAGNOSIS - Nocturnal Hypoxemia (327.26 [G47.36 ICD-10])  RECOMMENDATIONS - Continue 4 liters supplemental oxygen.        Assessment & Plan:  I personally reviewed all images and lab data in the [facility] system as well as any outside material available during this office visit and agree with the  radiology impressions.   No problem-specific Assessment & Plan notes found for this encounter.   There are no diagnoses linked to this encounter.  Follow Up Instructions: Patient knows orders for incontinence supplies will be ordered   I discussed the assessment and treatment plan with the patient. The patient was provided an opportunity to ask questions and all were answered. The patient agreed with the plan and demonstrated an understanding of the instructions.   The patient was advised to call back or seek an in-person evaluation if the symptoms worsen or if the condition fails to improve as anticipated.  I provided of non-face-to-face time during this encounter  including  median intraservice time , review of notes, labs, imaging, medications  and explaining diagnosis and management to the patient .    Shan Levans, MD

## 2021-01-02 NOTE — Progress Notes (Deleted)
Cardiology Office Note   Date:  01/02/2021   ID:  Trany, Chernick 1966/03/27, MRN [id number]  PCP:  Storm Frisk, MD  Cardiologist:   Rollene Rotunda, MD  No chief complaint on file.     History of Present Illness: Michele Parrish is a 55 y.o. female who presents for evaluation of pulmonary HTN.  I saw her last in the hospital when she was diagnosed with this.  He had severe volume overload and was found to have elevated pulmonary pressures as below.  She has been diagnosed with chronic lung disease and is currently on 4 L.  She is followed by pulmonary.  ***    They are considering further work-up presents with an evaluation for sarcoid.  Since discharge she has been much improved.  She lost about 20 pounds of fluid although she gained weight by eating because she is now not smoking cigarettes.  She is breathing better.  She denies any chest pressure, neck or arm discomfort.  She has had no new palpitations, presyncope or syncope.  Unfortunately she is having some pain in her feet.  This has also been numbness and tingling in her hands.  She thinks it is relatively acute and severe.  Is not describing claudication.  Is a burning discomfort that is there throughout the day.  She has not had any new lower extremity swelling.  She has never had this kind of discomfort.  ***     Of note her work-up in the hospital included VQ scan but no evidence of chronic embolism.  She had no DVT on lower extremity Dopplers.  MRI did not suggest sarcoidosis.  CT demonstrated pulmonary nodules and mediastinal adenopathy.  She had right heart catheterization with RV pressure of 60/11, PA mean pressure of 40.  She is found to have pulmonary hypertension likely WHO group 3.  She is can have follow-up CT and possible endobronchial ultrasound if she has persistent adenopathy.  Past Medical History:  Diagnosis Date  . Acute exacerbation of CHF (congestive heart failure) (HCC) 02/22/2020  . Bronchitis    . Pericardial effusion   . Pulmonary hypertension (HCC)     Past Surgical History:  Procedure Laterality Date  . ABDOMINAL HYSTERECTOMY    . RIGHT HEART CATH N/A 02/23/2020   Procedure: RIGHT HEART CATH;  Surgeon: Iran Ouch, MD;  Location: [facility] INVASIVE CV LAB;  Service: Cardiovascular;  Laterality: N/A;     Current Outpatient Medications  Medication Sig Dispense Refill  . albuterol (VENTOLIN HFA) 108 (90 Base) MCG/ACT inhaler Inhale 2 puffs into the lungs every 4 (four) hours as needed for wheezing or shortness of breath. 8 g 12  . fluticasone-salmeterol (ADVAIR HFA) 230-21 MCG/ACT inhaler Inhale 2 puffs into the lungs 2 (two) times daily. 1 each 12  . furosemide (LASIX) 40 MG tablet Take 1 tablet (40 mg total) by mouth 2 (two) times daily. 60 tablet 12  . gabapentin (NEURONTIN) 300 MG capsule Take 1 capsule (300 mg total) by mouth daily. 30 capsule 6  . ipratropium-albuterol (DUONEB) 0.5-2.5 (3) MG/3ML SOLN Take 3 mLs by nebulization every 6 (six) hours as needed. 360 mL 1  . nicotine (NICODERM CQ - DOSED IN MG/24 HOURS) 14 mg/24hr patch Place 1 patch (14 mg total) onto the skin daily. 28 patch 0  . OXYGEN Inhale into the lungs.    . SM NICOTINE POLACRILEX 4 MG lozenge TAKE 1 LOZENGE (4 MG TOTAL) BY MOUTH AS NEEDED  FOR SMOKING CESSATION. 72 lozenge 2  . tiotropium (SPIRIVA HANDIHALER) 18 MCG inhalation capsule Place 1 capsule (18 mcg total) into inhaler and inhale daily. 30 capsule 11   No current facility-administered medications for this visit.    Allergies:   Patient has no known allergies.    ROS:  Please see the history of present illness.   Otherwise, review of systems are positive for none.   All other systems are reviewed and negative.    PHYSICAL EXAM: VS:  There were no vitals taken for this visit. , BMI There is no height or weight on file to calculate BMI. GENERAL:  Well appearing HEENT:  Pupils equal round and reactive, fundi not visualized, oral mucosa  unremarkable NECK:  No jugular venous distention, waveform within normal limits, carotid upstroke brisk and symmetric, no bruits, no thyromegaly LYMPHATICS: Positive jugular venous distention LUNGS:  Clear to auscultation bilaterally BACK:  No CVA tenderness CHEST:  Unremarkable HEART:  PMI not displaced or sustained,S1 and S2 within normal limits, no S3, no S4, no clicks, no rubs, no murmurs ABD:  Flat, positive bowel sounds normal in frequency in pitch, no bruits, no rebound, no guarding, no midline pulsatile mass, no hepatomegaly, no splenomegaly EXT:  2 plus pulses throughout, no edema, no cyanosis no clubbing SKIN:  No rashes no nodules NEURO:  Cranial nerves II through XII grossly intact, motor grossly intact throughout PSYCH:  Cognitively intact, oriented to person place and time    EKG:  EKG is *** ordered today. The ekg ordered today demonstrates sinus rhythm, rate ***, axis within normal limits, intervals within normal limits, nonspecific diffuse T wave flattening.   Recent Labs: 02/21/2020: TSH 4.561 02/25/2020: Hemoglobin 11.4; Magnesium 1.8; Platelets 203 03/26/2020: ALT 11 05/31/2020: BNP 71.9; BUN 14; Creatinine, Ser 0.86; Potassium 4.6; Sodium 144    Lipid Panel    Component Value Date/Time   CHOL 119 02/21/2020 1605   TRIG 85 02/21/2020 1605   HDL 37 (L) 02/21/2020 1605   CHOLHDL 3.2 02/21/2020 1605   VLDL 17 02/21/2020 1605   LDLCALC 65 02/21/2020 1605      Wt Readings from Last 3 Encounters:  07/05/20 138 lb 9.6 oz (62.9 kg)  06/19/20 137 lb (62.1 kg)  05/31/20 138 lb (62.6 kg)      Other studies Reviewed: Additional studies/ records that were reviewed today include: *** Review of the above records demonstrates:  Please see elsewhere in the note.     ASSESSMENT AND PLAN:  PULMONARY HTN:   ***  This is secondary to chronic lung disease.   She is having further work-up of this.  At this point I will follow with repeat echocardiography in the future but no  change in therapy.   She is also going to have evaluation for possible sleep apnea.  CHRONIC DIASTOLIC DYSFUNCTION:    ***  he seems to be euvolemic.  She tolerates current medications no change in therapy.  I will be checking a basic metabolic profile and a BNP today.  FOOT PAIN:   ***  She has good pulses felt that this is peripheral vascular disease.  I wonder if she could have neuropathy.  I am going to check a B6 and B12.  I am going to go ahead and treat her with Neurontin 300 mg daily.    Current medicines are reviewed at length with the patient today.  The patient does not have concerns regarding medicines.  The following changes have been made:  ***  Labs/ tests ordered today include:  ***  No orders of the defined types were placed in this encounter.    Disposition:   FU with me in ***     Signed, Rollene Rotunda, MD  01/02/2021 8:29 PM    [facility]

## 2021-01-03 ENCOUNTER — Ambulatory Visit: Payer: Medicaid Other | Admitting: Cardiology

## 2021-01-03 DIAGNOSIS — I272 Pulmonary hypertension, unspecified: Secondary | ICD-10-CM

## 2021-01-03 DIAGNOSIS — I5032 Chronic diastolic (congestive) heart failure: Secondary | ICD-10-CM

## 2021-01-07 ENCOUNTER — Telehealth: Payer: Self-pay | Admitting: Critical Care Medicine

## 2021-01-07 NOTE — Telephone Encounter (Signed)
Copied from CRM [id number]. Topic: General - Call Back - No Documentation >> Jan 06, 2021  5:25 PM Aretta Nip wrote: CRM for notification. PT states that she was talking to Dr.Wright's nurse and got disconnected, pls call her back first thing in the am if possible at [phone]

## 2021-01-21 MED FILL — GABAPENTIN 300 MG CAPSULE: 300 | 30 days supply | Qty: 30 | Fill #5

## 2021-01-21 MED FILL — SM NICOTINE 4 MG LOZENGE: 4 | 34 days supply | Qty: 72 | Fill #1

## 2021-01-25 ENCOUNTER — Other Ambulatory Visit: Payer: Self-pay

## 2021-02-17 ENCOUNTER — Other Ambulatory Visit: Payer: Self-pay

## 2021-02-17 MED FILL — Nicotine Polacrilex Lozenge 4 MG: OROMUCOSAL | 30 days supply | Qty: 72 | Fill #0 | Status: AC

## 2021-02-17 MED FILL — Gabapentin Cap 300 MG: ORAL | 30 days supply | Qty: 30 | Fill #0 | Status: AC

## 2021-02-18 ENCOUNTER — Other Ambulatory Visit: Payer: Self-pay

## 2021-03-25 ENCOUNTER — Telehealth: Payer: Self-pay

## 2021-03-25 NOTE — Telephone Encounter (Signed)
Signed CMN for O@ sent to [facility]

## 2021-07-02 ENCOUNTER — Ambulatory Visit: Payer: Medicaid Other | Admitting: Physician Assistant

## 2021-09-05 ENCOUNTER — Other Ambulatory Visit: Payer: Self-pay

## 2021-09-05 ENCOUNTER — Telehealth: Payer: Self-pay

## 2021-09-05 NOTE — Telephone Encounter (Signed)
Pt called in stating she needs a new hose for her breathing machine and wanted to see if PCP could send over a script to the pharmacy, please advise.    [facility]   Phone: [phone]  Fax: [phone]    Pt may need to use a DME company for hose.

## 2021-09-06 ENCOUNTER — Inpatient Hospital Stay (HOSPITAL_COMMUNITY)
Admission: EM | Admit: 2021-09-06 | Discharge: 2021-09-11 | DRG: 193 | Disposition: A | Discharge status: Home / Self Care | Payer: Medicaid Other | Attending: Family Medicine | Admitting: Family Medicine

## 2021-09-06 ENCOUNTER — Encounter (HOSPITAL_COMMUNITY): Payer: Self-pay

## 2021-09-06 ENCOUNTER — Emergency Department (HOSPITAL_COMMUNITY): Payer: Medicaid Other

## 2021-09-06 DIAGNOSIS — R9431 Abnormal electrocardiogram [ECG] [EKG]: Secondary | ICD-10-CM | POA: Diagnosis present

## 2021-09-06 DIAGNOSIS — Z7951 Long term (current) use of inhaled steroids: Secondary | ICD-10-CM

## 2021-09-06 DIAGNOSIS — J189 Pneumonia, unspecified organism: Secondary | ICD-10-CM

## 2021-09-06 DIAGNOSIS — I2721 Secondary pulmonary arterial hypertension: Secondary | ICD-10-CM | POA: Diagnosis present

## 2021-09-06 DIAGNOSIS — J180 Bronchopneumonia, unspecified organism: Principal | ICD-10-CM | POA: Diagnosis present

## 2021-09-06 DIAGNOSIS — Z720 Tobacco use: Secondary | ICD-10-CM

## 2021-09-06 DIAGNOSIS — Z23 Encounter for immunization: Secondary | ICD-10-CM | POA: Diagnosis not present

## 2021-09-06 DIAGNOSIS — F32A Depression, unspecified: Secondary | ICD-10-CM | POA: Diagnosis present

## 2021-09-06 DIAGNOSIS — R45851 Suicidal ideations: Secondary | ICD-10-CM | POA: Diagnosis present

## 2021-09-06 DIAGNOSIS — J440 Chronic obstructive pulmonary disease with acute lower respiratory infection: Secondary | ICD-10-CM | POA: Diagnosis present

## 2021-09-06 DIAGNOSIS — J441 Chronic obstructive pulmonary disease with (acute) exacerbation: Secondary | ICD-10-CM | POA: Diagnosis present

## 2021-09-06 DIAGNOSIS — Z20822 Contact with and (suspected) exposure to covid-19: Secondary | ICD-10-CM | POA: Diagnosis present

## 2021-09-06 DIAGNOSIS — H1032 Unspecified acute conjunctivitis, left eye: Secondary | ICD-10-CM | POA: Diagnosis not present

## 2021-09-06 DIAGNOSIS — J9621 Acute and chronic respiratory failure with hypoxia: Secondary | ICD-10-CM | POA: Diagnosis present

## 2021-09-06 DIAGNOSIS — E876 Hypokalemia: Secondary | ICD-10-CM | POA: Diagnosis present

## 2021-09-06 DIAGNOSIS — R339 Retention of urine, unspecified: Secondary | ICD-10-CM | POA: Diagnosis present

## 2021-09-06 DIAGNOSIS — Z90710 Acquired absence of both cervix and uterus: Secondary | ICD-10-CM

## 2021-09-06 DIAGNOSIS — S0502XA Injury of conjunctiva and corneal abrasion without foreign body, left eye, initial encounter: Secondary | ICD-10-CM | POA: Diagnosis not present

## 2021-09-06 DIAGNOSIS — X58XXXA Exposure to other specified factors, initial encounter: Secondary | ICD-10-CM | POA: Diagnosis not present

## 2021-09-06 DIAGNOSIS — I5032 Chronic diastolic (congestive) heart failure: Secondary | ICD-10-CM | POA: Diagnosis present

## 2021-09-06 DIAGNOSIS — N179 Acute kidney failure, unspecified: Secondary | ICD-10-CM | POA: Diagnosis not present

## 2021-09-06 DIAGNOSIS — N182 Chronic kidney disease, stage 2 (mild): Secondary | ICD-10-CM | POA: Diagnosis present

## 2021-09-06 DIAGNOSIS — Z9981 Dependence on supplemental oxygen: Secondary | ICD-10-CM | POA: Diagnosis not present

## 2021-09-06 DIAGNOSIS — H1033 Unspecified acute conjunctivitis, bilateral: Secondary | ICD-10-CM | POA: Diagnosis not present

## 2021-09-06 DIAGNOSIS — Z87891 Personal history of nicotine dependence: Secondary | ICD-10-CM | POA: Diagnosis present

## 2021-09-06 DIAGNOSIS — Z66 Do not resuscitate: Secondary | ICD-10-CM | POA: Diagnosis present

## 2021-09-06 DIAGNOSIS — Y92230 Patient room in hospital as the place of occurrence of the external cause: Secondary | ICD-10-CM | POA: Diagnosis not present

## 2021-09-06 DIAGNOSIS — H109 Unspecified conjunctivitis: Secondary | ICD-10-CM | POA: Diagnosis present

## 2021-09-06 DIAGNOSIS — I272 Pulmonary hypertension, unspecified: Secondary | ICD-10-CM | POA: Diagnosis present

## 2021-09-06 DIAGNOSIS — J9601 Acute respiratory failure with hypoxia: Secondary | ICD-10-CM

## 2021-09-06 DIAGNOSIS — Z79899 Other long term (current) drug therapy: Secondary | ICD-10-CM | POA: Diagnosis not present

## 2021-09-06 LAB — CBC WITH DIFFERENTIAL/PLATELET
Abs Immature Granulocytes: 0.02 10*3/uL (ref 0.00–0.07)
Basophils Absolute: 0 10*3/uL (ref 0.0–0.1)
Basophils Relative: 1 %
Eosinophils Absolute: 0 10*3/uL (ref 0.0–0.5)
Eosinophils Relative: 0 %
HCT: 44.4 % (ref 36.0–46.0)
Hemoglobin: 13.8 g/dL (ref 12.0–15.0)
Immature Granulocytes: 0 %
Lymphocytes Relative: 32 %
Lymphs Abs: 2.5 10*3/uL (ref 0.7–4.0)
MCH: 26.3 pg (ref 26.0–34.0)
MCHC: 31.1 g/dL (ref 30.0–36.0)
MCV: 84.7 fL (ref 80.0–100.0)
Monocytes Absolute: 0.5 10*3/uL (ref 0.1–1.0)
Monocytes Relative: 6 %
Neutro Abs: 4.9 10*3/uL (ref 1.7–7.7)
Neutrophils Relative %: 61 %
Platelets: 159 10*3/uL (ref 150–400)
RBC: 5.24 MIL/uL — ABNORMAL HIGH (ref 3.87–5.11)
RDW: 14.7 % (ref 11.5–15.5)
WBC: 8 10*3/uL (ref 4.0–10.5)
nRBC: 0 % (ref 0.0–0.2)

## 2021-09-06 LAB — MAGNESIUM: Magnesium: 2.6 mg/dL — ABNORMAL HIGH (ref 1.7–2.4)

## 2021-09-06 LAB — BLOOD GAS, VENOUS
Acid-Base Excess: 0.2 mmol/L (ref 0.0–2.0)
Bicarbonate: 25.8 mmol/L (ref 20.0–28.0)
O2 Saturation: 85.2 %
Patient temperature: 98.6
pCO2, Ven: 47.8 mmHg (ref 44.0–60.0)
pH, Ven: 7.352 (ref 7.250–7.430)
pO2, Ven: 52.4 mmHg — ABNORMAL HIGH (ref 32.0–45.0)

## 2021-09-06 LAB — COMPREHENSIVE METABOLIC PANEL
ALT: 14 U/L (ref 0–44)
AST: 32 U/L (ref 15–41)
Albumin: 3.9 g/dL (ref 3.5–5.0)
Alkaline Phosphatase: 79 U/L (ref 38–126)
Anion gap: 13 (ref 5–15)
BUN: 8 mg/dL (ref 6–20)
CO2: 24 mmol/L (ref 22–32)
Calcium: 8.9 mg/dL (ref 8.9–10.3)
Chloride: 98 mmol/L (ref 98–111)
Creatinine, Ser: 1.07 mg/dL — ABNORMAL HIGH (ref 0.44–1.00)
GFR, Estimated: >60 mL/min (ref >60)
Glucose, Bld: 145 mg/dL — ABNORMAL HIGH (ref 70–99)
Potassium: 2.7 mmol/L — CL (ref 3.5–5.1)
Sodium: 135 mmol/L (ref 135–145)
Total Bilirubin: 0.8 mg/dL (ref 0.3–1.2)
Total Protein: 9.3 g/dL — ABNORMAL HIGH (ref 6.5–8.1)

## 2021-09-06 LAB — CBC
HCT: 43.4 % (ref 36.0–46.0)
Hemoglobin: 13.7 g/dL (ref 12.0–15.0)
MCH: 27 pg (ref 26.0–34.0)
MCHC: 31.6 g/dL (ref 30.0–36.0)
MCV: 85.4 fL (ref 80.0–100.0)
Platelets: 165 10*3/uL (ref 150–400)
RBC: 5.08 MIL/uL (ref 3.87–5.11)
RDW: 14.6 % (ref 11.5–15.5)
WBC: 8.3 10*3/uL (ref 4.0–10.5)
nRBC: 0 % (ref 0.0–0.2)

## 2021-09-06 LAB — TROPONIN I (HIGH SENSITIVITY)
Troponin I (High Sensitivity): 13 ng/L (ref <18)
Troponin I (High Sensitivity): 12 ng/L (ref <18)

## 2021-09-06 LAB — RESP PANEL BY RT-PCR (FLU A&B, COVID) ARPGX2
Influenza A by PCR: NEGATIVE
Influenza B by PCR: NEGATIVE
SARS Coronavirus 2 by RT PCR: NEGATIVE

## 2021-09-06 LAB — BRAIN NATRIURETIC PEPTIDE: B Natriuretic Peptide: 79.8 pg/mL (ref 0.0–100.0)

## 2021-09-06 LAB — CREATININE, SERUM
Creatinine, Ser: 1.59 mg/dL — ABNORMAL HIGH (ref 0.44–1.00)
GFR, Estimated: 39 mL/min — ABNORMAL LOW (ref >60)

## 2021-09-06 LAB — MRSA NEXT GEN BY PCR, NASAL: MRSA by PCR Next Gen: NOT DETECTED

## 2021-09-06 LAB — HIV ANTIBODY (ROUTINE TESTING W REFLEX): HIV Screen 4th Generation wRfx: NONREACTIVE

## 2021-09-06 MED ORDER — SODIUM CHLORIDE 0.9 % IV SOLN
500.0000 mg | INTRAVENOUS | Status: DC
  Administered 2021-09-06 – 2021-09-07 (×2): 500 mg via INTRAVENOUS
  Filled 2021-09-06 (×2): qty 500

## 2021-09-06 MED ORDER — TRAMADOL HCL 50 MG PO TABS
50.0000 mg | ORAL_TABLET | Freq: Once | ORAL | Status: AC
  Administered 2021-09-06: 50 mg via ORAL
  Filled 2021-09-06: qty 1

## 2021-09-06 MED ORDER — CHLORHEXIDINE GLUCONATE CLOTH 2 % EX PADS
6.0000 | MEDICATED_PAD | Freq: Every day | CUTANEOUS | Status: DC
  Administered 2021-09-06 – 2021-09-11 (×6): 6 via TOPICAL

## 2021-09-06 MED ORDER — ALBUTEROL (5 MG/ML) CONTINUOUS INHALATION SOLN
15.0000 mg/h | INHALATION_SOLUTION | Freq: Once | RESPIRATORY_TRACT | Status: AC
  Administered 2021-09-06: 15 mg/h via RESPIRATORY_TRACT

## 2021-09-06 MED ORDER — POTASSIUM CHLORIDE 10 MEQ/100ML IV SOLN
10.0000 meq | INTRAVENOUS | Status: AC
  Administered 2021-09-06 (×3): 10 meq via INTRAVENOUS
  Filled 2021-09-06 (×3): qty 100

## 2021-09-06 MED ORDER — CHLORHEXIDINE GLUCONATE 0.12% ORAL RINSE (MEDLINE KIT)
15.0000 mL | Freq: Two times a day (BID) | OROMUCOSAL | Status: DC
  Administered 2021-09-06 – 2021-09-10 (×8): 15 mL via OROMUCOSAL

## 2021-09-06 MED ORDER — SODIUM CHLORIDE 0.9 % IV SOLN
2.0000 g | INTRAVENOUS | Status: AC
  Administered 2021-09-06 – 2021-09-10 (×5): 2 g via INTRAVENOUS
  Filled 2021-09-06 (×6): qty 20

## 2021-09-06 MED ORDER — IPRATROPIUM-ALBUTEROL 0.5-2.5 (3) MG/3ML IN SOLN
3.0000 mL | Freq: Four times a day (QID) | RESPIRATORY_TRACT | Status: DC
  Administered 2021-09-06 – 2021-09-07 (×6): 3 mL via RESPIRATORY_TRACT
  Filled 2021-09-06 (×5): qty 3

## 2021-09-06 MED ORDER — MELATONIN 5 MG PO TABS
5.0000 mg | ORAL_TABLET | Freq: Once | ORAL | Status: AC
  Administered 2021-09-06: 5 mg via ORAL
  Filled 2021-09-06: qty 1

## 2021-09-06 MED ORDER — ACETAMINOPHEN 650 MG RE SUPP: 650.0000 mg | Freq: Four times a day (QID) | RECTAL | Status: DC | PRN

## 2021-09-06 MED ORDER — SODIUM CHLORIDE 0.9 % IV SOLN: 500.0000 mg | Freq: Once | INTRAVENOUS | Status: DC

## 2021-09-06 MED ORDER — METHYLPREDNISOLONE SODIUM SUCC 125 MG IJ SOLR
60.0000 mg | Freq: Two times a day (BID) | INTRAMUSCULAR | Status: AC
  Administered 2021-09-06 – 2021-09-07 (×2): 60 mg via INTRAVENOUS
  Filled 2021-09-06 (×2): qty 2

## 2021-09-06 MED ORDER — SODIUM CHLORIDE 0.9 % IV SOLN
1.0000 g | Freq: Once | INTRAVENOUS | Status: DC
  Filled 2021-09-06: qty 10

## 2021-09-06 MED ORDER — ENOXAPARIN SODIUM 40 MG/0.4ML IJ SOSY
40.0000 mg | PREFILLED_SYRINGE | INTRAMUSCULAR | Status: DC
  Administered 2021-09-06 – 2021-09-10 (×5): 40 mg via SUBCUTANEOUS
  Filled 2021-09-06 (×4): qty 0.4

## 2021-09-06 MED ORDER — PREDNISONE 20 MG PO TABS
40.0000 mg | ORAL_TABLET | Freq: Every day | ORAL | Status: AC
  Administered 2021-09-07 – 2021-09-10 (×4): 40 mg via ORAL
  Filled 2021-09-06 (×4): qty 2

## 2021-09-06 MED ORDER — ONDANSETRON HCL 4 MG/2ML IJ SOLN: 4.0000 mg | Freq: Four times a day (QID) | INTRAMUSCULAR | Status: DC | PRN

## 2021-09-06 MED ORDER — ONDANSETRON HCL 4 MG PO TABS: 4.0000 mg | ORAL_TABLET | Freq: Four times a day (QID) | ORAL | Status: DC | PRN

## 2021-09-06 MED ORDER — MORPHINE SULFATE (PF) 4 MG/ML IV SOLN
4.0000 mg | Freq: Once | INTRAVENOUS | Status: AC
  Administered 2021-09-06: 4 mg via INTRAVENOUS
  Filled 2021-09-06: qty 1

## 2021-09-06 MED ORDER — ACETAMINOPHEN 325 MG PO TABS
650.0000 mg | ORAL_TABLET | Freq: Four times a day (QID) | ORAL | Status: DC | PRN
  Administered 2021-09-06 – 2021-09-07 (×4): 650 mg via ORAL
  Filled 2021-09-06 (×4): qty 2

## 2021-09-06 MED ORDER — ONDANSETRON HCL 4 MG/2ML IJ SOLN
4.0000 mg | Freq: Once | INTRAMUSCULAR | Status: AC
  Administered 2021-09-06: 4 mg via INTRAVENOUS
  Filled 2021-09-06: qty 2

## 2021-09-06 MED ORDER — ORAL CARE MOUTH RINSE
15.0000 mL | Freq: Two times a day (BID) | OROMUCOSAL | Status: DC
  Administered 2021-09-06 – 2021-09-10 (×6): 15 mL via OROMUCOSAL

## 2021-09-06 NOTE — H&P (Signed)
History and Physical    Michele Parrish:[id number] DOB: [birth year] DOA: 09/06/2021  PCP: Storm Frisk, MD  Patient coming from: Home  Chief Complaint: dyspnea  HPI: Michele Parrish is a 55 y.o. female with medical history significant of chronic hypoxic respiratory failure on 6L [place], PAH, diastolic HF. Presenting with 5 days of dyspnea and cough. She felt congested about 5 days ago, so she took some OTC cough meds. It started to break the congestion up and she began having significant cough. She started getting more short of breath, so she used her inhalers/breathing tx more. She believe that he nebulizer may be broken because she didn't feel that it was being effective. She continued like this til last night when she called EMS. They brought her a nebulizer treatment from their machine and she seemed to feel better. She declined a trip to the ED at the time. However, her symptoms returned early this morning. EMS was called again. She was found to be hypoxic in to the 80's. She was brought to the ED for help. She denies any other aggravating or alleviating factors.    ED Course: CXR showed bronchopneumonia. She was started on abx; nebs. She was placed on Bipap. TRH was called for admission.   Review of Systems:  Denies palpitations, abdominal pain, N/V/D, fever, sick contacts. Reports chest pain w/ cough. Review of systems is otherwise negative for all not mentioned in HPI.   PMHx Past Medical History:  Diagnosis Date   Acute exacerbation of CHF (congestive heart failure) (HCC) 02/22/2020   Bronchitis    Pericardial effusion    Pulmonary hypertension (HCC)     PSHx Past Surgical History:  Procedure Laterality Date   ABDOMINAL HYSTERECTOMY     RIGHT HEART CATH N/A 02/23/2020   Procedure: RIGHT HEART CATH;  Surgeon: Iran Ouch, MD;  Location: [facility];  Service: Cardiovascular;  Laterality: N/A;    SocHx  reports that she quit smoking about 18 months ago. Her  smoking use included cigarettes. She has a 63.00 pack-year smoking history. She has never used smokeless tobacco. She reports current alcohol use. She reports that she does not use drugs.  No Known Allergies  FamHx Family History  Problem Relation Age of Onset   Prostate cancer Father    Stroke Father    Multiple sclerosis Mother     Prior to Admission medications   Medication Sig Start Date End Date Taking? Authorizing Provider  albuterol (VENTOLIN HFA) 108 (90 Base) MCG/ACT inhaler INHALE 2 PUFFS INTO THE LUNGS EVERY 4 HOURS AS NEEDED FOR WHEEZING OR SHORTNESS OF BREATH 03/26/20 03/26/21  Storm Frisk, MD  fluticasone-salmeterol (ADVAIR HFA) 230-21 MCG/ACT inhaler INHALE 2 PUFFS INTO THE LUNGS 2 (TWO) TIMES DAILY. 07/25/20 07/25/21  Storm Frisk, MD  furosemide (LASIX) 40 MG tablet TAKE 1 TABLET (40 MG TOTAL) BY MOUTH 2 (TWO) TIMES DAILY. 03/26/20 03/26/21  Storm Frisk, MD  gabapentin (NEURONTIN) 300 MG capsule TAKE 1 CAPSULE (300 MG TOTAL) BY MOUTH DAILY. 11/08/20 11/08/21  Storm Frisk, MD  gabapentin (NEURONTIN) 300 MG capsule TAKE 1 CAPSULE (300 MG TOTAL) BY MOUTH DAILY. 05/31/20 05/31/21  Rollene Rotunda, MD  ipratropium-albuterol (DUONEB) 0.5-2.5 (3) MG/3ML SOLN Take 3 mLs by nebulization every 6 (six) hours as needed. 03/26/20   Storm Frisk, MD  nicotine (NICODERM CQ - DOSED IN MG/24 HOURS) 14 mg/24hr patch Place 1 patch (14 mg total) onto the skin daily. 03/26/20  Storm Frisk, MD  nicotine polacrilex (COMMIT) 4 MG lozenge TAKE 1 LOZENGE (4 MG TOTAL) BY MOUTH AS NEEDED FOR SMOKING CESSATION. 12/12/20 12/12/21  Storm Frisk, MD  OXYGEN Inhale into the lungs.    [provider]  tiotropium (SPIRIVA) 18 MCG inhalation capsule PLACE 1 CAPSULE (18 MCG TOTAL) INTO INHALER AND INHALE DAILY. 03/26/20 03/26/21  Storm Frisk, MD    Physical Exam: Vitals:   09/06/21 0908 09/06/21 0930 09/06/21 1000 09/06/21 1041  BP:  108/83 117/86 126/87  Pulse: (!) 123 (!) 119  (!) 115 (!) 116  Resp: (!) 22 (!) 26 (!) 23 20  Temp:      TempSrc:      SpO2: 92% 100% 94% 91%  Weight:      Height:        General: 55 y.o. female resting in bed in NAD Eyes: PERRL, normal sclera ENMT: Nares patent w/o discharge, orophaynx clear, dentition normal, ears w/o discharge/lesions/ulcers Neck: Supple, trachea midline Cardiovascular: tachy, +S1, S2, no m/g/r, equal pulses throughout, reproducible pain Respiratory: diffuse wheeze, no rhales/rhonchi, increased WOB in bipap GI: BS+, NDNT, no masses noted, no organomegaly noted MSK: No e/c/c Skin: No rashes, bruises, ulcerations noted Neuro: A&O x 3, no focal deficits Psyc: Appropriate interaction and affect, calm/cooperative  Labs on Admission: I have personally reviewed following labs and imaging studies  CBC: Recent Labs  Lab 09/06/21 0900  WBC 8.0  NEUTROABS 4.9  HGB 13.8  HCT 44.4  MCV 84.7  PLT 159   Basic Metabolic Panel: Recent Labs  Lab 09/06/21 0900  NA 135  K 2.7*  CL 98  CO2 24  GLUCOSE 145*  BUN 8  CREATININE 1.07*  CALCIUM 8.9   GFR: Estimated Creatinine Clearance: 52.3 mL/min (A) (by C-G formula based on SCr of 1.07 mg/dL (H)). Liver Function Tests: Recent Labs  Lab 09/06/21 0900  AST 32  ALT 14  ALKPHOS 79  BILITOT 0.8  PROT 9.3*  ALBUMIN 3.9   No results for input(s): LIPASE, AMYLASE in the last 168 hours. No results for input(s): AMMONIA in the last 168 hours. Coagulation Profile: No results for input(s): INR, PROTIME in the last 168 hours. Cardiac Enzymes: No results for input(s): CKTOTAL, CKMB, CKMBINDEX, TROPONINI in the last 168 hours. BNP (last 3 results) No results for input(s): PROBNP in the last 8760 hours. HbA1C: No results for input(s): HGBA1C in the last 72 hours. CBG: No results for input(s): GLUCAP in the last 168 hours. Lipid Profile: No results for input(s): CHOL, HDL, LDLCALC, TRIG, CHOLHDL, LDLDIRECT in the last 72 hours. Thyroid Function Tests: No  results for input(s): TSH, T4TOTAL, FREET4, T3FREE, THYROIDAB in the last 72 hours. Anemia Panel: No results for input(s): VITAMINB12, FOLATE, FERRITIN, TIBC, IRON, RETICCTPCT in the last 72 hours. Urine analysis:    Component Value Date/Time   COLORURINE STRAW (A) 02/25/2020 1415   APPEARANCEUR CLEAR 02/25/2020 1415   LABSPEC 1.005 02/25/2020 1415   PHURINE 9.0 (H) 02/25/2020 1415   GLUCOSEU NEGATIVE 02/25/2020 1415   HGBUR NEGATIVE 02/25/2020 1415   BILIRUBINUR NEGATIVE 02/25/2020 1415   KETONESUR NEGATIVE 02/25/2020 1415   PROTEINUR NEGATIVE 02/25/2020 1415   UROBILINOGEN 1.0 10/06/2010 0637   NITRITE NEGATIVE 02/25/2020 1415   LEUKOCYTESUR NEGATIVE 02/25/2020 1415    Radiological Exams on Admission: DG Chest Port 1 View  Result Date: 09/06/2021 CLINICAL DATA:  Shortness of breath EXAM: PORTABLE CHEST 1 VIEW COMPARISON:  02/26/2020 FINDINGS: Normal heart size and mediastinal  contours. Generalized interstitial coarsening correlating with emphysema. Streaky density at the lung bases, increased on the right from prior. Vague reticular density over the right mid lung chest could be early pneumonia. No visible effusion or pneumothorax. IMPRESSION: COPD with bilateral atelectasis or bronchopneumonia. Followup PA and lateral chest X-ray is recommended in 3-4 weeks following trial of antibiotic therapy to ensure resolution. Electronically Signed   By: Tiburcio Pea M.D.   On: 09/06/2021 09:49    EKG: Independently reviewed. Sinus tach, LBBB  Assessment/Plan Acute on chronic hypoxic respiratory failure COPD exacerbation PNA     - admit to inpt, SDU     - continue Bipap; wean as able; baseline O2 use is 6L [place]     - continue rocephin, zithro     - COVID/flu pending     - steroids, nebs     - trp neg, no volume overload  Hypokalemia     - replace K+; check Mg2+  Tobacco abuse     - counsel against further use  Chronic diastolic HF     - no volume overload on exam     -  continue home regimen  DVT prophylaxis: lovenox  Code Status: DNR  Family Communication: None at bedside  Consults called: None   Status is: Inpatient  Remains inpatient appropriate because: severity of illness  Teddy Spike DO [facility]  If 7PM-7AM, please contact night-coverage [email or web address]  09/06/2021, 11:13 AM

## 2021-09-06 NOTE — ED Provider Notes (Signed)
[facility] DEPT Provider Note   CSN: [id number] Arrival date & time: 09/06/21  0844     History Chief Complaint  Patient presents with   Shortness of Breath    Kamaryn E Helget is a 55 y.o. female.  Patient is a 55 year old female with a history of CKD, COPD on 6 L of oxygen at baseline, chronic diastolic heart failure and pulmonary hypertension who is presenting today with complaints of shortness of breath.  All week long patient has had gradually worsening shortness of breath but it became severe this morning.  Patient reports on Monday she felt like she had some cough and congestion and she may be coming down with something but denies any fevers.  She started taking cough medicine midweek and reports since that time she just seems to be getting worse.  The cough has been nonproductive and the wheezing and shortness of breath has worsened.  She has tried to use her nebulizer at home but it did not have great output.  EMS came out to her home last night and gave her a treatment and she felt like it did help but then it worsened this morning.  When EMS got there this morning patient sats were 83% on neb and she was wheezing diffusely.  She received 3 rounds of albuterol and 2 rounds of Atrovent, 125 mg of Solu-Medrol and 2 g of magnesium.  Patient reports she feels slightly better as she is now able to say more than 2 words but she is still feeling very short of breath.  EMS has not been able to improve her sats above 88% on the neb.  Patient has not noticed any lower extremity swelling has not recently changed any of her medications and is currently not using tobacco products.  No known sick contacts.  She describes feeling sore all over in her chest from the cough and it is worse with coughing.  She continually says she just wants something to help her breathe.  The history is provided by the patient.  Shortness of Breath Severity:  Severe     Past Medical  History:  Diagnosis Date   Acute exacerbation of CHF (congestive heart failure) ([place]) 02/22/2020   Bronchitis    Pericardial effusion    Pulmonary hypertension ([place])     Patient Active Problem List   Diagnosis Date Noted   Absence of bladder continence 07/22/2020   Numbness in feet 06/20/2020   Chronic respiratory failure with hypoxia ([place]) 03/26/2020   CKD (chronic kidney disease), 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   Tobacco abuse 02/22/2020   Moderate to severe pulmonary hypertension ([place]) 02/22/2020   Pulmonary nodules 02/22/2020   Chronic diastolic heart failure ([place]) 02/22/2020   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     Family History  Problem Relation Age of Onset   Prostate cancer Father    Stroke Father    Multiple sclerosis Mother     Social History   Tobacco Use   Smoking status: Former    Packs/day: 1.50    Years: 42.00    Pack years: 63.00    Types: Cigarettes  Quit date: 02/18/2020    Years since quitting: 1.5   Smokeless tobacco: Never  Vaping Use   Vaping Use: Never used  Substance Use Topics   Alcohol use: Yes    Comment: 2 beers per day   Drug use: No    Home Medications Prior to Admission medications   Medication Sig Start Date End Date Taking? Authorizing Provider  albuterol (VENTOLIN HFA) 108 (90 Base) MCG/ACT inhaler INHALE 2 PUFFS INTO THE LUNGS EVERY 4 HOURS AS NEEDED FOR WHEEZING OR SHORTNESS OF BREATH 03/26/20 03/26/21  Elsie Stain, MD  fluticasone-salmeterol (ADVAIR HFA) 230-21 MCG/ACT inhaler INHALE 2 PUFFS INTO THE LUNGS 2 (TWO) TIMES DAILY. 07/25/20 07/25/21  Elsie Stain, MD  furosemide (LASIX) 40 MG tablet TAKE 1 TABLET (40 MG  TOTAL) BY MOUTH 2 (TWO) TIMES DAILY. 03/26/20 03/26/21  Elsie Stain, MD  gabapentin (NEURONTIN) 300 MG capsule TAKE 1 CAPSULE (300 MG TOTAL) BY MOUTH DAILY. 11/08/20 11/08/21  Elsie Stain, MD  gabapentin (NEURONTIN) 300 MG capsule TAKE 1 CAPSULE (300 MG TOTAL) BY MOUTH DAILY. 05/31/20 05/31/21  Minus Breeding, MD  ipratropium-albuterol (DUONEB) 0.5-2.5 (3) MG/3ML SOLN Take 3 mLs by nebulization every 6 (six) hours as needed. 03/26/20   Elsie Stain, MD  nicotine (NICODERM CQ - DOSED IN MG/24 HOURS) 14 mg/24hr patch Place 1 patch (14 mg total) onto the skin daily. 03/26/20   Elsie Stain, MD  nicotine polacrilex (COMMIT) 4 MG lozenge TAKE 1 LOZENGE (4 MG TOTAL) BY MOUTH AS NEEDED FOR SMOKING CESSATION. 12/12/20 12/12/21  Elsie Stain, MD  OXYGEN Inhale into the lungs.    [provider]  tiotropium (SPIRIVA) 18 MCG inhalation capsule PLACE 1 CAPSULE (18 MCG TOTAL) INTO INHALER AND INHALE DAILY. 03/26/20 03/26/21  Elsie Stain, MD    Allergies    Patient has no known allergies.  Review of Systems   Review of Systems  Respiratory:  Positive for shortness of breath.   All other systems reviewed and are negative.  Physical Exam Updated Vital Signs BP 108/83   Pulse (!) 119   Temp 98.1 F (36.7 C) (Oral)   Resp (!) 26   Ht 5\' 2"  (1.575 m)   Wt 61.2 kg   SpO2 100%   BMI 24.69 kg/m   Physical Exam Vitals and nursing note reviewed.  Constitutional:      General: She is in acute distress.     Appearance: She is well-developed. She is ill-appearing.  HENT:     Head: Normocephalic and atraumatic.  Eyes:     Pupils: Pupils are equal, round, and reactive to light.  Cardiovascular:     Rate and Rhythm: Regular rhythm. Tachycardia present.     Heart sounds: Normal heart sounds. No murmur heard.   No friction rub.  Pulmonary:     Effort: Tachypnea and accessory muscle usage present.     Breath sounds: Wheezing present. No rales.     Comments: Speaking in 3-4 word  sentences Abdominal:     General: Bowel sounds are normal. There is no distension.     Palpations: Abdomen is soft.     Tenderness: There is no abdominal tenderness. There is no guarding or rebound.  Musculoskeletal:        General: No tenderness. Normal range of motion.     Cervical back: Normal range of motion and neck supple.     Right lower leg: No edema.     Left lower leg:  No edema.     Comments: No edema  Skin:    General: Skin is warm and dry.     Findings: No rash.  Neurological:     Mental Status: She is alert and oriented to person, place, and time. Mental status is at baseline.     Cranial Nerves: No cranial nerve deficit.  Psychiatric:        Mood and Affect: Mood normal.        Behavior: Behavior normal.    ED Results / Procedures / Treatments   Labs (all labs ordered are listed, but only abnormal results are displayed) Labs Reviewed  CBC WITH DIFFERENTIAL/PLATELET - Abnormal; Notable for the following components:      Result Value   RBC 5.24 (*)    All other components within normal limits  BLOOD GAS, VENOUS - Abnormal; Notable for the following components:   pO2, Ven 52.4 (*)    All other components within normal limits  RESP PANEL BY RT-PCR (FLU A&B, COVID) [id number]  COMPREHENSIVE METABOLIC PANEL  BRAIN NATRIURETIC PEPTIDE  TROPONIN I (HIGH SENSITIVITY)    EKG EKG Interpretation  Date/Time:  Saturday September 06 2021 08:55:50 EST Ventricular Rate:  124 PR Interval:  151 QRS Duration: 129 QT Interval:  335 QTC Calculation: 482 R Axis:   51 Text Interpretation: Sinus tachycardia , new Left bundle branch block Confirmed by Blanchie Dessert [id number]) on 09/06/2021 9:15:43 AM  Radiology DG Chest Port 1 View  Result Date: 09/06/2021 CLINICAL DATA:  Shortness of breath EXAM: PORTABLE CHEST 1 VIEW COMPARISON:  02/26/2020 FINDINGS: Normal heart size and mediastinal contours. Generalized interstitial coarsening correlating with emphysema. Streaky density at  the lung bases, increased on the right from prior. Vague reticular density over the right mid lung chest could be early pneumonia. No visible effusion or pneumothorax. IMPRESSION: COPD with bilateral atelectasis or bronchopneumonia. Followup PA and lateral chest X-ray is recommended in 3-4 weeks following trial of antibiotic therapy to ensure resolution. Electronically Signed   By: Jorje Guild M.D.   On: 09/06/2021 09:49    Procedures Procedures   Medications Ordered in ED Medications  morphine 4 MG/ML injection 4 mg (4 mg Intravenous Given 09/06/21 0948)  ondansetron (ZOFRAN) injection 4 mg (4 mg Intravenous Given 09/06/21 0948)    ED Course  I have reviewed the triage vital signs and the nursing notes.  Pertinent labs & imaging results that were available during my care of the patient were reviewed by me and considered in my medical decision making (see chart for details).    MDM Rules/Calculators/A&P                           Patient is a 55 year old female with multiple medical problems presenting today with shortness of breath.  Patient symptoms have worsened over the last week.  She is tachypneic, wheezing throughout and hypoxic at 87% on and a neb at 15 L.  She is already received albuterol, Atrovent, Solu-Medrol and magnesium by paramedics.  She does report slight improvement in symptoms after this.  She has had some cough and congestion but denies any fever.  Concern for viral etiology versus pneumonia in addition to COPD exacerbation.  No prior history of PE or DVT patient is not anticoagulated.  Patient does have history of CHF but echo done in 2021 showed an EF of 65 to 70%.  Patient does not have distal edema and low suspicion for CHF  exacerbation today.  Given patient has only had mild improvement with above therapies will start BiPAP to help her work of breathing.  We will do continuous albuterol neb.  Labs, imaging, COVID and flu testing pending.  9:59 AM Patient's EKG  shows a new left bundle branch block, chest x-ray with possible bilateral atelectasis versus bronchopneumonia.  CBC within normal limits, venous blood gas without acute findings, after BiPAP placed patient is now breathing more comfortably.  Heart rate is improving and work of breathing.  Oxygen saturation of 93 to 94%.  We will start continuous albuterol nebulizer as patient is still having diffuse wheezing.  11:03 AM Troponin is within normal limits, COVID and flu are still pending.  Patient continues to have improved work of breathing.  Will cover with Rocephin and azithromycin related to the concern for bronchopneumonia and patient's report of cough and congestion over the last 1 week.  Will admit for further care.  MDM   Amount and/or Complexity of Data Reviewed Clinical lab tests: ordered and reviewed Tests in the radiology section of CPT: ordered and reviewed Tests in the medicine section of CPT: ordered and reviewed Decide to obtain previous medical records or to obtain history from someone other than the patient: yes Obtain history from someone other than the patient: yes Review and summarize past medical records: yes Discuss the patient with other providers: yes Independent visualization of images, tracings, or specimens: yes  Risk of Complications, Morbidity, and/or Mortality Presenting problems: high Diagnostic procedures: high Management options: high  Patient Progress Patient progress: improved   CRITICAL CARE Performed by: Leone Mobley Total critical care time: 40 minutes Critical care time was exclusive of separately billable procedures and treating other patients. Critical care was necessary to treat or prevent imminent or life-threatening deterioration. Critical care was time spent personally by me on the following activities: development of treatment plan with patient and/or surrogate as well as nursing, discussions with consultants, evaluation of patient's  response to treatment, examination of patient, obtaining history from patient or surrogate, ordering and performing treatments and interventions, ordering and review of laboratory studies, ordering and review of radiographic studies, pulse oximetry and re-evaluation of patient's condition.   Final Clinical Impression(s) / ED Diagnoses Final diagnoses:  COPD exacerbation (HCC)  Acute respiratory failure with hypoxia (HCC)  Community acquired pneumonia, unspecified laterality    Rx / DC Orders ED Discharge Orders     None        Gwyneth Sprout, MD 09/06/21 1105

## 2021-09-06 NOTE — ED Triage Notes (Signed)
Pt BIBA from home for increasing SHOb over the last week with congestion. Home neb not working correctly. EMS did neb last night but pt unable to do more since then. Endorses chest pain across the top with inspiration. Wheezes in all fields. SpO2 83% on neb. 6L O2 [place] at baseline. 20ga LAC.  15 alb, 1mg  atrovent en route. 125mg  solumedrol, 2g mag  BP 140/88 HR 120-130 SpO2 88% on 10L [place]

## 2021-09-06 NOTE — Progress Notes (Signed)
Patient placed on BIPAP due to respiratory distress. She appears to be tolerating well at this time. She is stable with saturation=92% RT will continue to monitor

## 2021-09-07 ENCOUNTER — Other Ambulatory Visit: Payer: Self-pay

## 2021-09-07 ENCOUNTER — Encounter (HOSPITAL_COMMUNITY): Payer: Self-pay | Admitting: Internal Medicine

## 2021-09-07 DIAGNOSIS — J180 Bronchopneumonia, unspecified organism: Secondary | ICD-10-CM | POA: Diagnosis present

## 2021-09-07 DIAGNOSIS — Z720 Tobacco use: Secondary | ICD-10-CM

## 2021-09-07 DIAGNOSIS — I5032 Chronic diastolic (congestive) heart failure: Secondary | ICD-10-CM

## 2021-09-07 DIAGNOSIS — J189 Pneumonia, unspecified organism: Secondary | ICD-10-CM

## 2021-09-07 DIAGNOSIS — I272 Pulmonary hypertension, unspecified: Secondary | ICD-10-CM

## 2021-09-07 DIAGNOSIS — J441 Chronic obstructive pulmonary disease with (acute) exacerbation: Secondary | ICD-10-CM

## 2021-09-07 HISTORY — DX: Bronchopneumonia, unspecified organism: J18.0

## 2021-09-07 LAB — COMPREHENSIVE METABOLIC PANEL
ALT: 11 U/L (ref 0–44)
AST: 31 U/L (ref 15–41)
Albumin: 3.3 g/dL — ABNORMAL LOW (ref 3.5–5.0)
Alkaline Phosphatase: 65 U/L (ref 38–126)
Anion gap: 6 (ref 5–15)
BUN: 24 mg/dL — ABNORMAL HIGH (ref 6–20)
CO2: 26 mmol/L (ref 22–32)
Calcium: 8.5 mg/dL — ABNORMAL LOW (ref 8.9–10.3)
Chloride: 103 mmol/L (ref 98–111)
Creatinine, Ser: 1.22 mg/dL — ABNORMAL HIGH (ref 0.44–1.00)
GFR, Estimated: 53 mL/min — ABNORMAL LOW (ref >60)
Glucose, Bld: 132 mg/dL — ABNORMAL HIGH (ref 70–99)
Potassium: 4.1 mmol/L (ref 3.5–5.1)
Sodium: 135 mmol/L (ref 135–145)
Total Bilirubin: 0.3 mg/dL (ref 0.3–1.2)
Total Protein: 8.2 g/dL — ABNORMAL HIGH (ref 6.5–8.1)

## 2021-09-07 LAB — CBC
HCT: 39.1 % (ref 36.0–46.0)
Hemoglobin: 12 g/dL (ref 12.0–15.0)
MCH: 26.4 pg (ref 26.0–34.0)
MCHC: 30.7 g/dL (ref 30.0–36.0)
MCV: 85.9 fL (ref 80.0–100.0)
Platelets: 158 10*3/uL (ref 150–400)
RBC: 4.55 MIL/uL (ref 3.87–5.11)
RDW: 14.7 % (ref 11.5–15.5)
WBC: 9.3 10*3/uL (ref 4.0–10.5)
nRBC: 0 % (ref 0.0–0.2)

## 2021-09-07 MED ORDER — CHLORHEXIDINE GLUCONATE 0.12 % MT SOLN
OROMUCOSAL | Status: AC
  Administered 2021-09-07: 15 mL via OROMUCOSAL
  Filled 2021-09-07: qty 15

## 2021-09-07 MED ORDER — GUAIFENESIN ER 600 MG PO TB12
600.0000 mg | ORAL_TABLET | Freq: Two times a day (BID) | ORAL | Status: DC
  Administered 2021-09-07: 600 mg via ORAL
  Filled 2021-09-07 (×6): qty 1

## 2021-09-07 MED ORDER — INFLUENZA VAC SPLIT QUAD 0.5 ML IM SUSY
0.5000 mL | PREFILLED_SYRINGE | INTRAMUSCULAR | Status: AC
  Administered 2021-09-10: 0.5 mL via INTRAMUSCULAR
  Filled 2021-09-07: qty 0.5

## 2021-09-07 MED ORDER — BUDESONIDE 0.5 MG/2ML IN SUSP
0.5000 mg | Freq: Two times a day (BID) | RESPIRATORY_TRACT | Status: DC
  Administered 2021-09-07 – 2021-09-11 (×9): 0.5 mg via RESPIRATORY_TRACT
  Filled 2021-09-07 (×9): qty 2

## 2021-09-07 MED ORDER — AZITHROMYCIN 250 MG PO TABS
500.0000 mg | ORAL_TABLET | Freq: Every day | ORAL | Status: AC
  Administered 2021-09-08 – 2021-09-10 (×3): 500 mg via ORAL
  Filled 2021-09-07 (×3): qty 2

## 2021-09-07 MED ORDER — NAPHAZOLINE-GLYCERIN 0.012-0.25 % OP SOLN
1.0000 [drp] | Freq: Four times a day (QID) | OPHTHALMIC | Status: DC | PRN
  Administered 2021-09-07 – 2021-09-08 (×2): 2 [drp] via OPHTHALMIC
  Filled 2021-09-07 (×3): qty 15

## 2021-09-07 MED ORDER — BENZONATATE 100 MG PO CAPS
100.0000 mg | ORAL_CAPSULE | Freq: Three times a day (TID) | ORAL | Status: DC | PRN
  Administered 2021-09-07: 100 mg via ORAL
  Filled 2021-09-07: qty 1

## 2021-09-07 MED ORDER — ARFORMOTEROL TARTRATE 15 MCG/2ML IN NEBU
15.0000 ug | INHALATION_SOLUTION | Freq: Two times a day (BID) | RESPIRATORY_TRACT | Status: DC
  Administered 2021-09-07 – 2021-09-11 (×8): 15 ug via RESPIRATORY_TRACT
  Filled 2021-09-07 (×10): qty 2

## 2021-09-07 NOTE — Telephone Encounter (Signed)
Pt is currently admitted

## 2021-09-07 NOTE — Progress Notes (Signed)
PT Cancellation Note  Patient Details Name: Michele Parrish MRN: [id number] DOB: Mar 03, 1966   Cancelled Treatment:     PT order received but eval deferred this date - RN advises pt just up from ICU and SOB at rest.  Will follow.    Jaton Eilers 09/07/2021, 3:14 PM

## 2021-09-07 NOTE — Progress Notes (Signed)
PROGRESS NOTE    Michele Parrish  NID:[id number] DOB: 1966/10/14 DOA: 09/06/2021 PCP: Elsie Stain, MD    Brief Narrative:  Michele Parrish is a 55 year old female with past medical history significant for chronic hypoxic respiratory failure on 6 L nasal cannula at baseline, COPD, pulmonary arterial hypertension, chronic diastolic congestive heart failure, tobacco use disorder who presents to [facility] ED on 11/12 for progressive shortness of breath and cough.  Onset 5 days prior, took OTC cough meds with some mild improvement.  She continues with shortness of breath, attempted to use her home inhaler/nebulizers but believes her nebulizer machine is broken and ineffective.  Her breathing continued to decline and EMS was activated.  On EMS arrival she was found to be hypoxic with SPO2's reported in the 80s and was brought to the ED for further evaluation.  In the ED, temperature 98.1 F, HR 122, RR 30, BP 150/118, SPO2 87% on 15 L nasal cannula.  VBG with pH 7.35, PCO2 47.8, PO2 52.4.  Sodium 135, potassium 2.7, chloride 98, CO2 24, BUN 8, creatinine 1.07, glucose 145.  LFTs within normal limits.  WBC 8.0, hemoglobin 13.8, platelets 159.  High sensitive troponin 13> 12.  COVID-19 PCR negative.  Influenza A/B PCR negative.  MRSA PCR negative.  Chest x-ray with COPD with bilateral atelectasis versus bronchopneumonia.  Patient was started on empiric antibiotics, placed on BiPAP.  Hospitalist service consulted for further evaluation and management of acute on chronic hypoxic respite failure secondary to pneumonia versus COPD exacerbation.   Assessment & Plan:   Active Problems:   COPD with acute exacerbation (HCC)   Tobacco abuse   Moderate to severe pulmonary hypertension (HCC)   Chronic diastolic heart failure (HCC)   Acute on chronic respiratory failure with hypoxia (HCC)   Acute on chronic hypoxic respiratory failure, POA Patient presenting with progressive shortness of breath associated  with cough.  Not improved with home inhalers, OTC cough medicine and nebulizer treatments.  Patient was found to be hypoxic on her home oxygen as reported by EMS with SPO2 in the 80s.  Placed on 15 L nasal cannula and finally on BiPAP.  Chest x-ray consistent with likely bronchopneumonia coupled with acute COPD exacerbation. --Continue treatment as below  Community-acquired pneumonia Patient presenting with increasing shortness of breath, productive cough.  Chest x-ray consistent with bronchopneumonia.  Patient is afebrile without leukocytosis. --Azithromycin 500 mg daily x 5 days --Ceftriaxone 2 g IV q24h x 5 days --Mucinex 600 mg q12h  COPD exacerbation --Brovana nebs BID --Pulmicort neb BID --Prednisone 40 mg p.o. daily to complete 5-day steroid course --DuoNebs as needed shortness of breath/wheezing --Continue antibiotics as above --DME order for new nebulizer machine for home use, TOC for assistance --Continue supplemental oxygen, maintain SPO2 greater than 88% (on 5L [place] during day and 6L at night --BiPAP as needed  Hypokalemia Potassium 2.7 admission, repleted.  Potassium 4.1 this morning, within normal limits. --Continue monitor electrolytes daily  Urinary retention Patient with bladder distention and urinary retention noted on bladder scan. --Foley catheter placed; attempt voiding trial tomorrow  Chronic diastolic congestive heart failure, compensated On furosemide 40 mg p.o. daily at baseline.  Appears euvolemic. --Hold furosemide for now --Strict I's and O's and daily weights  CKD stage II Baseline creatinine appears to be 1.0-1.3.,  Stable --Avoid nephrotoxins, renal dose all medications --BMP in a.m.  Tobacco use disorder Counseled on need for complete cessation  Weakness/deconditioning: --PT evaluation   DVT prophylaxis: enoxaparin (LOVENOX) injection  40 mg Start: 09/06/21 1800   Code Status: DNR Family Communication: No family present at bedside this  morning  Disposition Plan:  Level of care: Med-Surg Status is: Inpatient  Remains inpatient appropriate because: Continues on IV antibiotics, will need TOC evaluation for new nebulizer machine, scheduled neb treatments     Consultants:  None  Procedures:  BiPAP  Antimicrobials:  Azithromycin 11/12>> Ceftriaxone 11/12>>    Subjective: Patient seen examined at bedside, resting comfortably.  Just completed neb treatment, RT at bedside.  Oxygen need has decreased and close to her normal baseline.  Complains of some mild wheezing and shortness of breath that continues.  Patient reports that she is requesting case management assistance for obtaining a new nebulizer machine which hers is apparently broken at home.  Also complained of some eye irritation this morning, now has resolved.  No other questions or concerns at this time.  Denies headache, no dizziness, no chest pain, no palpitations, no fever/chills/night sweats, no nausea/vomiting/diarrhea, abdominal pain, no  paresthesias.  No acute events overnight per nursing staff other than urinary retention which Foley catheter was placed.  Stable for transfer to [place] floor.  Objective: Vitals:   09/07/21 0753 09/07/21 0800 09/07/21 0900 09/07/21 0906  BP:  97/69 (!) 88/45 (!) 98/51  Pulse:  98 (!) 113   Resp:  19 (!) 21 20  Temp: 97.7 F (36.5 C)     TempSrc: Axillary     SpO2:  100%  97%  Weight:      Height:        Intake/Output Summary (Last 24 hours) at 09/07/2021 1022 Last data filed at 09/07/2021 0600 Gross per 24 hour  Intake 637.29 ml  Output 650 ml  Net -12.71 ml   Filed Weights   09/06/21 0859  Weight: 61.2 kg    Examination:  General exam: Appears calm and comfortable, chronically ill appearance, appears older than stated age Respiratory system: Slightly decreased breath sounds bilateral bases, late expiratory wheezing bilaterally, no accessory muscle use, on 3 L nasal cannula with SPO2 96% at  rest Cardiovascular system: S1 & S2 heard, RRR. No JVD, murmurs, rubs, gallops or clicks. No pedal edema. Gastrointestinal system: Abdomen is nondistended, soft and nontender. No organomegaly or masses felt. Normal bowel sounds heard.  Foley catheter noted with clear yellow urine in collection bag Central nervous system: Alert and oriented. No focal neurological deficits. Extremities: Symmetric 5 x 5 power. Skin: No rashes, lesions or ulcers Psychiatry: Judgement and insight appear poor. Mood & affect appropriate.     Data Reviewed: I have personally reviewed following labs and imaging studies  CBC: Recent Labs  Lab 09/06/21 0900 09/06/21 1341 09/07/21 0309  WBC 8.0 8.3 9.3  NEUTROABS 4.9  --   --   HGB 13.8 13.7 12.0  HCT 44.4 43.4 39.1  MCV 84.7 85.4 85.9  PLT 159 165 324   Basic Metabolic Panel: Recent Labs  Lab 09/06/21 0900 09/06/21 1341 09/07/21 0309  NA 135  --  135  K 2.7*  --  4.1  CL 98  --  103  CO2 24  --  26  GLUCOSE 145*  --  132*  BUN 8  --  24*  CREATININE 1.07* 1.59* 1.22*  CALCIUM 8.9  --  8.5*  MG  --  2.6*  --    GFR: Estimated Creatinine Clearance: 45.9 mL/min (A) (by C-G formula based on SCr of 1.22 mg/dL (H)). Liver Function Tests: Recent Labs  Lab  09/06/21 0900 09/07/21 0309  AST 32 31  ALT 14 11  ALKPHOS 79 65  BILITOT 0.8 0.3  PROT 9.3* 8.2*  ALBUMIN 3.9 3.3*   No results for input(s): LIPASE, AMYLASE in the last 168 hours. No results for input(s): AMMONIA in the last 168 hours. Coagulation Profile: No results for input(s): INR, PROTIME in the last 168 hours. Cardiac Enzymes: No results for input(s): CKTOTAL, CKMB, CKMBINDEX, TROPONINI in the last 168 hours. BNP (last 3 results) No results for input(s): PROBNP in the last 8760 hours. HbA1C: No results for input(s): HGBA1C in the last 72 hours. CBG: No results for input(s): GLUCAP in the last 168 hours. Lipid Profile: No results for input(s): CHOL, HDL, LDLCALC, TRIG,  CHOLHDL, LDLDIRECT in the last 72 hours. Thyroid Function Tests: No results for input(s): TSH, T4TOTAL, FREET4, T3FREE, THYROIDAB in the last 72 hours. Anemia Panel: No results for input(s): VITAMINB12, FOLATE, FERRITIN, TIBC, IRON, RETICCTPCT in the last 72 hours. Sepsis Labs: No results for input(s): PROCALCITON, LATICACIDVEN in the last 168 hours.  Recent Results (from the past 240 hour(s))  Resp Panel by RT-PCR (Flu A&B, Covid) Nasopharyngeal Swab     Status: None   Collection Time: 09/06/21  9:00 AM   Specimen: Nasopharyngeal Swab; Nasopharyngeal(NP) swabs in vial transport medium  Result Value Ref Range Status   SARS Coronavirus 2 by RT PCR NEGATIVE NEGATIVE Final    Comment: (NOTE) SARS-CoV-2 target nucleic acids are NOT DETECTED.  The SARS-CoV-2 RNA is generally detectable in upper respiratory specimens during the acute phase of infection. The lowest concentration of SARS-CoV-2 viral copies this assay can detect is 138 copies/mL. A negative result does not preclude SARS-Cov-2 infection and should not be used as the sole basis for treatment or other patient management decisions. A negative result may occur with  improper specimen collection/handling, submission of specimen other than nasopharyngeal swab, presence of viral mutation(s) within the areas targeted by this assay, and inadequate number of viral copies(<138 copies/mL). A negative result must be combined with clinical observations, patient history, and epidemiological information. The expected result is Negative.  Fact Sheet for Patients:  EntrepreneurPulse.com.au  Fact Sheet for Healthcare Providers:  IncredibleEmployment.be  This test is no t yet approved or cleared by the [place] FDA and  has been authorized for detection and/or diagnosis of SARS-CoV-2 by FDA under an Emergency Use Authorization (EUA). This EUA will remain  in effect (meaning this test can be used) for  the duration of the COVID-19 declaration under Section 564(b)(1) of the Act, 21 U.S.C.section 360bbb-3(b)(1), unless the authorization is terminated  or revoked sooner.       Influenza A by PCR NEGATIVE NEGATIVE Final   Influenza B by PCR NEGATIVE NEGATIVE Final    Comment: (NOTE) The Xpert Xpress SARS-CoV-2/FLU/RSV plus assay is intended as an aid in the diagnosis of influenza from Nasopharyngeal swab specimens and should not be used as a sole basis for treatment. Nasal washings and aspirates are unacceptable for Xpert Xpress SARS-CoV-2/FLU/RSV testing.  Fact Sheet for Patients: EntrepreneurPulse.com.au  Fact Sheet for Healthcare Providers: IncredibleEmployment.be  This test is not yet approved or cleared by the [place] FDA and has been authorized for detection and/or diagnosis of SARS-CoV-2 by FDA under an Emergency Use Authorization (EUA). This EUA will remain in effect (meaning this test can be used) for the duration of the COVID-19 declaration under Section 564(b)(1) of the Act, 21 U.S.C. section 360bbb-3(b)(1), unless the authorization is terminated or revoked.  Performed at [facility], [place] [address], [place], [place] [address]   MRSA Next Gen by PCR, Nasal     Status: None   Collection Time: 09/06/21  1:06 PM   Specimen: Nasal Mucosa; Nasal Swab  Result Value Ref Range Status   MRSA by PCR Next Gen NOT DETECTED NOT DETECTED Final    Comment: (NOTE) The GeneXpert MRSA Assay (FDA approved for NASAL specimens only), is one component of a comprehensive MRSA colonization surveillance program. It is not intended to diagnose MRSA infection nor to guide or monitor treatment for MRSA infections. Test performance is not FDA approved in patients less than 43 years old. Performed at [facility], [place] [address] [place], [place] [address]          Radiology Studies: [facility] Chest Port 1  View  Result Date: 09/06/2021 CLINICAL DATA:  Shortness of breath EXAM: PORTABLE CHEST 1 VIEW COMPARISON:  02/26/2020 FINDINGS: Normal heart size and mediastinal contours. Generalized interstitial coarsening correlating with emphysema. Streaky density at the lung bases, increased on the right from prior. Vague reticular density over the right mid lung chest could be early pneumonia. No visible effusion or pneumothorax. IMPRESSION: COPD with bilateral atelectasis or bronchopneumonia. Followup PA and lateral chest X-ray is recommended in 3-4 weeks following trial of antibiotic therapy to ensure resolution. Electronically Signed   By: Jorje Guild M.D.   On: 09/06/2021 09:49        Scheduled Meds:  arformoterol  15 mcg Nebulization BID   budesonide (PULMICORT) nebulizer solution  0.5 mg Nebulization BID   chlorhexidine       chlorhexidine gluconate (MEDLINE KIT)  15 mL Mouth Rinse BID   Chlorhexidine Gluconate Cloth  6 each Topical Daily   enoxaparin (LOVENOX) injection  40 mg Subcutaneous Q24H   ipratropium-albuterol  3 mL Nebulization Q6H   mouth rinse  15 mL Mouth Rinse q12n4p   predniSONE  40 mg Oral Q breakfast   Continuous Infusions:  azithromycin Stopped (09/06/21 1734)   cefTRIAXone (ROCEPHIN)  IV Stopped (09/06/21 1609)     LOS: 1 day    Time spent: 46 minutes spent on chart review, discussion with nursing staff, consultants, updating family and interview/physical exam; more than 50% of that time was spent in counseling and/or coordination of care.    Eric J [place] DO [facility] Available via Epic secure chat 7am-7pm After these hours, please refer to coverage provider listed on [email or web address] 09/07/2021, 10:22 AM

## 2021-09-08 DIAGNOSIS — H109 Unspecified conjunctivitis: Secondary | ICD-10-CM | POA: Diagnosis not present

## 2021-09-08 DIAGNOSIS — H1032 Unspecified acute conjunctivitis, left eye: Secondary | ICD-10-CM

## 2021-09-08 DIAGNOSIS — J180 Bronchopneumonia, unspecified organism: Principal | ICD-10-CM

## 2021-09-08 LAB — BASIC METABOLIC PANEL
Anion gap: 7 (ref 5–15)
BUN: 20 mg/dL (ref 6–20)
CO2: 29 mmol/L (ref 22–32)
Calcium: 8.7 mg/dL — ABNORMAL LOW (ref 8.9–10.3)
Chloride: 102 mmol/L (ref 98–111)
Creatinine, Ser: 0.77 mg/dL (ref 0.44–1.00)
GFR, Estimated: >60 mL/min (ref >60)
Glucose, Bld: 118 mg/dL — ABNORMAL HIGH (ref 70–99)
Potassium: 4.3 mmol/L (ref 3.5–5.1)
Sodium: 138 mmol/L (ref 135–145)

## 2021-09-08 LAB — MAGNESIUM: Magnesium: 2.3 mg/dL (ref 1.7–2.4)

## 2021-09-08 MED ORDER — TRAMADOL HCL 50 MG PO TABS
50.0000 mg | ORAL_TABLET | Freq: Once | ORAL | Status: AC
  Administered 2021-09-08: 50 mg via ORAL
  Filled 2021-09-08: qty 1

## 2021-09-08 MED ORDER — CIPROFLOXACIN HCL 0.3 % OP SOLN
2.0000 [drp] | OPHTHALMIC | Status: DC
  Administered 2021-09-08 – 2021-09-09 (×5): 2 [drp] via OPHTHALMIC
  Filled 2021-09-08: qty 2.5

## 2021-09-08 MED ORDER — TRAMADOL HCL 50 MG PO TABS
50.0000 mg | ORAL_TABLET | Freq: Four times a day (QID) | ORAL | Status: DC | PRN
  Administered 2021-09-08 – 2021-09-11 (×5): 50 mg via ORAL
  Filled 2021-09-08 (×5): qty 1

## 2021-09-08 MED ORDER — MORPHINE SULFATE (PF) 2 MG/ML IV SOLN
1.0000 mg | INTRAVENOUS | Status: DC | PRN
  Administered 2021-09-08 – 2021-09-10 (×6): 1 mg via INTRAVENOUS
  Filled 2021-09-08 (×6): qty 1

## 2021-09-08 MED ORDER — IPRATROPIUM-ALBUTEROL 0.5-2.5 (3) MG/3ML IN SOLN
3.0000 mL | Freq: Four times a day (QID) | RESPIRATORY_TRACT | Status: DC
  Administered 2021-09-08: 3 mL via RESPIRATORY_TRACT
  Filled 2021-09-08: qty 3

## 2021-09-08 MED ORDER — MENTHOL 3 MG MT LOZG
1.0000 | LOZENGE | OROMUCOSAL | Status: DC | PRN
  Filled 2021-09-08 (×2): qty 9

## 2021-09-08 MED ORDER — MORPHINE SULFATE (PF) 2 MG/ML IV SOLN
1.0000 mg | Freq: Once | INTRAVENOUS | Status: AC
  Administered 2021-09-08: 1 mg via INTRAVENOUS
  Filled 2021-09-08: qty 1

## 2021-09-08 MED ORDER — IPRATROPIUM-ALBUTEROL 0.5-2.5 (3) MG/3ML IN SOLN
3.0000 mL | Freq: Three times a day (TID) | RESPIRATORY_TRACT | Status: DC
  Administered 2021-09-08 – 2021-09-09 (×3): 3 mL via RESPIRATORY_TRACT
  Filled 2021-09-08 (×3): qty 3

## 2021-09-08 NOTE — Progress Notes (Signed)
PT Cancellation Note  Patient Details Name: Michele Parrish MRN: [id number] DOB: [birth year]   Cancelled Treatment:    Reason Eval/Treat Not Completed: Other (comment) Checked in am and pt sleeping.  Returned around 1430 and pt declined due to just got pain meds and wanting to rest.  Will f/u as able. Anise Salvo, PT Acute Rehab Services Pager [phone] [facility] [phone]   Rayetta Humphrey 09/08/2021, 2:53 PM

## 2021-09-08 NOTE — Evaluation (Signed)
Physical Therapy Evaluation Patient Details Name: Michele Parrish MRN: [id number] DOB: [birth year] Today's Date: 09/08/2021  History of Present Illness  Pt is 55 yo female admitted on 09/06/21 with shortness of breath/cough and found to have COPD exacerbation and PNE.  Pt additionally with conjunctivitis in L eye. Pt is on 5 L O2 during day and 6 L at night at home; requiring as high as 15 L this admission.  Pt with hx of COPD, pulmonary arterial HTN, CHF, tobacco use disorder.   Clinical Impression  Pt admitted with above diagnosis. At baseline, pt lives alone and was independent with basic ADLS and short distance community ambulation.  She is on 5-6 L O2 at home and reports easily fatigued with ADLs and IADLs.  Today, pt reluctantly agreeing to PT eval (she is currently more focused on L eye pain and resting).  She was able to ambulate 25' with min A, mild unsteadiness, and requiring 9 L O2 with sats 90%.  Pt is below her baseline and will benefit from acute PT services.  She asked about HH aide and financial assistance - sent message to Child psychotherapist. Pt currently with functional limitations due to the deficits listed below (see PT Problem List). Pt will benefit from skilled PT to increase their independence and safety with mobility to allow discharge to the venue listed below.          Recommendations for follow up therapy are one component of a multi-disciplinary discharge planning process, led by the attending physician.  Recommendations may be updated based on patient status, additional functional criteria and insurance authorization.  Follow Up Recommendations Home health PT (also requesting HH aide)    Assistance Recommended at Discharge PRN  Functional Status Assessment Patient has had a recent decline in their functional status and demonstrates the ability to make significant improvements in function in a reasonable and predictable amount of time.  Equipment Recommendations  Other  (comment) (possible rollator)    Recommendations for Other Services       Precautions / Restrictions Precautions Precautions: Fall      Mobility  Bed Mobility Overal bed mobility: Needs Assistance Bed Mobility: Supine to Sit;Sit to Supine     Supine to sit: Supervision Sit to supine: Supervision        Transfers Overall transfer level: Needs assistance Equipment used: None Transfers: Sit to/from Stand Sit to Stand: Min guard           General transfer comment: mild unsteadiness    Ambulation/Gait Ambulation/Gait assistance: Min assist Gait Distance (Feet): 25 Feet Assistive device: None Gait Pattern/deviations: Step-through pattern;Decreased stride length Gait velocity: decreased     General Gait Details: Pt not wanting to walk in hallway, made several small laps in room with min A to steady and guide as she is having difficulty seeing due to L eye pain/keeping eye squinted.  Stairs            Wheelchair Mobility    Modified Rankin (Stroke Patients Only)       Balance Overall balance assessment: Needs assistance Sitting-balance support: No upper extremity supported Sitting balance-Leahy Scale: Good     Standing balance support: No upper extremity supported Standing balance-Leahy Scale: Fair Standing balance comment: ambulated without AD but unsteady at times                             Pertinent Vitals/Pain Pain Assessment: Faces Faces Pain Scale: Hurts  even more Pain Location: L eye Pain Descriptors / Indicators: Discomfort Pain Intervention(s): Limited activity within patient's tolerance;Monitored during session;Premedicated before session    Home Living Family/patient expects to be discharged to:: Private residence Living Arrangements: Alone Available Help at Discharge: Friend(s);Available PRN/intermittently Type of Home: House Home Access: Stairs to enter Entrance Stairs-Rails: Right Entrance Stairs-Number of Steps:  4   Home Layout: One level   Additional Comments: Reports no DME at home    Prior Function Prior Level of Function : Independent/Modified Independent             Mobility Comments: Pt reports she ambulated without AD, could do short community distances ADLs Comments: Pt does not drive; reports independent with ADLs but does sponge baths and easily fatigues; reports does IADLs but again easily fatigues and asking about HH aide     Hand Dominance   Dominant Hand: Right    Extremity/Trunk Assessment   Upper Extremity Assessment Upper Extremity Assessment: Overall WFL for tasks assessed    Lower Extremity Assessment Lower Extremity Assessment: Overall WFL for tasks assessed    Cervical / Trunk Assessment Cervical / Trunk Assessment: Normal  Communication   Communication: No difficulties  Cognition Arousal/Alertness: Awake/alert Behavior During Therapy: Flat affect Overall Cognitive Status: Within Functional Limits for tasks assessed                                 General Comments: Pt not eager about PT eval but agreeable, "let's get it over with," focused on L eye pain        General Comments General comments (skin integrity, edema, etc.): Pt on 9 L O2 with sats 96% rest and 90% activity.                                    She asked about HH aide due to easily fatigued wtih IADLs and ADLs , and also asked about finiacial assistance due to difficulty paying bills - passed information on to [facility] team.    Exercises     Assessment/Plan    PT Assessment Patient needs continued PT services  PT Problem List Decreased strength;Decreased mobility;Decreased safety awareness;Decreased activity tolerance;Cardiopulmonary status limiting activity;Decreased balance;Decreased knowledge of use of DME       PT Treatment Interventions DME instruction;Therapeutic activities;Gait training;Therapeutic exercise;Patient/family education;Stair training;Balance  training;Functional mobility training    PT Goals (Current goals can be found in the Care Plan section)  Acute Rehab PT Goals Patient Stated Goal: L eye pain decrease PT Goal Formulation: With patient Time For Goal Achievement: 09/22/21 Potential to Achieve Goals: Good    Frequency Min 3X/week   Barriers to discharge        Co-evaluation               AM-PAC PT "6 Clicks" Mobility  Outcome Measure Help needed turning from your back to your side while in a flat bed without using bedrails?: A Little Help needed moving from lying on your back to sitting on the side of a flat bed without using bedrails?: A Little Help needed moving to and from a bed to a chair (including a wheelchair)?: A Little Help needed standing up from a chair using your arms (e.g., wheelchair or bedside chair)?: A Little Help needed to walk in hospital room?: A Little Help needed climbing 3-5 steps with  a railing? : A Little 6 Click Score: 18    End of Session Equipment Utilized During Treatment: Oxygen Activity Tolerance: Patient tolerated treatment well Patient left: in bed;with call bell/phone within reach;with bed alarm set Nurse Communication: Mobility status PT Visit Diagnosis: Other abnormalities of gait and mobility (R26.89)    Time: 9935-7017 PT Time Calculation (min) (ACUTE ONLY): 14 min   Charges:   PT Evaluation $PT Eval Low Complexity: 1 Low          Dacey Milberger, PT Acute Rehab Services Pager [phone] [name] [phone]   Rayetta Humphrey 09/08/2021, 4:32 PM

## 2021-09-08 NOTE — Progress Notes (Signed)
PROGRESS NOTE    Michele Parrish  ZTI:[id number] DOB: Mar 13, 1966 DOA: 09/06/2021 PCP: Elsie Stain, MD    Brief Narrative:  Michele Parrish is a 55 year old female with past medical history significant for chronic hypoxic respiratory failure on 6 L nasal cannula at baseline, COPD, pulmonary arterial hypertension, chronic diastolic congestive heart failure, tobacco use disorder who presents to [facility] ED on 11/12 for progressive shortness of breath and cough.  Onset 5 days prior, took OTC cough meds with some mild improvement.  She continues with shortness of breath, attempted to use her home inhaler/nebulizers but believes her nebulizer machine is broken and ineffective.  Her breathing continued to decline and EMS was activated.  On EMS arrival she was found to be hypoxic with SPO2's reported in the 80s and was brought to the ED for further evaluation.  In the ED, temperature 98.1 F, HR 122, RR 30, BP 150/118, SPO2 87% on 15 L nasal cannula.  VBG with pH 7.35, PCO2 47.8, PO2 52.4.  Sodium 135, potassium 2.7, chloride 98, CO2 24, BUN 8, creatinine 1.07, glucose 145.  LFTs within normal limits.  WBC 8.0, hemoglobin 13.8, platelets 159.  High sensitive troponin 13> 12.  COVID-19 PCR negative.  Influenza A/B PCR negative.  MRSA PCR negative.  Chest x-ray with COPD with bilateral atelectasis versus bronchopneumonia.  Patient was started on empiric antibiotics, placed on BiPAP.  Hospitalist service consulted for further evaluation and management of acute on chronic hypoxic respite failure secondary to pneumonia versus COPD exacerbation.   Assessment & Plan:   Active Problems:   COPD with acute exacerbation (HCC)   Tobacco abuse   Moderate to severe pulmonary hypertension (HCC)   Chronic diastolic heart failure (HCC)   Acute on chronic respiratory failure with hypoxia (HCC)   Bronchopneumonia   Acute on chronic hypoxic respiratory failure, POA Patient presenting with progressive shortness of  breath associated with cough.  Not improved with home inhalers, OTC cough medicine and nebulizer treatments.  Patient was found to be hypoxic on her home oxygen as reported by EMS with SPO2 in the 80s.  Placed on 15 L nasal cannula and finally on BiPAP.  Chest x-ray consistent with likely bronchopneumonia coupled with acute COPD exacerbation. --Continue treatment as below  Community-acquired pneumonia Patient presenting with increasing shortness of breath, productive cough.  Chest x-ray consistent with bronchopneumonia.  Patient is afebrile without leukocytosis. --Azithromycin 500 mg daily x 5 days --Ceftriaxone 2 g IV q24h x 5 days --Mucinex 600 mg q12h -- Continue submental oxygen, maintain SPO2 greater than 88%, on 6L [place] this am w/ SpO2 94% at rest.  COPD exacerbation --Brovana nebs BID --Pulmicort neb BID --Prednisone 40 mg p.o. daily to complete 5-day steroid course --DuoNebs as needed shortness of breath/wheezing --Continue antibiotics as above --DME order for new nebulizer machine for home use, TOC for assistance --Continue supplemental oxygen, maintain SPO2 greater than 88% (on 5L [place] during day and 6L at night --BiPAP as needed  Conjunctivitis, OS Patient states blurred vision, pain to left thigh.  Patient with significant injection to left eye conjunctive a with thin drainage. --Cipro eyedrops every 4 hours while awake x5 days --Discussed with patient extensively not to rub eyes as can transfer infection to her other eye. --Supportive care  Hypokalemia: Resolved Potassium 2.7 admission, repleted.  Potassium 4.3 this morning.  Urinary retention Patient with bladder distention and urinary retention noted on bladder scan. --DC Foley catheter today, voiding trial. --Bladder scan and next void  or if no void in 6 hours  Chronic diastolic congestive heart failure, compensated On furosemide 40 mg p.o. daily at baseline.  Appears euvolemic. --Hold furosemide for now --Strict I's  and O's and daily weights  CKD stage II Baseline creatinine appears to be 1.0-1.3.,  Stable --Cr 1.07>1.59>1.22>0.77 --Avoid nephrotoxins, renallly dose all medications  Tobacco use disorder Counseled on need for complete cessation  Weakness/deconditioning: --PT evaluation pending   DVT prophylaxis: enoxaparin (LOVENOX) injection 40 mg Start: 09/06/21 1800   Code Status: DNR Family Communication: No family present at bedside this morning  Disposition Plan:  Level of care: Med-Surg Status is: Inpatient  Remains inpatient appropriate because: Continues on IV antibiotics, will need TOC evaluation for new nebulizer machine, scheduled neb treatments     Consultants:  None  Procedures:  BiPAP  Antimicrobials:  Azithromycin 11/12>> Ceftriaxone 11/12>>    Subjective: Patient seen examined at bedside, resting comfortably.  Complaining of left thigh pain.  States pain medication not working.  Breathing much improved and has no complaints in regards to her respiratory status. No other questions or concerns at this time.  Denies headache, no dizziness, no chest pain, no palpitations, no fever/chills/night sweats, no nausea/vomiting/diarrhea, abdominal pain, no  paresthesias.  Called to bedside earlier this morning for eye discomfort and blurred vision, otherwise no acute events reported overnight by nursing staff.  Objective: Vitals:   09/08/21 0027 09/08/21 0530 09/08/21 0735 09/08/21 0943  BP: 110/68 123/72  (!) 141/92  Pulse: 98 (!) 58  93  Resp: 20 18  14   Temp: 98.2 F (36.8 C) 98 F (36.7 C)  98.3 F (36.8 C)  TempSrc: Oral Axillary  Oral  SpO2: (!) 88% 94% 92% 99%  Weight:      Height:        Intake/Output Summary (Last 24 hours) at 09/08/2021 1032 Last data filed at 09/08/2021 0900 Gross per 24 hour  Intake 1413.32 ml  Output 2300 ml  Net -886.68 ml   Filed Weights   09/06/21 0859  Weight: 61.2 kg    Examination:  General exam: Anxious/irritated,  chronically ill appearance, appears older than stated age 84: Left eye with subconjunctival erythema with scleral redness, thin discharge, no matting of eyelids, right eye normal in appearance, EOMI, PERRL Respiratory system: Slightly decreased breath sounds bilateral bases, late expiratory wheezing bilaterally, no accessory muscle use, on 6 L nasal cannula with SPO2 94% at rest Cardiovascular system: S1 & S2 heard, RRR. No JVD, murmurs, rubs, gallops or clicks. No pedal edema. Gastrointestinal system: Abdomen is nondistended, soft and nontender. No organomegaly or masses felt. Normal bowel sounds heard.  Foley catheter noted with clear yellow urine in collection bag Central nervous system: Alert and oriented. No focal neurological deficits. Extremities: Symmetric 5 x 5 power. Skin: No rashes, lesions or ulcers Psychiatry: Judgement and insight appear poor.  Anxious/irritated mood      Data Reviewed: I have personally reviewed following labs and imaging studies  CBC: Recent Labs  Lab 09/06/21 0900 09/06/21 1341 09/07/21 0309  WBC 8.0 8.3 9.3  NEUTROABS 4.9  --   --   HGB 13.8 13.7 12.0  HCT 44.4 43.4 39.1  MCV 84.7 85.4 85.9  PLT 159 165 628   Basic Metabolic Panel: Recent Labs  Lab 09/06/21 0900 09/06/21 1341 09/07/21 0309 09/08/21 0421  NA 135  --  135 138  K 2.7*  --  4.1 4.3  CL 98  --  103 102  CO2 24  --  26 29  GLUCOSE 145*  --  132* 118*  BUN 8  --  24* 20  CREATININE 1.07* 1.59* 1.22* 0.77  CALCIUM 8.9  --  8.5* 8.7*  MG  --  2.6*  --  2.3   GFR: Estimated Creatinine Clearance: 70 mL/min (by C-G formula based on SCr of 0.77 mg/dL). Liver Function Tests: Recent Labs  Lab 09/06/21 0900 09/07/21 0309  AST 32 31  ALT 14 11  ALKPHOS 79 65  BILITOT 0.8 0.3  PROT 9.3* 8.2*  ALBUMIN 3.9 3.3*   No results for input(s): LIPASE, AMYLASE in the last 168 hours. No results for input(s): AMMONIA in the last 168 hours. Coagulation Profile: No results for  input(s): INR, PROTIME in the last 168 hours. Cardiac Enzymes: No results for input(s): CKTOTAL, CKMB, CKMBINDEX, TROPONINI in the last 168 hours. BNP (last 3 results) No results for input(s): PROBNP in the last 8760 hours. HbA1C: No results for input(s): HGBA1C in the last 72 hours. CBG: No results for input(s): GLUCAP in the last 168 hours. Lipid Profile: No results for input(s): CHOL, HDL, LDLCALC, TRIG, CHOLHDL, LDLDIRECT in the last 72 hours. Thyroid Function Tests: No results for input(s): TSH, T4TOTAL, FREET4, T3FREE, THYROIDAB in the last 72 hours. Anemia Panel: No results for input(s): VITAMINB12, FOLATE, FERRITIN, TIBC, IRON, RETICCTPCT in the last 72 hours. Sepsis Labs: No results for input(s): PROCALCITON, LATICACIDVEN in the last 168 hours.  Recent Results (from the past 240 hour(s))  Resp Panel by RT-PCR (Flu A&B, Covid) Nasopharyngeal Swab     Status: None   Collection Time: 09/06/21  9:00 AM   Specimen: Nasopharyngeal Swab; Nasopharyngeal(NP) swabs in vial transport medium  Result Value Ref Range Status   SARS Coronavirus 2 by RT PCR NEGATIVE NEGATIVE Final    Comment: (NOTE) SARS-CoV-2 target nucleic acids are NOT DETECTED.  The SARS-CoV-2 RNA is generally detectable in upper respiratory specimens during the acute phase of infection. The lowest concentration of SARS-CoV-2 viral copies this assay can detect is 138 copies/mL. A negative result does not preclude SARS-Cov-2 infection and should not be used as the sole basis for treatment or other patient management decisions. A negative result may occur with  improper specimen collection/handling, submission of specimen other than nasopharyngeal swab, presence of viral mutation(s) within the areas targeted by this assay, and inadequate number of viral copies(<138 copies/mL). A negative result must be combined with clinical observations, patient history, and epidemiological information. The expected result is  Negative.  Fact Sheet for Patients:  EntrepreneurPulse.com.au  Fact Sheet for Healthcare Providers:  IncredibleEmployment.be  This test is no t yet approved or cleared by the [place] FDA and  has been authorized for detection and/or diagnosis of SARS-CoV-2 by FDA under an Emergency Use Authorization (EUA). This EUA will remain  in effect (meaning this test can be used) for the duration of the COVID-19 declaration under Section 564(b)(1) of the Act, 21 U.S.C.section 360bbb-3(b)(1), unless the authorization is terminated  or revoked sooner.       Influenza A by PCR NEGATIVE NEGATIVE Final   Influenza B by PCR NEGATIVE NEGATIVE Final    Comment: (NOTE) The Xpert Xpress SARS-CoV-2/FLU/RSV plus assay is intended as an aid in the diagnosis of influenza from Nasopharyngeal swab specimens and should not be used as a sole basis for treatment. Nasal washings and aspirates are unacceptable for Xpert Xpress SARS-CoV-2/FLU/RSV testing.  Fact Sheet for Patients: EntrepreneurPulse.com.au  Fact Sheet for Healthcare Providers: IncredibleEmployment.be  This test  is not yet approved or cleared by the [place] and has been authorized for detection and/or diagnosis of SARS-CoV-2 by FDA under an Emergency Use Authorization (EUA). This EUA will remain in effect (meaning this test can be used) for the duration of the COVID-19 declaration under Section 564(b)(1) of the Act, 21 U.S.C. section 360bbb-3(b)(1), unless the authorization is terminated or revoked.  Performed at [facility], [place] [address], [place], [place] [address]   MRSA Next Gen by PCR, Nasal     Status: None   Collection Time: 09/06/21  1:06 PM   Specimen: Nasal Mucosa; Nasal Swab  Result Value Ref Range Status   MRSA by PCR Next Gen NOT DETECTED NOT DETECTED Final    Comment: (NOTE) The GeneXpert MRSA Assay (FDA approved  for NASAL specimens only), is one component of a comprehensive MRSA colonization surveillance program. It is not intended to diagnose MRSA infection nor to guide or monitor treatment for MRSA infections. Test performance is not FDA approved in patients less than 47 years old. Performed at [facility], [place] [address] [place], [place] [address]          Radiology Studies: No results found.      Scheduled Meds:  arformoterol  15 mcg Nebulization BID   azithromycin  500 mg Oral Daily   budesonide (PULMICORT) nebulizer solution  0.5 mg Nebulization BID   chlorhexidine gluconate (MEDLINE KIT)  15 mL Mouth Rinse BID   Chlorhexidine Gluconate Cloth  6 each Topical Daily   ciprofloxacin  2 drop Left Eye Q4H while awake   enoxaparin (LOVENOX) injection  40 mg Subcutaneous Q24H   guaiFENesin  600 mg Oral BID   influenza vac split quadrivalent PF  0.5 mL Intramuscular Tomorrow-1000   ipratropium-albuterol  3 mL Nebulization TID   mouth rinse  15 mL Mouth Rinse q12n4p   predniSONE  40 mg Oral Q breakfast   Continuous Infusions:  cefTRIAXone (ROCEPHIN)  IV 2 g (09/07/21 1752)     LOS: 2 days    Time spent: 41 minutes spent on chart review, discussion with nursing staff, consultants, updating family and interview/physical exam; more than 50% of that time was spent in counseling and/or coordination of care.    Vallie Fayette J [place] DO [facility] Available via Epic secure chat 7am-7pm After these hours, please refer to coverage provider listed on [email or web address] 09/08/2021, 10:32 AM

## 2021-09-09 DIAGNOSIS — H1033 Unspecified acute conjunctivitis, bilateral: Secondary | ICD-10-CM

## 2021-09-09 MED ORDER — ALBUTEROL SULFATE (2.5 MG/3ML) 0.083% IN NEBU: 2.5000 mg | INHALATION_SOLUTION | RESPIRATORY_TRACT | Status: DC | PRN

## 2021-09-09 MED ORDER — CIPROFLOXACIN HCL 0.3 % OP SOLN
2.0000 [drp] | OPHTHALMIC | Status: DC
  Administered 2021-09-09 – 2021-09-10 (×6): 2 [drp] via OPHTHALMIC
  Filled 2021-09-09: qty 2.5

## 2021-09-09 MED ORDER — IPRATROPIUM-ALBUTEROL 0.5-2.5 (3) MG/3ML IN SOLN
3.0000 mL | Freq: Two times a day (BID) | RESPIRATORY_TRACT | Status: DC
  Administered 2021-09-09 – 2021-09-11 (×4): 3 mL via RESPIRATORY_TRACT
  Filled 2021-09-09 (×4): qty 3

## 2021-09-09 MED ORDER — FUROSEMIDE 40 MG PO TABS
40.0000 mg | ORAL_TABLET | Freq: Every day | ORAL | Status: DC
  Administered 2021-09-09 – 2021-09-11 (×3): 40 mg via ORAL
  Filled 2021-09-09 (×3): qty 1

## 2021-09-09 NOTE — TOC Initial Note (Signed)
Transition of Care [facility]) - Initial/Assessment Note    Patient Details  Name: Michele Parrish MRN: [id number] Date of Birth: 10-19-1966  Transition of Care [facility]) CM/SW Contact:    Ross Ludwig, LCSW Phone Number: 09/09/2021, 3:29 PM  Clinical Narrative:                  CSW attempted to see patient, however she was still sleeping.  CSW completed assessment by reviewing patient's chart.  Patient currently receiving services for oxygen from [facility].  Patient had a nebulizer, however patient reported that it is not working anymore and needs a new one.  CSW contacted Zach at [facility], and was able to get a new one for her.  CSW to continue to follow patient's progress throughout discharge planning.  Expected Discharge Plan: Home/Self Care Barriers to Discharge: Continued Medical Work up   Patient Goals and CMS Choice Patient states their goals for this hospitalization and ongoing recovery are:: To return back home CMS Medicare.gov Compare Post Acute Care list provided to:: Patient Choice offered to / list presented to : Patient  Expected Discharge Plan and Services Expected Discharge Plan: Home/Self Care     Post Acute Care Choice: Durable Medical Equipment Living arrangements for the past 2 months: Single Family Home                 DME Arranged: Nebulizer machine DME Agency: [employer] Date DME Agency Contacted: 09/09/21 Time DME Agency Contacted: 1527 Representative spoke with at DME Agency: Thedore Mins            Prior Living Arrangements/Services Living arrangements for the past 2 months: [place] with:: Self Patient language and need for interpreter reviewed:: Yes Do you feel safe going back to the place where you live?: Yes      Need for Family Participation in Patient Care: No (Comment) Care giver support system in place?: Yes (comment) Current home services: DME Criminal Activity/Legal Involvement Pertinent to Current  Situation/Hospitalization: No - Comment as needed  Activities of Daily Living Home Assistive Devices/Equipment: None ADL Screening (condition at time of admission) Patient's cognitive ability adequate to safely complete daily activities?: Yes Is the patient deaf or have difficulty hearing?: No Does the patient have difficulty seeing, even when wearing glasses/contacts?: No Does the patient have difficulty concentrating, remembering, or making decisions?: No Patient able to express need for assistance with ADLs?: Yes Does the patient have difficulty dressing or bathing?: No Independently performs ADLs?: Yes (appropriate for developmental age) Does the patient have difficulty walking or climbing stairs?: No Weakness of Legs: None Weakness of Arms/Hands: None  Permission Sought/Granted Permission sought to share information with : Family Supports Permission granted to share information with : Yes, Release of Information Signed  Share Information with NAME: Pinnix,Jamie Daughter   [phone]  Permission granted to share info w AGENCY: [employer] D[facility]        Emotional Assessment Appearance:: Appears stated age   Affect (typically observed): Accepting, Appropriate, Calm Orientation: : Oriented to Self, Oriented to Place, Oriented to  Time, Oriented to Situation Alcohol / Substance Use: Tobacco Use Psych Involvement: No (comment)  Admission diagnosis:  COPD exacerbation ([place]) [J44.1] Acute respiratory failure with hypoxia (HCC) [J96.01] Acute on chronic respiratory failure with hypoxia ([place]) [J96.21] Community acquired pneumonia, unspecified laterality [J18.9] Patient Active Problem List   Diagnosis Date Noted   Conjunctivitis 09/08/2021   Bronchopneumonia 09/07/2021   Acute on chronic respiratory failure with hypoxia ([place]) 09/06/2021  Absence of bladder continence 07/22/2020   Numbness in feet 06/20/2020   Chronic respiratory failure with hypoxia (HCC) 03/26/2020   CKD  (chronic kidney disease), 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   Tobacco abuse 02/22/2020   Moderate to severe pulmonary hypertension (HCC) 02/22/2020   Pulmonary nodules 02/22/2020   Chronic diastolic heart failure (HCC) 02/22/2020   COPD with chronic bronchitis (HCC) 02/21/2020   COPD with acute exacerbation (HCC) 10/23/2010   PCP:  Storm Frisk, MD Pharmacy:   [facility] [address] [place] [address] Phone: [phone] Fax: [phone]  [name] [address] [place] [address] Phone: [phone] Fax: [phone]     Social Determinants of Health (SDOH) Interventions    Readmission Risk Interventions No flowsheet data found.

## 2021-09-09 NOTE — Progress Notes (Signed)
PROGRESS NOTE    Michele Parrish  DXA:[id number] DOB: 1966-02-07 DOA: 09/06/2021 PCP: Elsie Stain, MD    Brief Narrative:  Michele Parrish is a 55 year old female with past medical history significant for chronic hypoxic respiratory failure on 6 L nasal cannula at baseline, COPD, pulmonary arterial hypertension, chronic diastolic congestive heart failure, tobacco use disorder who presents to [facility] ED on 11/12 for progressive shortness of breath and cough.  Onset 5 days prior, took OTC cough meds with some mild improvement.  She continues with shortness of breath, attempted to use her home inhaler/nebulizers but believes her nebulizer machine is broken and ineffective.  Her breathing continued to decline and EMS was activated.  On EMS arrival she was found to be hypoxic with SPO2's reported in the 80s and was brought to the ED for further evaluation.  In the ED, temperature 98.1 F, HR 122, RR 30, BP 150/118, SPO2 87% on 15 L nasal cannula.  VBG with pH 7.35, PCO2 47.8, PO2 52.4.  Sodium 135, potassium 2.7, chloride 98, CO2 24, BUN 8, creatinine 1.07, glucose 145.  LFTs within normal limits.  WBC 8.0, hemoglobin 13.8, platelets 159.  High sensitive troponin 13> 12.  COVID-19 PCR negative.  Influenza A/B PCR negative.  MRSA PCR negative.  Chest x-ray with COPD with bilateral atelectasis versus bronchopneumonia.  Patient was started on empiric antibiotics, placed on BiPAP.  Hospitalist service consulted for further evaluation and management of acute on chronic hypoxic respite failure secondary to pneumonia versus COPD exacerbation.   Assessment & Plan:   Active Problems:   COPD with acute exacerbation (HCC)   Tobacco abuse   Moderate to severe pulmonary hypertension (HCC)   Chronic diastolic heart failure (HCC)   Acute on chronic respiratory failure with hypoxia (HCC)   Bronchopneumonia   Conjunctivitis   Acute on chronic hypoxic respiratory failure, POA Patient presenting with  progressive shortness of breath associated with cough.  Not improved with home inhalers, OTC cough medicine and nebulizer treatments.  Patient was found to be hypoxic on her home oxygen as reported by EMS with SPO2 in the 80s.  Placed on 15 L nasal cannula and finally on BiPAP.  Chest x-ray consistent with likely bronchopneumonia coupled with acute COPD exacerbation. --Continue treatment as below  Community-acquired pneumonia Patient presenting with increasing shortness of breath, productive cough.  Chest x-ray consistent with bronchopneumonia.  Patient is afebrile without leukocytosis. --Azithromycin 500 mg daily x 5 days --Ceftriaxone 2 g IV q24h x 5 days --Mucinex 600 mg q12h --Continue submental oxygen, maintain SPO2 > 88%, on 6L [place] this am w/ SpO2 94% at rest.  COPD exacerbation --Brovana nebs BID --Pulmicort neb BID --Prednisone 40 mg p.o. daily to complete 5-day steroid course --DuoNebs as needed shortness of breath/wheezing --Continue antibiotics as above --DME order for new nebulizer machine for home use, TOC for assistance --Continue supplemental oxygen, maintain SPO2 > 88% (on 5L [place] during day and 6L at night --BiPAP as needed  Conjunctivitis, OD Patient states blurred vision, pain to left thigh.  Patient with significant injection to left eye conjunctive a with thin drainage. --Cipro eyedrops q4h while awake x 7 days --Discussed with patient extensively not to rub eyes as can transfer infection. --Supportive care  Hypokalemia: Resolved Potassium 2.7 admission, repleted.  -- BMP in a.m.  Urinary retention Patient with bladder distention and urinary retention noted on bladder scan. --DC Foley catheter 11/14 --Bladder scan prn  Chronic diastolic congestive heart failure, compensated On furosemide  40 mg p.o. daily at baseline.  Appears euvolemic. --Resume home furosemide today --Strict I's and O's and daily weights  CKD stage II Baseline creatinine appears to be  1.0-1.3.,  Stable --Cr 1.07>1.59>1.22>0.77 --Avoid nephrotoxins, renallly dose all medications  Tobacco use disorder Counseled on need for complete cessation  Weakness/deconditioning: --PT recommending home health with aide on discharge   DVT prophylaxis: enoxaparin (LOVENOX) injection 40 mg Start: 09/06/21 1800   Code Status: DNR Family Communication: No family present at bedside this morning  Disposition Plan:  Level of care: Med-Surg Status is: Inpatient  Remains inpatient appropriate because: Continues on IV antibiotics, TOC evaluation for new nebulizer machine, scheduled neb treatments     Consultants:  None  Procedures:  BiPAP  Antimicrobials:  Azithromycin 11/12>> Ceftriaxone 11/12>> Cipro eyedrops 11/14>>   Subjective: Patient seen examined at bedside, resting comfortably.  Left eye pain improved, continues with mild blurry vision.  Now reporting some redness and discomfort to right eye.  Will change eyedrops to bilaterally as patient likely transferred infection from left to right eye.  No other complaints or concerns at this time.  Denies headache, no dizziness, no chest pain, no palpitations, no fever/chills/night sweats, no nausea/vomiting/diarrhea, no abdominal pain.  No acute events overnight per nursing staff.  Objective: Vitals:   09/08/21 2101 09/08/21 2200 09/09/21 0528 09/09/21 0714  BP:  127/82 130/79   Pulse:  75 76   Resp:  18 18   Temp:  98.2 F (36.8 C) 98.7 F (37.1 C)   TempSrc:   Oral   SpO2: 95% 94% 98% 95%  Weight:      Height:        Intake/Output Summary (Last 24 hours) at 09/09/2021 1305 Last data filed at 09/09/2021 1000 Gross per 24 hour  Intake 1000 ml  Output --  Net 1000 ml   Filed Weights   09/06/21 0859  Weight: 61.2 kg    Examination:  General exam: No acute distress, chronically ill appearance, appears older than stated age 60: Left eye with subconjunctival erythema with scleral redness, thin discharge, no  matting of eyelids, right eye now with some mild erythema, EOMI, PERRL Respiratory system: Slightly decreased breath sounds bilateral bases, late expiratory wheezing bilaterally, no accessory muscle use, on 6 L nasal cannula with SPO2 94% at rest Cardiovascular system: S1 & S2 heard, RRR. No JVD, murmurs, rubs, gallops or clicks. No pedal edema. Gastrointestinal system: Abdomen is nondistended, soft and nontender. No organomegaly or masses felt. Normal bowel sounds heard.   Central nervous system: Alert and oriented. No focal neurological deficits. Extremities: Symmetric 5 x 5 power. Skin: No rashes, lesions or ulcers Psychiatry: Judgement and insight appear poor.  Normal mood/affect    Data Reviewed: I have personally reviewed following labs and imaging studies  CBC: Recent Labs  Lab 09/06/21 0900 09/06/21 1341 09/07/21 0309  WBC 8.0 8.3 9.3  NEUTROABS 4.9  --   --   HGB 13.8 13.7 12.0  HCT 44.4 43.4 39.1  MCV 84.7 85.4 85.9  PLT 159 165 161   Basic Metabolic Panel: Recent Labs  Lab 09/06/21 0900 09/06/21 1341 09/07/21 0309 09/08/21 0421  NA 135  --  135 138  K 2.7*  --  4.1 4.3  CL 98  --  103 102  CO2 24  --  26 29  GLUCOSE 145*  --  132* 118*  BUN 8  --  24* 20  CREATININE 1.07* 1.59* 1.22* 0.77  CALCIUM 8.9  --  8.5* 8.7*  MG  --  2.6*  --  2.3   GFR: Estimated Creatinine Clearance: 70 mL/min (by C-G formula based on SCr of 0.77 mg/dL). Liver Function Tests: Recent Labs  Lab 09/06/21 0900 09/07/21 0309  AST 32 31  ALT 14 11  ALKPHOS 79 65  BILITOT 0.8 0.3  PROT 9.3* 8.2*  ALBUMIN 3.9 3.3*   No results for input(s): LIPASE, AMYLASE in the last 168 hours. No results for input(s): AMMONIA in the last 168 hours. Coagulation Profile: No results for input(s): INR, PROTIME in the last 168 hours. Cardiac Enzymes: No results for input(s): CKTOTAL, CKMB, CKMBINDEX, TROPONINI in the last 168 hours. BNP (last 3 results) No results for input(s): PROBNP in the  last 8760 hours. HbA1C: No results for input(s): HGBA1C in the last 72 hours. CBG: No results for input(s): GLUCAP in the last 168 hours. Lipid Profile: No results for input(s): CHOL, HDL, LDLCALC, TRIG, CHOLHDL, LDLDIRECT in the last 72 hours. Thyroid Function Tests: No results for input(s): TSH, T4TOTAL, FREET4, T3FREE, THYROIDAB in the last 72 hours. Anemia Panel: No results for input(s): VITAMINB12, FOLATE, FERRITIN, TIBC, IRON, RETICCTPCT in the last 72 hours. Sepsis Labs: No results for input(s): PROCALCITON, LATICACIDVEN in the last 168 hours.  Recent Results (from the past 240 hour(s))  Resp Panel by RT-PCR (Flu A&B, Covid) Nasopharyngeal Swab     Status: None   Collection Time: 09/06/21  9:00 AM   Specimen: Nasopharyngeal Swab; Nasopharyngeal(NP) swabs in vial transport medium  Result Value Ref Range Status   SARS Coronavirus 2 by RT PCR NEGATIVE NEGATIVE Final    Comment: (NOTE) SARS-CoV-2 target nucleic acids are NOT DETECTED.  The SARS-CoV-2 RNA is generally detectable in upper respiratory specimens during the acute phase of infection. The lowest concentration of SARS-CoV-2 viral copies this assay can detect is 138 copies/mL. A negative result does not preclude SARS-Cov-2 infection and should not be used as the sole basis for treatment or other patient management decisions. A negative result may occur with  improper specimen collection/handling, submission of specimen other than nasopharyngeal swab, presence of viral mutation(s) within the areas targeted by this assay, and inadequate number of viral copies(<138 copies/mL). A negative result must be combined with clinical observations, patient history, and epidemiological information. The expected result is Negative.  Fact Sheet for Patients:  EntrepreneurPulse.com.au  Fact Sheet for Healthcare Providers:  IncredibleEmployment.be  This test is no t yet approved or cleared by the  [place] FDA and  has been authorized for detection and/or diagnosis of SARS-CoV-2 by FDA under an Emergency Use Authorization (EUA). This EUA will remain  in effect (meaning this test can be used) for the duration of the COVID-19 declaration under Section 564(b)(1) of the Act, 21 U.S.C.section 360bbb-3(b)(1), unless the authorization is terminated  or revoked sooner.       Influenza A by PCR NEGATIVE NEGATIVE Final   Influenza B by PCR NEGATIVE NEGATIVE Final    Comment: (NOTE) The Xpert Xpress SARS-CoV-2/FLU/RSV plus assay is intended as an aid in the diagnosis of influenza from Nasopharyngeal swab specimens and should not be used as a sole basis for treatment. Nasal washings and aspirates are unacceptable for Xpert Xpress SARS-CoV-2/FLU/RSV testing.  Fact Sheet for Patients: EntrepreneurPulse.com.au  Fact Sheet for Healthcare Providers: IncredibleEmployment.be  This test is not yet approved or cleared by the [place] FDA and has been authorized for detection and/or diagnosis of SARS-CoV-2 by FDA under an Emergency Use Authorization (EUA). This  EUA will remain in effect (meaning this test can be used) for the duration of the COVID-19 declaration under Section 564(b)(1) of the Act, 21 U.S.C. section 360bbb-3(b)(1), unless the authorization is terminated or revoked.  Performed at [facility], [place] [address] [place], [place] [address]   MRSA Next Gen by PCR, Nasal     Status: None   Collection Time: 09/06/21  1:06 PM   Specimen: Nasal Mucosa; Nasal Swab  Result Value Ref Range Status   MRSA by PCR Next Gen NOT DETECTED NOT DETECTED Final    Comment: (NOTE) The GeneXpert MRSA Assay (FDA approved for NASAL specimens only), is one component of a comprehensive MRSA colonization surveillance program. It is not intended to diagnose MRSA infection nor to guide or monitor treatment for MRSA infections. Test  performance is not FDA approved in patients less than 88 years old. Performed at [facility], [place] [address] [place],  [address]          Radiology Studies: No results found.      Scheduled Meds:  arformoterol  15 mcg Nebulization BID   azithromycin  500 mg Oral Daily   budesonide (PULMICORT) nebulizer solution  0.5 mg Nebulization BID   chlorhexidine gluconate (MEDLINE KIT)  15 mL Mouth Rinse BID   Chlorhexidine Gluconate Cloth  6 each Topical Daily   ciprofloxacin  2 drop Both Eyes Q4H while awake   enoxaparin (LOVENOX) injection  40 mg Subcutaneous Q24H   guaiFENesin  600 mg Oral BID   influenza vac split quadrivalent PF  0.5 mL Intramuscular Tomorrow-1000   ipratropium-albuterol  3 mL Nebulization BID   mouth rinse  15 mL Mouth Rinse q12n4p   predniSONE  40 mg Oral Q breakfast   Continuous Infusions:  cefTRIAXone (ROCEPHIN)  IV 2 g (09/08/21 1416)     LOS: 3 days    Time spent: 39 minutes spent on chart review, discussion with nursing staff, consultants, updating family and interview/physical exam; more than 50% of that time was spent in counseling and/or coordination of care.    Najee Cowens J [place] DO [facility] Available via Epic secure chat 7am-7pm After these hours, please refer to coverage provider listed on [email or web address] 09/09/2021, 1:05 PM

## 2021-09-09 NOTE — TOC Progression Note (Addendum)
Transition of Care [facility]) - Progression Note    Patient Details  Name: SARANNE CRISLIP MRN: [id number] Date of Birth: 1966-08-12  [facility] CM/SW Contact  Darleene Cleaver, [place] Phone Number: 09/09/2021, 12:05 PM  Clinical Narrative:     CSW reviewed patient's chart and was informed that her nebulizer is not working anymore.  Per chart review, it looks like she received one through [employer] earlier in the year.  CSW left a message for Zach from [employer] to check on patient.  12:45pm  CSW spoke to [place] at [employer], patient currently only receives oxygen from them.  CSW requested a nebulizer for patient, Ian Malkin will work on arranging one for patient.    2:00pm  CSW was informed that [employer] was able to deliver patient a new nebulizer.    Barriers to Discharge: Continued Medical Work up  Expected Discharge Plan and Services    Patient plan to go home with new nebulizer once medically ready for discharge.                                             Social Determinants of Health (SDOH) Interventions    Readmission Risk Interventions No flowsheet data found.

## 2021-09-10 DIAGNOSIS — S0502XA Injury of conjunctiva and corneal abrasion without foreign body, left eye, initial encounter: Secondary | ICD-10-CM

## 2021-09-10 DIAGNOSIS — R9431 Abnormal electrocardiogram [ECG] [EKG]: Secondary | ICD-10-CM

## 2021-09-10 DIAGNOSIS — F32A Depression, unspecified: Secondary | ICD-10-CM

## 2021-09-10 LAB — CBC
HCT: 43.8 % (ref 36.0–46.0)
Hemoglobin: 13.6 g/dL (ref 12.0–15.0)
MCH: 26.6 pg (ref 26.0–34.0)
MCHC: 31.1 g/dL (ref 30.0–36.0)
MCV: 85.7 fL (ref 80.0–100.0)
Platelets: 255 10*3/uL (ref 150–400)
RBC: 5.11 MIL/uL (ref 3.87–5.11)
RDW: 14.5 % (ref 11.5–15.5)
WBC: 9.5 10*3/uL (ref 4.0–10.5)
nRBC: 0 % (ref 0.0–0.2)

## 2021-09-10 LAB — BASIC METABOLIC PANEL
Anion gap: 8 (ref 5–15)
BUN: 17 mg/dL (ref 6–20)
CO2: 34 mmol/L — ABNORMAL HIGH (ref 22–32)
Calcium: 9 mg/dL (ref 8.9–10.3)
Chloride: 98 mmol/L (ref 98–111)
Creatinine, Ser: 0.84 mg/dL (ref 0.44–1.00)
GFR, Estimated: >60 mL/min (ref >60)
Glucose, Bld: 118 mg/dL — ABNORMAL HIGH (ref 70–99)
Potassium: 3.7 mmol/L (ref 3.5–5.1)
Sodium: 140 mmol/L (ref 135–145)

## 2021-09-10 MED ORDER — SERTRALINE HCL 50 MG PO TABS
50.0000 mg | ORAL_TABLET | Freq: Every day | ORAL | Status: DC
  Administered 2021-09-11: 09:00:00 50 mg via ORAL
  Filled 2021-09-10: qty 1

## 2021-09-10 MED ORDER — FLUORESCEIN SODIUM 1 MG OP STRP
1.0000 | ORAL_STRIP | Freq: Once | OPHTHALMIC | Status: AC
  Administered 2021-09-10: 1 via OPHTHALMIC
  Filled 2021-09-10: qty 1

## 2021-09-10 MED ORDER — ACETAMINOPHEN 500 MG PO TABS
1000.0000 mg | ORAL_TABLET | Freq: Three times a day (TID) | ORAL | Status: DC
  Administered 2021-09-10 – 2021-09-11 (×2): 1000 mg via ORAL
  Filled 2021-09-10 (×2): qty 2

## 2021-09-10 MED ORDER — ACETAMINOPHEN 500 MG PO TABS: 1000.0000 mg | ORAL_TABLET | Freq: Three times a day (TID) | ORAL | Status: DC

## 2021-09-10 MED ORDER — OXYCODONE HCL 5 MG PO TABS: 5.0000 mg | ORAL_TABLET | ORAL | Status: DC | PRN

## 2021-09-10 MED ORDER — KETOROLAC TROMETHAMINE 15 MG/ML IJ SOLN
15.0000 mg | Freq: Four times a day (QID) | INTRAMUSCULAR | Status: DC | PRN
  Administered 2021-09-10: 15 mg via INTRAVENOUS
  Filled 2021-09-10: qty 1

## 2021-09-10 MED ORDER — OXYCODONE HCL 5 MG PO TABS: 5.0000 mg | ORAL_TABLET | Freq: Four times a day (QID) | ORAL | Status: DC | PRN

## 2021-09-10 MED ORDER — POLYVINYL ALCOHOL 1.4 % OP SOLN
1.0000 [drp] | OPHTHALMIC | Status: DC | PRN
  Filled 2021-09-10: qty 15

## 2021-09-10 MED ORDER — ACETAMINOPHEN 325 MG PO TABS: 650.0000 mg | ORAL_TABLET | Freq: Four times a day (QID) | ORAL | Status: DC | PRN

## 2021-09-10 MED ORDER — BACITRACIN-POLYMYXIN B 500-10000 UNIT/GM OP OINT
TOPICAL_OINTMENT | Freq: Three times a day (TID) | OPHTHALMIC | Status: DC
  Administered 2021-09-10 – 2021-09-11 (×2): 1 via OPHTHALMIC
  Filled 2021-09-10: qty 3.5

## 2021-09-10 MED ORDER — PHENOL 1.4 % MT LIQD
1.0000 | OROMUCOSAL | Status: DC | PRN
  Administered 2021-09-10: 1 via OROMUCOSAL

## 2021-09-10 NOTE — Assessment & Plan Note (Addendum)
Expressed passive SI, but denied plan or intention.  She again denies suicidal intent to me today or Breyer Tejera plan.  We've started her on zoloft.  Psychiatry was consulted, but sounds like she didn't speak with them (she's been concerned about her ride).  Recommending outpatient follow up.  Note pending at this time.  Repeat QTc improved

## 2021-09-10 NOTE — Assessment & Plan Note (Addendum)
Occurred after night on bipap Injected conjunctiva on R She notes blurry vision, but doesn't have her glasses, difficult to do visual acuity.  Visual fields intact.  Treat as abrasion given history.  Abx ointment, prn artificial tears.  Oral pain meds.  (discussed informally with ophtho). Outpatient ophtho f/u.  D/c with abx ointment, Michele Parrish few doses of pain meds.

## 2021-09-10 NOTE — Assessment & Plan Note (Addendum)
Repeat EKG improved ?

## 2021-09-10 NOTE — Progress Notes (Signed)
PROGRESS NOTE    [name]  CHE:[id number] DOB: [birth year] DOA: 09/06/2021 PCP: Elsie Stain, MD   Chief Complaint  Patient presents with   Shortness of Breath    Brief Narrative:  Michele Parrish is Willaim Parrish 55 year old female with past medical history significant for chronic hypoxic respiratory failure on 6 L nasal cannula at baseline, COPD, pulmonary arterial hypertension, chronic diastolic congestive heart failure, tobacco use disorder who presents to [facility] ED on 11/12 for progressive shortness of breath and cough.  Onset 5 days prior, took OTC cough meds with some mild improvement.  She continues with shortness of breath, attempted to use her home inhaler/nebulizers but believes her nebulizer machine is broken and ineffective.  Her breathing continued to decline and EMS was activated.  On EMS arrival she was found to be hypoxic with SPO2's reported in the 80s and was brought to the ED for further evaluation.   In the ED, temperature 98.1 F, HR 122, RR 30, BP 150/118, SPO2 87% on 15 L nasal cannula.  VBG with pH 7.35, PCO2 47.8, PO2 52.4.  Sodium 135, potassium 2.7, chloride 98, CO2 24, BUN 8, creatinine 1.07, glucose 145.  LFTs within normal limits.  WBC 8.0, hemoglobin 13.8, platelets 159.  High sensitive troponin 13> 12.  COVID-19 PCR negative.  Influenza Mizani Dilday/B PCR negative.  MRSA PCR negative.  Chest x-ray with COPD with bilateral atelectasis versus bronchopneumonia.  Patient was started on empiric antibiotics, placed on BiPAP.  Hospitalist service consulted for further evaluation and management of acute on chronic hypoxic respite failure secondary to pneumonia versus COPD exacerbation.   Assessment & Plan:   Active Problems:   COPD with acute exacerbation (HCC)   Tobacco abuse   Moderate to severe pulmonary hypertension (HCC)   Chronic diastolic heart failure (HCC)   Acute on chronic respiratory failure with hypoxia (HCC)   Bronchopneumonia   Conjunctivitis  * COPD with acute  exacerbation (HCC) Uses 5 L at rest and 6 L at night Improved from resp standpoint, close to baseline Continue prn, scheduled nebs.  Pulmicort, brovana, duonebs.  Prn albuterol.  Completed 5 days steroids.  Ceftriaxone/azithro 11/12-present.   Acute on chronic respiratory failure with hypoxia (HCC) Treating as COPD and pneumonia as below  Corneal abrasion, left Occurred after night on bipap Injected conjunctiva on R She notes blurry vision, but doesn't have her glasses, difficult to do visual acuity.  Visual fields intact.  Treat as abrasion given history.  Abx ointment, prn artificial tears.  Oral pain meds.  (discussed informally with ophtho). Outpatient ophtho f/u   Bronchopneumonia Continue abx (ceftriaxone/azithromycin 12/2-present) CXR with bilateral atelectasis or bronchopneumonia Needs follow up imaging outpatient in [place] few weeks Negative covid, influenza Urine strep pending collection  Depression Passive SI - "doesn't think she would ever" do it, but says "what's the point".  Her father died about 4 ears ago, then she got sick after this.  She notes, "I have no one else".   Will start zoloft.  Discussed risks/benefits, expectations.  Given what seems to be severe depression with passive suicidal ideation (and limited patient transportation), will consult psychiatry while she's inpatient.  CKD (chronic kidney disease), stage II Renal function at baseline  Chronic diastolic heart failure (HCC) Euvolemic, follow on lasix  Tobacco abuse Encourage cessation     DVT prophylaxis: lovenox Code Status: DNR Family Communication: none Disposition:   Status is: Inpatient  Remains inpatient appropriate because: continued pain management, pending psych  Consultants:  psychiatry  Procedures:  none  Antimicrobials: Anti-infectives (From admission, onward)    Start     Dose/Rate Route Frequency Ordered Stop   09/08/21 1000  azithromycin (ZITHROMAX) tablet  500 mg        500 mg Oral Daily 09/07/21 1714 09/10/21 0916   09/06/21 1600  azithromycin (ZITHROMAX) 500 mg in sodium chloride 0.9 % 250 mL IVPB  Status:  Discontinued        500 mg 250 mL/hr over 60 Minutes Intravenous Every 24 hours 09/06/21 1328 09/07/21 1714   09/06/21 1400  cefTRIAXone (ROCEPHIN) 2 g in sodium chloride 0.9 % 100 mL IVPB        2 g 200 mL/hr over 30 Minutes Intravenous Every 24 hours 09/06/21 1328 09/10/21 1521   09/06/21 1115  cefTRIAXone (ROCEPHIN) 1 g in sodium chloride 0.9 % 100 mL IVPB  Status:  Discontinued        1 g 200 mL/hr over 30 Minutes Intravenous  Once 09/06/21 1102 09/06/21 1331   09/06/21 1115  azithromycin (ZITHROMAX) 500 mg in sodium chloride 0.9 % 250 mL IVPB  Status:  Discontinued        500 mg 250 mL/hr over 60 Minutes Intravenous  Once 09/06/21 1102 09/06/21 1331          Subjective: C/o L eye discomfort  Notes depressed mood, passive SI  Objective: Vitals:   09/09/21 2057 09/10/21 0557 09/10/21 0841 09/10/21 1304  BP: 108/65 (!) 121/94  108/63  Pulse: (!) 108 95  (!) 108  Resp: _0 Temp: 98.2 F (36.8 C) 98.6 F (37 C)  98.1 F (36.7 C)  TempSrc: Oral Oral    SpO2: (!) 89% 94% 95% 100%  Weight:      Height:        Intake/Output Summary (Last 24 hours) at 09/10/2021 1605 Last data filed at 09/10/2021 1259 Gross per 24 hour  Intake 1900 ml  Output 2300 ml  Net -400 ml   Filed Weights   09/06/21 0859  Weight: 61.2 kg    Examination:  General exam: Appears calm and comfortable  Respiratory system: unlabored Cardiovascular system: RRR Gastrointestinal system: Abdomen is nondistended, soft and nontender.  Central nervous system: Alert and oriented. No focal neurological deficits. Extremities: no LEE Skin: No rashes, lesions or ulcers Psychiatry: Judgement and insight appear normal. Mood & affect appropriate.     Data Reviewed: I have personally reviewed following labs and imaging studies  CBC: Recent  Labs  Lab 09/06/21 0900 09/06/21 1341 09/07/21 0309 09/10/21 0423  WBC 8.0 8.3 9.3 9.5  NEUTROABS 4.9  --   --   --   HGB 13.8 13.7 12.0 13.6  HCT 44.4 43.4 39.1 43.8  MCV 84.7 85.4 85.9 85.7  PLT 159 165 158 354    Basic Metabolic Panel: Recent Labs  Lab 09/06/21 0900 09/06/21 1341 09/07/21 0309 09/08/21 0421 09/10/21 0423  NA 135  --  135 138 140  K 2.7*  --  4.1 4.3 3.7  CL 98  --  103 102 98  CO2 24  --  26 29 34*  GLUCOSE 145*  --  132* 118* 118*  BUN 8  --  24* 20 17  CREATININE 1.07* 1.59* 1.22* 0.77 0.84  CALCIUM 8.9  --  8.5* 8.7* 9.0  MG  --  2.6*  --  2.3  --     GFR: Estimated Creatinine Clearance: 66.6 mL/min (by C-G formula  based on SCr of 0.84 mg/dL).  Liver Function Tests: Recent Labs  Lab 09/06/21 0900 09/07/21 0309  AST 32 31  ALT 14 11  ALKPHOS 79 65  BILITOT 0.8 0.3  PROT 9.3* 8.2*  ALBUMIN 3.9 3.3*    CBG: No results for input(s): GLUCAP in the last 168 hours.   Recent Results (from the past 240 hour(s))  Resp Panel by RT-PCR (Flu Tatiyana Foucher&B, Covid) Nasopharyngeal Swab     Status: None   Collection Time: 09/06/21  9:00 AM   Specimen: Nasopharyngeal Swab; Nasopharyngeal(NP) swabs in vial transport medium  Result Value Ref Range Status   SARS Coronavirus 2 by RT PCR NEGATIVE NEGATIVE Final    Comment: (NOTE) SARS-CoV-2 target nucleic acids are NOT DETECTED.  The SARS-CoV-2 RNA is generally detectable in upper respiratory specimens during the acute phase of infection. The lowest concentration of SARS-CoV-2 viral copies this assay can detect is 138 copies/mL. Jarriel Papillion negative result does not preclude SARS-Cov-2 infection and should not be used as the sole basis for treatment or other patient management decisions. Avynn Klassen negative result may occur with  improper specimen collection/handling, submission of specimen other than nasopharyngeal swab, presence of viral mutation(s) within the areas targeted by this assay, and inadequate number of  viral copies(<138 copies/mL). Jhovanny Guinta negative result must be combined with clinical observations, patient history, and epidemiological information. The expected result is Negative.  Fact Sheet for Patients:  EntrepreneurPulse.com.au  Fact Sheet for Healthcare Providers:  IncredibleEmployment.be  This test is no t yet approved or cleared by the [place] FDA and  has been authorized for detection and/or diagnosis of SARS-CoV-2 by FDA under an Emergency Use Authorization (EUA). This EUA will remain  in effect (meaning this test can be used) for the duration of the COVID-19 declaration under Section 564(b)(1) of the Act, 21 U.S.C.section 360bbb-3(b)(1), unless the authorization is terminated  or revoked sooner.       Influenza Idamae Coccia by PCR NEGATIVE NEGATIVE Final   Influenza B by PCR NEGATIVE NEGATIVE Final    Comment: (NOTE) The Xpert Xpress SARS-CoV-2/FLU/RSV plus assay is intended as an aid in the diagnosis of influenza from Nasopharyngeal swab specimens and should not be used as Kimball Manske sole basis for treatment. Nasal washings and aspirates are unacceptable for Xpert Xpress SARS-CoV-2/FLU/RSV testing.  Fact Sheet for Patients: EntrepreneurPulse.com.au  Fact Sheet for Healthcare Providers: IncredibleEmployment.be  This test is not yet approved or cleared by the [place] FDA and has been authorized for detection and/or diagnosis of SARS-CoV-2 by FDA under an Emergency Use Authorization (EUA). This EUA will remain in effect (meaning this test can be used) for the duration of the COVID-19 declaration under Section 564(b)(1) of the Act, 21 U.S.C. section 360bbb-3(b)(1), unless the authorization is terminated or revoked.  Performed at [facility], [place] [address] [place], [place] [address]   MRSA Next Gen by PCR, Nasal     Status: None   Collection Time: 09/06/21  1:06 PM   Specimen: Nasal  Mucosa; Nasal Swab  Result Value Ref Range Status   MRSA by PCR Next Gen NOT DETECTED NOT DETECTED Final    Comment: (NOTE) The GeneXpert MRSA Assay (FDA approved for NASAL specimens only), is one component of Atticus Wedin comprehensive MRSA colonization surveillance program. It is not intended to diagnose MRSA infection nor to guide or monitor treatment for MRSA infections. Test performance is not FDA approved in patients less than 15 years old. Performed at [facility], [address] [place], [place] [address]          Radiology Studies: No results found.      Scheduled Meds:  acetaminophen  1,000 mg Oral Q8H   arformoterol  15 mcg Nebulization BID   bacitracin-polymyxin b   Left Eye TID   budesonide (PULMICORT) nebulizer solution  0.5 mg Nebulization BID   chlorhexidine gluconate (MEDLINE KIT)  15 mL Mouth Rinse BID   Chlorhexidine Gluconate Cloth  6 each Topical Daily   enoxaparin (LOVENOX) injection  40 mg Subcutaneous Q24H   furosemide  40 mg Oral Daily   guaiFENesin  600 mg Oral BID   ipratropium-albuterol  3 mL Nebulization BID   mouth rinse  15 mL Mouth Rinse q12n4p   Continuous Infusions:   LOS: 4 days    Time spent: over 30 min    Fayrene Helper, MD [facility]   To contact the attending provider between 7A-7P or the covering provider during after hours 7P-7A, please log into the web site [email or web address] and access using universal [place] password for that web site. If you do not have the password, please call the hospital operator.  09/10/2021, 4:05 PM

## 2021-09-10 NOTE — Assessment & Plan Note (Signed)
Renal function at baseline 

## 2021-09-10 NOTE — TOC Progression Note (Signed)
Transition of Care [facility]) - Progression Note   Patient Details  Name: Michele Parrish MRN: [id number] Date of Birth: 1966/03/30  [facility] CM/SW Contact  Ewing Schlein, LCSW Phone Number: 09/10/2021, 2:13 PM  Clinical Narrative: CSW made [facility] referral to [facility] with [facility]. [facility] able to accept for PT, aide, and RN. CSW spoke with patient to notify her of acceptance for [facility] services. TOC to follow.  Expected Discharge Plan: Home w Home Health Services Barriers to Discharge: Continued Medical Work up  Expected Discharge Plan and Services Expected Discharge Plan: Home w Home Health Services Post Acute Care Choice: Durable Medical Equipment, Home Health Living arrangements for the past 2 months: Single Family Home           DME Arranged: Nebulizer machine DME Agency: [facility] Date DME Agency Contacted: 09/09/21 Time DME Agency Contacted: 1527 Representative spoke with at DME Agency: Ian Malkin HH Arranged: RN, PT, Nurse's Aide HH Agency: [facility] Date HH Agency Contacted: 09/10/21 Time HH Agency Contacted: 1313 Representative spoke with at [facility] Agency: Pearson Grippe  Readmission Risk Interventions No flowsheet data found.

## 2021-09-10 NOTE — Assessment & Plan Note (Addendum)
Continue abx (ceftriaxone/azithromycin 12/2-present) CXR with bilateral atelectasis or bronchopneumonia Needs follow up imaging outpatient in [place] few weeks Negative covid, influenza Urine strep pending collection

## 2021-09-10 NOTE — Assessment & Plan Note (Signed)
Encourage cessation. °

## 2021-09-10 NOTE — Progress Notes (Signed)
Physical Therapy Treatment Patient Details Name: Michele Parrish MRN: [id number] DOB: [birth year] Today's Date: 09/10/2021   History of Present Illness Pt is 55 yo female admitted on 09/06/21 with shortness of breath/cough and found to have COPD exacerbation and PNE.  Pt additionally with conjunctivitis in L eye. Pt is on 5 L O2 during day and 6 L at night at home; requiring as high as 15 L this admission.  Pt with hx of COPD, pulmonary arterial HTN, CHF, tobacco use disorder.    PT Comments    General Comments: AxO x 3 very willing to try but stated "I can't do much".  Pt lives home alone and uses 5 lts oxygen 24/7.  She has a neighbor friend who helps with groccerys, MD appointments, etc. General transfer comment: self able with increased time and use of hands to steady self.  Pt has been getting in/OOB to [facility] unassisted. General Gait Details: stayed in room per pt request.  "I can't go that far".  Pt stated she has limited breathing and at most can walk around her home.  Pt did NOT need any AD.  Balance is good.  Does present with 4/4 dysnpnea and requires freq rest breaks.  Remained on 5 lts with sats avh 88% and HR 92. She stated this was an improvement from when she was admitted.  "I couldn't even take two steps". Pt plans to D/C to home  Recommendations for follow up therapy are one component of a multi-disciplinary discharge planning process, led by the attending physician.  Recommendations may be updated based on patient status, additional functional criteria and insurance authorization.  Follow Up Recommendations  Home health PT     Assistance Recommended at Discharge PRN  Equipment Recommendations  None recommended by PT    Recommendations for Other Services       Precautions / Restrictions Precautions Precautions: Fall Precaution Comments: Home oxygen 5 lts     Mobility  Bed Mobility Overal bed mobility: Needs Assistance;Modified Independent             General  bed mobility comments: self able with increased time    Transfers Overall transfer level: Modified independent Equipment used: None               General transfer comment: self able with increased time and use of hands to steady self.  Pt has been getting in/[facility]B to [facility] unassisted.    Ambulation/Gait Ambulation/Gait assistance: Supervision Gait Distance (Feet): 22 Feet Assistive device: None Gait Pattern/deviations: Step-through pattern;Decreased stride length Gait velocity: decreased     General Gait Details: stayed in room per pt request.  "I can't go that far".  Pt stated she has limited breathing and at most can walk around her home.  Pt did NOT need any AD.  Balance is good.  Does present with 4/4 dysnpnea and requires freq rest breaks.  Remained on 5 lts with sats avh 88% and HR 92. She stated this was an improvement from when she was admitted.  "I couldn't even take two steps".   Stairs             Wheelchair Mobility    Modified Rankin (Stroke Patients Only)       Balance  Cognition Arousal/Alertness: Awake/alert Behavior During Therapy: WFL for tasks assessed/performed Overall Cognitive Status: Within Functional Limits for tasks assessed                                 General Comments: AxO x 3 very willing to try but stated "I can't do much".  Pt lives home alone and uses 5 lts oxygen 24/7.  She has a neighbor friend who helps with groccerys, MD appointments, etc.        Exercises      General Comments        Pertinent Vitals/Pain Pain Assessment: No/denies pain    Home Living                          Prior Function            PT Goals (current goals can now be found in the care plan section) Progress towards PT goals: Progressing toward goals    Frequency    Min 3X/week      PT Plan Current plan remains appropriate    Co-evaluation               AM-PAC PT "6 Clicks" Mobility   Outcome Measure  Help needed turning from your back to your side while in a flat bed without using bedrails?: None Help needed moving from lying on your back to sitting on the side of a flat bed without using bedrails?: None Help needed moving to and from a bed to a chair (including a wheelchair)?: None Help needed standing up from a chair using your arms (e.g., wheelchair or bedside chair)?: None Help needed to walk in hospital room?: A Little Help needed climbing 3-5 steps with a railing? : A Little 6 Click Score: 22    End of Session Equipment Utilized During Treatment: Gait belt;Oxygen   Patient left: in bed;with call bell/phone within reach;with bed alarm set Nurse Communication: Mobility status PT Visit Diagnosis: Other abnormalities of gait and mobility (R26.89)     Time: 1322-1340 PT Time Calculation (min) (ACUTE ONLY): 18 min  Charges:  $Gait Training: 8-22 mins                     Felecia Shelling  PTA Acute  Rehabilitation Services Pager      [phone] Office      [phone]

## 2021-09-10 NOTE — Assessment & Plan Note (Addendum)
Uses 5 L at rest and 6 L at night Improved from resp standpoint, close to baseline Continue prn, scheduled nebs.  Pulmicort, brovana, duonebs.  Prn albuterol.  Completed 5 days steroids.  Ceftriaxone/azithro 11/12-16.

## 2021-09-10 NOTE — Assessment & Plan Note (Addendum)
Treating as COPD and pneumonia as below She's back to her baseline from resp status  She needs repeat CXR in several weeks

## 2021-09-10 NOTE — Assessment & Plan Note (Signed)
Euvolemic, follow on lasix

## 2021-09-11 ENCOUNTER — Other Ambulatory Visit (HOSPITAL_COMMUNITY): Payer: Self-pay

## 2021-09-11 LAB — CBC WITH DIFFERENTIAL/PLATELET
Abs Immature Granulocytes: 0.03 10*3/uL (ref 0.00–0.07)
Basophils Absolute: 0.1 10*3/uL (ref 0.0–0.1)
Basophils Relative: 1 %
Eosinophils Absolute: 0.1 10*3/uL (ref 0.0–0.5)
Eosinophils Relative: 1 %
HCT: 44.8 % (ref 36.0–46.0)
Hemoglobin: 13.6 g/dL (ref 12.0–15.0)
Immature Granulocytes: 0 %
Lymphocytes Relative: 41 %
Lymphs Abs: 4.2 10*3/uL — ABNORMAL HIGH (ref 0.7–4.0)
MCH: 26.3 pg (ref 26.0–34.0)
MCHC: 30.4 g/dL (ref 30.0–36.0)
MCV: 86.7 fL (ref 80.0–100.0)
Monocytes Absolute: 0.7 10*3/uL (ref 0.1–1.0)
Monocytes Relative: 7 %
Neutro Abs: 5.2 10*3/uL (ref 1.7–7.7)
Neutrophils Relative %: 50 %
Platelets: 292 10*3/uL (ref 150–400)
RBC: 5.17 MIL/uL — ABNORMAL HIGH (ref 3.87–5.11)
RDW: 14.4 % (ref 11.5–15.5)
WBC: 10.3 10*3/uL (ref 4.0–10.5)
nRBC: 0 % (ref 0.0–0.2)

## 2021-09-11 LAB — COMPREHENSIVE METABOLIC PANEL
ALT: 15 U/L (ref 0–44)
AST: 19 U/L (ref 15–41)
Albumin: 3.4 g/dL — ABNORMAL LOW (ref 3.5–5.0)
Alkaline Phosphatase: 59 U/L (ref 38–126)
Anion gap: 7 (ref 5–15)
BUN: 25 mg/dL — ABNORMAL HIGH (ref 6–20)
CO2: 33 mmol/L — ABNORMAL HIGH (ref 22–32)
Calcium: 8.7 mg/dL — ABNORMAL LOW (ref 8.9–10.3)
Chloride: 101 mmol/L (ref 98–111)
Creatinine, Ser: 0.96 mg/dL (ref 0.44–1.00)
GFR, Estimated: >60 mL/min (ref >60)
Glucose, Bld: 100 mg/dL — ABNORMAL HIGH (ref 70–99)
Potassium: 3.4 mmol/L — ABNORMAL LOW (ref 3.5–5.1)
Sodium: 141 mmol/L (ref 135–145)
Total Bilirubin: 0.5 mg/dL (ref 0.3–1.2)
Total Protein: 7.9 g/dL (ref 6.5–8.1)

## 2021-09-11 LAB — PHOSPHORUS: Phosphorus: 4.1 mg/dL (ref 2.5–4.6)

## 2021-09-11 LAB — MAGNESIUM: Magnesium: 2.4 mg/dL (ref 1.7–2.4)

## 2021-09-11 MED ORDER — ALBUTEROL SULFATE HFA 108 (90 BASE) MCG/ACT IN AERS
2.0000 | INHALATION_SPRAY | RESPIRATORY_TRACT | 10 refills | Status: DC | PRN
  Filled 2021-09-11: qty 18, 16d supply, fill #0

## 2021-09-11 MED ORDER — GABAPENTIN 300 MG PO CAPS
300.0000 mg | ORAL_CAPSULE | Freq: Every day | ORAL | 0 refills | Status: DC
  Filled 2021-09-11: qty 30, 30d supply, fill #0

## 2021-09-11 MED ORDER — BACITRACIN-POLYMYXIN B 500-10000 UNIT/GM OP OINT
TOPICAL_OINTMENT | Freq: Three times a day (TID) | OPHTHALMIC | 0 refills | Status: AC
  Filled 2021-09-11: qty 3.5, 7d supply, fill #0

## 2021-09-11 MED ORDER — OXYCODONE HCL 5 MG PO TABS
5.0000 mg | ORAL_TABLET | Freq: Three times a day (TID) | ORAL | 0 refills | Status: DC | PRN
  Filled 2021-09-11: qty 4, 2d supply, fill #0

## 2021-09-11 MED ORDER — PROAIR RESPICLICK 108 (90 BASE) MCG/ACT IN AEPB
INHALATION_SPRAY | RESPIRATORY_TRACT | 0 refills | Status: DC
  Filled 2021-09-11: qty 1, 16d supply, fill #0

## 2021-09-11 MED ORDER — POLYVINYL ALCOHOL 1.4 % OP SOLN
1.0000 [drp] | OPHTHALMIC | 0 refills | Status: DC | PRN
  Filled 2021-09-11: qty 15, fill #0

## 2021-09-11 MED ORDER — SERTRALINE HCL 50 MG PO TABS
50.0000 mg | ORAL_TABLET | Freq: Every day | ORAL | 0 refills | Status: DC
  Filled 2021-09-11: qty 30, 30d supply, fill #0

## 2021-09-11 MED ORDER — SERTRALINE HCL 50 MG PO TABS: 50.0000 mg | ORAL_TABLET | Freq: Every day | ORAL | 0 refills | Status: DC

## 2021-09-11 MED ORDER — POTASSIUM CHLORIDE CRYS ER 20 MEQ PO TBCR
40.0000 meq | EXTENDED_RELEASE_TABLET | Freq: Once | ORAL | Status: AC
  Administered 2021-09-11: 09:00:00 40 meq via ORAL
  Filled 2021-09-11: qty 2

## 2021-09-11 MED ORDER — IPRATROPIUM-ALBUTEROL 0.5-2.5 (3) MG/3ML IN SOLN
3.0000 mL | Freq: Four times a day (QID) | RESPIRATORY_TRACT | 0 refills | Status: DC | PRN
  Filled 2021-09-11: qty 180, 15d supply, fill #0

## 2021-09-11 MED ORDER — NICOTINE POLACRILEX 4 MG MT LOZG
4.0000 mg | LOZENGE | OROMUCOSAL | 0 refills | Status: DC | PRN
Start: 2021-09-11 — End: 2023-04-30
  Filled 2021-09-11 – 2022-01-23 (×2): qty 72, 30d supply, fill #0

## 2021-09-11 NOTE — Progress Notes (Signed)
Patient refuses to wait on oxygen tank from [facility], she stated she lives 10 mins away and her ride confirms that he will not be able to wait for the oxygen tank to arrive. Patient was advised against leaving but decided to leave regardless of efforts to get her discharged safely home.

## 2021-09-11 NOTE — Discharge Summary (Signed)
Physician Discharge Summary  Michele Parrish [id number] DOB: [birth year] DOA: 09/06/2021  PCP: Elsie Stain, MD  Admit date: 09/06/2021 Discharge date: 09/11/2021  Time spent: 40 minutes  Recommendations for Outpatient Follow-up:  Follow outpatient CBC/CMP Follow with PCP outpatient Consider ophtho outpatient follow up if continued L eye pain Needs outpatient psychiatry follow up  Needs repeat CXR in 3-4 weeks  Discharge Diagnoses:  Principal Problem:   Acute on chronic respiratory failure with hypoxia ([place]) Active Problems:   Moderate to severe pulmonary hypertension (HCC)   Conjunctivitis   COPD with acute exacerbation (HCC)   Bronchopneumonia   Corneal abrasion, left   Depression   Tobacco abuse   Chronic diastolic heart failure (HCC)   CKD (chronic kidney disease), stage II   Prolonged QT interval   Discharge Condition: stable  Diet recommendation: heart healthy  Filed Weights   09/06/21 0859  Weight: 61.2 kg    History of present illness:  Michele Parrish is Michele Parrish 55 year old female with past medical history significant for chronic hypoxic respiratory failure on 6 L nasal cannula at baseline, COPD, pulmonary arterial hypertension, chronic diastolic congestive heart failure, tobacco use disorder who presents to [facility] ED on 11/12 for progressive shortness of breath and cough.  Onset 5 days prior, took OTC cough meds with some mild improvement.  She continues with shortness of breath, attempted to use her home inhaler/nebulizers but believes her nebulizer machine is broken and ineffective.  Her breathing continued to decline and EMS was activated.  On EMS arrival she was found to be hypoxic with SPO2's reported in the 80s and was brought to the ED for further evaluation.  She's improved with steroids, nebs, and antibiotics.  She's back at her baseline from Michele Parrish respiratory standpoint.  Her hospitalization was complicated by suspected corneal abrasion and she was started  on topical antibiotic ointment.  She expressed symptoms of depression and passive SI, but denied any true intent or plan.  She was started on zoloft and recommended to follow outpatient.  Hospital Course:  * Acute on chronic respiratory failure with hypoxia (HCC) Treating as COPD and pneumonia as below She's back to her baseline from resp status  She needs repeat CXR in several weeks  Corneal abrasion, left Occurred after night on bipap Injected conjunctiva on R She notes blurry vision, but doesn't have her glasses, difficult to do visual acuity.  Visual fields intact.  Treat as abrasion given history.  Abx ointment, prn artificial tears.  Oral pain meds.  (discussed informally with ophtho). Outpatient ophtho f/u.  D/c with abx ointment, Michele Parrish few doses of pain meds.  Bronchopneumonia Continue abx (ceftriaxone/azithromycin 12/2-present) CXR with bilateral atelectasis or bronchopneumonia Needs follow up imaging outpatient in Michele Parrish few weeks Negative covid, influenza Urine strep pending collection  COPD with acute exacerbation (Michele Parrish) Uses 5 L at rest and 6 L at night Improved from resp standpoint, close to baseline Continue prn, scheduled nebs.  Pulmicort, brovana, duonebs.  Prn albuterol.  Completed 5 days steroids.  Ceftriaxone/azithro 11/12-16.   Depression Expressed passive SI, but denied plan or intention.  She again denies suicidal intent to me today or Michele Parrish plan.  We've started her on zoloft.  Psychiatry was consulted, but sounds like she didn't speak with them (she's been concerned about her ride).  Recommending outpatient follow up.  Note pending at this time.  Repeat QTc improved   CKD (chronic kidney disease), stage II Renal function at baseline  Chronic diastolic heart failure ([place])  Euvolemic, follow on lasix  Tobacco abuse Encourage cessation  Prolonged QT interval Repeat EKG improved  Procedures: none  Consultations: psychiatry  Discharge Exam: Vitals:    09/11/21 0758 09/11/21 1234  BP:  (!) 131/91  Pulse:  97  Resp:  18  Temp:  97.8 F (36.6 C)  SpO2: (!) 89% 92%   Breathing feels at baseline Denies suicidal intent Eager to discharge by 2 as that's when her ride will be here  General: No acute distress. Injected conjunctiva to L eye Cardiovascular: RRR Lungs: unlabored, rare wheeze. Abdomen: Soft, nontender, nondistended Neurological: Alert and oriented 3. Moves all extremities 4 . Cranial nerves II through XII grossly intact. Skin: Warm and dry. No rashes or lesions. Extremities: No clubbing or cyanosis. No edema.  Discharge Instructions   Discharge Instructions     Call MD for:  difficulty breathing, headache or visual disturbances   Complete by: As directed    Call MD for:  extreme fatigue   Complete by: As directed    Call MD for:  hives   Complete by: As directed    Call MD for:  persistant dizziness or light-headedness   Complete by: As directed    Call MD for:  persistant nausea and vomiting   Complete by: As directed    Call MD for:  redness, tenderness, or signs of infection (pain, swelling, redness, odor or green/yellow discharge around incision site)   Complete by: As directed    Call MD for:  severe uncontrolled pain   Complete by: As directed    Call MD for:  temperature >100.4   Complete by: As directed    Diet - low sodium heart healthy   Complete by: As directed    Discharge instructions   Complete by: As directed    You were seen for Michele Parrish COPD exacerbation and pneumonia.   You've improved with steroids, nebs, and antibiotics.  We'll discharge you with refills of your nebs.  Please schedule follow up with Dr. Joya Gaskins in [place] week or so.    I think you have depression.  We'll send you home with Michele Parrish new medication.  Continue your eye ointment and eye drops as needed for your eye pain.  Use tylenol or ibuprofen as needed for pain.   You need repeat x rays in Michele Parrish few weeks to ensure resolution of your abnormal  x ray findings.  Return for new, recurrent, or worsening symptoms.  Please ask your PCP to request records from this hospitalization so they know what was done and what the next steps will be.   Increase activity slowly   Complete by: As directed       Allergies as of 09/11/2021   No Known Allergies      Medication List     TAKE these medications    Advair HFA 230-21 MCG/ACT inhaler Generic drug: fluticasone-salmeterol INHALE 2 PUFFS INTO THE LUNGS 2 (TWO) TIMES DAILY. What changed:  when to take this reasons to take this   albuterol 108 (90 Base) MCG/ACT inhaler Commonly known as: VENTOLIN HFA Inhale 2 puffs into the lungs every 4 (four) hours as needed for wheezing or shortness of breath.   bacitracin-polymyxin b ophthalmic ointment Commonly known as: POLYSPORIN Place into the left eye 3 (three) times daily for 7 days.   furosemide 40 MG tablet Commonly known as: LASIX TAKE 1 TABLET (40 MG TOTAL) BY MOUTH 2 (TWO) TIMES DAILY. What changed:  how much to take when to  take this   gabapentin 300 MG capsule Commonly known as: NEURONTIN Take 1 capsule (300 mg total) by mouth daily. What changed:  how much to take Another medication with the same name was removed. Continue taking this medication, and follow the directions you see here.   ipratropium-albuterol 0.5-2.5 (3) MG/3ML Soln Commonly known as: DUONEB Inhale 1 vial by nebulization every 6 (six) hours as needed. What changed: reasons to take this   nicotine 14 mg/24hr patch Commonly known as: NICODERM CQ - dosed in mg/24 hours Place 1 patch (14 mg total) onto the skin daily.   nicotine polacrilex 4 MG lozenge Commonly known as: COMMIT Dissolve 1 lozenge (4 mg total) by mouth as needed for smoking cessation.   oxyCODONE 5 MG immediate release tablet Commonly known as: Roxicodone Take 1 tablet by mouth every 8 hours as needed for up to 4 doses.   polyvinyl alcohol 1.4 % ophthalmic solution Commonly  known as: LIQUIFILM TEARS Place 1 drop into both eyes as needed for dry eyes.   sertraline 50 MG tablet Commonly known as: ZOLOFT Take 1 tablet (50 mg total) by mouth daily. Start taking on: September 12, 2021   Spiriva HandiHaler 18 MCG inhalation capsule Generic drug: tiotropium PLACE 1 CAPSULE (18 MCG TOTAL) INTO INHALER AND INHALE DAILY. What changed:  when to take this reasons to take this               Durable Medical Equipment  (From admission, onward)           Start     Ordered   09/07/21 0809  For home use only DME Nebulizer machine  Once       Question Answer Comment  Patient needs Drema Eddington nebulizer to treat with the following condition COPD (chronic obstructive pulmonary disease) (HCC)   Length of Need Lifetime      09/07/21 0808           No Known Allergies  Follow-up Information     [facility] Follow [facility].   Specialty: Home Health Services Why: PT, RN, and aide                 The results of significant diagnostics from this hospitalization (including imaging, microbiology, ancillary and laboratory) are listed below for reference.    Significant Diagnostic Studies: DG Chest Port 1 View  Result Date: 09/06/2021 CLINICAL DATA:  Shortness of breath EXAM: PORTABLE CHEST 1 VIEW COMPARISON:  02/26/2020 FINDINGS: Normal heart size and mediastinal contours. Generalized interstitial coarsening correlating with emphysema. Streaky density at the lung bases, increased on the right from prior. Vague reticular density over the right mid lung chest could be early pneumonia. No visible effusion or pneumothorax. IMPRESSION: COPD with bilateral atelectasis or bronchopneumonia. Followup PA and lateral chest X-ray is recommended in 3-4 weeks following trial of antibiotic therapy to ensure resolution. Electronically Signed   By: Tiburcio PeaJonathan  Watts M.D.   On: 09/06/2021 09:49    Microbiology: Recent Results (from the past 240 hour(s))  Resp Panel  by RT-PCR (Flu Antonella Upson&B, Covid) Nasopharyngeal Swab     Status: None   Collection Time: 09/06/21  9:00 AM   Specimen: Nasopharyngeal Swab; Nasopharyngeal(NP) swabs in vial transport medium  Result Value Ref Range Status   SARS Coronavirus 2 by RT PCR NEGATIVE NEGATIVE Final    Comment: (NOTE) SARS-CoV-2 target nucleic acids are NOT DETECTED.  The SARS-CoV-2 RNA is generally detectable in upper respiratory specimens during the acute phase  of infection. The lowest concentration of SARS-CoV-2 viral copies this assay can detect is 138 copies/mL. Michele Parrish negative result does not preclude SARS-Cov-2 infection and should not be used as the sole basis for treatment or other patient management decisions. Michele Parrish negative result may occur with  improper specimen collection/handling, submission of specimen other than nasopharyngeal swab, presence of viral mutation(s) within the areas targeted by this assay, and inadequate number of viral copies(<138 copies/mL). Kayshawn Ozburn negative result must be combined with clinical observations, patient history, and epidemiological information. The expected result is Negative.  Fact Sheet for Patients:  BloggerCourse.com  Fact Sheet for Healthcare Providers:  SeriousBroker.it  This test is no t yet approved or cleared by the [place] FDA and  has been authorized for detection and/or diagnosis of SARS-CoV-2 by FDA under an Emergency Use Authorization (EUA). This EUA will remain  in effect (meaning this test can be used) for the duration of the COVID-19 declaration under Section 564(b)(1) of the Act, 21 U.S.C.section 360bbb-3(b)(1), unless the authorization is terminated  or revoked sooner.       Influenza Michele Parrish by PCR NEGATIVE NEGATIVE Final   Influenza B by PCR NEGATIVE NEGATIVE Final    Comment: (NOTE) The Xpert Xpress SARS-CoV-2/FLU/RSV plus assay is intended as an aid in the diagnosis of influenza from Nasopharyngeal swab  specimens and should not be used as Jakoby Melendrez sole basis for treatment. Nasal washings and aspirates are unacceptable for Xpert Xpress SARS-CoV-2/FLU/RSV testing.  Fact Sheet for Patients: BloggerCourse.com  Fact Sheet for Healthcare Providers: SeriousBroker.it  This test is not yet approved or cleared by the [place] FDA and has been authorized for detection and/or diagnosis of SARS-CoV-2 by FDA under an Emergency Use Authorization (EUA). This EUA will remain in effect (meaning this test can be used) for the duration of the COVID-19 declaration under Section 564(b)(1) of the Act, 21 U.S.C. section 360bbb-3(b)(1), unless the authorization is terminated or revoked.  Performed at [facility], [address] [place], [place] [address]   MRSA Next Gen by PCR, Nasal     Status: None   Collection Time: 09/06/21  1:06 PM   Specimen: Nasal Mucosa; Nasal Swab  Result Value Ref Range Status   MRSA by PCR Next Gen NOT DETECTED NOT DETECTED Final    Comment: (NOTE) The GeneXpert MRSA Assay (FDA approved for NASAL specimens only), is one component of Michele Parrish comprehensive MRSA colonization surveillance program. It is not intended to diagnose MRSA infection nor to guide or monitor treatment for MRSA infections. Test performance is not FDA approved in patients less than 45 years old. Performed at [facility], [address] [place], [place] [address]      Labs: Basic Metabolic Panel: Recent Labs  Lab 09/06/21 0900 09/06/21 1341 09/07/21 0309 09/08/21 0421 09/10/21 0423 09/11/21 0417  NA 135  --  135 138 140 141  K 2.7*  --  4.1 4.3 3.7 3.4*  CL 98  --  103 102 98 101  CO2 24  --  26 29 34* 33*  GLUCOSE 145*  --  132* 118* 118* 100*  BUN 8  --  24* 20 17 25*  CREATININE 1.07* 1.59* 1.22* 0.77 0.84 0.96  CALCIUM 8.9  --  8.5* 8.7* 9.0 8.7*  MG  --  2.6*  --  2.3  --  2.4  PHOS  --   --   --   --    --  4.1   Liver  Function Tests: Recent Labs  Lab 09/06/21 0900 09/07/21 0309 09/11/21 0417  AST 32 31 19  ALT 14 11 15   ALKPHOS 79 65 59  BILITOT 0.8 0.3 0.5  PROT 9.3* 8.2* 7.9  ALBUMIN 3.9 3.3* 3.4*   No results for input(s): LIPASE, AMYLASE in the last 168 hours. No results for input(s): AMMONIA in the last 168 hours. CBC: Recent Labs  Lab 09/06/21 0900 09/06/21 1341 09/07/21 0309 09/10/21 0423 09/11/21 0417  WBC 8.0 8.3 9.3 9.5 10.3  NEUTROABS 4.9  --   --   --  5.2  HGB 13.8 13.7 12.0 13.6 13.6  HCT 44.4 43.4 39.1 43.8 44.8  MCV 84.7 85.4 85.9 85.7 86.7  PLT 159 165 158 255 292   Cardiac Enzymes: No results for input(s): CKTOTAL, CKMB, CKMBINDEX, TROPONINI in the last 168 hours. BNP: BNP (last 3 results) Recent Labs    09/06/21 1342  BNP 79.8    ProBNP (last 3 results) No results for input(s): PROBNP in the last 8760 hours.  CBG: No results for input(s): GLUCAP in the last 168 hours.     Signed:  Fayrene Helper MD.  [facility] 09/11/2021, 1:56 PM

## 2021-09-11 NOTE — Clinical Social Work Note (Signed)
CSW notified patient will need to discharge home with an oxygen tank as she does not have one here. CSW spoke with Ian Malkin with [employer] who stated a tank can be delivered to her room in less than an hour. CSW spoke with patient who refused to wait for the tank as her ride is here. CSW updated Ian Malkin and [employer] fundraiser. TOC signing off.

## 2021-09-11 NOTE — Consult Note (Signed)
[facility] Face-to-Face Psychiatry Consult   Reason for Consult: Suicidal ideation Referring Physician: Dr. Florene Glen Patient Identification: Michele Parrish MRN:  [id number] Principal Diagnosis: COPD with acute exacerbation [facility]) Diagnosis:  Principal Problem:   COPD with acute exacerbation ([place]) Active Problems:   Tobacco abuse   Moderate to severe pulmonary hypertension (HCC)   Chronic diastolic heart failure (HCC)   CKD (chronic kidney disease), stage II   Acute on chronic respiratory failure with hypoxia (HCC)   Bronchopneumonia   Conjunctivitis   Corneal abrasion, left   Depression   Prolonged QT interval   Total Time spent with patient: 30 minutes  Subjective:   Michele Parrish is a 55 y.o. female patient admitted with COPD exacerbation. Psych consult was placed for suicidal ideations.  Patient is seen and assessed by the psychiatric nurse practitioner.  Patient initially declined psychiatric evaluation at all psychiatric services.  However after speaking with her she became less guarded, and showed willingness to engage.  Patient does state" I said some things that I did not mean.  I was in a lot of pain.  I am not going to hurt myself.  And you do not have to come here.  I am interested in taking antidepressant medication, but that is about it.  I am sometimes very hard on myself when I should not be, but I am not depressed nor my suicidal."  She denies any previous psychiatric diagnosis to include depression, bipolar disorder, schizophrenia, adjustment disorder.  She further denies any depressive symptoms, anxiety, mania, paranoia, and or psychosis.  She denies any previous psychiatric history of suicide attempt, suicidal ideations, suicidal thoughts, and or nonsuicidal self-injurious behaviors.  Patient further denies any history of homicidal ideations, violent or aggressive behavior, agitation, and or pending legal charges.  She denies any illicit substance use, no additional urine  toxicology has been obtained since 2011.  Patient further denies any current outpatient and or recent outpatient psychiatric providers.  She denies any previous psychiatric inpatient admissions.  At present she continues to deny suicidal ideations, homicidal ideations, and or auditory or visual hallucinations.  Patient will be psychiatrically cleared at this time.  It is safe to discharge patient home with sertraline.  We will update her AVS to include providers in the area that accept Medicaid.   HPI:  Pt is 56 yo female admitted on 09/06/21 with shortness of breath/cough and found to have COPD exacerbation and PNE.  Pt additionally with conjunctivitis in L eye. Pt is on 5 L O2 during day and 6 L at night at home; requiring as high as 15 L this admission.  Pt with hx of COPD, pulmonary arterial HTN, CHF, tobacco use disorder.  Past Psychiatric History:   Risk to Self:   Risk to Others:   Prior Inpatient Therapy:   Prior Outpatient Therapy:    Past Medical History:  Past Medical History:  Diagnosis Date   Acute exacerbation of CHF (congestive heart failure) ([place]) 02/22/2020   Bronchitis    Pericardial effusion    Pulmonary hypertension ([place])     Past Surgical History:  Procedure Laterality Date   ABDOMINAL HYSTERECTOMY     RIGHT HEART CATH N/A 02/23/2020   Procedure: RIGHT HEART CATH;  Surgeon: Wellington Hampshire, MD;  Location: [place] [facility];  Service: Cardiovascular;  Laterality: N/A;   Family History:  Family History  Problem Relation Age of Onset   Prostate cancer Father    Stroke Father    Multiple  sclerosis Mother    Family Psychiatric  History: Denies Social History:  Social History   Substance and Sexual Activity  Alcohol Use Yes   Comment: 2 beers per day     Social History   Substance and Sexual Activity  Drug Use No    Social History   Socioeconomic History   Marital status: Single    Spouse name: Not on file   Number of children: Not on file   Years of  education: Not on file   Highest education level: Not on file  Occupational History   Not on file  Tobacco Use   Smoking status: Former    Packs/day: 1.50    Years: 42.00    Pack years: 63.00    Types: Cigarettes    Quit date: 02/18/2020    Years since quitting: 1.5   Smokeless tobacco: Never  Vaping Use   Vaping Use: Never used  Substance and Sexual Activity   Alcohol use: Yes    Comment: 2 beers per day   Drug use: No   Sexual activity: Not on file  Other Topics Concern   Not on file  Social History Narrative   Not on file   Social Determinants of Health   Financial Resource Strain: Not on file  Food Insecurity: Not on file  Transportation Needs: Not on file  Physical Activity: Not on file  Stress: Not on file  Social Connections: Not on file   Additional Social History:    Allergies:  No Known Allergies  Labs:  Results for orders placed or performed during the hospital encounter of 09/06/21 (from the past 48 hour(s))  CBC     Status: None   Collection Time: 09/10/21  4:23 AM  Result Value Ref Range   WBC 9.5 4.0 - 10.5 K/uL   RBC 5.11 3.87 - 5.11 MIL/uL   Hemoglobin 13.6 12.0 - 15.0 g/dL   HCT 43.8 36.0 - 46.0 %   MCV 85.7 80.0 - 100.0 fL   MCH 26.6 26.0 - 34.0 pg   MCHC 31.1 30.0 - 36.0 g/dL   RDW 14.5 11.5 - 15.5 %   Platelets 255 150 - 400 K/uL   nRBC 0.0 0.0 - 0.2 %    Comment: Performed at [facility], [place] [address], [place], [place] [address]  Basic metabolic panel     Status: Abnormal   Collection Time: 09/10/21  4:23 AM  Result Value Ref Range   Sodium 140 135 - 145 mmol/L   Potassium 3.7 3.5 - 5.1 mmol/L   Chloride 98 98 - 111 mmol/L   CO2 34 (H) 22 - 32 mmol/L   Glucose, Bld 118 (H) 70 - 99 mg/dL    Comment: Glucose reference range applies only to samples taken after fasting for at least 8 hours.   BUN 17 6 - 20 mg/dL   Creatinine, Ser 0.84 0.44 - 1.00 mg/dL   Calcium 9.0 8.9 - 10.3 mg/dL   GFR, Estimated >60 >60  mL/min    Comment: (NOTE) Calculated using the CKD-EPI Creatinine Equation (2021)    Anion gap 8 5 - 15    Comment: Performed at [facility], [place] [address] [place], [place] [address]  CBC with Differential/Platelet     Status: Abnormal   Collection Time: 09/11/21  4:17 AM  Result Value Ref Range   WBC 10.3 4.0 - 10.5 K/uL   RBC 5.17 (H) 3.87 - 5.11 MIL/uL   Hemoglobin  13.6 12.0 - 15.0 g/dL   HCT 44.8 36.0 - 46.0 %   MCV 86.7 80.0 - 100.0 fL   MCH 26.3 26.0 - 34.0 pg   MCHC 30.4 30.0 - 36.0 g/dL   RDW 14.4 11.5 - 15.5 %   Platelets 292 150 - 400 K/uL   nRBC 0.0 0.0 - 0.2 %   Neutrophils Relative % 50 %   Neutro Abs 5.2 1.7 - 7.7 K/uL   Lymphocytes Relative 41 %   Lymphs Abs 4.2 (H) 0.7 - 4.0 K/uL   Monocytes Relative 7 %   Monocytes Absolute 0.7 0.1 - 1.0 K/uL   Eosinophils Relative 1 %   Eosinophils Absolute 0.1 0.0 - 0.5 K/uL   Basophils Relative 1 %   Basophils Absolute 0.1 0.0 - 0.1 K/uL   Immature Granulocytes 0 %   Abs Immature Granulocytes 0.03 0.00 - 0.07 K/uL    Comment: Performed at [facility], [place] [address], [place], [place] [address]  Comprehensive metabolic panel     Status: Abnormal   Collection Time: 09/11/21  4:17 AM  Result Value Ref Range   Sodium 141 135 - 145 mmol/L   Potassium 3.4 (L) 3.5 - 5.1 mmol/L   Chloride 101 98 - 111 mmol/L   CO2 33 (H) 22 - 32 mmol/L   Glucose, Bld 100 (H) 70 - 99 mg/dL    Comment: Glucose reference range applies only to samples taken after fasting for at least 8 hours.   BUN 25 (H) 6 - 20 mg/dL   Creatinine, Ser 0.96 0.44 - 1.00 mg/dL   Calcium 8.7 (L) 8.9 - 10.3 mg/dL   Total Protein 7.9 6.5 - 8.1 g/dL   Albumin 3.4 (L) 3.5 - 5.0 g/dL   AST 19 15 - 41 U/L   ALT 15 0 - 44 U/L   Alkaline Phosphatase 59 38 - 126 U/L   Total Bilirubin 0.5 0.3 - 1.2 mg/dL   GFR, Estimated >60 >60 mL/min    Comment: (NOTE) Calculated using the CKD-EPI Creatinine Equation (2021)    Anion gap 7 5  - 15    Comment: Performed at [facility], [place] [address], [place], [place] [address]  Magnesium     Status: None   Collection Time: 09/11/21  4:17 AM  Result Value Ref Range   Magnesium 2.4 1.7 - 2.4 mg/dL    Comment: Performed at [facility], [place] [address], [place], [place] [address]  Phosphorus     Status: None   Collection Time: 09/11/21  4:17 AM  Result Value Ref Range   Phosphorus 4.1 2.5 - 4.6 mg/dL    Comment: Performed at [facility], [place] [address] [place], [place] [address]    Current Facility-Administered Medications  Medication Dose Route Frequency Provider Last Rate Last Admin   acetaminophen (TYLENOL) tablet 1,000 mg  1,000 mg Oral Q8H Elodia Florence., MD   1,000 mg at 09/11/21 0532   Followed by   Derrill Memo ON 09/13/2021] acetaminophen (TYLENOL) tablet 650 mg  650 mg Oral Q6H PRN Elodia Florence., MD       albuterol (PROVENTIL) (2.5 MG/3ML) 0.083% nebulizer solution 2.5 mg  2.5 mg Nebulization Q4H PRN [place] Eric J, DO       arformoterol [facility]) nebulizer solution 15 mcg  15 mcg Nebulization BID [place] Eric J, DO   15 mcg at 09/11/21 4503   bacitracin-polymyxin b (POLYSPORIN) ophthalmic ointment  Left Eye TID Elodia Florence., MD   1 application at 14/97/02 6378   benzonatate (TESSALON) capsule 100 mg  100 mg Oral TID PRN [place] Eric J, DO   100 mg at 09/07/21 1642   budesonide (PULMICORT) nebulizer solution 0.5 mg  0.5 mg Nebulization BID [place] Eric J, DO   0.5 mg at 09/11/21 0757   chlorhexidine gluconate (MEDLINE KIT) (PERIDEX) 0.12 % solution 15 mL  15 mL Mouth Rinse BID Marylyn Ishihara, Tyrone A, DO   15 mL at 09/10/21 2037   Chlorhexidine Gluconate Cloth 2 % PADS 6 each  6 each Topical Daily Marylyn Ishihara, Tyrone A, DO   6 each at 09/11/21 0929   enoxaparin (LOVENOX) injection 40 mg  40 mg Subcutaneous Q24H Kyle, Tyrone A, DO   40 mg at 09/10/21 1758   furosemide (LASIX) tablet 40 mg  40 mg Oral  Daily [place] Eric J, DO   40 mg at 09/11/21 5885   guaiFENesin (MUCINEX) 12 hr tablet 600 mg  600 mg Oral BID [place] Eric J, DO   600 mg at 09/07/21 1215   ipratropium-albuterol (DUONEB) 0.5-2.5 (3) MG/3ML nebulizer solution 3 mL  3 mL Nebulization BID [place] Donnamarie Poag, DO   3 mL at 09/11/21 0757   ketorolac (TORADOL) 15 MG/ML injection 15 mg  15 mg Intravenous Q6H PRN Elodia Florence., MD   15 mg at 09/10/21 2037   MEDLINE mouth rinse  15 mL Mouth Rinse q12n4p Kyle, Tyrone A, DO   15 mL at 09/10/21 1000   menthol-cetylpyridinium (CEPACOL) lozenge 3 mg  1 lozenge Oral PRN [place] Eric J, DO       naphazoline-glycerin (CLEAR EYES REDNESS) ophth solution 1-2 drop  1-2 drop Both Eyes QID PRN [place] Donnamarie Poag, DO   2 drop at 09/08/21 0103   ondansetron (ZOFRAN) tablet 4 mg  4 mg Oral Q6H PRN Cherylann Ratel A, DO       Or   ondansetron (ZOFRAN) injection 4 mg  4 mg Intravenous Q6H PRN Marylyn Ishihara, Tyrone A, DO       oxyCODONE (Oxy IR/ROXICODONE) immediate release tablet 5 mg  5 mg Oral Q4H PRN Elodia Florence., MD       phenol [facility]) mouth spray 1 spray  1 spray Mouth/Throat PRN Elodia Florence., MD   1 spray at 09/10/21 0277   polyvinyl alcohol (LIQUIFILM TEARS) 1.4 % ophthalmic solution 1 drop  1 drop Both Eyes PRN Elodia Florence., MD       sertraline (ZOLOFT) tablet 50 mg  50 mg Oral Daily Elodia Florence., MD   50 mg at 09/11/21 4128   traMADol (ULTRAM) tablet 50 mg  50 mg Oral Q6H PRN [place] Eric J, DO   50 mg at 09/11/21 0532    Musculoskeletal: Strength & Muscle Tone: within normal limits Gait & Station: normal Patient leans: N/A            Psychiatric Specialty Exam:  Presentation  General Appearance: Appropriate for Environment; Casual  Eye Contact:Fair  Speech:Clear and Coherent; Normal Rate  Speech Volume:Normal  Handedness:Right   Mood and Affect  Mood:Euthymic  Affect:Appropriate; Congruent   Thought Process  Thought  Processes:Coherent; Goal Directed; Linear  Descriptions of Associations:Intact  Orientation:Full (Time, Place and Person)  Thought Content:WDL  History of Schizophrenia/Schizoaffective disorder:No data recorded Duration of Psychotic Symptoms:No data recorded Hallucinations:Hallucinations: None  Ideas of Reference:None  Suicidal Thoughts:Suicidal Thoughts: No  Homicidal Thoughts:Homicidal  Thoughts: No   Sensorium  Memory:No data recorded Judgment:Good  Insight:Good   Executive Functions  Concentration:Good  Attention Span:Good  [place] of Knowledge:Good  Language:Good   Psychomotor Activity  Psychomotor Activity:Psychomotor Activity: Normal   Assets  Assets:Communication Skills; Desire for Improvement; Financial Resources/Insurance; Housing; Physical Health   Sleep  Sleep:Sleep: Good   Physical Exam: Physical Exam ROS Blood pressure (!) 131/91, pulse 97, temperature 97.8 F (36.6 C), temperature source Oral, resp. rate 18, height 5' 2"  (1.575 m), weight 61.2 kg, SpO2 92 %. Body mass index is 24.69 kg/m.  Treatment Plan Summary: Plan Continue sertaline. WIll refer to [facility] for outpatient management.  She declines any current outpatient resources, however they have been provided for her at this time.   Disposition: No evidence of imminent risk to self or others at present.   Patient does not meet criteria for psychiatric inpatient admission. Supportive therapy provided about ongoing stressors. Discussed crisis plan, support from social network, calling 911, coming to the Emergency Department, and calling Suicide Hotline.  Suella Broad, FNP 09/11/2021 1:45 PM

## 2021-09-11 NOTE — Plan of Care (Signed)

## 2021-09-12 ENCOUNTER — Other Ambulatory Visit (HOSPITAL_COMMUNITY): Payer: Self-pay

## 2021-09-12 ENCOUNTER — Telehealth: Payer: Self-pay

## 2021-09-12 NOTE — Telephone Encounter (Signed)
Transition Care Management Unsuccessful Follow-up Telephone Call  Date of discharge and from where:  [facility] on 09/11/2021  Attempts:  1st Attempt  Reason for unsuccessful TCM follow-up call:  Left voice message unable to reach.

## 2021-09-15 ENCOUNTER — Telehealth: Payer: Self-pay

## 2021-09-15 NOTE — Telephone Encounter (Signed)
Transition Care Management Unsuccessful Follow-up Telephone Call  Date of discharge and from where:  09/11/2021, [facility]  Attempts:  2nd Attempt  Reason for unsuccessful TCM follow-up call:  Left voice message on # [phone]. Call back requested to this CM.  Need to discuss scheduling a follow up appointment with PCP

## 2021-09-16 ENCOUNTER — Telehealth: Payer: Self-pay

## 2021-09-16 NOTE — Telephone Encounter (Signed)
Transition Care Management Unsuccessful Follow-up Telephone Call  Date of discharge and from where:  09/11/2021, [facility]   Attempts:  3rd Attempt  Reason for unsuccessful TCM follow-up call:  Left voice message  on # [phone]. Call back requested to this CM.   Letter sent to patient requesting she contact [facility] to schedule follow up appointment as we have not been able to reach her.

## 2021-10-03 ENCOUNTER — Other Ambulatory Visit: Payer: Self-pay

## 2021-11-05 ENCOUNTER — Telehealth: Payer: Self-pay | Admitting: *Deleted

## 2021-11-05 NOTE — Telephone Encounter (Signed)
Patient needs letter stating she needs electricity to remain on d/t need for oxygen.   Letter in media- 11/22/2020 Notes in encounter- 12/20/2020  Patient states [employer] lost paperwork to reinstate installment plan. She needs paperwork faxed again. Informed patient that the fax confirmation no longer available. Fax confirmation was shredded.   Patient called [employer] while on the phone. Per representative, the only options are for forms to be mailed to [facility] and for forms to be mailed back to [employer].   [place]-  Reference number from [employer]- [id number]-

## 2021-11-05 NOTE — Telephone Encounter (Signed)
Copied from CRM [id number]. Topic: General - Other >> Nov 05, 2021  2:33 PM Pawlus, Maxine Glenn A wrote: Reason for CRM: Pt called in asking for paperwork to be sent to [facility] since she is on Oxygen and cant have her power /electricity turned off.

## 2021-11-11 ENCOUNTER — Encounter: Payer: Self-pay | Admitting: Nurse Practitioner

## 2021-11-11 ENCOUNTER — Ambulatory Visit: Payer: Medicaid Other | Attending: Nurse Practitioner | Admitting: Nurse Practitioner

## 2021-11-11 ENCOUNTER — Other Ambulatory Visit: Payer: Self-pay

## 2021-11-11 DIAGNOSIS — J449 Chronic obstructive pulmonary disease, unspecified: Secondary | ICD-10-CM | POA: Diagnosis not present

## 2021-11-11 DIAGNOSIS — I5032 Chronic diastolic (congestive) heart failure: Secondary | ICD-10-CM

## 2021-11-11 DIAGNOSIS — J9611 Chronic respiratory failure with hypoxia: Secondary | ICD-10-CM

## 2021-11-11 DIAGNOSIS — Z720 Tobacco use: Secondary | ICD-10-CM

## 2021-11-11 DIAGNOSIS — F321 Major depressive disorder, single episode, moderate: Secondary | ICD-10-CM

## 2021-11-11 MED ORDER — FUROSEMIDE 40 MG PO TABS
40.0000 mg | ORAL_TABLET | Freq: Two times a day (BID) | ORAL | 1 refills | Status: DC
  Filled 2021-11-11: qty 60, 30d supply, fill #0

## 2021-11-11 MED ORDER — NICOTINE 14 MG/24HR TD PT24
14.0000 mg | MEDICATED_PATCH | Freq: Every day | TRANSDERMAL | 0 refills | Status: DC
  Filled 2021-11-11: qty 28, 28d supply, fill #0

## 2021-11-11 MED ORDER — IPRATROPIUM-ALBUTEROL 0.5-2.5 (3) MG/3ML IN SOLN
3.0000 mL | Freq: Four times a day (QID) | RESPIRATORY_TRACT | 99 refills | Status: DC | PRN
  Filled 2021-11-11: qty 360, 30d supply, fill #0

## 2021-11-11 MED ORDER — FLUTICASONE-SALMETEROL 230-21 MCG/ACT IN AERO
2.0000 | INHALATION_SPRAY | Freq: Two times a day (BID) | RESPIRATORY_TRACT | 1 refills | Status: DC
  Filled 2021-11-11 – 2022-02-25 (×2): qty 12, 30d supply, fill #0

## 2021-11-11 MED ORDER — GABAPENTIN 300 MG PO CAPS
300.0000 mg | ORAL_CAPSULE | Freq: Every day | ORAL | 1 refills | Status: DC
  Filled 2021-11-11: qty 30, 30d supply, fill #0
  Filled 2021-12-29: qty 30, 30d supply, fill #1

## 2021-11-11 MED ORDER — TIOTROPIUM BROMIDE MONOHYDRATE 18 MCG IN CAPS
1.0000 | ORAL_CAPSULE | Freq: Every day | RESPIRATORY_TRACT | 1 refills | Status: DC
  Filled 2021-11-11 – 2022-02-25 (×2): qty 30, 30d supply, fill #0

## 2021-11-11 MED ORDER — ALBUTEROL SULFATE HFA 108 (90 BASE) MCG/ACT IN AERS
2.0000 | INHALATION_SPRAY | RESPIRATORY_TRACT | 0 refills | Status: DC | PRN
  Filled 2021-11-11: qty 18, 16d supply, fill #0

## 2021-11-11 MED ORDER — SERTRALINE HCL 50 MG PO TABS
50.0000 mg | ORAL_TABLET | Freq: Every day | ORAL | 1 refills | Status: DC
  Filled 2021-11-11: qty 30, 30d supply, fill #0
  Filled 2021-12-29: qty 30, 30d supply, fill #1

## 2021-11-11 NOTE — Progress Notes (Signed)
Virtual Visit via Telephone Note Due to national recommendations of social distancing due to [place] 19, telehealth visit is felt to be most appropriate for this patient at this time.  I discussed the limitations, risks, security and privacy concerns of performing an evaluation and management service by telephone and the availability of in person appointments. I also discussed with the patient that there may be a patient responsible charge related to this service. The patient expressed understanding and agreed to proceed.    I connected with Michele Parrish on 11/11/21  at   8:30 AM EST  EDT by telephone and verified that I am speaking with the correct person using two identifiers.  Location of Patient: Private Residence   Location of Provider: [place] and [place] participating in Telemedicine visit: Michele Rankins FNP-BC [place]    History of Present Illness: Telemedicine visit for: Forms  She is requesting a new physicians verification form be filled out for Michele Parrish in order "to keep my power on". Her PCP completed this form and it was faxed 11-22-2020. Apparently [employer] is requesting a new form. This form will need to be completed and signed again by a physician for annual renewal. I did speak with a representative from [place] and I was unable to have them fax the form as the patient will need to give her approval for the form to be faxed to the office. Patient was also instructed that the form could be emailed by Michele Parrish with her permission. Otherwise the form will have to be mailed to the patient and the patient will need to drop the form off. It is now her responsibility to contact [facility] and instruct them on how she would like the forms to be sent. She states she is very limited with her mobility due to her respiratory condition and just talking makes her short of breath. I did instruct her that she is very overdue for office  visits with her PCP and her Pulmonologist and Cardiologist and needs face to face follow up as soon as possible.   She is also requesting that I contact [facility] and update them regarding her O2 status so that her oxygen equipment is not denied and continues to be covered by her insurance. Apparently this requires annual renewal.    She has chronic hypoxic respiratory failure on chronic O2 due to COPD and pulmonary HTN. Documented O2 sats 87% on RA. She requires 4-6 L supplemental oxygen 24/7 to increase sats above 95%. Current treatments include nebulizers and steroid inhalers/bronchodilators. At this time we will send orders to her DME company for O2 and Home fill Concentrator.  Past Medical History:  Diagnosis Date   Acute exacerbation of CHF (congestive heart failure) ([place]) 02/22/2020   Bronchitis    Pericardial effusion    Pulmonary hypertension ([place])     Past Surgical History:  Procedure Laterality Date   ABDOMINAL HYSTERECTOMY     RIGHT HEART CATH N/A 02/23/2020   Procedure: RIGHT HEART CATH;  Surgeon: Wellington Hampshire, MD;  Location: [place] [facility];  Service: Cardiovascular;  Laterality: N/A;    Family History  Problem Relation Age of Onset   Prostate cancer Father    Stroke Father    Multiple sclerosis Mother     Social History   Socioeconomic History   Marital status: Single    Spouse name: Not on file   Number of children: Not on  file   Years of education: Not on file   Highest education level: Not on file  Occupational History   Not on file  Tobacco Use   Smoking status: Former    Packs/day: 1.50    Years: 42.00    Pack years: 63.00    Types: Cigarettes    Quit date: 02/18/2020    Years since quitting: 1.7   Smokeless tobacco: Never  Vaping Use   Vaping Use: Never used  Substance and Sexual Activity   Alcohol use: Yes    Comment: 2 beers per day   Drug use: No   Sexual activity: Not on file  Other Topics Concern   Not on file  Social History  Narrative   Not on file   Social Determinants of Health   Financial Resource Strain: Not on file  Food Insecurity: Not on file  Transportation Needs: Not on file  Physical Activity: Not on file  Stress: Not on file  Social Connections: Not on file     Observations/Objective: Awake, alert and oriented x 3   ROS  Assessment and Plan: Diagnoses and all orders for this visit:  Chronic respiratory failure with hypoxia ([place]) -     For home use only DME oxygen  COPD with chronic bronchitis ([place]) -     ipratropium-albuterol (DUONEB) 0.5-2.5 (3) MG/3ML SOLN; Take 3 mLs by nebulization every 6 (six) hours as needed. Patient requests scripts be mailed -     tiotropium (SPIRIVA) 18 MCG inhalation capsule; Place 1 capsule (18 mcg total) into inhaler and inhale daily. Patient requests meds be mailed -     fluticasone-salmeterol (ADVAIR HFA) 230-21 MCG/ACT inhaler; Inhale 2 puffs into the lungs 2 (two) times daily. Patient requests meds be mailed -     albuterol (VENTOLIN HFA) 108 (90 Base) MCG/ACT inhaler; Inhale 2 puffs into the lungs every 4 (four) hours as needed for wheezing or shortness of breath. Patient requests meds be mailed NEEDS PULMONOLOGY F/U  Chronic diastolic heart failure (HCC) -     furosemide (LASIX) 40 MG tablet; Take 1 tablet (40 mg total) by mouth 2 (two) times daily. Patient requests meds be mailed NEEDS CARDIOLOGY F/U  Current moderate episode of major depressive disorder without prior episode (HCC) -     sertraline (ZOLOFT) 50 MG tablet; Take 1 tablet (50 mg total) by mouth daily. Patient requests meds be mailed  Tobacco abuse -     nicotine (NICODERM CQ - DOSED IN MG/24 HOURS) 14 mg/24hr patch; Place 1 patch (14 mg total) onto the skin daily.  Other orders -     gabapentin (NEURONTIN) 300 MG capsule; Take 1 capsule (300 mg total) by mouth daily. Patient requests meds be mailed     Follow Up Instructions Return if symptoms worsen or fail to improve.     I  discussed the assessment and treatment plan with the patient. The patient was provided an opportunity to ask questions and all were answered. The patient agreed with the plan and demonstrated an understanding of the instructions.   The patient was advised to call back or seek an in-person evaluation if the symptoms worsen or if the condition fails to improve as anticipated.  I provided 20 minutes of non-face-to-face time during this encounter including median intraservice time, reviewing previous notes, labs, imaging, medications and explaining diagnosis and management.  Gildardo Pounds, FNP-BC

## 2021-11-12 ENCOUNTER — Other Ambulatory Visit: Payer: Self-pay

## 2021-11-12 NOTE — Telephone Encounter (Signed)
Pt is calling again in regards to Michele Parrish and asking Carilyn Goodpasture to fu with call [phone]

## 2021-11-13 ENCOUNTER — Other Ambulatory Visit: Payer: Self-pay

## 2021-11-13 NOTE — Telephone Encounter (Signed)
Pt called back in stating to speak with Carilyn Goodpasture to speak about [employer] forms they are faxing over to be signed.

## 2021-11-13 NOTE — Telephone Encounter (Addendum)
Informed patient that the forms have not came from [employer]. Advised to call and give email address. Patient states eyes are good today. Unable to take email address so forms can be faxed/ scanned directly to me.   Spoke to [employer] with patient on the phone. Advised to email forms to my email- [email or web address]. Representative states she could not use my email would have to use patient's.    Was given reference number [id number].   Will await paperwork from [employer] to arrive to office.

## 2021-12-29 ENCOUNTER — Other Ambulatory Visit: Payer: Self-pay

## 2022-01-08 ENCOUNTER — Ambulatory Visit: Payer: Medicaid Other | Admitting: Physician Assistant

## 2022-01-23 ENCOUNTER — Other Ambulatory Visit: Payer: Self-pay

## 2022-01-23 ENCOUNTER — Other Ambulatory Visit: Payer: Self-pay | Admitting: Nurse Practitioner

## 2022-01-23 DIAGNOSIS — F321 Major depressive disorder, single episode, moderate: Secondary | ICD-10-CM

## 2022-01-23 MED ORDER — SERTRALINE HCL 50 MG PO TABS
50.0000 mg | ORAL_TABLET | Freq: Every day | ORAL | 0 refills | Status: DC
  Filled 2022-01-23: qty 30, 30d supply, fill #0

## 2022-01-26 ENCOUNTER — Other Ambulatory Visit: Payer: Self-pay

## 2022-02-13 IMAGING — MR MR CARD MORPHOLOGY WO/W CM
45 of 48 series · 45 of 48 positions shown · IV contrast (gadavist)
Comparison: none

CLINICAL DATA: 53-year-old female with newly diagnosed right sided
CHF and severe pulmonary hypertension.

EXAM:
CARDIAC MRI
TECHNIQUE: The patient was scanned on a 1.5 Tesla GE magnet. A dedicated
cardiac coil was used. Functional imaging was done using [employer]
sequences. [DATE], and 4 chamber views were done to assess for RWMA's.
Modified Rantona rule using a short axis stack was used to
calculate an ejection fraction on a dedicated work station using
[employer] software. The patient received 8 cc of Gadavist. After 10
minutes inversion recovery sequences were used to assess for
infiltration and scar tissue.
CONTRAST:  8 cc  of Gadavist

[Series 7: bSSFP · sagittal · 8.0mm · 1.61mm/px · 1 of 25 slices shown (1 of 15)]
[im 1/25]
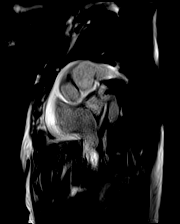

[Series 7: bSSFP · sagittal · 8.0mm · 1.61mm/px · 1 of 25 slices shown (2 of 15)]
[im 1/25]
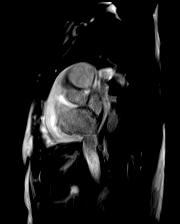

[Series 7: bSSFP · sagittal · 8.0mm · 1.61mm/px · 1 of 25 slices shown (3 of 15)]
[im 1/25]
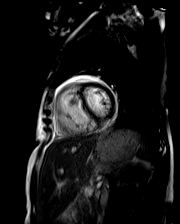

[Series 7: bSSFP · sagittal · 8.0mm · 1.61mm/px · 1 of 25 slices shown (4 of 15)]
[im 1/25]
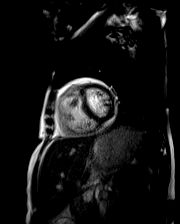

[Series 7: bSSFP · sagittal · 8.0mm · 1.61mm/px · 1 of 25 slices shown (5 of 15)]
[im 1/25]
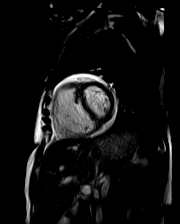

[Series 7: bSSFP · sagittal · 8.0mm · 1.61mm/px · 1 of 25 slices shown (6 of 15)]
[im 1/25]
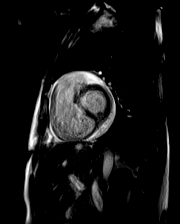

[Series 7: bSSFP · sagittal · 8.0mm · 1.61mm/px · 1 of 25 slices shown (7 of 15)]
[im 1/25]
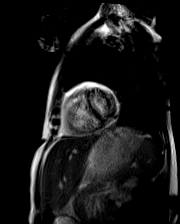

[Series 7: bSSFP · sagittal · 8.0mm · 1.61mm/px · 1 of 25 slices shown (8 of 15)]
[im 1/25]
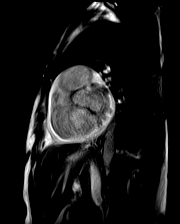

[Series 7: bSSFP · sagittal · 8.0mm · 1.61mm/px · 1 of 25 slices shown (9 of 15)]
[im 1/25]
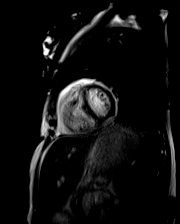

[Series 7: bSSFP · sagittal · 8.0mm · 1.61mm/px · 1 of 25 slices shown (10 of 15)]
[im 1/25]
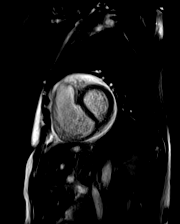

[Series 7: bSSFP · sagittal · 8.0mm · 1.61mm/px · 1 of 25 slices shown (11 of 15)]
[im 1/25]
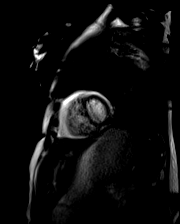

[Series 7: bSSFP · sagittal · 8.0mm · 1.61mm/px · 1 of 25 slices shown (12 of 15)]
[im 1/25]
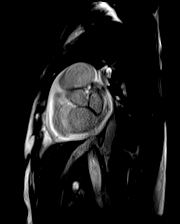

[Series 8: cine_trufi_cs_rt_short axis · sagittal · 8.0mm · 1.73mm/px · 1 of 49 slices shown (1 of 19)]
[im 1/49]
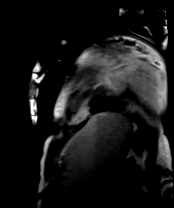

[Series 8: cine_trufi_cs_rt_short axis · sagittal · 8.0mm · 1.73mm/px · 1 of 49 slices shown (2 of 19)]
[im 1/49]
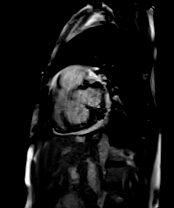

[Series 8: cine_trufi_cs_rt_short axis · sagittal · 8.0mm · 1.73mm/px · 1 of 49 slices shown (3 of 19)]
[im 1/49]
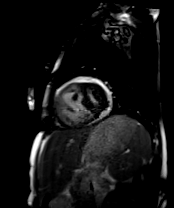

[Series 8: cine_trufi_cs_rt_short axis · sagittal · 8.0mm · 1.73mm/px · 1 of 49 slices shown (4 of 19)]
[im 1/49]
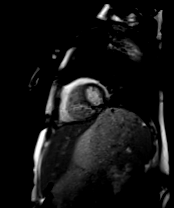

[Series 8: cine_trufi_cs_rt_short axis · sagittal · 8.0mm · 1.73mm/px · 1 of 49 slices shown (5 of 19)]
[im 1/49]
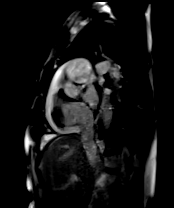

[Series 8: cine_trufi_cs_rt_short axis · sagittal · 8.0mm · 1.73mm/px · 1 of 49 slices shown (6 of 19)]
[im 1/49]
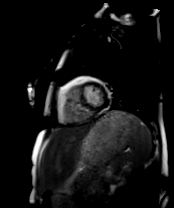

[Series 8: cine_trufi_cs_rt_short axis · sagittal · 8.0mm · 1.73mm/px · 1 of 49 slices shown (7 of 19)]
[im 1/49]
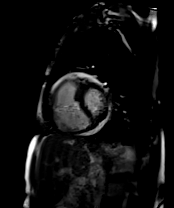

[Series 8: cine_trufi_cs_rt_short axis · sagittal · 8.0mm · 1.73mm/px · 1 of 49 slices shown (8 of 19)]
[im 1/49]
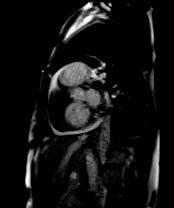

[Series 8: cine_trufi_cs_rt_short axis · sagittal · 8.0mm · 1.73mm/px · 1 of 49 slices shown (9 of 19)]
[im 1/49]
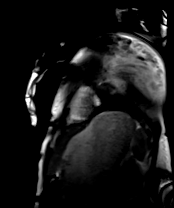

[Series 8: cine_trufi_cs_rt_short axis · sagittal · 8.0mm · 1.73mm/px · 1 of 49 slices shown (10 of 19)]
[im 1/49]
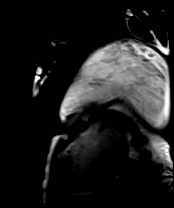

[Series 8: cine_trufi_cs_rt_short axis · sagittal · 8.0mm · 1.73mm/px · 1 of 49 slices shown (11 of 19)]
[im 1/49]
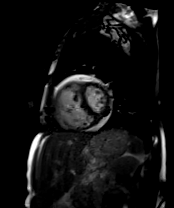

[Series 8: cine_trufi_cs_rt_short axis · sagittal · 8.0mm · 1.73mm/px · 1 of 49 slices shown (12 of 19)]
[im 1/49]
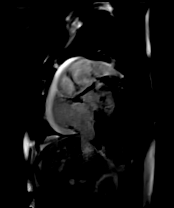

[Series 8: cine_trufi_cs_rt_short axis · sagittal · 8.0mm · 1.73mm/px · 1 of 49 slices shown (13 of 19)]
[im 1/49]
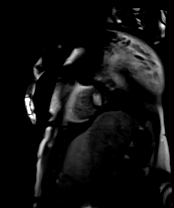

[Series 8: cine_trufi_cs_rt_short axis · sagittal · 8.0mm · 1.73mm/px · 1 of 49 slices shown (14 of 19)]
[im 1/49]
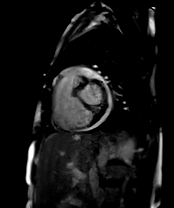

[Series 8: cine_trufi_cs_rt_short axis · sagittal · 8.0mm · 1.73mm/px · 1 of 49 slices shown (15 of 19)]
[im 1/49]
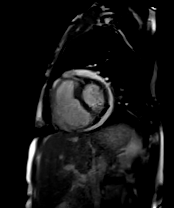

[Series 8: cine_trufi_cs_rt_short axis · sagittal · 8.0mm · 1.73mm/px · 1 of 49 slices shown (16 of 19)]
[im 1/49]
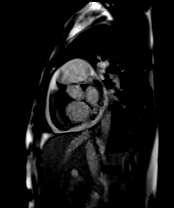

[Series 8: cine_trufi_cs_rt_short axis · sagittal · 8.0mm · 1.73mm/px · 1 of 49 slices shown (17 of 19)]
[im 1/49]
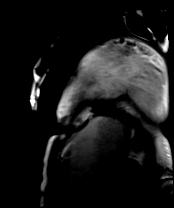

[Series 8: cine_trufi_cs_rt_short axis · sagittal · 8.0mm · 1.73mm/px · 1 of 49 slices shown (18 of 19)]
[im 1/49]
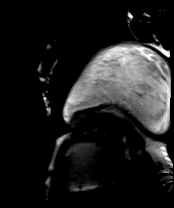

[Series 8: cine_trufi_cs_rt_short axis · sagittal · 8.0mm · 1.73mm/px · 1 of 49 slices shown (19 of 19)]
[im 1/49]
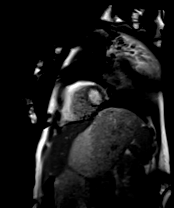

[Series 9: STIR · sagittal · 8.0mm · 1.73mm/px · 1 of 18 slices shown]
[im 1/18]
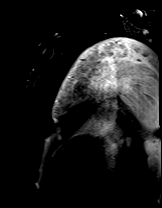

[Series 10: cine_trufi_cs_rt_radial · axial · 6.0mm · 1.73mm/px · 1 of 43 slices shown (1 of 3)]
[im 1/43]
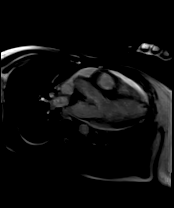

[Series 11: cine_trufi_cs_rt_radial · oblique · 6.0mm · 1.73mm/px · 1 of 43 slices shown (2 of 3)]
[im 1/43]
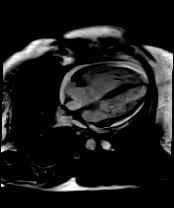

[Series 12: cine_trufi_cs_rt_radial · coronal · 6.0mm · 1.73mm/px · 1 of 43 slices shown (3 of 3)]
[im 1/43]
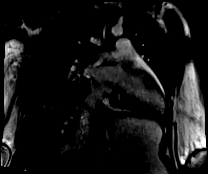

[Series 13: (id)_long_t1 · sagittal · 8.0mm · 1.41mm/px · 1 of 24 slices shown]
[im 1/24]
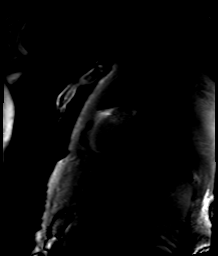

[Series 14: (id)_long_t1_moco · sagittal · 8.0mm · 1.41mm/px · 1 of 24 slices shown]
[im 1/24]
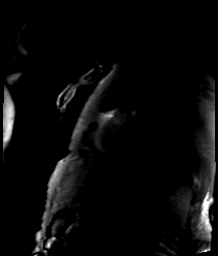

[Series 17: (id)_trufi · sagittal · 8.0mm · 1.88mm/px · 1 of 9 slices shown]
[im 1/9]
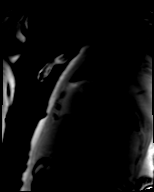

[Series 18: (id)_trufi_moco · sagittal · 8.0mm · 1.88mm/px · 1 of 9 slices shown]
[im 1/9]
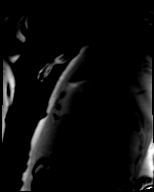

[Series 22: bSSFP · axial · 6.0mm · 1.48mm/px · 1 of 25 slices shown (13 of 15)]
[im 1/25]
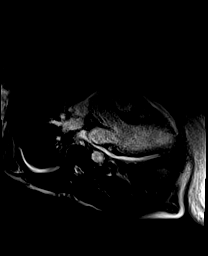

[Series 23: bSSFP · oblique · 6.0mm · 1.48mm/px · 1 of 25 slices shown (14 of 15)]
[im 1/25]
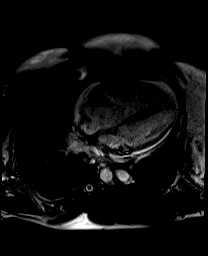

[Series 24: bSSFP · coronal · 6.0mm · 1.48mm/px · 1 of 25 slices shown (15 of 15)]
[im 1/25]
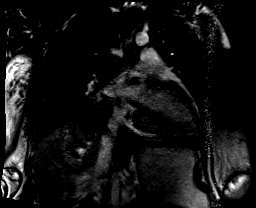

[Series 26: lge_single shot sa · sagittal · 8.0mm · 1.98mm/px · 1 of 19 slices shown (1 of 2)]
[im 1/19]
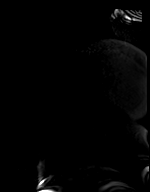

[Series 27: lge_single shot sa · sagittal · 8.0mm · 1.98mm/px · 1 of 19 slices shown (2 of 2)]
[im 1/19]
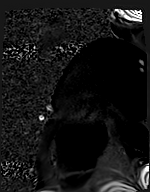

[Series 28: lge_single shot radial_mag · axial · 6.0mm · 1.98mm/px · 1 of 1 slices shown]
[im 1/1]
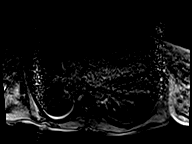

[45 of 48 positions shown; findings below may reference images not displayed]

FINDINGS: 1. Small underfilling left ventricle with normal thickness and
hyperdynamic systolic function (LVEF = 66%). There are no regional
wall motion abnormalities. D-shaped interventricular septum in
systole and diastole is consistent with RV pressure and volume
overload. There is no late gadolinium enhancement in the left
ventricular myocardium.

LVEDD: 42 mm

LVESD: 25 mm

LVEDV: 74 ml

LVESV: 25 ml

SV: 49 ml

CO: 5.1 L/min

Myocardial mass: 98 g

2. Severely dilated right ventricular size, with moderate right
ventricular hypertrophy and severely decreased systolic function
(LVEF = 24%). There are no regional wall motion abnormalities.

3.  Normal left atrial size.  Moderately dilated right atrium.

4. Normal size of the aortic root, ascending aorta. Severely dilated
pulmonary artery measuring 45 mm consistent with pulmonary
hypertension.

5.  Trivial mitral and moderate tricuspid regurgitation.

6. Normal pericardium. Mild to moderate circumferential pericardial
effusion with maximum diameter 12 mm. Dilated IVC measuring 23 mm
and no collapse.
IMPRESSION: Study interpretation affected by patient's inability to hold her
breath and motion during the exam.

1. Small underfilling left ventricle with normal thickness and
hyperdynamic systolic function (LVEF = 66%). There are no regional
wall motion abnormalities. D-shaped interventricular septum in
systole and diastole is consistent with RV pressure and volume
overload. There is no late gadolinium enhancement in the left
ventricular myocardium.

2. Severely dilated right ventricular size, with moderate right
ventricular hypertrophy and severely decreased systolic function
(LVEF = 24%). There are no regional wall motion abnormalities.

3.  Normal left atrial size.  Moderately dilated right atrium.

4. Normal size of the aortic root, ascending aorta. Severely dilated
pulmonary artery measuring 45 mm consistent with pulmonary
hypertension.

5.  Trivial mitral and moderate tricuspid regurgitation.

6. Normal pericardium. Mild to moderate circumferential pericardial
effusion with maximum diameter 12 mm. Dilated IVC measuring 23 mm
and no collapse.

These findings are consistent with isolated severe right ventricular
failure leading to left sided underfilling and moderate
circumferential pericardial effusion.

## 2022-02-22 ENCOUNTER — Emergency Department (HOSPITAL_COMMUNITY): Payer: Medicaid Other

## 2022-02-22 ENCOUNTER — Other Ambulatory Visit: Payer: Self-pay

## 2022-02-22 ENCOUNTER — Emergency Department (HOSPITAL_COMMUNITY)
Admission: EM | Admit: 2022-02-22 | Discharge: 2022-02-22 | Disposition: A | Discharge status: Home / Self Care | Payer: Medicaid Other | Attending: Emergency Medicine | Admitting: Emergency Medicine

## 2022-02-22 ENCOUNTER — Encounter (HOSPITAL_COMMUNITY): Payer: Self-pay

## 2022-02-22 DIAGNOSIS — R519 Headache, unspecified: Secondary | ICD-10-CM | POA: Diagnosis present

## 2022-02-22 DIAGNOSIS — Z79899 Other long term (current) drug therapy: Secondary | ICD-10-CM | POA: Insufficient documentation

## 2022-02-22 DIAGNOSIS — I110 Hypertensive heart disease with heart failure: Secondary | ICD-10-CM | POA: Insufficient documentation

## 2022-02-22 DIAGNOSIS — Z87891 Personal history of nicotine dependence: Secondary | ICD-10-CM | POA: Diagnosis not present

## 2022-02-22 DIAGNOSIS — G44009 Cluster headache syndrome, unspecified, not intractable: Secondary | ICD-10-CM | POA: Diagnosis not present

## 2022-02-22 DIAGNOSIS — I509 Heart failure, unspecified: Secondary | ICD-10-CM | POA: Diagnosis not present

## 2022-02-22 LAB — BASIC METABOLIC PANEL
Anion gap: 8 (ref 5–15)
BUN: 8 mg/dL (ref 6–20)
CO2: 25 mmol/L (ref 22–32)
Calcium: 8.7 mg/dL — ABNORMAL LOW (ref 8.9–10.3)
Chloride: 102 mmol/L (ref 98–111)
Creatinine, Ser: 0.76 mg/dL (ref 0.44–1.00)
GFR, Estimated: >60 mL/min (ref >60)
Glucose, Bld: 154 mg/dL — ABNORMAL HIGH (ref 70–99)
Potassium: 3.3 mmol/L — ABNORMAL LOW (ref 3.5–5.1)
Sodium: 135 mmol/L (ref 135–145)

## 2022-02-22 LAB — CBC WITH DIFFERENTIAL/PLATELET
Abs Immature Granulocytes: 0.03 10*3/uL (ref 0.00–0.07)
Basophils Absolute: 0 10*3/uL (ref 0.0–0.1)
Basophils Relative: 0 %
Eosinophils Absolute: 0 10*3/uL (ref 0.0–0.5)
Eosinophils Relative: 0 %
HCT: 44.1 % (ref 36.0–46.0)
Hemoglobin: 14.3 g/dL (ref 12.0–15.0)
Immature Granulocytes: 0 %
Lymphocytes Relative: 16 %
Lymphs Abs: 1.8 10*3/uL (ref 0.7–4.0)
MCH: 27.9 pg (ref 26.0–34.0)
MCHC: 32.4 g/dL (ref 30.0–36.0)
MCV: 86 fL (ref 80.0–100.0)
Monocytes Absolute: 0.4 10*3/uL (ref 0.1–1.0)
Monocytes Relative: 3 %
Neutro Abs: 8.8 10*3/uL — ABNORMAL HIGH (ref 1.7–7.7)
Neutrophils Relative %: 81 %
Platelets: 232 10*3/uL (ref 150–400)
RBC: 5.13 MIL/uL — ABNORMAL HIGH (ref 3.87–5.11)
RDW: 15.4 % (ref 11.5–15.5)
WBC: 11 10*3/uL — ABNORMAL HIGH (ref 4.0–10.5)
nRBC: 0 % (ref 0.0–0.2)

## 2022-02-22 LAB — SEDIMENTATION RATE: Sed Rate: 3 mm/hr (ref 0–22)

## 2022-02-22 LAB — MAGNESIUM: Magnesium: 2 mg/dL (ref 1.7–2.4)

## 2022-02-22 MED ORDER — IOHEXOL 350 MG/ML SOLN
75.0000 mL | Freq: Once | INTRAVENOUS | Status: AC | PRN
  Administered 2022-02-22: 75 mL via INTRAVENOUS

## 2022-02-22 MED ORDER — DIPHENHYDRAMINE HCL 50 MG/ML IJ SOLN
25.0000 mg | Freq: Once | INTRAMUSCULAR | Status: AC
Start: 2022-02-22 — End: 2022-02-22
  Administered 2022-02-22: 25 mg via INTRAVENOUS
  Filled 2022-02-22: qty 1

## 2022-02-22 MED ORDER — DEXAMETHASONE SODIUM PHOSPHATE 10 MG/ML IJ SOLN
10.0000 mg | Freq: Once | INTRAMUSCULAR | Status: AC
  Administered 2022-02-22: 10 mg
  Filled 2022-02-22: qty 1

## 2022-02-22 MED ORDER — AMOXICILLIN-POT CLAVULANATE 875-125 MG PO TABS
1.0000 | ORAL_TABLET | Freq: Two times a day (BID) | ORAL | 0 refills | Status: DC
Start: 2022-02-22 — End: 2022-06-15
  Filled 2022-02-22 – 2022-02-23 (×3): qty 14, 7d supply, fill #0

## 2022-02-22 MED ORDER — LIDOCAINE 5 % EX PTCH
1.0000 | MEDICATED_PATCH | CUTANEOUS | Status: DC
  Filled 2022-02-22: qty 1

## 2022-02-22 MED ORDER — KETOROLAC TROMETHAMINE 15 MG/ML IJ SOLN
15.0000 mg | Freq: Once | INTRAMUSCULAR | Status: AC
  Administered 2022-02-22: 15 mg via INTRAVENOUS
  Filled 2022-02-22: qty 1

## 2022-02-22 MED ORDER — SODIUM CHLORIDE (PF) 0.9 % IJ SOLN
INTRAMUSCULAR | Status: AC
  Administered 2022-02-22: 10 mL
  Filled 2022-02-22: qty 50

## 2022-02-22 MED ORDER — BUTALBITAL-APAP-CAFFEINE 50-325-40 MG PO TABS
1.0000 | ORAL_TABLET | Freq: Four times a day (QID) | ORAL | 0 refills | Status: DC | PRN
  Filled 2022-02-22: qty 20, 5d supply, fill #0

## 2022-02-22 MED ORDER — ACETAMINOPHEN 325 MG PO TABS
650.0000 mg | ORAL_TABLET | Freq: Once | ORAL | Status: AC
  Administered 2022-02-22: 650 mg via ORAL
  Filled 2022-02-22: qty 2

## 2022-02-22 MED ORDER — METOCLOPRAMIDE HCL 5 MG/ML IJ SOLN
5.0000 mg | Freq: Once | INTRAMUSCULAR | Status: AC
  Administered 2022-02-22: 5 mg via INTRAVENOUS
  Filled 2022-02-22: qty 2

## 2022-02-22 MED ORDER — MAGNESIUM SULFATE 2 GM/50ML IV SOLN
2.0000 g | Freq: Once | INTRAVENOUS | Status: AC
  Administered 2022-02-22: 2 g via INTRAVENOUS
  Filled 2022-02-22: qty 50

## 2022-02-22 MED ORDER — SODIUM CHLORIDE 0.9 % IV BOLUS
1000.0000 mL | Freq: Once | INTRAVENOUS | Status: AC
  Administered 2022-02-22: 1000 mL via INTRAVENOUS

## 2022-02-22 NOTE — ED Triage Notes (Signed)
EMS reports from home, c/o headaches and light sensitivity x 1 week. Pt on 6ltrs O2 24-7 ? ?BP 110/62 ?HR 80 ?RR 16 ?Sp02 87 on 6ltrs. ? ?

## 2022-02-22 NOTE — Discharge Instructions (Addendum)
It was a pleasure caring for you today in the emergency department. ° °Please return to the emergency department for any worsening or worrisome symptoms. ° ° °

## 2022-02-22 NOTE — ED Notes (Addendum)
Changed brief (urine in brief and fully saturated), applied purewick with pericare, and provided new blankets to pt. ?

## 2022-02-22 NOTE — ED Provider Notes (Signed)
4:29 PM ?Patient signed out to me by previous ED physician.  Patient is a 56 year old female presenting for headache x1 week.  ? ?Ending laboratory studies and CTA. ? ?Physical Exam  ?BP 103/66   Pulse 73   Temp 98 ?F (36.7 ?C) (Oral)   Resp (!) 22   Ht 5\' 2"  (1.575 m)   Wt 63.5 kg   SpO2 99%   BMI 25.61 kg/m?  ? ?Physical Exam ?Vitals and nursing note reviewed.  ?Constitutional:   ?   General: She is not in acute distress. ?   Appearance: She is well-developed.  ?HENT:  ?   Head: Normocephalic and atraumatic.  ?Eyes:  ?   Conjunctiva/sclera: Conjunctivae normal.  ?Cardiovascular:  ?   Rate and Rhythm: Normal rate and regular rhythm.  ?   Heart sounds: No murmur heard. ?Pulmonary:  ?   Effort: Pulmonary effort is normal.  ?Musculoskeletal:     ?   General: No swelling.  ?   Cervical back: Neck supple.  ?Skin: ?   General: Skin is warm and dry.  ?   Capillary Refill: Capillary refill takes less than 2 seconds.  ?Neurological:  ?   Mental Status: She is alert.  ?   GCS: GCS eye subscore is 4. GCS verbal subscore is 5. GCS motor subscore is 6.  ?Psychiatric:     ?   Mood and Affect: Mood normal.  ? ? ?Procedures  ?Procedures ? ?ED Course / MDM  ?  ?Medical Decision Making ?Amount and/or Complexity of Data Reviewed ?Labs: ordered. ?Radiology: ordered. ? ?Risk ?OTC drugs. ?Prescription drug management. ? ? ?CT head angio demonstrates no acute vessel disease.  Laboratory studies demonstrate leukocytosis with neutrophilia.  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  Signs or symptoms of sepsis.  No mastoid tenderness on exam.  Due to patient's symptoms of a headache for the past week I think it is fair to treat with antibiotics and have her follow-up closely with her primary care physician.  Remains well, headache improved in ED, with no neurovascular deficits. ? ? ? ? ?  ? P, DO ?02/22/22 1640 ? ?

## 2022-02-22 NOTE — ED Provider Notes (Addendum)
[facility]-EMERGENCY DEPT Provider Note   CSN: [id number] Arrival date & time: 02/22/22  0758     History  Chief Complaint  Patient presents with   Headache    Michele Parrish is a 56 y.o. female.   Patient as above with significant medical history as below, including CHF, pulmonary hypertension who presents to the ED with complaint of headache.  Reports headache x1 week.  Was intermittent.  Over the last 24 hours has become more severe.  Right-sided, squeezing, throbbing.  Does report some blurry vision but does feel she has chronically blurry vision.  Thinks is mildly worse than typical. Pain around her right eye.  No nausea or vomiting.  She reports photophobia and phonophobia.  No trauma.  Worse headache than she had in the past.  She is on home oxygen, feels her breathing is at her baseline.     Past Medical History: 02/22/2020: Acute exacerbation of CHF (congestive heart failure) (HCC) No date: Bronchitis No date: Pericardial effusion No date: Pulmonary hypertension (HCC)  Past Surgical History: No date: ABDOMINAL HYSTERECTOMY 02/23/2020: RIGHT HEART CATH; N/A     Comment:  Procedure: RIGHT HEART CATH;  Surgeon: Iran Ouch, MD;  Location: [facility] INVAS[facility];  Service:               Cardiovascular;  Laterality: N/A;    The history is provided by the patient. No language interpreter was used.  Headache Associated symptoms: eye pain and photophobia   Associated symptoms: no abdominal pain, no cough, no fever, no nausea and no vomiting       Home Medications Prior to Admission medications   Medication Sig Start Date End Date Taking? Authorizing Provider  amoxicillin-clavulanate (AUGMENTIN) 875-125 MG tablet Take 1 tablet by mouth every 12 (twelve) hours. 02/22/22  Yes Franne Forts, DO  butalbital-acetaminophen-caffeine (FIORICET) [phone] MG tablet Take 1 tablet by mouth every 6 (six) hours as needed for headache. 02/22/22  02/22/23 Yes Tanda Rockers A, DO  albuterol (VENTOLIN HFA) 108 (90 Base) MCG/ACT inhaler Inhale 2 puffs into the lungs every 4 (four) hours as needed for wheezing or shortness of breath. Patient requests meds be mailed 11/11/21 12/11/21  Claiborne Rigg, NP  fluticasone-salmeterol (ADVAIR HFA) [phone] MCG/ACT inhaler Inhale 2 puffs into the lungs 2 (two) times daily. Patient requests meds be mailed 11/11/21 12/13/21  Claiborne Rigg, NP  furosemide (LASIX) 40 MG tablet Take 1 tablet (40 mg total) by mouth 2 (two) times daily. Patient requests meds be mailed 11/11/21 12/12/21  Claiborne Rigg, NP  gabapentin (NEURONTIN) 300 MG capsule Take 1 capsule (300 mg total) by mouth daily. Patient requests meds be mailed 11/11/21 01/29/22  Claiborne Rigg, NP  ipratropium-albuterol (DUONEB) 0.5-2.5 (3) MG/3ML SOLN Take 3 mLs by nebulization every 6 (six) hours as needed. Patient requests scripts be mailed 11/11/21 12/13/21  Claiborne Rigg, NP  nicotine (NICODERM CQ - DOSED IN MG/24 HOURS) 14 mg/24hr patch Place 1 patch (14 mg total) onto the skin daily. 11/11/21   Claiborne Rigg, NP  nicotine polacrilex (COMMIT) 4 MG lozenge Dissolve 1 lozenge (4 mg total) by mouth as needed for smoking cessation. 09/11/21 02/25/22  Zigmund Daniel., MD  oxyCODONE (ROXICODONE) 5 MG immediate release tablet Take 1 tablet by mouth every 8 hours as needed for up to 4 doses. 09/11/21   Sherlon Handing  Charlyn Minerva., MD  polyvinyl alcohol (LIQUIFILM TEARS) 1.4 % ophthalmic solution Place 1 drop into both eyes as needed for dry eyes. 09/11/21   Zigmund Daniel., MD  sertraline (ZOLOFT) 50 MG tablet Take 1 tablet (50 mg total) by mouth daily. Patient requests meds be mailed 01/23/22 02/25/22  Storm Frisk, MD  tiotropium (SPIRIVA) 18 MCG inhalation capsule Place 1 capsule (18 mcg total) into inhaler and inhale daily. Patient requests meds be mailed 11/11/21 12/12/21  Claiborne Rigg, NP      Allergies    Patient has no known allergies.     Review of Systems   Review of Systems  Constitutional:  Negative for chills and fever.  HENT:  Negative for facial swelling and trouble swallowing.   Eyes:  Positive for photophobia, pain and visual disturbance.  Respiratory:  Negative for cough and shortness of breath.   Cardiovascular:  Negative for chest pain and palpitations.  Gastrointestinal:  Negative for abdominal pain, nausea and vomiting.  Endocrine: Negative for polydipsia and polyuria.  Genitourinary:  Negative for difficulty urinating and hematuria.  Musculoskeletal:  Negative for gait problem and joint swelling.  Skin:  Negative for pallor and rash.  Neurological:  Positive for headaches. Negative for syncope.  Psychiatric/Behavioral:  Negative for agitation and confusion.    Physical Exam Updated Vital Signs BP 107/63   Pulse 69   Temp 98 F (36.7 C) (Oral)   Resp 20   Ht 5\' 2"  (1.575 m)   Wt 63.5 kg   SpO2 99%   BMI 25.61 kg/m  Physical Exam Vitals and nursing note reviewed.  Constitutional:      General: She is not in acute distress.    Appearance: Normal appearance. She is well-developed. She is not diaphoretic.  HENT:     Head: Normocephalic and atraumatic. No raccoon eyes, Battle's sign, right periorbital erythema or left periorbital erythema.      Right Ear: External ear normal.     Left Ear: External ear normal.     Nose: Nose normal.     Mouth/Throat:     Mouth: Mucous membranes are moist.  Eyes:     General: No scleral icterus.       Right eye: No discharge.        Left eye: No discharge.     Extraocular Movements: Extraocular movements intact.     Pupils: Pupils are equal, round, and reactive to light.     Comments: Tearing from right eye, pain to palpation around the eye / orbit  Cardiovascular:     Rate and Rhythm: Normal rate and regular rhythm.     Pulses: Normal pulses.     Heart sounds: Normal heart sounds.  Pulmonary:     Effort: Pulmonary effort is normal. No respiratory  distress.     Breath sounds: Normal breath sounds.  Abdominal:     General: Abdomen is flat.     Palpations: Abdomen is soft.     Tenderness: There is no abdominal tenderness.  Musculoskeletal:        General: Normal range of motion.     Cervical back: Normal range of motion. No rigidity.     Right lower leg: No edema.     Left lower leg: No edema.  Skin:    General: Skin is warm and dry.     Capillary Refill: Capillary refill takes less than 2 seconds.  Neurological:     Mental Status: She is alert and  oriented to person, place, and time.     GCS: GCS eye subscore is 4. GCS verbal subscore is 5. GCS motor subscore is 6.     Cranial Nerves: Cranial nerves 2-12 are intact. No dysarthria.     Sensory: Sensation is intact.     Motor: Motor function is intact.     Coordination: Coordination is intact.  Psychiatric:        Mood and Affect: Mood normal.        Behavior: Behavior normal.    ED Results / Procedures / Treatments   Labs (all labs ordered are listed, but only abnormal results are displayed) Labs Reviewed  CBC WITH DIFFERENTIAL/PLATELET - Abnormal; Notable for the following components:      Result Value   WBC 11.0 (*)    RBC 5.13 (*)    Neutro Abs 8.8 (*)    All other components within normal limits  BASIC METABOLIC PANEL - Abnormal; Notable for the following components:   Potassium 3.3 (*)    Glucose, Bld 154 (*)    Calcium 8.7 (*)    All other components within normal limits  SEDIMENTATION RATE  MAGNESIUM  C-REACTIVE PROTEIN    EKG None  Radiology CT Angio Head W or Wo Contrast  Result Date: 02/22/2022 CLINICAL DATA:  56 year old female sudden severe headache and photophobia. EXAM: CT ANGIOGRAPHY HEAD TECHNIQUE: Multidetector CT imaging of the head was performed using the standard protocol during bolus administration of intravenous contrast. Multiplanar CT image reconstructions and MIPs were obtained to evaluate the vascular anatomy. RADIATION DOSE  REDUCTION: This exam was performed according to the departmental dose-optimization program which includes automated exposure control, adjustment of the mA and/or kV according to patient size and/or use of iterative reconstruction technique. CONTRAST:  75mL OMNIPAQUE IOHEXOL 350 MG/ML SOLN COMPARISON:  Plain head CT 09/28/2010. FINDINGS: CT HEAD Brain: No midline shift, ventriculomegaly, mass effect, evidence of mass lesion, intracranial hemorrhage or evidence of cortically based acute infarction. Cerebral volume loss since 2011 seems to be age appropriate. Mild scattered cerebral white matter hypodensity, mostly subcortical, is stable. No cortical encephalomalacia identified. Calvarium and skull base: No acute osseous abnormality identified. Paranasal sinuses: Mild chronic right mastoid effusion. Visualized paranasal sinuses are clear. Orbits: Disconjugate gaze but otherwise negative orbit and scalp soft tissues. CTA HEAD Posterior circulation: Dominant appearing distal left vertebral artery. Both distal vertebral arteries are patent to the vertebrobasilar junction. The right is diminutive beyond the PICA origin. Both PICA origins are patent. Patent basilar artery without stenosis. Basilar tip, SCA and PCA origins appear normal. Small left posterior communicating artery, the right is diminutive or absent. Bilateral PCA branches are within normal limits. Anterior circulation: Asymmetric ICA siphon size appears to beyond the basis of dominant right and diminutive or absent left ACA A1 segments. Patent ICA siphons with no plaque or stenosis. Normal ophthalmic and left posterior communicating artery origins. Patent MCA and right ACA origins. Anterior communicating artery and bilateral ACA branches are within normal limits. Median artery of the corpus callosum (normal variant). Left MCA M1 segment and trifurcation are patent without stenosis. Right MCA M1 segment is tortuous. Right MCA bifurcation is patent without  stenosis. Bilateral MCA branches are within normal limits. Venous sinuses: Patent. Anatomic variants: Dominant left vertebral artery. Dominant right and diminutive or absent left ACA A1 segments. Review of the MIP images confirms the above findings IMPRESSION: 1. Negative for intracranial aneurysm or large vessel occlusion. 2. No acute intracranial abnormality. 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 3. Mild chronic right mastoid effusion. Electronically Signed   By: Odessa Fleming M.D.   On: 02/22/2022 10:17    Procedures Procedures    Medications Ordered in ED Medications  lidocaine (LIDODERM) 5 % 1 patch (1 patch Transdermal Not Given 02/22/22 0900)  magnesium sulfate IVPB 2 g 50 mL (0 g Intravenous Stopped 02/22/22 1358)  sodium chloride 0.9 % bolus 1,000 mL (0 mLs Intravenous Stopped 02/22/22 1358)  acetaminophen (TYLENOL) tablet 650 mg (650 mg Oral Given 02/22/22 0901)  metoCLOPramide (REGLAN) injection 5 mg (5 mg Intravenous Given 02/22/22 0900)  diphenhydrAMINE (BENADRYL) injection 25 mg (25 mg Intravenous Given 02/22/22 0900)  dexamethasone (DECADRON) injection 10 mg (10 mg Other Given 02/22/22 0900)  sodium chloride (PF) 0.9 % injection (10 mLs  Given 02/22/22 1400)  iohexol (OMNIPAQUE) 350 MG/ML injection 75 mL (75 mLs Intravenous Contrast Given 02/22/22 0943)  ketorolac (TORADOL) 15 MG/ML injection 15 mg (15 mg Intravenous Given 02/22/22 1359)    ED Course/ Medical Decision Making/ A&P                           Medical Decision Making Amount and/or Complexity of Data Reviewed Labs: ordered. Radiology: ordered.  Risk OTC drugs. Prescription drug management.    CC: Headache  This patient presents to the Emergency Department for the above complaint. This involves an extensive number of treatment options and is a complaint that carries with it a high risk of complications and morbidity. Vital signs were reviewed. Serious etiologies  considered.  Differential includes was not limited to migraine, atypical migraine, complex migraine, giant cell arteritis, SAH, cluster headache, tension headache.  Other acute intracranial pathologies.    Record review:  Previous records obtained and reviewed  Prior ED visit, prior admissions, prior labs and imaging.  Additional history obtained from n/a  Medical and surgical history as noted above.   Work up as above, notable for:  Labs & imaging results that were available during my care of the patient were visualized by me and considered in my medical decision making.   I ordered imaging studies which included CTA head and I visualized the imaging and I agree with radiologist interpretation.   Cardiac monitoring reviewed and interpreted personally which shows NSR  Labs reviewed and are stable  Giant cell arteritis was considered however given labs and history feel this is unlikely    Management: Give headache cocktail Decadron IVF  Reassessment:  Pt reports she is feeling much better. She feels she is breathing at her typical level (6L [place] 24/7 pulm htn). She does have exertional dyspnea but this is normal for her. Neurologically she is intact. Favor likely cluster HA as etiology for symptoms today. Recommend o/p neuro f/u. Fiorcet for home.   Tolerating PO  She has chronic blurry vision, feels that it is roughly unchanged over the last few months, reports she was supposed to have a surgery but "didn't get around to it." No diplopia  She is feeling much better, ambulatory w/ steady gait  Pt signed out to incoming EDP pending re-assess and dispo. Anticipate discharge.                  Social determinants of health include -  Social History   Socioeconomic History   Marital status: Single    Spouse name: Not on file   Number of children: Not on file   Years of  education: Not on file   Highest education level: Not on file  Occupational History   Not  on file  Tobacco Use   Smoking status: Former    Packs/day: 1.50    Years: 42.00    Pack years: 63.00    Types: Cigarettes    Quit date: 02/18/2020    Years since quitting: 2.0   Smokeless tobacco: Never  Vaping Use   Vaping Use: Never used  Substance and Sexual Activity   Alcohol use: Yes    Comment: 2 beers per day   Drug use: No   Sexual activity: Not on file  Other Topics Concern   Not on file  Social History Narrative   Not on file   Social Determinants of Health   Financial Resource Strain: Not on file  Food Insecurity: Not on file  Transportation Needs: Not on file  Physical Activity: Not on file  Stress: Not on file  Social Connections: Not on file  Intimate Partner Violence: Not on file      This chart was dictated using voice recognition software.  Despite best efforts to proofread,  errors can occur which can change the documentation meaning.         Final Clinical Impression(s) / ED Diagnoses Final diagnoses:  Cluster headache, not intractable, unspecified chronicity pattern    Rx / DC Orders ED Discharge Orders          Ordered    butalbital-acetaminophen-caffeine (FIORICET) 50-325-40 MG tablet  Every 6 hours PRN        02/22/22 1618    amoxicillin-clavulanate (AUGMENTIN) 875-125 MG tablet  Every 12 hours        02/22/22 1630              Sloan Leiter, DO 02/22/22 1826    Sloan Leiter, DO 02/22/22 1827

## 2022-02-23 ENCOUNTER — Other Ambulatory Visit (HOSPITAL_COMMUNITY): Payer: Self-pay

## 2022-02-23 ENCOUNTER — Other Ambulatory Visit: Payer: Self-pay

## 2022-02-25 ENCOUNTER — Other Ambulatory Visit (HOSPITAL_COMMUNITY): Payer: Self-pay

## 2022-02-25 ENCOUNTER — Other Ambulatory Visit: Payer: Self-pay

## 2022-02-26 ENCOUNTER — Other Ambulatory Visit: Payer: Self-pay

## 2022-02-26 ENCOUNTER — Other Ambulatory Visit (HOSPITAL_COMMUNITY): Payer: Self-pay

## 2022-02-27 ENCOUNTER — Other Ambulatory Visit (HOSPITAL_COMMUNITY): Payer: Self-pay

## 2022-03-01 ENCOUNTER — Encounter (HOSPITAL_COMMUNITY): Payer: Self-pay | Admitting: Emergency Medicine

## 2022-03-01 ENCOUNTER — Emergency Department (HOSPITAL_COMMUNITY)
Admission: EM | Admit: 2022-03-01 | Discharge: 2022-03-01 | Disposition: A | Discharge status: Home / Self Care | Payer: Medicaid Other | Attending: Emergency Medicine | Admitting: Emergency Medicine

## 2022-03-01 DIAGNOSIS — Z79899 Other long term (current) drug therapy: Secondary | ICD-10-CM | POA: Insufficient documentation

## 2022-03-01 DIAGNOSIS — N189 Chronic kidney disease, unspecified: Secondary | ICD-10-CM | POA: Insufficient documentation

## 2022-03-01 DIAGNOSIS — I130 Hypertensive heart and chronic kidney disease with heart failure and stage 1 through stage 4 chronic kidney disease, or unspecified chronic kidney disease: Secondary | ICD-10-CM | POA: Insufficient documentation

## 2022-03-01 DIAGNOSIS — I509 Heart failure, unspecified: Secondary | ICD-10-CM | POA: Diagnosis not present

## 2022-03-01 DIAGNOSIS — R519 Headache, unspecified: Secondary | ICD-10-CM | POA: Diagnosis present

## 2022-03-01 DIAGNOSIS — G44019 Episodic cluster headache, not intractable: Secondary | ICD-10-CM

## 2022-03-01 DIAGNOSIS — J449 Chronic obstructive pulmonary disease, unspecified: Secondary | ICD-10-CM | POA: Insufficient documentation

## 2022-03-01 MED ORDER — DEXAMETHASONE SODIUM PHOSPHATE 10 MG/ML IJ SOLN
10.0000 mg | Freq: Once | INTRAMUSCULAR | Status: AC
  Administered 2022-03-01: 10 mg via INTRAVENOUS
  Filled 2022-03-01: qty 1

## 2022-03-01 MED ORDER — METOCLOPRAMIDE HCL 5 MG/ML IJ SOLN
10.0000 mg | Freq: Once | INTRAMUSCULAR | Status: AC
  Administered 2022-03-01: 10 mg via INTRAVENOUS
  Filled 2022-03-01: qty 2

## 2022-03-01 MED ORDER — KETOROLAC TROMETHAMINE 15 MG/ML IJ SOLN
15.0000 mg | Freq: Once | INTRAMUSCULAR | Status: AC
  Administered 2022-03-01: 15 mg via INTRAVENOUS
  Filled 2022-03-01: qty 1

## 2022-03-01 MED ORDER — DIPHENHYDRAMINE HCL 50 MG/ML IJ SOLN
12.5000 mg | Freq: Once | INTRAMUSCULAR | Status: AC
  Administered 2022-03-01: 12.5 mg via INTRAVENOUS
  Filled 2022-03-01: qty 1

## 2022-03-01 MED ORDER — LIDOCAINE HCL (PF) 1 % IJ SOLN
2.0000 mL | Freq: Once | INTRAMUSCULAR | Status: AC
  Administered 2022-03-01: 2 mL
  Filled 2022-03-01: qty 30

## 2022-03-01 MED ORDER — TETRACAINE HCL 0.5 % OP SOLN
2.0000 [drp] | Freq: Once | OPHTHALMIC | Status: AC
  Administered 2022-03-01: 2 [drp] via OPHTHALMIC
  Filled 2022-03-01: qty 4

## 2022-03-01 NOTE — ED Triage Notes (Addendum)
Pt has right eye pain and redness. Was treated for it little bit ago. Pain meds called in but not picked up. Almost completed amoxicillin. Hx CHF and on 5 L [place]  ?

## 2022-03-01 NOTE — ED Notes (Signed)
Pt's lid is mildly swollen and reddened.  ?

## 2022-03-01 NOTE — ED Notes (Signed)
Ptar called 

## 2022-03-01 NOTE — Discharge Instructions (Signed)
Take medications as previously prescribed. ?Follow up with ophthalmology, call to schedule an appointment.  ?

## 2022-03-01 NOTE — ED Provider Notes (Signed)
?[facility]-EMERG[facility]Y DEPT ?Provider Note ? ? ?CSN: [id number] ?Arrival date & time: 03/01/22  0407 ? ?  ? ?History ? ?Chief Complaint  ?Patient presents with  ? Eye Problem  ? ? ?Michele Parrish is a 56 y.o. female. ? ?56 year old female brought in by EMS for right frontal headache with eye pain, photophobia and redness. Reports onset of headache gradual at midnight, did not have anything to take for pain so called 911 to come to the ER. Seen in this ER with same headache 02/22/22, given Augmentin for chronic right mastoid effusion, also Fioricet but rx comes by mail and hasn't been able to get this one. History of migraines, CHF, pulmonary hypertension, on 5L [place] at baseline. Reports multiple similar headaches over the past few months, has very limited access to her providers due to her poor health and supplemental oxygen use.  ? ? ?  ? ?Home Medications ?Prior to Admission medications   ?Medication Sig Start Date End Date Taking? Authorizing Provider  ?albuterol (VENTOLIN HFA) 108 (90 Base) MCG/ACT inhaler Inhale 2 puffs into the lungs every 4 (four) hours as needed for wheezing or shortness of breath. Patient requests meds be mailed 11/11/21 12/11/21  Claiborne Rigg, NP  ?amoxicillin-clavulanate (AUGMENTIN) 875-125 MG tablet Take 1 tablet by mouth every 12 (twelve) hours. 02/22/22   Franne Forts, DO  ?butalbital-acetaminophen-caffeine (FIORICET) [phone] MG tablet Take 1 tablet by mouth every 6 (six) hours as needed for headache. 02/22/22 02/22/23  Sloan Leiter, DO  ?fluticasone-salmeterol (ADVAIR HFA) 230-21 MCG/ACT inhaler Inhale 2 puffs into the lungs 2 (two) times daily. Patient requests meds be mailed 11/11/21 03/29/22  Claiborne Rigg, NP  ?furosemide (LASIX) 40 MG tablet Take 1 tablet (40 mg total) by mouth 2 (two) times daily. Patient requests meds be mailed 11/11/21 12/12/21  Claiborne Rigg, NP  ?gabapentin (NEURONTIN) 300 MG capsule Take 1 capsule (300 mg total) by mouth daily. Patient  requests meds be mailed 11/11/21 01/29/22  Claiborne Rigg, NP  ?ipratropium-albuterol (DUONEB) 0.5-2.5 (3) MG/3ML SOLN Take 3 mLs by nebulization every 6 (six) hours as needed. Patient requests scripts be mailed 11/11/21 12/13/21  Claiborne Rigg, NP  ?nicotine (NICODERM CQ - DOSED IN MG/24 HOURS) 14 mg/24hr patch Place 1 patch (14 mg total) onto the skin daily. 11/11/21   Claiborne Rigg, NP  ?oxyCODONE (ROXICODONE) 5 MG immediate release tablet Take 1 tablet by mouth every 8 hours as needed for up to 4 doses. 09/11/21   Zigmund Daniel., MD  ?polyvinyl alcohol (LIQUIFILM TEARS) 1.4 % ophthalmic solution Place 1 drop into both eyes as needed for dry eyes. 09/11/21   Zigmund Daniel., MD  ?sertraline (ZOLOFT) 50 MG tablet Take 1 tablet (50 mg total) by mouth daily. Patient requests meds be mailed 01/23/22 02/25/22  Storm Frisk, MD  ?tiotropium (SPIRIVA) 18 MCG inhalation capsule Place 1 capsule (18 mcg total) into inhaler and inhale daily. Patient requests meds be mailed 11/11/21 03/29/22  Claiborne Rigg, NP  ?   ? ?Allergies    ?Patient has no known allergies.   ? ?Review of Systems   ?Review of Systems ?Negative except as per HPI ?Physical Exam ?Updated Vital Signs ?BP 125/90   Pulse 78   Temp 98.6 ?F (37 ?C) (Oral)   Resp 20   Ht 5\' 2"  (1.575 m)   Wt 63.5 kg   SpO2 100%   BMI 25.61 kg/m?  ?  Physical Exam ?Vitals and nursing note reviewed.  ?Constitutional:   ?   General: She is not in acute distress. ?   Appearance: She is well-developed. She is not diaphoretic.  ?   Comments: Appears uncomfortable  ?HENT:  ?   Head: Normocephalic and atraumatic.  ?   Nose: Nose normal.  ?Eyes:  ?   General: Lids are normal.     ?   Right eye: Discharge present.  ?   Extraocular Movements: Extraocular movements intact.  ?   Conjunctiva/sclera:  ?   Right eye: Right conjunctiva is injected.  ?   Pupils: Pupils are equal, round, and reactive to light.  ?   Slit lamp exam: ?   Right eye: Anterior chamber quiet.  Photophobia present.  ?   Left eye: Photophobia present.  ?   Comments: Watery right eye drainage  ?Pulmonary:  ?   Effort: Pulmonary effort is normal.  ?Musculoskeletal:  ?   Cervical back: Neck supple. No tenderness.  ?Skin: ?   General: Skin is warm and dry.  ?   Findings: No erythema or rash.  ?Neurological:  ?   Mental Status: She is alert and oriented to person, place, and time.  ?   Cranial Nerves: No cranial nerve deficit.  ?   Sensory: No sensory deficit.  ?Psychiatric:     ?   Behavior: Behavior normal.  ? ? ?ED Results / Procedures / Treatments   ?Labs ?(all labs ordered are listed, but only abnormal results are displayed) ?Labs Reviewed - No data to display ? ?EKG ?None ? ?Radiology ?No results found. ? ?Procedures ?Procedures  ? ? ?Medications Ordered in ED ?Medications  ?tetracaine (PONTOCAINE) 0.5 % ophthalmic solution 2 drop (2 drops Both Eyes Given 03/01/22 0517)  ?lidocaine (PF) (XYLOCAINE) 1 % injection 2 mL (2 mLs Other Given by Other 03/01/22 0517)  ?metoCLOPramide (REGLAN) injection 10 mg (10 mg Intravenous Given 03/01/22 0516)  ?dexamethasone (DECADRON) injection 10 mg (10 mg Intravenous Given 03/01/22 0516)  ?diphenhydrAMINE (BENADRYL) injection 12.5 mg (12.5 mg Intravenous Given 03/01/22 0515)  ?ketorolac (TORADOL) 15 MG/ML injection 15 mg (15 mg Intravenous Given 03/01/22 0516)  ? ? ?ED Course/ Medical Decision Making/ A&P ?  ?                        ?Medical Decision Making ? ?This patient presents to the ED for concern of right frontal headache with photophobia, right eye redness, this involves an extensive number of treatment options, and is a complaint that carries with it a high risk of complications and morbidity.  The differential diagnosis includes but not limited to cluster headache, glaucoma, migraine ? ? ?Co morbidities that complicate the patient evaluation ? ?Supplemental O2 at 4-5L [place] for COPD, pulmonary hypertension, CKD, CHF ? ? ?Additional history obtained: ? ?External records from  outside source obtained and reviewed including CTA head report from 02/22/22, no acute abnormality  ? ? ?Cardiac Monitoring: / EKG: ? ?The patient was maintained on a cardiac monitor.  I personally viewed and interpreted the cardiac monitored which showed an underlying rhythm of: sinus rhythm ? ? ?Problem List / ED Course / Critical interventions / Medication management ? ?56 year old female with chronic health complications, on home O2 brought in by EMS for return of headache, last seen here 1 week ago for same with reassuring work up. Pain right periorbital with eye redness and watering eye. No temporal tenderness, doubt  temporal arteritis. Attempted IOP assessment but patient does not tolerate exam. Minimal relief with spenopalentine block, given migraine cocktil with relief. Discharged with referral to ophthalmology and recommend recheck with PCP.  ?I ordered medication including lidocaine  for sphenopalatine block, provided partial relief. Followed with headache cocktail.   ?Reevaluation of the patient after these medicines showed that the patient resolved ?I have reviewed the patients home medicines and have made adjustments as needed ? ? ?Social Determinants of Health: ? ?Difficulty obtaining follow up care and rx due to poor health ? ? ? ? ? ? ? ?Final Clinical Impression(s) / ED Diagnoses ?Final diagnoses:  ?Episodic cluster headache, not intractable  ? ? ?Rx / DC Orders ?ED Discharge Orders   ? ? None  ? ?  ? ? ?  ?Jeannie Fend, PA-C ?03/01/22 0556 ? ?  ?Dione Booze, MD ?03/01/22 5462 ? ?

## 2022-06-08 ENCOUNTER — Telehealth: Payer: Self-pay | Admitting: Critical Care Medicine

## 2022-06-08 NOTE — Telephone Encounter (Signed)
I was given a form to fill out for incontinence supplies but it appears the requesting provider is not me and someone else in [place] I am confused I have not seen this patient since January can you investigate I do not feel comfortable filling out the form as I am not seeing the patient and I am not the date on her incontinence issues

## 2022-06-08 NOTE — Telephone Encounter (Signed)
Dr Delford Field - I called her and scheduled her to see you next Mon, 8/21 @1030 .  She understands that she needs to see you in person, she just gets anxious when she has to leave the house.  She has portable O2 and can bring her tank with her. I told her she can use O2 from this office when she is here. She has been using it @ 5L during the day and 6L at night. She also said that her vision is poor and she has " a lot going on." Transportation is a concern and I told her that her insurance company will provide transportation to medical appointments. I text her the phone number for [facility] transportation as she requested- [phone]. She can call this clinic if she is in need of transportation and has not been able to schedule through [facility].

## 2022-06-14 NOTE — Progress Notes (Signed)
   Established Patient Office Visit  Subjective   Patient ID: SARIYA TRICKEY, female    DOB: [birth year]  Age: 56 y.o. MRN: [id number]  No chief complaint on file.   56 y.o.F not seen since 2021  Pulm HTN Copd Gold D, Pulm nodules, chronic resp failure hypoxic, incontinence urine   {History (Optional):23778}  ROS    Objective:     There were no vitals taken for this visit. {Vitals History (Optional):23777}  Physical Exam   No results found for any visits on 06/15/22.  {Labs (Optional):23779}  The ASCVD Risk score (Arnett DK, et al., 2019) failed to calculate for the following reasons:   The valid total cholesterol range is 130 to 320 mg/dL    Assessment & Plan:   Problem List Items Addressed This Visit   None   No follow-ups on file.    Shan Levans, MD

## 2022-06-15 ENCOUNTER — Ambulatory Visit: Payer: Medicaid Other | Attending: Critical Care Medicine | Admitting: Critical Care Medicine

## 2022-06-15 ENCOUNTER — Other Ambulatory Visit (HOSPITAL_COMMUNITY): Payer: Self-pay

## 2022-06-15 ENCOUNTER — Encounter: Payer: Self-pay | Admitting: Critical Care Medicine

## 2022-06-15 ENCOUNTER — Telehealth: Payer: Self-pay | Admitting: Emergency Medicine

## 2022-06-15 ENCOUNTER — Telehealth: Payer: Self-pay

## 2022-06-15 ENCOUNTER — Other Ambulatory Visit: Payer: Self-pay

## 2022-06-15 ENCOUNTER — Ambulatory Visit (HOSPITAL_COMMUNITY)
Admission: EM | Admit: 2022-06-15 | Discharge: 2022-06-15 | Disposition: A | Discharge status: Home / Self Care | Payer: Medicaid Other | Attending: Family | Admitting: Family

## 2022-06-15 ENCOUNTER — Ambulatory Visit: Payer: Self-pay

## 2022-06-15 VITALS — BP 125/82 | HR 64 | Ht 62.0 in | Wt 132.4 lb

## 2022-06-15 DIAGNOSIS — F321 Major depressive disorder, single episode, moderate: Secondary | ICD-10-CM

## 2022-06-15 DIAGNOSIS — J180 Bronchopneumonia, unspecified organism: Secondary | ICD-10-CM

## 2022-06-15 DIAGNOSIS — H539 Unspecified visual disturbance: Secondary | ICD-10-CM | POA: Insufficient documentation

## 2022-06-15 DIAGNOSIS — R45851 Suicidal ideations: Secondary | ICD-10-CM | POA: Insufficient documentation

## 2022-06-15 DIAGNOSIS — J449 Chronic obstructive pulmonary disease, unspecified: Secondary | ICD-10-CM | POA: Diagnosis not present

## 2022-06-15 DIAGNOSIS — N182 Chronic kidney disease, stage 2 (mild): Secondary | ICD-10-CM

## 2022-06-15 DIAGNOSIS — I2782 Chronic pulmonary embolism: Secondary | ICD-10-CM | POA: Diagnosis not present

## 2022-06-15 DIAGNOSIS — Z9981 Dependence on supplemental oxygen: Secondary | ICD-10-CM | POA: Insufficient documentation

## 2022-06-15 DIAGNOSIS — I5032 Chronic diastolic (congestive) heart failure: Secondary | ICD-10-CM | POA: Diagnosis not present

## 2022-06-15 DIAGNOSIS — J9611 Chronic respiratory failure with hypoxia: Secondary | ICD-10-CM

## 2022-06-15 DIAGNOSIS — H1033 Unspecified acute conjunctivitis, bilateral: Secondary | ICD-10-CM

## 2022-06-15 DIAGNOSIS — Z87891 Personal history of nicotine dependence: Secondary | ICD-10-CM

## 2022-06-15 DIAGNOSIS — Z1329 Encounter for screening for other suspected endocrine disorder: Secondary | ICD-10-CM

## 2022-06-15 DIAGNOSIS — I272 Pulmonary hypertension, unspecified: Secondary | ICD-10-CM

## 2022-06-15 DIAGNOSIS — N393 Stress incontinence (female) (male): Secondary | ICD-10-CM

## 2022-06-15 DIAGNOSIS — I2609 Other pulmonary embolism with acute cor pulmonale: Secondary | ICD-10-CM | POA: Insufficient documentation

## 2022-06-15 DIAGNOSIS — F329 Major depressive disorder, single episode, unspecified: Secondary | ICD-10-CM | POA: Insufficient documentation

## 2022-06-15 HISTORY — DX: Suicidal ideations: R45.851

## 2022-06-15 MED ORDER — FLUTICASONE-SALMETEROL 230-21 MCG/ACT IN AERO
2.0000 | INHALATION_SPRAY | Freq: Two times a day (BID) | RESPIRATORY_TRACT | 1 refills | Status: DC
  Filled 2022-06-15: qty 12, 30d supply, fill #0
  Filled 2022-06-15: qty 120, fill #0
  Filled 2022-09-09: qty 12, 30d supply, fill #0
  Filled 2022-11-03: qty 12, 30d supply, fill #1
  Filled 2023-01-19 – 2023-01-28 (×3): qty 12, 30d supply, fill #2

## 2022-06-15 MED ORDER — GABAPENTIN 300 MG PO CAPS
300.0000 mg | ORAL_CAPSULE | Freq: Every day | ORAL | 1 refills | Status: DC
Start: 2022-06-15 — End: 2022-10-29
  Filled 2022-06-15 – 2022-09-09 (×3): qty 30, 30d supply, fill #0

## 2022-06-15 MED ORDER — TIOTROPIUM BROMIDE MONOHYDRATE 18 MCG IN CAPS
1.0000 | ORAL_CAPSULE | Freq: Every day | RESPIRATORY_TRACT | 1 refills | Status: DC
  Filled 2022-06-15 – 2023-01-28 (×5): qty 30, 30d supply, fill #0

## 2022-06-15 MED ORDER — SERTRALINE HCL 50 MG PO TABS
50.0000 mg | ORAL_TABLET | Freq: Every day | ORAL | 0 refills | Status: DC
Start: 2022-06-15 — End: 2022-09-09
  Filled 2022-06-15 (×2): qty 30, 30d supply, fill #0

## 2022-06-15 MED ORDER — IPRATROPIUM-ALBUTEROL 0.5-2.5 (3) MG/3ML IN SOLN
3.0000 mL | Freq: Four times a day (QID) | RESPIRATORY_TRACT | 99 refills | Status: DC | PRN
  Filled 2022-06-15 (×2): qty 180, 15d supply, fill #0
  Filled 2022-06-15: qty 360, 30d supply, fill #0

## 2022-06-15 MED ORDER — AMOXICILLIN-POT CLAVULANATE 875-125 MG PO TABS
1.0000 | ORAL_TABLET | Freq: Two times a day (BID) | ORAL | 0 refills | Status: DC
  Filled 2022-06-15 (×2): qty 14, 7d supply, fill #0

## 2022-06-15 MED ORDER — FUROSEMIDE 40 MG PO TABS
40.0000 mg | ORAL_TABLET | Freq: Two times a day (BID) | ORAL | 1 refills | Status: DC
Start: 2022-06-15 — End: 2023-02-25
  Filled 2022-06-15 (×2): qty 60, 30d supply, fill #0

## 2022-06-15 MED ORDER — ALBUTEROL SULFATE HFA 108 (90 BASE) MCG/ACT IN AERS
2.0000 | INHALATION_SPRAY | RESPIRATORY_TRACT | 0 refills | Status: DC | PRN
  Filled 2022-06-15 (×2): qty 18, 16d supply, fill #0

## 2022-06-15 NOTE — Assessment & Plan Note (Signed)
Patient is no longer smoking ?

## 2022-06-15 NOTE — Assessment & Plan Note (Signed)
Needs referral to ophthalmology

## 2022-06-15 NOTE — Assessment & Plan Note (Signed)
Needs referral to ophthalmology as soon as possible

## 2022-06-15 NOTE — Discharge Instructions (Signed)
Take all medications as prescribed. Keep all follow-up appointments as scheduled.  Do not consume alcohol or use illegal drugs while on prescription medications. Report any adverse effects from your medications to your primary care provider promptly.  In the event of recurrent symptoms or worsening symptoms, call 911, a crisis hotline, or go to the nearest emergency department for evaluation.   

## 2022-06-15 NOTE — Progress Notes (Signed)
   06/15/22 1347  [facility] Triage Screening (Walk-ins at [facility] only)  How Did You Hear About [name]? Self  What Is the Reason for Your Visit/Call Today? Pt is a 56 yo female who presented today voluntarily accompanied by her friend who did not participate in the assessment with her. Pt stated she has been experiencing worsening depression and this past Thursday, when her dog was stolen, she started having SI. Pt stated she "made threats to hurt myself on Thursday but I'm better now. Pt stated that she has been diagnosed with depression by her PCP at [facility] and prescribed anti-depressants. Earlier today she had an appointment with her PCP and he referred her to [facility] due to answers on the Depression screening and a need for medication management. Pt denied current SI, HI, NSSH, AVH, paranoia and any substance use beyond weekly consumption of 1-2 beers. She is seeking medication management for her psych meds.  How Long Has This Been Causing You Problems? > than 6 months  Have You Recently Had Any Thoughts About Hurting Yourself? No  Are You Planning to Commit Suicide/Harm Yourself At This time? No  Have you Recently Had Thoughts About Hurting Someone Karolee Ohs? No  Are You Planning To Harm Someone At This Time? No  Are you currently experiencing any auditory, visual or other hallucinations? No  Have You Used Any Alcohol or Drugs in the Past 24 Hours? No  Do you have any current medical co-morbidities that require immediate attention? Yes  Please describe current medical co-morbidities that require immediate attention: need for oxygen and very blurry vision  Clinician description of patient physical appearance/behavior: Pt was calm, cooperative, alert and appeared oriented. Pt did not appear to be responding to internal stimuli, experiencing delusional thinking or to be intoxicated.  Pt's speech and movement appeared within normal limits and appearance was unremarkable. Pt's mood seemed solemn and somewhat  depressed, and pt had a flat affect which was congruent. Pt's judgment and insight seemed within normal limits.  What Do You Feel Would Help You the Most Today? Treatment for Depression or other mood problem  Determination of Need Routine (7 days)  Options For Referral Medication Management;Outpatient Therapy   Keaunna Skipper T. Jimmye Norman, MS, [facility] Triage Specialist [facility]

## 2022-06-15 NOTE — Assessment & Plan Note (Signed)
Patient's had chronic bladder incontinence we will order incontinence supplies

## 2022-06-15 NOTE — Assessment & Plan Note (Signed)
Continue oxygen therapy.

## 2022-06-15 NOTE — Assessment & Plan Note (Signed)
Referral to behavioral health urgent care made

## 2022-06-15 NOTE — Telephone Encounter (Signed)
Copied from CRM [id number]. Topic: General - Other >> Jun 15, 2022 12:51 PM Franchot Heidelberg wrote: Reason for CRM: Talbert Forest from [facility] called regarding the patient's incontinence supplies request   Best contact: [phone] Fax: [phone]

## 2022-06-15 NOTE — Assessment & Plan Note (Signed)
Active suicidal ideation with no specific plan except she thinks she might take poison when she gets home  Patient accompanied by her minister who agreed to contract for safety and get her transported to behavioral health urgent care for evaluation

## 2022-06-15 NOTE — Assessment & Plan Note (Signed)
Compensated at this time

## 2022-06-15 NOTE — Assessment & Plan Note (Signed)
Monitor renal function 

## 2022-06-15 NOTE — Patient Instructions (Signed)
We have asked you to go to the [facility] urgent care today for assessment of your mental health  Note sertraline was sent to our pharmacy downstairs would still like you assessed by mental health today  Urgent referral to ophthalmology also was made  Screening labs obtained at this visit  Refills on all medications sent downstairs  Take Augmentin twice a day for 7 days  Return to Dr. Delford Field 6 weeks  Incontinence supplies will be issued

## 2022-06-15 NOTE — Assessment & Plan Note (Signed)
Monitor off anticoagulation for now

## 2022-06-15 NOTE — Telephone Encounter (Signed)
At the request of Dr Joya Gaskins, I met with the patient today when she was in the clinic for her appointment.  Her friend, Jacalyn Lefevre was present while I met with her.  She explained that she has been depressed for weeks and this depression has been aggravated by the recent loss of her dog.  She then explained that she is unable to pay her bills.  She owes $905.76 to [employer] and she is not able to afford that. She said she has contacted [place] and sent letters in the past to request a payment  plan. She has attempted to contact [place] and [employer] to request assistance with bill payment but she is still waiting to hear back from both agencies.  She is dependent on O2 and is fearful of loosing power and having no O2.  I offered to call [place] with her and she agreed.  She spoke at length of her struggles with her breathing difficulties, her decreased vision as well as her inability to pay her bills. She currently receive only $609/month SSI that is needed for rent, utilities, food.  She does not know how she will ever pay off her electric bill and this worry has increased her anxiety.   The patient called [employer] when I was present. We spoke to Vanessa/Customer Service about the patient's need for O2 and fear of power being disconnected. We inquired if a payment plan is an option. Lorriane Shire explained that the patient has " maximized"  her options for a payment plan, she has already used it twice.  Lorriane Shire assured her that there is no order to disconnect her service.  She said that the patient needs to pay  $394.77 before 06/30/2022 but again told her that there is no order to disconnect her power.  Lorriane Shire also told the patient that she should have the following agencies call [employer] on her behalf: DSS, [employer] and [employer].  When we asked to speak with a supervisor, Lorriane Shire said that the supervisor would not be able to assist because the patient has no further option for payment plan.  The  patient has no idea how she will ever catch up with her utility payments.   I explained that we can make a referral to [employer] [facility] [place] ([facility])and they may be able to assist her with [place]. I told her that there is no guarantee that they will be able to help; but they may be able to offer other options for assistance to ensure that she does not loose her power.  She was agreeable and a referral was submitted to [facility].   The patient agreed to go to [facility] voluntarily after leaving this clinic.  Her friend, Jacalyn Lefevre # [phone] agreed to transport her. She left without O2.   I contacted [place] prior to patient leaving the clinic to request an O2 tank be delivered to her for transport. She did not bring her tank from home.  She explained that she has a self fill system and cannot see if the portable tank was full. The patient was anxious to get to [facility] and after receiving clearance from Dr Joya Gaskins, she left the clinic without the O2.  I called [place] to inform them patient was coming to their center and had to leave a message on the Adult Urgent Care voicemail, call back requested.   I called [place] [place] and cancelled the order for tank  delivery to [facility]. I then spoke to [facility] and requested they send a portable tank to [facility].  Melissa said that they would contact the patient.

## 2022-06-15 NOTE — Telephone Encounter (Signed)
    Chief Complaint: Pt. At [facility] Urgent Care without her portable O2. Symptoms: States "I think they are getting me a tank." Frequency: today Pertinent Negatives: Patient denies  Disposition: [] ED /[] Urgent Care (no appt availability in office) / [] Appointment(In office/virtual)/ []  [place] [facility]/ [] Home Care/ [] Refused Recommended Disposition /[] [facility]/ [x]  Follow-up with PCP Additional Notes:   Answer Assessment - Initial Assessment Questions 1. RESPIRATORY STATUS: "Describe your breathing?" (e.g., wheezing, shortness of breath, unable to speak, severe coughing)      SOB, pt. At [facility] 2. ONSET: "When did this breathing problem begin?"      Today 3. PATTERN "Does the difficult breathing come and go, or has it been constant since it started?"      Comes and goes, uses O2 at home 4. SEVERITY: "How bad is your breathing?" (e.g., mild, moderate, severe)    - MILD: No SOB at rest, mild SOB with walking, speaks normally in sentences, can lie down, no retractions, pulse < 100.    - MODERATE: SOB at rest, SOB with minimal exertion and prefers to sit, cannot lie down flat, speaks in phrases, mild retractions, audible wheezing, pulse 100-120.    - SEVERE: Very SOB at rest, speaks in single words, struggling to breathe, sitting hunched forward, retractions, pulse > 120      Mild 5. RECURRENT SYMPTOM: "Have you had difficulty breathing before?" If Yes, ask: "When was the last time?" and "What happened that time?"      Yes 6. CARDIAC HISTORY: "Do you have any history of heart disease?" (e.g., heart attack, angina, bypass surgery, angioplasty)      No 7. LUNG HISTORY: "Do you have any history of lung disease?"  (e.g., pulmonary embolus, asthma, emphysema)     Yes 8. CAUSE: "What do you think is causing the breathing problem?"      Does not have her O2 tank with her 9. OTHER SYMPTOMS: "Do you have any other symptoms? (e.g., dizziness, runny nose, cough,  chest pain, fever)     None 10. O2 SATURATION MONITOR:  "Do you use an oxygen saturation monitor (pulse oximeter) at home?" If Yes, ask: "What is your reading (oxygen level) today?" "What is your usual oxygen saturation reading?" (e.g., 95%)       Unsure 11. PREGNANCY: "Is there any chance you are pregnant?" "When was your last menstrual period?"       No 12. TRAVEL: "Have you traveled out of the country in the last month?" (e.g., travel history, exposures)       No  Protocols used: Breathing Difficulty-A-AH

## 2022-06-15 NOTE — ED Provider Notes (Signed)
Behavioral Health Urgent Care Medical Screening Exam  Patient Name: Michele Parrish MRN: [id number] Date of Evaluation: 06/15/22 Chief Complaint:   Diagnosis:  Final diagnoses:  Current moderate episode of major depressive disorder, unspecified whether recurrent (HCC)    History of Present illness: Michele Parrish is a 56 y.o. female presents to [facility] urgent care after referral by [facility] health and wellness.  States she had been experiencing some depression related to her declining health.  States she is diagnosed with COPD for the past 2 years where she required 5 L of oxygen.  Reports her dog was taken last week.  States she was prescribed  sertraline in the past but she reports was helping her mood however due to tumor health she is unable to make follow-up appointments.  She is denying suicidal or homicidal ideations.  Denies auditory visual hallucinations.  NP assisted patient to second floor for walk-in therapy/medication appointment.  During evaluation Michele Parrish is sitting in no acute distress. She is alert/oriented x 4; calm/cooperative; and mood congruent with affect. She is speaking in a clear tone at moderate volume, and normal pace; with good eye contact. Her thought process is coherent and relevant; There is no indication that she is currently responding to internal/external stimuli or experiencing delusional thought content; and she has denied suicidal/self-harm/homicidal ideation, psychosis, and paranoia.   Patient has remained calm throughout assessment and has answered questions appropriately.    At this time Michele Parrish is educated and verbalizes understanding of mental health resources and other crisis services in the community. She is instructed to call 911 and present to the nearest emergency room should she experience any suicidal/homicidal ideation, auditory/visual/hallucinations, or detrimental worsening of her mental health condition. She was a also  advised by Clinical research associate that she could call the toll-free phone on insurance card to assist with identifying in network counselors and agencies or number on back of Medicaid card t speak with care coordinator    Psychiatric Specialty Exam  Presentation  General Appearance:Appropriate for Environment  Eye Contact:Good  Speech:Clear and Coherent  Speech Volume:Normal  Handedness:Right   Mood and Affect  Mood:Anxious; Depressed  Affect:Congruent   Thought Process  Thought Processes:Coherent  Descriptions of Associations:Intact  Orientation:Full (Time, Place and Person)  Thought Content:Logical    Hallucinations:Other (comment)  Ideas of Reference:None  Suicidal Thoughts:No  Homicidal Thoughts:No   Sensorium  Memory:Immediate Good; Recent Good; Remote Good  Judgment:Fair  Insight:Fair   Executive Functions  Concentration:Good  Attention Span:Good  Recall:Fair  Fund of Knowledge:Good  Language:Good   Psychomotor Activity  Psychomotor Activity:Normal   Assets  Assets:Communication Skills; Social Support; Health and safety inspector; Physical Health   Sleep  Sleep:Fair  Number of hours: No data recorded  Nutritional Assessment (For OBS and FBC admissions only) Has the patient had a weight loss or gain of 10 pounds or more in the last 3 months?: No Has the patient had a decrease in food intake/or appetite?: No Does the patient have dental problems?: No Does the patient have eating habits or behaviors that may be indicators of an eating disorder including binging or inducing vomiting?: No Has the patient recently lost weight without trying?: 0 Has the patient been eating poorly because of a decreased appetite?: 0 Malnutrition Screening Tool Score: 0    Physical Exam: Physical Exam Vitals and nursing note reviewed.  Cardiovascular:     Rate and Rhythm: Normal rate.     Pulses: Normal pulses.  Neurological:  Mental Status: She is alert  and oriented to person, place, and time.  Psychiatric:        Mood and Affect: Mood normal.        Behavior: Behavior normal.        Thought Content: Thought content normal.    Review of Systems  Respiratory:  Shortness of breath: COPD.   Cardiovascular: Negative.   Musculoskeletal: Negative.   Skin: Negative.   Psychiatric/Behavioral:  Positive for depression. Negative for suicidal ideas. The patient is nervous/anxious.   All other systems reviewed and are negative.  Blood pressure 112/78, pulse 94, temperature 97.6 F (36.4 C), temperature source Oral, resp. rate 18, SpO2 99 %. There is no height or weight on file to calculate BMI.  Musculoskeletal: Strength & Muscle Tone: within normal limits Gait & Station: normal Patient leans: N/A   BH[facility] MSE Discharge Disposition for Follow up and Recommendations: Based on my evaluation the patient does not appear to have an emergency medical condition and can be discharged with resources and follow up care in outpatient services for Individual Therapy   Oneta Rack, NP 06/15/2022, 2:01 PM

## 2022-06-15 NOTE — Assessment & Plan Note (Signed)
Continue maintenance therapy and oxygen

## 2022-06-15 NOTE — Telephone Encounter (Signed)
[place] MLP REFERRAL FORM  TO: Medical-Legal Partnership (MLP) Program, [employer] [facility] [place]  FAX: [phone] (no cover sheet needed)  FROM: INFORMATION ABOUT REFERRING HEALTH CARE PROFESSIONAL We need the following information to be able to confirm that we received this referral. If we are unable to contact the referred patient/caregiver, we may contact you to request additional information.   Name: [name], RN Department: [facility] AND [facility] AND WELLNESS [address] [place] [address] [phone]    Direct Phone #: [phone] Email: [email or web address]  Date: 06/15/22   Reason(s) for Referral: Potential Legal Needs That May Be Addressed by [facility] SOCIAL DRIVERS OF HEALTH THAT MAY HAVE A LEGAL REMEDY  Food Insecurity, Income Security, Access to [employer] (1) []  Food Stamps/Nutrition Assistance []  Social Security Disability Benefits (SSI or SSDI) []  Medicaid []  Help with enrollment in insurance (2) Housing Security/Safety of Housing Conditions  []  Eviction  []  Loss of housing subsidy for public housing []  Mortgage foreclosure []  Housing repairs/unsafe conditions in rental housing []  Housing discrimination  Personal/ Family Safety and Individual Rights Other:  []  Domestic Violence Protective Order [x]  Please describe (3): the patient is unable to afford her [employer] bill - $905.76.  She is very fearful of her power being disconnected because she is on continuous oxygen.  I called [employer] with her today and we were informed that she " maximized" her payment options. She needs to pay $394.77 by 06/30/2022 and does not have the funds and needs her oxygen. She has requested assistance from [employer] and DSS but has not heard back.  I hope you are able to offer some assistance   thank you.    (1)  With  the exception of helping with enrollment in , we do not assist with initial applications for public benefits. We may be able to assist those whose applications are denied or whose benefits are terminated or reduced.  (2) Federally-certified can help with applications for [employer].  (3) We recommend contacting [employer] [facility] [place] to request a de-identified consult prior to making a referral for topics not included here.     Deadlines/Emergencies Is there a hearing or appeal deadline within the next 10 days? [x]  No  []  Not sure []  Yes. When?   Has the patient/caregiver received court papers about this issue?  [x]  No  []  Not sure []  Yes. When?   Is there another legal emergency? [x]  No  []  Not sure []  Yes. Please describe:      3. Please provide other information to help understand the patient's/caregiver's potential legal problem(s), including any concerns about how the problem(s) might affect the patient's/caregiver's health and well-being. Please include with this referral form any relevant documents if the patient/caregiver has them, such as a termination notice, denial letter, or court document.            4. Information about the Patient/Caregiver Please complete and review ALL information below with the patient/caregiver prior to sending this referral.  Patient Information Name: Michele Parrish Date of birth: 10/09/1966  If patient is   under 18 Name of parent/caregiver:  Parent's/caregiver's date of birth:  Address of residence: [facility] and/or mailing address: [address]  [place] 08/30/2022   Can we send mail to this address? []  Yes [x]  No *Please ask, for some, this is  a matter of safety.  Phone #s *Please provide as many as possible Phone #1:   [phone] Phone #2:Sam Hickerson # [phone]  (friend)   Can we leave messages at each of these #s? [x]  Yes []  No *Please ask, for  some, this is a matter of safety. Preferred day(s) and time(s) for calls?    Email address (not required but helpful)   Best days/ times to contact No text messages. The patient's vision is limited.   Preferred Language  [x]  English    []  Spanish   []  Other:       5. Information About Others Involved in the Potential Legal Problem Before we can offer legal assistance to the patient/caregiver, we must determine that we have no conflicts of interest with any potential adverse party. We will not contact the adverse party without the patient's/caregiver's permission.  If potential problem is with an agency  []  of Social Services                   []  Social Security Administration []  Other agency:   If problem is related to housing Name of landlord, , owner, housing authority, or mortgage company:  Phone #:     If problem is related to family safety or violence Name, phone number, address, and date of birth for the person(s) involved:       6. Consent for Referral and Follow Up  Option 1. Written Consent       (Initials) I give permission to [facility] to refer me to [employer] [facility] [place].   I give permission to Legal Aid of to talk with [facility] about my/my family's potential legal problem(s) to help them to determine if they can help me and/or my family members to resolve these problems.  Signature of Patient or Parent/Caregiver:                                                                                                Today's date: 06/15/22    Signature of Referring Health Professional:                                                                                             Today's date: 06/15/22  Option 2. Verbal Consent - If Written Consent is Not Possible  [x]  I have read the consent language written above (in Option 1) to the patient and/or the patient's parent/caregiver and obtained verbal permission from: [facility] of  patient or parent/caregiver] [name] to make this MLP referral on 06/15/22

## 2022-06-15 NOTE — Assessment & Plan Note (Signed)
Pneumonia resolved.

## 2022-06-15 NOTE — Assessment & Plan Note (Signed)
Maintain inhaled medications and refills given

## 2022-06-16 ENCOUNTER — Telehealth: Payer: Self-pay

## 2022-06-16 ENCOUNTER — Other Ambulatory Visit (HOSPITAL_COMMUNITY): Payer: Self-pay

## 2022-06-16 LAB — COMPREHENSIVE METABOLIC PANEL
ALT: 19 IU/L (ref 0–32)
AST: 22 IU/L (ref 0–40)
Albumin/Globulin Ratio: 1.1 — ABNORMAL LOW (ref 1.2–2.2)
Albumin: 4.6 g/dL (ref 3.8–4.9)
Alkaline Phosphatase: 119 IU/L (ref 44–121)
BUN/Creatinine Ratio: 10 (ref 9–23)
BUN: 9 mg/dL (ref 6–24)
Bilirubin Total: 0.4 mg/dL (ref 0.0–1.2)
CO2: 24 mmol/L (ref 20–29)
Calcium: 9.3 mg/dL (ref 8.7–10.2)
Chloride: 102 mmol/L (ref 96–106)
Creatinine, Ser: 0.91 mg/dL (ref 0.57–1.00)
Globulin, Total: 4.2 g/dL (ref 1.5–4.5)
Glucose: 118 mg/dL — ABNORMAL HIGH (ref 70–99)
Potassium: 3.8 mmol/L (ref 3.5–5.2)
Sodium: 143 mmol/L (ref 134–144)
Total Protein: 8.8 g/dL — ABNORMAL HIGH (ref 6.0–8.5)
eGFR: 75 mL/min/{1.73_m2} (ref >59)

## 2022-06-16 LAB — CBC WITH DIFFERENTIAL/PLATELET
Basophils Absolute: 0.1 10*3/uL (ref 0.0–0.2)
Basos: 1 %
EOS (ABSOLUTE): 0.1 10*3/uL (ref 0.0–0.4)
Eos: 2 %
Hematocrit: 46.6 % (ref 34.0–46.6)
Hemoglobin: 14.5 g/dL (ref 11.1–15.9)
Immature Grans (Abs): 0 10*3/uL (ref 0.0–0.1)
Immature Granulocytes: 0 %
Lymphocytes Absolute: 3.5 10*3/uL — ABNORMAL HIGH (ref 0.7–3.1)
Lymphs: 47 %
MCH: 26.8 pg (ref 26.6–33.0)
MCHC: 31.1 g/dL — ABNORMAL LOW (ref 31.5–35.7)
MCV: 86 fL (ref 79–97)
Monocytes Absolute: 0.3 10*3/uL (ref 0.1–0.9)
Monocytes: 4 %
Neutrophils Absolute: 3.5 10*3/uL (ref 1.4–7.0)
Neutrophils: 46 %
Platelets: 214 10*3/uL (ref 150–450)
RBC: 5.42 x10E6/uL — ABNORMAL HIGH (ref 3.77–5.28)
RDW: 12.6 % (ref 11.7–15.4)
WBC: 7.5 10*3/uL (ref 3.4–10.8)

## 2022-06-16 LAB — THYROID STIMULATING IMMUNOGLOBULIN: Thyroid Stim Immunoglobulin: <0.1 IU/L (ref 0.00–0.55)

## 2022-06-16 LAB — THYROID PANEL WITH TSH
Free Thyroxine Index: 1.8 (ref 1.2–4.9)
T3 Uptake Ratio: 27 % (ref 24–39)
T4, Total: 6.5 ug/dL (ref 4.5–12.0)
TSH: 2.52 u[IU]/mL (ref 0.450–4.500)

## 2022-06-16 NOTE — Telephone Encounter (Signed)
noted 

## 2022-06-16 NOTE — Telephone Encounter (Signed)
I received a message from [facility] stating the the patient is calling about her referral to [facility].  She called them and they had not received the referral yet but told her that they would not be able to see her that even if they have the referral, she would not be seen until January.   She said her vision is getting worse and she needs to be seen as soon as possible, preferably tomorrow.    Arna Medici- is there another option for her?

## 2022-06-16 NOTE — Telephone Encounter (Signed)
Called to let them know paperwork has been faxed

## 2022-06-16 NOTE — Telephone Encounter (Signed)
I marked the referral urgent I am worried about glaucoma  she needs to be seen today or tomorrow urgently any thoughts where we can send her she has medicaid

## 2022-06-16 NOTE — Progress Notes (Signed)
Let pt know all labs normal  no thyroid eye disease

## 2022-06-17 ENCOUNTER — Telehealth: Payer: Self-pay

## 2022-06-17 NOTE — Telephone Encounter (Signed)
I spoke to Dimas Alexandria, JD, MSW/ [employer] [place] yesterday regarding the referral. She explained that they cannot work with [employer] or [employer]. Her suggestion is for the patient to contact her insurance company as they may have case management available to assist her with either contacting [employer] or assisting with bill payment.   I called patient # [phone]  to discuss response from [facility] and to confirm that she has arranged transportation to her eye appointment on 06/19/2022 and inform her that the referral for PCS has been placed with [facility]. Message was left with call back requested.

## 2022-06-17 NOTE — Telephone Encounter (Signed)
-----   Message from Storm Frisk, MD sent at 06/16/2022 12:09 PM EDT ----- Let pt know all labs normal  no thyroid eye disease

## 2022-06-17 NOTE — Telephone Encounter (Signed)
Pt was called and is aware of results, DOB was confirmed.  ?

## 2022-06-17 NOTE — Telephone Encounter (Signed)
Completed PCS referral faxed to [facility] °

## 2022-06-18 NOTE — Telephone Encounter (Signed)
  I called patient again # [phone]  to discuss response from  [employer] [place]  regarding [employer] and inform her that the referral for PCS has been placed with [facility]. Message was left with call back requested.   The number for [facility] Medicaid member services is [phone], and she can ask to speak to a care manager.  Another number to try to request care management [phone].

## 2022-06-19 ENCOUNTER — Telehealth: Payer: Self-pay | Admitting: Critical Care Medicine

## 2022-06-19 NOTE — Telephone Encounter (Signed)
Copied from CRM [id number]. Topic: General - Inquiry >> Jun 19, 2022  2:47 PM De Blanch wrote: Reason for CRM: Pt requesting a call back from [name], RN. Pt declined to provide further details stated nurse Erskine Squibb is helping her with something important.

## 2022-06-22 ENCOUNTER — Telehealth: Payer: Self-pay | Admitting: Emergency Medicine

## 2022-06-22 ENCOUNTER — Telehealth: Payer: Self-pay | Admitting: Critical Care Medicine

## 2022-06-22 NOTE — Telephone Encounter (Signed)
Copied from CRM [id number]. Topic: General - Inquiry >> Jun 19, 2022  2:47 PM De Blanch wrote: Reason for CRM: Pt requesting a call back from [name], RN. Pt declined to provide further details stated nurse Erskine Squibb is helping her with something important. >> Jun 22, 2022  1:06 PM Macon Large wrote: Pt requests that Erskine Squibb return her call asap. Cb# [phone]

## 2022-06-22 NOTE — Telephone Encounter (Signed)
I spoke to the patient today and that information is documented in another telephone encounter

## 2022-06-22 NOTE — Telephone Encounter (Signed)
Thank you for the updates.  

## 2022-06-22 NOTE — Telephone Encounter (Signed)
I spoke to the patient and explained that [employer] [place] is not able to assist with [employer], they don't assist with issues regarding utilities.   I told the patient that the attorney suggested that that she  contact her insurance company - [facility], to inquire if they provide any case management assistance with utilities.  The patient said that she can't see and requested I call for her.  I told her I would call this afternoon and get back to her.   The patient said she saw the doctor at [facility] on Thurs 06/18/2022 and was referred directly to [facility] ophthalmology the same day. She was seen there and prescribed multiple eye drops and said she has an appointment there again tomorrow, 06/23/2022, to re-check her eye pressures.

## 2022-06-22 NOTE — Telephone Encounter (Signed)
Referral for PCS was faxed to [facility] 06/17/2022.

## 2022-06-22 NOTE — Telephone Encounter (Signed)
I called [facility] Medicaid LTSS Care Coordination # [phone], spoke to [facility] and explained patient's need for assistance with paying her electric bill as she is O2 dependent and cannot have her electricity turned off.  He said that he will have a community health worker reach out to her and provide her with some resources to contact for assistance. If she is denied help from  2 resources, [facility] can be begin process for financial assistance, possibly $250. I provided Lemar with patient's current phone number and also explained that she has difficulty using the phone due to her decreased vision.   I called the patient and informed her that the community case worker will be calling her to discuss assistance options for her electric bill.

## 2022-06-22 NOTE — Telephone Encounter (Signed)
I spoke to the patient today and that information is documented in another telephone encounter 

## 2022-06-23 ENCOUNTER — Telehealth (HOSPITAL_COMMUNITY): Payer: Medicaid Other | Admitting: Licensed Clinical Social Worker

## 2022-06-23 NOTE — Telephone Encounter (Addendum)
I spoke to [place] RN/ CNP regarding referring patient to [facility] for assistance with paying her electric bill. I explained current status and unsuccessful attempts to obtain assistance from  community resources. Patient is in danger of having her power disconnected and she needs the power for her O2.  [place] recommended to submit the request to [facility] but to go ahead and pay amount of patient's electric bill that is due 9/5. [place] said that the [employer] will reimburse this clinic.  Scherry Ran, Assistant Director [facility] notified of the plan to pay electric bill for the patient and she gave approval to make the payment   I called the patient to explain that this clinic obtained funding to pay the amount of her electric bill that is due 06/30/2022.  She will be responsible for payments going forward. She said she was at the eye doctor. I instructed her to call me back with her [employer] account number so I can make the payment.  She agreed to call me back tomorrow.    I called the [employer] and spoke to Ms Jeralyn Bennett to inquire if they provide any assistance for individuals who are unable to pay their electric bill and are in danger of having power shut off and in need of O2.   She said they are only able to help if this issue is related to COVID.  Otherwise she said to check [employer] and if local resources are not able to assist her, she can call the [facility] Utilities Commission # [phone]

## 2022-06-24 ENCOUNTER — Telehealth: Payer: Self-pay | Admitting: Critical Care Medicine

## 2022-06-24 NOTE — Telephone Encounter (Signed)
Copied from CRM [id number]. Topic: General - Other >> Jun 24, 2022 10:03 AM Clide Dales wrote: Patient states that [employer] sent back the paperwork for her incontinence supplies to be corrected. Her order cannot be filled until that paperwork is returned. Patient also wanted to speak with Erskine Squibb. Please follow up with patient.

## 2022-06-24 NOTE — Telephone Encounter (Signed)
Call received from patient's friend/pastor , Sam, who was with her at her appointment at [facility] last week, called and provided patient's [employer] account number [id number].  I called the patient  and informed her that I have received approval to make the payment to [employer] to keep her electricity on. This will be a one time payment of $ 392.77. she will be responsible for all costs after that.  She said she understands that and she asked that I request another payment plan for her, stating she can pay about $250/month.     She also asked me call [employer] # [phone] re. Incontinence supplies.  She is not sure what they need to process the order.    She explained that she had another eye appointment at [facility] yesterday.  She had been having  problems with administering the eye drops and had missed some doses. 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  She said that the pressure remains high in her eyes and that is what has caused her headaches and blurry vision. Her next appt with the eye doctor  is 07/07/2022. She spoke to eye doctor about difficulty administering eye gtts correctly and they color coded the tops of the containers and she said that has been helpful she should not have a problem administering them  She stated that  she received a call from [facility] regarding PCS and they are coming out to assess her needs next week.  She said she has not heard anything from LTSS about utility assitance but she will discuss further with the [facility] representative during the home visit.   She then said that she needs assistance with paying her gas bill, it has been off for a year, so she has no hot water. She said that her friend, Doreatha Martin is working on trying to obtain funding for her.   I called  [employer] [phone] and spoke to Marisue Ivan and explained that the patient's PCP office would like to make a payment on her account as she needs to keep her  electricity on for her O2.  Her current balance due is $905.76. Marisue Ivan could not take payment information, it had to be done through the automated system.  I made a payment for the disconnection fee: $392.77-Confirmation # [id number] and I requested the credit card information not be saved.  They are unable to send a receipt to my email, it will go to patient email or text.  I spoke to [place] regarding setting up an  installment/payment plan for the patient.  She said an installment plan could be set up on the new balance $510.00  The payment plan does not include her regular monthly bill, it is only for the back money owed on the account and can be paid off over 6 months.   1st payment:  07/15/2022- $127.61 + regular monthly bill 2nd payment - 6th payment: $76.20 + regular monthly bill Marisue Ivan said that the patient's regular monthly bills are variable amounts.    I then called the patient back and informed her what I paid on her account and that a payment plan has been set up for her. She was extremely appreciative and said this has significantly helped her anxiety. She will save the text or emailed billing information for me. I also explained to her that she needs to contact the [place] [employer] Commission and the Services for the Blind in  [place] to inquire if they are able to provide any supportive services for her.      [place] Utility Commission Services for the Blind in [place]

## 2022-06-25 NOTE — Telephone Encounter (Signed)
I spoke to Shirley/ [employer] regarding documentation needed for incontinence supply order. She explained that they faxed documents for Dr Delford Field to sign. I told her that I have not seen them and requested she email them to me. She said they can be faxed back to [phone].  Email received with documents needing completion/signature.  Dalene Carrow, CMA had faxed these signed documents to [facility] on 06/22/2022.    I called [facility] and spoke to Roy/ documentation. He said that they never received those documents as they were faxed to a different department.  He requested they be re-faxed to the number noted above.  I re-faxed the documents to [phone].

## 2022-06-25 NOTE — Telephone Encounter (Signed)
I spoke to the patient since this call was received and have addressed the issues with her.

## 2022-07-01 ENCOUNTER — Ambulatory Visit: Payer: Self-pay

## 2022-07-01 ENCOUNTER — Telehealth: Payer: Self-pay | Admitting: Critical Care Medicine

## 2022-07-01 NOTE — Telephone Encounter (Signed)
Copied from CRM [id number]. Topic: Quick Communication - Home Health Verbal Orders >> Jul 01, 2022 10:26 AM Pincus Sanes wrote: Caller/Agency: Eddie North RN [place] Callback Number: [phone] Requesting: Home eval be done for a Home Health Aid  Caller is Nurse with [facility] and feels pt needs a home Health AID for ongoing help. She is requesting a referral from DR Delford Field for any [facility] provider available

## 2022-07-01 NOTE — Telephone Encounter (Signed)
Called and left message for Mrs.Christell Constant

## 2022-07-01 NOTE — Telephone Encounter (Signed)
Call returned to Eddie North, RN [phone], message left with call back requested.   Need to inform her that per the patient, she is supposed to be assessed for PCS this week.

## 2022-07-01 NOTE — Telephone Encounter (Signed)
Summary: [facility] wants more specfics on pt   Caller is a Charity fundraiser with [employer] and called office to get Dr Delford Field to assist with [facility], message already sent.  Additionally caller RN, with county, Eddie North is requesting a cb from a nurse with pt diagnosis.Call her at [phone]

## 2022-07-02 ENCOUNTER — Telehealth: Payer: Self-pay | Admitting: Critical Care Medicine

## 2022-07-02 NOTE — Telephone Encounter (Signed)
Call returned to [facility], [phone] , message left with call back requested.

## 2022-07-02 NOTE — Telephone Encounter (Signed)
Another fax received from [facility] requesting a completed rx and CMN for the incontinence supplies.  This information has been faxed multiple times.  I called [facility] 4 times this afternoon and was able to leave a message with the reimbursement department requesting clarification of what is needed and determining if  they ever received the faxes that were sent. Message left with call back requested.  I tried to reach [employer] [phone] x [phone], who sent the fax but neither Marcelino Duster or Lanora Manis with customer service were able to transfer me to that extension. I was transferred to reimbursement Dept.

## 2022-07-02 NOTE — Telephone Encounter (Signed)
Cherylann Ratel from Social services returning your call. please call her back at [phone]  Unable to get a response from anyone to take her call.

## 2022-07-03 ENCOUNTER — Telehealth: Payer: Self-pay | Admitting: Emergency Medicine

## 2022-07-03 NOTE — Telephone Encounter (Signed)
Copied from CRM [id number]. Topic: General - Inquiry >> Jul 03, 2022 10:10 AM Haroldine Laws wrote: Reason for CRM: Bufford Spikes with [facility] Dept SS called asking Erskine Squibb to return her call asap.  CB@  [phone]

## 2022-07-03 NOTE — Telephone Encounter (Signed)
I spoke to [facility] APS.   She was requesting a referral for PCS for patient. I explained that the referral has already been made and the patient told me that she was to be evaluated this week.  Barbette Or said she would follow up with the patient .

## 2022-07-03 NOTE — Telephone Encounter (Signed)
Copied from CRM [id number]. Topic: General - Other >> Jul 02, 2022  5:43 PM Pincus Sanes wrote: Westley Hummer, last name withheld, states she is returning Jane's call re supplies for pt/  Caller is with [facility].  [phone]

## 2022-07-03 NOTE — Telephone Encounter (Signed)
Duplicate message. 

## 2022-07-08 NOTE — Telephone Encounter (Signed)
I have spoken to [facility] and the information is documented in a telephone encounter dated today.

## 2022-07-08 NOTE — Telephone Encounter (Signed)
I spoke to Michele Parrish/ [facility] regarding information needed to complete the order for incontinence supplies.   He said they did not receive the CMN with a primary diagnosis other than R32 which is not a primary diagnosis. I explained that I have a CMN with J96.11 as the primary that had been faxed to [facility]; but he stated he does not have that copy.   Regarding the prescription, he stated that R32 cannot be a primary diagnosis and he explained that the provider can error out ( initial and date) the R32 and put in J96.11.  He also stated that nothing else is needed on the prescription   I then re-faxed the CMN and updated prescription to # [phone].

## 2022-07-09 ENCOUNTER — Telehealth: Payer: Self-pay

## 2022-07-09 NOTE — Telephone Encounter (Signed)
Opened in error

## 2022-07-21 ENCOUNTER — Ambulatory Visit: Payer: Medicaid Other | Admitting: Critical Care Medicine

## 2022-08-06 ENCOUNTER — Telehealth: Payer: Self-pay

## 2022-08-06 NOTE — Telephone Encounter (Signed)
I called patient [phone] to inquire how she has been doing and check the status of her electric and hot water. Message left with call back requested. She was hospitalized at [facility] 07/06/2022 - 07/20/2022.

## 2022-08-13 NOTE — Telephone Encounter (Signed)
I spoke to the patient and she explained how she was in the hospital for IV antibiotics for her eyes.  She is very glad to be home but is very stressed out about the possibility of losing her eyesight. She stated that the doctors told her that she will most likely need surgery on her eyes but it is not laser surgery. They told her she would need to go to rehab after the procedure and that has her even more concerned.  She also noted that the doctors told her there is a chance she could lose her eyesight by the first of next year.   She feels overwhelmed about the many health and environmental problems that she is trying to deal with.  She said she is dealing with one problem after another   Re: [employer]- she is trying to stay with the payment plan but was a little short last month and hopes to catch up with her payments this month.   Re: water bill.  She has not spoken to her friend who was assisting her and she is not sure where that stands.    She said that she has trouble getting to her phone and misses many calls.  She is not able to see the phone and is not able to check the phone number of the callers to know who called.   She said that the hospital staff tried to set up home health for her but no agency would accept the referral.  I asked her about the [facility] referral.  She had told me that [facility] had called her about coming out to do an assessment. Now she states that they never called. I explained to her that I can submit a new referral for PCS.  We can also call the [place] Utility Commission  an Services for the Blind.  She stated that she has an appointment at [facility] on 08/18/2022 and she will find out more about the treatment for her eyes. She asked that I hold off contacting anyone until after that appointment. She said she will call me 10/24 after the appointment or 10/25 and update me with the plan . At that time she will decide if she wants to move ahead with PCS and contacting Utility  Commission . It will depend on if/when her surgery is scheduled.

## 2022-09-03 NOTE — Telephone Encounter (Signed)
I called to check on the patient because I have not heard from her.   She explained that she has been busy with multiple medical appointments. She said that her eye doctor gave her more eye drops to use but she stated she she thinks her vision is getting worse.  Surgery has not been scheduled yet and her next appointment with the eye doctor is 09/23/2022.    She also stated that she saw a pulmonologist and had a CT scan done and they are concerned about the nodules and have scheduled her for a repeat CT scan in 10/07/2022.  She said she will schedule an appointment with Dr Delford Field after she completes the CT scan and ophthalmology appointments.   Regarding her electric bill, she said she is adhering to the payment plan and had not had any problems keeping current with payments.  She said there are no problems with her water.  However she has problems with the gas.   She said she is working with a SW with [employer] to address this issue and is waiting for a call back from the SW.  Regarding PCS- she stated she really needs the help and has been in contact with [facility] about the services and Jeanella Craze ( she is not sure of Stephanie's role) is coming to see her on 09/11/2022 to assess her for PCS.   She also mentioned that another SW is helping her obtain an electronic  speaking device to read her prescription labels.  She stated that her pills are not a problem, she is able to feel the pills and know what they are but she might be interested in bubble packing. She said that the prescription reader will be most helpful for reading the eye drop prescriptions.   She continues to have difficulty placing calls and retrieving messages from her phone.  She was agreeable to having me call [place] [facility] to inquire if there is any device that can assist her with this

## 2022-09-09 ENCOUNTER — Other Ambulatory Visit (HOSPITAL_COMMUNITY): Payer: Self-pay

## 2022-09-09 ENCOUNTER — Telehealth: Payer: Self-pay

## 2022-09-09 ENCOUNTER — Other Ambulatory Visit: Payer: Self-pay

## 2022-09-09 ENCOUNTER — Other Ambulatory Visit: Payer: Self-pay | Admitting: Critical Care Medicine

## 2022-09-09 DIAGNOSIS — F321 Major depressive disorder, single episode, moderate: Secondary | ICD-10-CM

## 2022-09-09 MED ORDER — SERTRALINE HCL 50 MG PO TABS
50.0000 mg | ORAL_TABLET | Freq: Every day | ORAL | 0 refills | Status: DC
  Filled 2022-09-09: qty 30, 30d supply, fill #0

## 2022-09-09 NOTE — Telephone Encounter (Signed)
I called and left a message for Maretta Bees, SW/ [facility] for the Blind:  [phone], call back requested.   I want to refer patient for information about resources available.  She is unable to see the numbers on her phone to place a call and she is requesting that the SW call her

## 2022-09-10 ENCOUNTER — Other Ambulatory Visit (HOSPITAL_COMMUNITY): Payer: Self-pay

## 2022-09-11 ENCOUNTER — Other Ambulatory Visit: Payer: Self-pay

## 2022-09-14 ENCOUNTER — Other Ambulatory Visit: Payer: Self-pay

## 2022-09-23 DIAGNOSIS — H30031 Focal chorioretinal inflammation, peripheral, right eye: Secondary | ICD-10-CM | POA: Insufficient documentation

## 2022-10-12 ENCOUNTER — Telehealth: Payer: Self-pay

## 2022-10-12 ENCOUNTER — Telehealth: Payer: Self-pay | Admitting: Critical Care Medicine

## 2022-10-12 NOTE — Telephone Encounter (Signed)
I returned call to Judeth Cornfield, case manager- [employer], message left with call back requested.  Need to further discuss patient's home situation and if the APS referral was just made. We can't place her against her will.    APS had been involved with the patient in September 2023

## 2022-10-12 NOTE — Telephone Encounter (Signed)
FYI

## 2022-10-12 NOTE — Telephone Encounter (Signed)
[facility] with [facility] is calling for Dr. Delford Field to notify that pt has filled an APS report. Home is unfit condition. Pt has pet that she is unable to care for and there is Feces all over the home. Personal care services were recommended by Dr. Delford Field. The agency will stop coming most like due to the conditions of the home that are not sanitary. Pt is having issues managing the home- power was d/c from the home 2 weeks ago. And oxygen for the patient was not used Back up tanks were used and ran out for a few hours.  [employer] assisted with restoration of the power, and gas back on. Gas was out for 4 years.  Pt is having surgery for eyes. Pt will be kept overnight. Pt is not kept well, and many sanitation issues. CB- [phone]

## 2022-10-12 NOTE — Telephone Encounter (Signed)
Erskine Squibb can we see about any support prob needs placement

## 2022-10-12 NOTE — Telephone Encounter (Signed)
I spoke to [employer]. She explained that she had been out to the patient's home last week and the living conditions are poor. [employer] has paid all utility bills and all utilities are now back on.  Judeth Cornfield said she had been out to the patient's home a few months ago and the conditions are worse.   The patient has a dog that she is very attached to and does not want to part with the dog however, Judeth Cornfield reports dog feces all over the house. There has been a PCS aide who has been out to the house but Judeth Cornfield said there is a good chance that the aides may decline going due to the condition of the home.   Judeth Cornfield said that she was not able to reach the patient recently and had to request the police do a welfare check.  The officer who went to the home also reported the poor home conditions and patient's inability to care for herself appropriately.    The patient's poor vision has made it even more difficult to navigate in her home and care for herself. She has eye surgery scheduled for 10/14/2022.  Judeth Cornfield explained that the patient will be kept for 24 hours after the procedure and she plans to contact the hospital case manager/ SW and provide information about the patient's living situation.   Judeth Cornfield said that an APS referral has been placed and I informed her that APS was involved with the patient in September 2023.

## 2022-10-13 NOTE — Telephone Encounter (Signed)
Little I can offer  she needs LTC or ALF  some take pets

## 2022-10-16 ENCOUNTER — Other Ambulatory Visit (HOSPITAL_COMMUNITY): Payer: Self-pay

## 2022-10-28 NOTE — Telephone Encounter (Signed)
Call placed to [facility], care manager/ [facility]  # [phone] to inquire if she has any updates on patient's status, home environment, APS referral. Message left with call back requested.

## 2022-10-28 NOTE — Telephone Encounter (Signed)
Call received from [facility] case manager.  She said that the [place] continues to send aides to assist the patient but they also report poor living conditions.  She is not sure how long the agency will continue to provide the aides. Colletta Maryland also stated that she received a call from [place] regarding the referral she placed and they told her that the patient's living conditions are not poor, at they are not making and recommendations.  Colletta Maryland will follow up with the patient in the next week or two.

## 2022-10-29 ENCOUNTER — Other Ambulatory Visit (HOSPITAL_COMMUNITY): Payer: Self-pay

## 2022-10-29 ENCOUNTER — Other Ambulatory Visit: Payer: Self-pay | Admitting: Critical Care Medicine

## 2022-10-29 DIAGNOSIS — F321 Major depressive disorder, single episode, moderate: Secondary | ICD-10-CM

## 2022-10-29 MED ORDER — SERTRALINE HCL 50 MG PO TABS
50.0000 mg | ORAL_TABLET | Freq: Every day | ORAL | 0 refills | Status: DC
  Filled 2022-10-29: qty 30, 30d supply, fill #0

## 2022-10-29 MED ORDER — GABAPENTIN 300 MG PO CAPS
300.0000 mg | ORAL_CAPSULE | Freq: Every day | ORAL | 1 refills | Status: DC
  Filled 2022-10-29: qty 30, 30d supply, fill #0
  Filled 2023-01-19 – 2023-01-28 (×2): qty 30, 30d supply, fill #1

## 2022-11-03 ENCOUNTER — Other Ambulatory Visit: Payer: Self-pay

## 2022-11-25 ENCOUNTER — Telehealth: Payer: Self-pay | Admitting: Emergency Medicine

## 2022-11-25 NOTE — Telephone Encounter (Signed)
Copied from [place] [phone]. Topic: General - Other >> Nov 25, 2022  3:05 PM Chapman Fitch wrote: Reason for CRM: Pt called and [facility] sent fax today for bed pad supplies and other supplies / please advise if received and signed by Dr. Joya Gaskins if they can get the signature back by Friday they can send pt her order for this month / if not then she will have to wait another 30 days for order/ please advise asap

## 2022-11-30 NOTE — Telephone Encounter (Signed)
Called both numbers on file unable to make contact or leave voicemail  I have not received any paperwork for her regarding this

## 2022-12-07 NOTE — Telephone Encounter (Signed)
Spoke with  patient . Patient reminded apppointment wis on 01/04/2023.Patient also advised that PCP had not entered any pre-visit labs for that day.

## 2022-12-07 NOTE — Telephone Encounter (Signed)
Michele Parrish from [facility]) states that she will re fax the paper work and would like to see if PCP can fill out and fax back for pt.  Please call back: [phone] and use ref number: [id number]   Fax Number: [phone]

## 2022-12-10 ENCOUNTER — Telehealth: Payer: Self-pay | Admitting: Critical Care Medicine

## 2022-12-10 NOTE — Telephone Encounter (Signed)
Copied from [place]. Topic: General - Other >> Dec 10, 2022  3:32 PM Sabas Sous wrote: "Missing primary diagnosis and incontinence code type (ICD 10 codes) must check at least one (required)  Missing signature and date Faxed 12/07/2022 She says the form is blank  The CMN was signed on the wrong spot. You'll find the right spot on the bottom right of the page."  Michele Parrish from [facility] ([place])  Best contact: [phone] Fax: [phone] Reference number: [id number]

## 2022-12-11 NOTE — Telephone Encounter (Signed)
Noted  

## 2022-12-11 NOTE — Telephone Encounter (Signed)
Michele Parrish has a copy it was faxed  Michele Parrish please provide incontinence order form to Michele Parrish

## 2022-12-11 NOTE — Telephone Encounter (Signed)
Opal Sidles please help me out here,  I find these forms exceedingly frustrating and confusing  I will sign a completed form only

## 2022-12-14 NOTE — Telephone Encounter (Signed)
Michele Parrish is calling to follow up on this request. Please advise CB- [phone] reference number [id number] Fax- [phone]

## 2022-12-15 NOTE — Telephone Encounter (Signed)
Called and left vm  °

## 2022-12-15 NOTE — Telephone Encounter (Signed)
[place] twice and left vm

## 2022-12-16 ENCOUNTER — Telehealth: Payer: Self-pay

## 2022-12-16 NOTE — Telephone Encounter (Signed)
Roy from [facility] returned my call.  He explained that the CMN/PA needs patient's recipient ID which I can add. He also said that the provider did not sign the form on the correct line. He needs to sign in the correct spot and then initial and date the signature that is not in the correct spot.  There is also a form for supplies requested that needs to be completed. Carloyn Manner said that because the form was faxed back to them blank, the provider needs to initial and date each entry that is made on that form. I asked why we can't just complete the blank form as it is and he said they will not accept that because they received the blank form first.   Dr Joya Gaskins- I can complete it for you to sign/initial.

## 2022-12-16 NOTE — Telephone Encounter (Signed)
Call placed to [facility] # [phone] to inquire what is needed to complete the order for incontinence supplies as Dr Joya Gaskins has signed the order and clinical notes have been faxed to them ([facility]).  Message left with reimbursement team voicemail, requesting a call back

## 2022-12-30 DIAGNOSIS — H44113 Panuveitis, bilateral: Secondary | ICD-10-CM | POA: Insufficient documentation

## 2023-01-14 NOTE — Telephone Encounter (Signed)
Updated CMN and prescription for supplies faxed to [place]: [phone]

## 2023-01-19 ENCOUNTER — Telehealth: Payer: Self-pay | Admitting: Critical Care Medicine

## 2023-01-19 ENCOUNTER — Other Ambulatory Visit: Payer: Self-pay

## 2023-01-19 ENCOUNTER — Other Ambulatory Visit (HOSPITAL_COMMUNITY): Payer: Self-pay

## 2023-01-19 ENCOUNTER — Other Ambulatory Visit: Payer: Self-pay | Admitting: Critical Care Medicine

## 2023-01-19 DIAGNOSIS — F321 Major depressive disorder, single episode, moderate: Secondary | ICD-10-CM

## 2023-01-19 NOTE — Telephone Encounter (Signed)
Copied from [place] [phone]. Topic: General - Other >> Jan 19, 2023 12:58 PM Ja-Kwan M wrote: Reason for CRM: Pt stated she has a Rx for Depends but she needs a Rx for bed mats for extra protection. Pt stated [facility] will be sending in a request for the Rx and she wanted to make sure the office was aware and looking out for that request.

## 2023-01-20 NOTE — Telephone Encounter (Signed)
Patient is aware that paper work for depends is awaiting for provider to complete

## 2023-01-20 NOTE — Telephone Encounter (Signed)
Pt called in checking status of papwerwork for depends , I let her know they have been put in the [place] box

## 2023-01-20 NOTE — Telephone Encounter (Signed)
Noted paperwork placed in providers box

## 2023-01-22 ENCOUNTER — Other Ambulatory Visit: Payer: Self-pay

## 2023-01-25 ENCOUNTER — Other Ambulatory Visit: Payer: Self-pay

## 2023-01-25 ENCOUNTER — Other Ambulatory Visit (HOSPITAL_COMMUNITY): Payer: Self-pay

## 2023-01-28 ENCOUNTER — Other Ambulatory Visit: Payer: Self-pay

## 2023-02-02 ENCOUNTER — Telehealth: Payer: Self-pay | Admitting: Critical Care Medicine

## 2023-02-02 NOTE — Telephone Encounter (Signed)
Copied from CRM [phone]. Topic: General - Other >> Feb 02, 2023 12:22 PM Everette C wrote: Reason for CRM: Talbert Forest with [facility] has called to follow up on a previously submitted order request from 01/25/23   The request was for ordering incontinent supplies   A copy of the form will be resubmitted 02/02/23  Please respond to [facility] at  Phone: [phone] Fax [phone]

## 2023-02-03 NOTE — Telephone Encounter (Signed)
Paperwork received and place in providers box

## 2023-02-10 ENCOUNTER — Telehealth: Payer: Self-pay | Admitting: Critical Care Medicine

## 2023-02-10 NOTE — Telephone Encounter (Signed)
I called [facility] earlier today. [facility] for [facility] supplies was sent to the patient on 3.27/2024.  I explained that I need to clarify what documents are needed from PCP. We have submitted the CMN and prescription form multiple times.  She said I would need to speak with reimbursement and transferred by call.  I then left a message with reimbursement requesting a call back.  The call was received back from Emm[place].  I returned his call and had to leave another message on reimbursement voicemail requesting a call  back.

## 2023-02-10 NOTE — Telephone Encounter (Signed)
I received a call back from Roy/ [facility].  He said that they did not receive the rx for supplies that was faxed on 01/21/2023, they only received a blank copy of the form. Then he said said that they are not able to clearly read the date the provider signed the form. He also said that they did not receive the CMN.  I explained that was faxed to them on 01/14/2023.  He said they did not receive it, so I said I will refax to them along with rx for supplies. Fax : [phone]

## 2023-02-10 NOTE — Telephone Encounter (Signed)
Copied from CRM [id number] [id number]. Topic: General - Other >> Feb 10, 2023  1:04 PM Santiya F wrote: Reason for CRM: Channing Mutters with [facility] is calling in returning a call from "Erskine Squibb". Please follow up with [phone]

## 2023-02-11 NOTE — Telephone Encounter (Signed)
Updated rx: supply request and CMN faxed to [facility] as requested.

## 2023-02-18 ENCOUNTER — Other Ambulatory Visit: Payer: Self-pay

## 2023-02-18 ENCOUNTER — Emergency Department (HOSPITAL_COMMUNITY): Payer: Medicaid Other

## 2023-02-18 ENCOUNTER — Encounter (HOSPITAL_COMMUNITY): Payer: Self-pay | Admitting: *Deleted

## 2023-02-18 ENCOUNTER — Inpatient Hospital Stay (HOSPITAL_COMMUNITY)
Admission: EM | Admit: 2023-02-18 | Discharge: 2023-02-25 | DRG: 871 | Disposition: A | Discharge status: Home / Self Care | Payer: Medicaid Other | Attending: Internal Medicine | Admitting: Internal Medicine

## 2023-02-18 DIAGNOSIS — E876 Hypokalemia: Secondary | ICD-10-CM | POA: Diagnosis present

## 2023-02-18 DIAGNOSIS — J440 Chronic obstructive pulmonary disease with acute lower respiratory infection: Secondary | ICD-10-CM | POA: Diagnosis present

## 2023-02-18 DIAGNOSIS — Z5900 Homelessness unspecified: Secondary | ICD-10-CM | POA: Diagnosis not present

## 2023-02-18 DIAGNOSIS — Z1152 Encounter for screening for COVID-19: Secondary | ICD-10-CM | POA: Diagnosis not present

## 2023-02-18 DIAGNOSIS — A419 Sepsis, unspecified organism: Secondary | ICD-10-CM | POA: Diagnosis present

## 2023-02-18 DIAGNOSIS — N182 Chronic kidney disease, stage 2 (mild): Secondary | ICD-10-CM | POA: Diagnosis present

## 2023-02-18 DIAGNOSIS — D631 Anemia in chronic kidney disease: Secondary | ICD-10-CM | POA: Diagnosis present

## 2023-02-18 DIAGNOSIS — A630 Anogenital (venereal) warts: Secondary | ICD-10-CM | POA: Diagnosis present

## 2023-02-18 DIAGNOSIS — F17210 Nicotine dependence, cigarettes, uncomplicated: Secondary | ICD-10-CM | POA: Diagnosis present

## 2023-02-18 DIAGNOSIS — I272 Pulmonary hypertension, unspecified: Secondary | ICD-10-CM | POA: Diagnosis present

## 2023-02-18 DIAGNOSIS — Z8701 Personal history of pneumonia (recurrent): Secondary | ICD-10-CM

## 2023-02-18 DIAGNOSIS — J189 Pneumonia, unspecified organism: Secondary | ICD-10-CM | POA: Diagnosis present

## 2023-02-18 DIAGNOSIS — J9621 Acute and chronic respiratory failure with hypoxia: Secondary | ICD-10-CM | POA: Diagnosis present

## 2023-02-18 DIAGNOSIS — I5032 Chronic diastolic (congestive) heart failure: Secondary | ICD-10-CM | POA: Diagnosis present

## 2023-02-18 DIAGNOSIS — H548 Legal blindness, as defined in USA: Secondary | ICD-10-CM | POA: Diagnosis present

## 2023-02-18 DIAGNOSIS — Z7951 Long term (current) use of inhaled steroids: Secondary | ICD-10-CM

## 2023-02-18 DIAGNOSIS — Z90710 Acquired absence of both cervix and uterus: Secondary | ICD-10-CM | POA: Diagnosis not present

## 2023-02-18 DIAGNOSIS — B9781 Human metapneumovirus as the cause of diseases classified elsewhere: Secondary | ICD-10-CM | POA: Diagnosis present

## 2023-02-18 DIAGNOSIS — Z720 Tobacco use: Secondary | ICD-10-CM

## 2023-02-18 DIAGNOSIS — D509 Iron deficiency anemia, unspecified: Secondary | ICD-10-CM | POA: Diagnosis present

## 2023-02-18 DIAGNOSIS — Z8619 Personal history of other infectious and parasitic diseases: Secondary | ICD-10-CM | POA: Diagnosis not present

## 2023-02-18 DIAGNOSIS — D649 Anemia, unspecified: Secondary | ICD-10-CM

## 2023-02-18 DIAGNOSIS — D696 Thrombocytopenia, unspecified: Secondary | ICD-10-CM | POA: Diagnosis present

## 2023-02-18 DIAGNOSIS — R6520 Severe sepsis without septic shock: Secondary | ICD-10-CM | POA: Diagnosis present

## 2023-02-18 DIAGNOSIS — J441 Chronic obstructive pulmonary disease with (acute) exacerbation: Secondary | ICD-10-CM | POA: Diagnosis present

## 2023-02-18 DIAGNOSIS — Z9981 Dependence on supplemental oxygen: Secondary | ICD-10-CM | POA: Diagnosis not present

## 2023-02-18 DIAGNOSIS — R0609 Other forms of dyspnea: Secondary | ICD-10-CM | POA: Diagnosis not present

## 2023-02-18 DIAGNOSIS — F419 Anxiety disorder, unspecified: Secondary | ICD-10-CM | POA: Diagnosis present

## 2023-02-18 DIAGNOSIS — F32A Depression, unspecified: Secondary | ICD-10-CM | POA: Diagnosis present

## 2023-02-18 DIAGNOSIS — Z79899 Other long term (current) drug therapy: Secondary | ICD-10-CM

## 2023-02-18 DIAGNOSIS — J4489 Other specified chronic obstructive pulmonary disease: Secondary | ICD-10-CM | POA: Diagnosis present

## 2023-02-18 DIAGNOSIS — R0602 Shortness of breath: Secondary | ICD-10-CM | POA: Diagnosis present

## 2023-02-18 LAB — FOLATE
Folate: 9.2 ng/mL (ref >5.9)
Folate: 7.9 ng/mL (ref >5.9)

## 2023-02-18 LAB — BASIC METABOLIC PANEL
Anion gap: 12 (ref 5–15)
BUN: 9 mg/dL (ref 6–20)
CO2: 21 mmol/L — ABNORMAL LOW (ref 22–32)
Calcium: 8.1 mg/dL — ABNORMAL LOW (ref 8.9–10.3)
Chloride: 102 mmol/L (ref 98–111)
Creatinine, Ser: 1 mg/dL (ref 0.44–1.00)
GFR, Estimated: >60 mL/min (ref >60)
Glucose, Bld: 150 mg/dL — ABNORMAL HIGH (ref 70–99)
Potassium: 2.8 mmol/L — ABNORMAL LOW (ref 3.5–5.1)
Sodium: 135 mmol/L (ref 135–145)

## 2023-02-18 LAB — CBC WITH DIFFERENTIAL/PLATELET
Abs Immature Granulocytes: 0.02 10*3/uL (ref 0.00–0.07)
Basophils Absolute: 0 10*3/uL (ref 0.0–0.1)
Basophils Relative: 0 %
Eosinophils Absolute: 0 10*3/uL (ref 0.0–0.5)
Eosinophils Relative: 0 %
HCT: 32 % — ABNORMAL LOW (ref 36.0–46.0)
Hemoglobin: 9.7 g/dL — ABNORMAL LOW (ref 12.0–15.0)
Immature Granulocytes: 0 %
Lymphocytes Relative: 29 %
Lymphs Abs: 1.7 10*3/uL (ref 0.7–4.0)
MCH: 27 pg (ref 26.0–34.0)
MCHC: 30.3 g/dL (ref 30.0–36.0)
MCV: 89.1 fL (ref 80.0–100.0)
Monocytes Absolute: 0.4 10*3/uL (ref 0.1–1.0)
Monocytes Relative: 6 %
Neutro Abs: 3.9 10*3/uL (ref 1.7–7.7)
Neutrophils Relative %: 65 %
Platelets: 69 10*3/uL — ABNORMAL LOW (ref 150–400)
RBC: 3.59 MIL/uL — ABNORMAL LOW (ref 3.87–5.11)
RDW: 13.9 % (ref 11.5–15.5)
WBC: 6 10*3/uL (ref 4.0–10.5)
nRBC: 0 % (ref 0.0–0.2)

## 2023-02-18 LAB — IRON AND TIBC
Iron: 7 ug/dL — ABNORMAL LOW (ref 28–170)
Iron: 13 ug/dL — ABNORMAL LOW (ref 28–170)
Saturation Ratios: 2 % — ABNORMAL LOW (ref 10.4–31.8)
Saturation Ratios: 4 % — ABNORMAL LOW (ref 10.4–31.8)
TIBC: 342 ug/dL (ref 250–450)
TIBC: 350 ug/dL (ref 250–450)
UIBC: 335 ug/dL
UIBC: 337 ug/dL

## 2023-02-18 LAB — CREATININE, SERUM
Creatinine, Ser: 0.95 mg/dL (ref 0.44–1.00)
GFR, Estimated: >60 mL/min (ref >60)

## 2023-02-18 LAB — POC OCCULT BLOOD, ED: Fecal Occult Bld: NEGATIVE

## 2023-02-18 LAB — CBC
HCT: 32.3 % — ABNORMAL LOW (ref 36.0–46.0)
Hemoglobin: 9.7 g/dL — ABNORMAL LOW (ref 12.0–15.0)
MCH: 26.6 pg (ref 26.0–34.0)
MCHC: 30 g/dL (ref 30.0–36.0)
MCV: 88.5 fL (ref 80.0–100.0)
Platelets: 78 10*3/uL — ABNORMAL LOW (ref 150–400)
RBC: 3.65 MIL/uL — ABNORMAL LOW (ref 3.87–5.11)
RDW: 13.8 % (ref 11.5–15.5)
WBC: 6.2 10*3/uL (ref 4.0–10.5)
nRBC: 0 % (ref 0.0–0.2)

## 2023-02-18 LAB — RETICULOCYTES
Immature Retic Fract: 9.5 % (ref 2.3–15.9)
Immature Retic Fract: 7.5 % (ref 2.3–15.9)
RBC.: 3.87 MIL/uL (ref 3.87–5.11)
RBC.: 4.3 MIL/uL (ref 3.87–5.11)
Retic Count, Absolute: 22.8 10*3/uL (ref 19.0–186.0)
Retic Count, Absolute: 23.6 10*3/uL (ref 19.0–186.0)
Retic Ct Pct: 0.6 % (ref 0.4–3.1)
Retic Ct Pct: 0.6 % (ref 0.4–3.1)

## 2023-02-18 LAB — BRAIN NATRIURETIC PEPTIDE: B Natriuretic Peptide: 19.7 pg/mL (ref 0.0–100.0)

## 2023-02-18 LAB — MAGNESIUM: Magnesium: 1.9 mg/dL (ref 1.7–2.4)

## 2023-02-18 LAB — VITAMIN B12
Vitamin B-12: 568 pg/mL (ref 180–914)
Vitamin B-12: 651 pg/mL (ref 180–914)

## 2023-02-18 LAB — HIV ANTIBODY (ROUTINE TESTING W REFLEX): HIV Screen 4th Generation wRfx: NONREACTIVE

## 2023-02-18 LAB — FERRITIN
Ferritin: 247 ng/mL (ref 11–307)
Ferritin: 269 ng/mL (ref 11–307)

## 2023-02-18 LAB — LACTIC ACID, PLASMA
Lactic Acid, Venous: 1.5 mmol/L (ref 0.5–1.9)
Lactic Acid, Venous: 1.5 mmol/L (ref 0.5–1.9)

## 2023-02-18 LAB — SARS CORONAVIRUS 2 BY RT PCR: SARS Coronavirus 2 by RT PCR: NEGATIVE

## 2023-02-18 MED ORDER — TIMOLOL MALEATE 0.5 % OP SOLN
1.0000 [drp] | Freq: Two times a day (BID) | OPHTHALMIC | Status: DC
  Administered 2023-02-19: 1 [drp] via OPHTHALMIC
  Filled 2023-02-18: qty 5

## 2023-02-18 MED ORDER — SODIUM CHLORIDE 0.9 % IV SOLN
500.0000 mg | Freq: Once | INTRAVENOUS | Status: AC
  Administered 2023-02-18: 500 mg via INTRAVENOUS
  Filled 2023-02-18: qty 5

## 2023-02-18 MED ORDER — ACETAMINOPHEN 650 MG RE SUPP: 650.0000 mg | Freq: Four times a day (QID) | RECTAL | Status: DC | PRN

## 2023-02-18 MED ORDER — ENOXAPARIN SODIUM 40 MG/0.4ML IJ SOSY
40.0000 mg | PREFILLED_SYRINGE | Freq: Every day | INTRAMUSCULAR | Status: DC
  Administered 2023-02-18 – 2023-02-25 (×8): 40 mg via SUBCUTANEOUS
  Filled 2023-02-18 (×8): qty 0.4

## 2023-02-18 MED ORDER — SODIUM CHLORIDE 0.9 % IV SOLN
1.0000 g | Freq: Once | INTRAVENOUS | Status: AC
  Administered 2023-02-18: 1 g via INTRAVENOUS
  Filled 2023-02-18: qty 10

## 2023-02-18 MED ORDER — ONDANSETRON HCL 4 MG/2ML IJ SOLN: 4.0000 mg | Freq: Four times a day (QID) | INTRAMUSCULAR | Status: DC | PRN

## 2023-02-18 MED ORDER — ACETAMINOPHEN 325 MG PO TABS
650.0000 mg | ORAL_TABLET | Freq: Four times a day (QID) | ORAL | Status: DC | PRN
  Administered 2023-02-21 – 2023-02-24 (×4): 650 mg via ORAL
  Filled 2023-02-18 (×4): qty 2

## 2023-02-18 MED ORDER — ALBUTEROL SULFATE (2.5 MG/3ML) 0.083% IN NEBU: 2.5000 mg | INHALATION_SOLUTION | RESPIRATORY_TRACT | Status: DC | PRN

## 2023-02-18 MED ORDER — PHENOL 1.4 % MT LIQD
1.0000 | OROMUCOSAL | Status: DC | PRN
  Filled 2023-02-18: qty 177

## 2023-02-18 MED ORDER — GABAPENTIN 300 MG PO CAPS
300.0000 mg | ORAL_CAPSULE | Freq: Every day | ORAL | Status: DC
  Administered 2023-02-19: 300 mg via ORAL
  Filled 2023-02-18: qty 1

## 2023-02-18 MED ORDER — METOPROLOL TARTRATE 5 MG/5ML IV SOLN: 5.0000 mg | Freq: Four times a day (QID) | INTRAVENOUS | Status: DC | PRN

## 2023-02-18 MED ORDER — LEVALBUTEROL HCL 1.25 MG/0.5ML IN NEBU
1.2500 mg | INHALATION_SOLUTION | Freq: Once | RESPIRATORY_TRACT | Status: AC
  Administered 2023-02-18: 1.25 mg via RESPIRATORY_TRACT
  Filled 2023-02-18: qty 0.5

## 2023-02-18 MED ORDER — SODIUM CHLORIDE 0.9 % IV SOLN
1.0000 g | INTRAVENOUS | Status: DC
  Administered 2023-02-19 – 2023-02-22 (×4): 1 g via INTRAVENOUS
  Filled 2023-02-18 (×4): qty 10

## 2023-02-18 MED ORDER — TIOTROPIUM BROMIDE MONOHYDRATE 18 MCG IN CAPS: 1.0000 | ORAL_CAPSULE | Freq: Every day | RESPIRATORY_TRACT | Status: DC

## 2023-02-18 MED ORDER — ONDANSETRON HCL 4 MG PO TABS: 4.0000 mg | ORAL_TABLET | Freq: Four times a day (QID) | ORAL | Status: DC | PRN

## 2023-02-18 MED ORDER — ACETAMINOPHEN 650 MG RE SUPP
650.0000 mg | Freq: Once | RECTAL | Status: AC
  Administered 2023-02-18: 650 mg via RECTAL
  Filled 2023-02-18: qty 1

## 2023-02-18 MED ORDER — NICOTINE 14 MG/24HR TD PT24
14.0000 mg | MEDICATED_PATCH | Freq: Every day | TRANSDERMAL | Status: DC
  Administered 2023-02-20 – 2023-02-24 (×5): 14 mg via TRANSDERMAL
  Filled 2023-02-18 (×8): qty 1

## 2023-02-18 MED ORDER — ORAL CARE MOUTH RINSE: 15.0000 mL | OROMUCOSAL | Status: DC | PRN

## 2023-02-18 MED ORDER — UMECLIDINIUM BROMIDE 62.5 MCG/ACT IN AEPB
1.0000 | INHALATION_SPRAY | Freq: Every day | RESPIRATORY_TRACT | Status: DC
  Administered 2023-02-19 – 2023-02-21 (×3): 1 via RESPIRATORY_TRACT
  Filled 2023-02-18: qty 7

## 2023-02-18 MED ORDER — SERTRALINE HCL 50 MG PO TABS
50.0000 mg | ORAL_TABLET | Freq: Every day | ORAL | Status: DC
  Administered 2023-02-19 – 2023-02-25 (×7): 50 mg via ORAL
  Filled 2023-02-18 (×7): qty 1

## 2023-02-18 MED ORDER — CHLORHEXIDINE GLUCONATE CLOTH 2 % EX PADS
6.0000 | MEDICATED_PAD | Freq: Every day | CUTANEOUS | Status: DC
  Administered 2023-02-18 – 2023-02-22 (×4): 6 via TOPICAL

## 2023-02-18 MED ORDER — GUAIFENESIN-DM 100-10 MG/5ML PO SYRP
5.0000 mL | ORAL_SOLUTION | ORAL | Status: DC | PRN
  Administered 2023-02-20 (×2): 5 mL via ORAL
  Filled 2023-02-18 (×2): qty 10

## 2023-02-18 MED ORDER — IPRATROPIUM-ALBUTEROL 0.5-2.5 (3) MG/3ML IN SOLN
3.0000 mL | Freq: Four times a day (QID) | RESPIRATORY_TRACT | Status: DC
  Administered 2023-02-18 – 2023-02-19 (×4): 3 mL via RESPIRATORY_TRACT
  Filled 2023-02-18 (×5): qty 3

## 2023-02-18 MED ORDER — SODIUM CHLORIDE 0.9 % IV SOLN
500.0000 mg | INTRAVENOUS | Status: DC
  Administered 2023-02-19 – 2023-02-22 (×4): 500 mg via INTRAVENOUS
  Filled 2023-02-18 (×4): qty 5

## 2023-02-18 MED ORDER — POTASSIUM CHLORIDE 10 MEQ/100ML IV SOLN
10.0000 meq | INTRAVENOUS | Status: AC
  Administered 2023-02-18: 10 meq via INTRAVENOUS
  Filled 2023-02-18: qty 100

## 2023-02-18 MED ORDER — TRAZODONE HCL 50 MG PO TABS
25.0000 mg | ORAL_TABLET | Freq: Every evening | ORAL | Status: DC | PRN
  Administered 2023-02-19: 25 mg via ORAL
  Filled 2023-02-18: qty 1

## 2023-02-18 NOTE — ED Triage Notes (Addendum)
BIB EMS with 4 days of SHOB, wheezing and dimished breath sounds.  albuterol 0.5 Atrovent, # 20 L hand 125 mg Soulmedrol. Neb tx 86% 8 L [place] prior 76% on 5 l at home CBG 144 120/88-120-38  PT IS BLIND

## 2023-02-18 NOTE — H&P (Signed)
History and Physical  Michele Parrish ZOX:[id number] DOB: 1966-07-12 DOA: 02/18/2023  PCP: Storm Frisk, MD   Chief Complaint: shortness of breath   HPI: Michele Parrish is a 57 y.o. 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  She says that for the last 4 to 5 days she has been quite short of breath, has been coughing, does not think she has been febrile.  She continued to feel short of breath, so her healthcare aide who came to see her this morning called EMS.  When EMS arrived, her O2 saturation was 76% on her home 5 L oxygen.  They gave her 125 mg Solu-Medrol, albuterol and Atrovent nebs, and she is to the emergency department saturating about 80%.  ED Course: In the emergency department, she has had low-grade fever, been tachycardic and tachypneic.  She was placed on BiPAP.  Her hemoglobin was 10 (most recent 14 about 8 months ago), she also has new thrombocytopenia.  Hemoccult negative. Right middle lung shows evidence of possible early pneumonia, she was given IV azithromycin, IV Rocephin, and hospitalist was contacted for admission.  Currently, the patient says that she is feeling quite better, she is very thirsty and wants to see if she can take off the BiPAP.  RT was called and BiPAP was removed in my presence, patient was placed on her baseline 5 L oxygen.  She is saturating about 90% on this, no tachypnea, speaking in full sentences.  Review of Systems: Please see HPI for pertinent positives and negatives. A complete 10 system review of systems are otherwise negative.  Past Medical History:  Diagnosis Date   Acute exacerbation of CHF (congestive heart failure) 02/22/2020   Bronchitis    Bronchopneumonia 09/07/2021   Pericardial effusion    Pulmonary hypertension    Past Surgical History:   Procedure Laterality Date   ABDOMINAL HYSTERECTOMY     EYE SURGERY     RIGHT HEART CATH N/A 02/23/2020   Procedure: RIGHT HEART CATH;  Surgeon: Iran Ouch, MD;  Location: [facility] INVASIVE [facility];  Service: Cardiovascular;  Laterality: N/A;    Social History:  reports that she quit smoking about 3 years ago. Her smoking use included cigarettes. She has a 63.00 pack-year smoking history. She has never used smokeless tobacco. She reports current alcohol use. She reports that she does not use drugs.   No Known Allergies  Family History  Problem Relation Age of Onset   Prostate cancer Father    Stroke Father    Multiple sclerosis Mother      Prior to Admission medications   Medication Sig Start Date End Date Taking? Authorizing Provider  albuterol (VENTOLIN HFA) 108 (90 Base) MCG/ACT inhaler Inhale 2 puffs into the lungs every 4 (four) hours as needed for wheezing or shortness of breath. 06/15/22 07/15/22  Storm Frisk, MD  amoxicillin-clavulanate (AUGMENTIN) 875-125 MG tablet Take 1 tablet by mouth every 12 (twelve) hours. 06/15/22   Storm Frisk, MD  fluticasone-salmeterol (ADVAIR HFA) [id number] MCG/ACT inhaler Inhale 2 puffs into the lungs 2 (two) times daily. 06/15/22 02/27/23  Storm Frisk, MD  furosemide (LASIX) 40 MG tablet Take 1 tablet (40 mg total) by mouth 2 (two) times daily. 06/15/22 07/15/22  Storm Frisk, MD  gabapentin (NEURONTIN) 300 MG capsule Take 1 capsule (300 mg total) by  mouth daily. 10/29/22 02/27/23  Storm Frisk, MD  ipratropium-albuterol (DUONEB) 0.5-2.5 (3) MG/3ML SOLN Take 3 mLs by nebulization every 6 (six) hours as needed. 06/15/22 07/15/22  Storm Frisk, MD  nicotine (NICODERM CQ - DOSED IN MG/24 HOURS) 14 mg/24hr patch Place 1 patch (14 mg total) onto the skin daily. Patient not taking: Reported on 06/15/2022 11/11/21   Claiborne Rigg, NP  sertraline (ZOLOFT) 50 MG tablet Take 1 tablet (50 mg total) by mouth daily. Please make PCP appointment  for more refills. 10/29/22 11/29/22  Storm Frisk, MD  tiotropium (SPIRIVA) 18 MCG inhalation capsule Place 1 capsule (18 mcg total) into inhaler and inhale daily. 06/15/22 02/27/23  Storm Frisk, MD    Physical Exam: BP 126/84 (BP Location: Right Arm)   Pulse (!) 103   Temp (!) 100.7 F (38.2 C) (Oral)   Resp (!) 36   Ht  (1.575 m)   Wt 63.5 kg   SpO2 90%   BMI 25.61 kg/m   General:  Alert, oriented, calm, in no acute distress.  She is blind.  Once BiPAP was removed, she is only minimally tachypneic, speaking in full sentences without cough or respiratory distress. Eyes: EOMI, clear conjuctivae, white sclerea Neck: supple, no masses, trachea mildline  Cardiovascular: RRR, no murmurs or rubs, no peripheral edema  Respiratory: She has equal bilateral air entry, breath sounds are distant, very minimal diffuse rhonchi, no active wheezing, very minimal tachypnea, no retractions or other evidence of respiratory distress.  Speaking in full sentences.   Abdomen: soft, nontender, nondistended, normal bowel tones heard  Skin: dry, no rashes  Musculoskeletal: no joint effusions, normal range of motion  Psychiatric: appropriate affect, normal speech  Neurologic: extraocular muscles intact, clear speech, moving all extremities with intact sensorium          Labs on Admission:  Basic Metabolic Panel: Recent Labs  Lab 02/18/23 1049  NA 135  K 2.8*  CL 102  CO2 21*  GLUCOSE 150*  BUN 9  CREATININE 1.00  CALCIUM 8.1*  MG 1.9   Liver Function Tests: No results for input(s): "AST", "ALT", "ALKPHOS", "BILITOT", "PROT", "ALBUMIN" in the last 168 hours. No results for input(s): "LIPASE", "AMYLASE" in the last 168 hours. No results for input(s): "AMMONIA" in the last 168 hours. CBC: Recent Labs  Lab 02/18/23 1049  WBC 6.0  NEUTROABS 3.9  HGB 9.7*  HCT 32.0*  MCV 89.1  PLT 69*   Cardiac Enzymes: No results for input(s): "CKTOTAL", "CKMB", "CKMBINDEX", "TROPONINI" in the  last 168 hours.  BNP (last 3 results) Recent Labs    02/18/23 1049  BNP 19.7    ProBNP (last 3 results) No results for input(s): "PROBNP" in the last 8760 hours.  CBG: No results for input(s): "GLUCAP" in the last 168 hours.  Radiological Exams on Admission: DG Chest Port 1 View  Result Date: 02/18/2023 CLINICAL DATA:  Shortness of breath EXAM: PORTABLE CHEST 1 VIEW COMPARISON:  X-ray 09/06/2021 and older FINDINGS: Hyperinflation. Tiny right effusion. No left effusion. No pneumothorax. Diffuse interstitial changes. There is some bandlike changes of the left lung base. Atelectasis or scar is favored. There is new ill-defined confluence opacity in the mid right hemithorax. Acute infiltrate is possible. Please correlate for signs of pneumonia recommend follow-up. Normal cardiopericardial silhouette. Overlapping cardiac leads. IMPRESSION: Developing ill-defined parenchymal opacity in the right midlung. Possible infiltrate or pneumonia. Recommend follow-up. Tiny right effusion. Hyperinflation with chronic changes Electronically Signed  By: Karen Kays M.D.   On: 02/18/2023 10:46    Assessment/Plan She is meeting SIRS criteria with low-grade fever, tachycardia, tachypnea and source is community-acquired pneumonia.  This is also caused acute exacerbation of COPD, and acute on chronic hypoxic respiratory failure.  Lactate is pending. -Inpatient admission to stepdown unit -Continue BiPAP support for work of breathing, and can wean as tolerated -Continue oxygen supplementation to keep O2 saturation 90% or above. -Continue steroids in the form of oral prednisone starting in the morning -Empiric IV azithromycin and Rocephin -No history of QT prolongation, QTc less than 500 this morning will monitor with daily EKG -Scheduled DuoNebs, and as needed albuterol inhaler  Anemia-unclear etiology, no report of melena, or other evidence of bleeding.  Hemoccult checked in the ER and was negative. -Check  anemia panel -Follow daily CBC  Active Problems:   COPD with chronic bronchitis   Chronic diastolic heart failure-continue home medications, currently no evidence of fluid overload   CKD (chronic kidney disease), stage II   Acute on chronic respiratory failure with hypoxia   Depression  DVT prophylaxis: Lovenox     Code Status: Full Code  Consults called: None  Admission status: The appropriate patient status for this patient is INPATIENT. Inpatient status is judged to be reasonable and necessary in order to provide the required intensity of service to ensure the patient's safety. The patient's presenting symptoms, physical exam findings, and initial radiographic and laboratory data in the context of their chronic comorbidities is felt to place them at high risk for further clinical deterioration. Furthermore, it is not anticipated that the patient will be medically stable for discharge from the hospital within 2 midnights of admission.    I certify that at the point of admission it is my clinical judgment that the patient will require inpatient hospital care spanning beyond 2 midnights from the point of admission due to high intensity of service, high risk for further deterioration and high frequency of surveillance required   Time spent: 59 minutes  Parilee Hally Sharlette Dense MD [facility] Pager [phone]  If 7PM-7AM, please contact night-coverage [email or web address] Password [facility]  02/18/2023, 2:11 PM

## 2023-02-18 NOTE — Progress Notes (Signed)
Removed PT from BiPAP and placed on 6 LPM 02 (uses 5-6  LPM at home). PT is able to speak in complete sentences and does not appear to be in respiratory distress at this time. Discussed (with PT and Paramedic) the need to reapply BiPAP for any noticeable change in breathing - to please reach out to RT.

## 2023-02-18 NOTE — ED Notes (Signed)
ED TO INPATIENT HANDOFF REPORT  Name/Age/Gender Michele Parrish 57 y.o. female  Code Status    Code Status Orders  (From admission, onward)           Start     Ordered   02/18/23 1301  Full code  Continuous       Question:  By:  Answer:  Consent: discussion documented in EHR   02/18/23 1301           Code Status History     Date Active Date Inactive Code Status Order ID Comments User Context   09/06/2021 1328 09/11/2021 2041 DNR [id number]  Teddy Spike, DO Inpatient   02/21/2020 1247 02/28/2020 2249 Full Code [id number]  Joycelyn Das, MD ED       Home/SNF/Other Home  Chief Complaint CAP (community acquired pneumonia) [J18.9]  Level of Care/Admitting Diagnosis ED Disposition     ED Disposition  Admit   Condition  --   Comment  Hospital Area: [facility] ([facility]) [[id number]]  Level of Care: Stepdown [14]  Admit to SDU based on following criteria: Respiratory Distress:  Frequent assessment and/or intervention to maintain adequate ventilation/respiration, pulmonary toilet, and respiratory treatment.  May admit patient to [name] or [facility] if equivalent level of care is available:: Yes  Covid Evaluation: Confirmed COVID Negative  Diagnosis: CAP (community acquired pneumonia) [562130]  Admitting Physician: Maryln Gottron [[id number]]  Attending Physician: Kirby Crigler, MIR [email or web address] [[id number]]  Certification:: I certify this patient will need inpatient services for at least 2 midnights  Estimated Length of Stay: 3          Medical History Past Medical History:  Diagnosis Date   Acute exacerbation of CHF (congestive heart failure) 02/22/2020   Bronchitis    Bronchopneumonia 09/07/2021   Pericardial effusion    Pulmonary hypertension     Allergies No Known Allergies  IV Location/Drains/Wounds Patient Lines/Drains/Airways Status     Active Line/Drains/Airways     Name Placement date Placement time Site Days   Peripheral IV 02/18/23 20 G  1" Left;Posterior Hand 02/18/23  1042  Hand  less than 1   Peripheral IV 02/18/23 20 G 1" Anterior;Distal;Right Forearm 02/18/23  1141  Forearm  less than 1            Labs/Imaging Results for orders placed or performed during the hospital encounter of 02/18/23 (from the past 48 hour(s))  Basic metabolic panel     Status: Abnormal   Collection Time: 02/18/23 10:49 AM  Result Value Ref Range   Sodium 135 135 - 145 mmol/L   Potassium 2.8 (L) 3.5 - 5.1 mmol/L   Chloride 102 98 - 111 mmol/L   CO2 21 (L) 22 - 32 mmol/L   Glucose, Bld 150 (H) 70 - 99 mg/dL    Comment: Glucose reference range applies only to samples taken after fasting for at least 8 hours.   BUN 9 6 - 20 mg/dL   Creatinine, Ser 4.13 0.44 - 1.00 mg/dL   Calcium 8.1 (L) 8.9 - 10.3 mg/dL   GFR, Estimated >24 >40 mL/min    Comment: (NOTE) Calculated using the CKD-EPI Creatinine Equation (2021)    Anion gap 12 5 - 15    Comment: Performed at [facility], [address] [place], [place] [address]  Brain natriuretic peptide     Status: None   Collection Time: 02/18/23 10:49 AM  Result Value Ref Range   B Natriuretic Peptide 19.7 0.0 -  100.0 pg/mL    Comment: Performed at [facility], [address], [place], [place] [address]  CBC with Differential     Status: Abnormal   Collection Time: 02/18/23 10:49 AM  Result Value Ref Range   WBC 6.0 4.0 - 10.5 K/uL   RBC 3.59 (L) 3.87 - 5.11 MIL/uL   Hemoglobin 9.7 (L) 12.0 - 15.0 g/dL   HCT 60.4 (L) 54.0 - 98.1 %   MCV 89.1 80.0 - 100.0 fL   MCH 27.0 26.0 - 34.0 pg   MCHC 30.3 30.0 - 36.0 g/dL   RDW 19.1 47.8 - 29.5 %   Platelets 69 (L) 150 - 400 K/uL    Comment: SPECIMEN CHECKED FOR CLOTS Immature Platelet Fraction may be clinically indicated, consider ordering this additional test [id number] REPEATED TO VERIFY PLATELET COUNT CONFIRMED BY SMEAR    nRBC 0.0 0.0 - 0.2 %   Neutrophils Relative % 65 %   Neutro Abs 3.9 1.7 - 7.7  K/uL   Lymphocytes Relative 29 %   Lymphs Abs 1.7 0.7 - 4.0 K/uL   Monocytes Relative 6 %   Monocytes Absolute 0.4 0.1 - 1.0 K/uL   Eosinophils Relative 0 %   Eosinophils Absolute 0.0 0.0 - 0.5 K/uL   Basophils Relative 0 %   Basophils Absolute 0.0 0.0 - 0.1 K/uL   Immature Granulocytes 0 %   Abs Immature Granulocytes 0.02 0.00 - 0.07 K/uL    Comment: Performed at [facility], [address], [place], [place] [address]  Magnesium     Status: None   Collection Time: 02/18/23 10:49 AM  Result Value Ref Range   Magnesium 1.9 1.7 - 2.4 mg/dL    Comment: Performed at [facility], [address], [place], [place] [address]  Lactic acid, plasma     Status: None   Collection Time: 02/18/23 10:49 AM  Result Value Ref Range   Lactic Acid, Venous 1.5 0.5 - 1.9 mmol/L    Comment: Performed at [facility], [address], [place], [place] [address]  SARS Coronavirus 2 by RT PCR (hospital order, performed in [facility] hospital lab) *cepheid single result test* Anterior Nasal Swab     Status: None   Collection Time: 02/18/23 10:53 AM   Specimen: Anterior Nasal Swab  Result Value Ref Range   SARS Coronavirus 2 by RT PCR NEGATIVE NEGATIVE    Comment: (NOTE) SARS-CoV-2 target nucleic acids are NOT DETECTED.  The SARS-CoV-2 RNA is generally detectable in upper and lower respiratory specimens during the acute phase of infection. The lowest concentration of SARS-CoV-2 viral copies this assay can detect is 250 copies / mL. A negative result does not preclude SARS-CoV-2 infection and should not be used as the sole basis for treatment or other patient management decisions.  A negative result may occur with improper specimen collection / handling, submission of specimen other than nasopharyngeal swab, presence of viral mutation(s) within the areas targeted by this assay, and inadequate number of viral copies (<250 copies / mL). A negative  result must be combined with clinical observations, patient history, and epidemiological information.  Fact Sheet for Patients:   RoadLapTop.co.za  Fact Sheet for Healthcare Providers: http://kim-miller.com/  This test is not yet approved or  cleared by the [place] FDA and has been authorized for detection and/or diagnosis of SARS-CoV-2 by FDA under an Emergency Use Authorization (EUA).  This EUA will remain in effect (meaning this test can  be used) for the duration of the COVID-19 declaration under Section 564(b)(1) of the Act, 21 U.S.C. section 360bbb-3(b)(1), unless the authorization is terminated or revoked sooner.  Performed at [facility], [address], [place], [place] [address]   POC occult blood, ED Provider will collect     Status: None   Collection Time: 02/18/23 11:56 AM  Result Value Ref Range   Fecal Occult Bld NEGATIVE NEGATIVE  Vitamin B12     Status: None   Collection Time: 02/18/23  1:56 PM  Result Value Ref Range   Vitamin B-12 568 180 - 914 pg/mL    Comment: (NOTE) This assay is not validated for testing neonatal or myeloproliferative syndrome specimens for Vitamin B12 levels. Performed at [facility], [address] [place], [place] [address]   Iron and TIBC     Status: Abnormal   Collection Time: 02/18/23  1:56 PM  Result Value Ref Range   Iron 7 (L) 28 - 170 ug/dL   TIBC 098 119 - 147 ug/dL   Saturation Ratios 2 (L) 10.4 - 31.8 %   UIBC 335 ug/dL    Comment: Performed at [facility], [address], [place], [place] [address]  Ferritin     Status: None   Collection Time: 02/18/23  1:56 PM  Result Value Ref Range   Ferritin 247 11 - 307 ng/mL    Comment: Performed at [facility], [address] [place], [place] [address]  Folate     Status: None   Collection Time: 02/18/23  1:57 PM  Result Value Ref Range   Folate  9.2 >5.9 ng/mL    Comment: Performed at [facility], [address], [place], [place] [address]  Reticulocytes     Status: None   Collection Time: 02/18/23  1:57 PM  Result Value Ref Range   Retic Ct Pct 0.6 0.4 - 3.1 %   RBC. 3.87 3.87 - 5.11 MIL/uL   Retic Count, Absolute 22.8 19.0 - 186.0 K/uL   Immature Retic Fract 9.5 2.3 - 15.9 %    Comment: Performed at [facility], [address] [place], [place] [address]  CBC     Status: Abnormal   Collection Time: 02/18/23  1:57 PM  Result Value Ref Range   WBC 6.2 4.0 - 10.5 K/uL   RBC 3.65 (L) 3.87 - 5.11 MIL/uL   Hemoglobin 9.7 (L) 12.0 - 15.0 g/dL   HCT 52.8 (L) 41.3 - 24.4 %   MCV 88.5 80.0 - 100.0 fL   MCH 26.6 26.0 - 34.0 pg   MCHC 30.0 30.0 - 36.0 g/dL   RDW 01.0 27.2 - 53.6 %   Platelets 78 (L) 150 - 400 K/uL    Comment: Immature Platelet Fraction may be clinically indicated, consider ordering this additional test [id number] CONSISTENT WITH PREVIOUS RESULT    nRBC 0.0 0.0 - 0.2 %    Comment: Performed at [facility], [address], [place], [place] [address]  Creatinine, serum     Status: None   Collection Time: 02/18/23  1:57 PM  Result Value Ref Range   Creatinine, Ser 0.95 0.44 - 1.00 mg/dL   GFR, Estimated >38 >75 mL/min    Comment: (NOTE) Calculated using the CKD-EPI Creatinine Equation (2021) Performed at [facility], [address] [place], [place] [address]   Lactic acid, plasma     Status: None  Collection Time: 02/18/23  1:58 PM  Result Value Ref Range   Lactic Acid, Venous 1.5 0.5 - 1.9 mmol/L    Comment: Performed at [facility], [address], [place], [place] [address]   DG Chest Port 1 View  Result Date: 02/18/2023 CLINICAL DATA:  Shortness of breath EXAM: PORTABLE CHEST 1 VIEW COMPARISON:  X-ray 09/06/2021 and older FINDINGS: Hyperinflation. Tiny right effusion. No left effusion. No pneumothorax.  Diffuse interstitial changes. There is some bandlike changes of the left lung base. Atelectasis or scar is favored. There is new ill-defined confluence opacity in the mid right hemithorax. Acute infiltrate is possible. Please correlate for signs of pneumonia recommend follow-up. Normal cardiopericardial silhouette. Overlapping cardiac leads. IMPRESSION: Developing ill-defined parenchymal opacity in the right midlung. Possible infiltrate or pneumonia. Recommend follow-up. Tiny right effusion. Hyperinflation with chronic changes Electronically Signed   By: Karen Kays M.D.   On: 02/18/2023 10:46    Pending Labs Unresulted Labs (From admission, onward)     Start     Ordered   02/25/23 0500  Creatinine, serum  (enoxaparin (LOVENOX)    CrCl >/= 30 ml/min)  Weekly,   R     Comments: while on enoxaparin therapy    02/18/23 1301   02/19/23 0500  Basic metabolic panel  Tomorrow morning,   R        02/18/23 1301   02/19/23 0500  CBC  Tomorrow morning,   R        02/18/23 1301   02/18/23 1421  Vitamin B12  (Anemia Panel (PNL))  Once,   R        02/18/23 1420   02/18/23 1421  Folate  (Anemia Panel (PNL))  Once,   R        02/18/23 1420   02/18/23 1421  Iron and TIBC  (Anemia Panel (PNL))  Once,   R        02/18/23 1420   02/18/23 1421  Ferritin  (Anemia Panel (PNL))  Once,   R        02/18/23 1420   02/18/23 1421  Reticulocytes  (Anemia Panel (PNL))  Once,   R        02/18/23 1420   02/18/23 1300  HIV Antibody (routine testing w rflx)  (HIV Antibody (Routine testing w reflex) panel)  Once,   R        02/18/23 1301   02/18/23 1114  Culture, blood (routine x 2)  BLOOD CULTURE X 2,   R (with STAT occurrences)      02/18/23 1113            Vitals/Pain Today's Vitals   02/18/23 1615 02/18/23 1630 02/18/23 1645 02/18/23 1650  BP: 119/75  (!) 127/108   Pulse: 98 97 96   Resp: (!) 21 (!) 24 (!) 28   Temp:      TempSrc:      SpO2: 90% 91%  92%  Weight:      Height:      PainSc:         Isolation Precautions Airborne and Contact precautions  Medications Medications  potassium chloride 10 mEq in 100 mL IVPB (0 mEq Intravenous Stopped 02/18/23 1503)  cefTRIAXone (ROCEPHIN) 1 g in sodium chloride 0.9 % 100 mL IVPB (has no administration in time range)  azithromycin (ZITHROMAX) 500 mg in sodium chloride 0.9 % 250 mL IVPB (has no administration in time range)  ipratropium-albuterol (DUONEB) 0.5-2.5 (3) MG/3ML nebulizer solution  3 mL (3 mLs Nebulization Given 02/18/23 1605)  sertraline (ZOLOFT) tablet 50 mg (has no administration in time range)  gabapentin (NEURONTIN) capsule 300 mg (has no administration in time range)  tiotropium (SPIRIVA) inhalation capsule ([facility] use ONLY) 18 mcg (18 mcg Inhalation Not Given 02/18/23 1549)  enoxaparin (LOVENOX) injection 40 mg (40 mg Subcutaneous Given 02/18/23 1359)  acetaminophen (TYLENOL) tablet 650 mg (has no administration in time range)    Or  acetaminophen (TYLENOL) suppository 650 mg (has no administration in time range)  traZODone (DESYREL) tablet 25 mg (has no administration in time range)  ondansetron (ZOFRAN) tablet 4 mg (has no administration in time range)    Or  ondansetron (ZOFRAN) injection 4 mg (has no administration in time range)  albuterol (PROVENTIL) (2.5 MG/3ML) 0.083% nebulizer solution 2.5 mg (has no administration in time range)  metoprolol tartrate (LOPRESSOR) injection 5 mg (has no administration in time range)  nicotine (NICODERM CQ - dosed in mg/24 hours) patch 14 mg (14 mg Transdermal Patient Refused/Not Given 02/18/23 1553)  timolol (TIMOPTIC) 0.5 % ophthalmic solution 1 drop (has no administration in time range)  levalbuterol (XOPENEX) nebulizer solution 1.25 mg (1.25 mg Nebulization Given 02/18/23 1106)  acetaminophen (TYLENOL) suppository 650 mg (650 mg Rectal Given 02/18/23 1200)  cefTRIAXone (ROCEPHIN) 1 g in sodium chloride 0.9 % 100 mL IVPB (0 g Intravenous Stopped 02/18/23 1302)  azithromycin (ZITHROMAX)  500 mg in sodium chloride 0.9 % 250 mL IVPB (0 mg Intravenous Stopped 02/18/23 1429)    Mobility walks

## 2023-02-18 NOTE — Progress Notes (Signed)
   02/18/23 1055  BiPAP/CPAP/SIPAP  $ Non-Invasive Ventilator  Non-Invasive Vent Initial  $ Face Mask Small Yes  BiPAP/CPAP/SIPAP Pt Type Adult  BiPAP/CPAP/SIPAP V60  Mask Type Full face mask  Set Rate 12 breaths/min  Respiratory Rate 36 breaths/min  Flow Rate 1.1 lpm  Patient Home Equipment No  Auto Titrate No  Press High Alarm 50 cmH2O  Press Low Alarm 2 cmH2O  Nasal massage performed No (comment)  CPAP/SIPAP surface wiped down Yes  Oxygen Percent 50 %  BiPAP/CPAP /SiPAP Vitals  Pulse Rate (!) 103  Resp (!) 36  SpO2 (!) 89 %  Bilateral Breath Sounds Expiratory wheezes;Diminished  MEWS Score/Color  MEWS Score 5  MEWS Score Color Red   BIPAP: 12/6 initiated.

## 2023-02-18 NOTE — ED Provider Notes (Signed)
[place] EMERGENCY DEPARTMENT AT [facility] Provider Note   CSN: [id number] Arrival date & time: 02/18/23  1018     History  Chief Complaint  Patient presents with   Shortness of Breath    Michele Parrish is a 57 y.o. female.  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  Pt has been sob for 4 days.  She's been using her nebs without improvement.  She felt really bad this am, so she called EMS.  When EMS arrived, her O2 sat was 76% on her normal 5L.  They gave her 125 mg solumedrol, neb tx (10 mg albuterol and 0.5 mg atrovent).  She is saturating in the low 80s upon arrival here.         Home Medications Prior to Admission medications   Medication Sig Start Date End Date Taking? Authorizing Provider  albuterol (VENTOLIN HFA) 108 (90 Base) MCG/ACT inhaler Inhale 2 puffs into the lungs every 4 (four) hours as needed for wheezing or shortness of breath. 06/15/22 07/15/22  Storm Frisk, MD  amoxicillin-clavulanate (AUGMENTIN) 875-125 MG tablet Take 1 tablet by mouth every 12 (twelve) hours. 06/15/22   Storm Frisk, MD  fluticasone-salmeterol (ADVAIR HFA) 325 375 2999 MCG/ACT inhaler Inhale 2 puffs into the lungs 2 (two) times daily. 06/15/22 02/27/23  Storm Frisk, MD  furosemide (LASIX) 40 MG tablet Take 1 tablet (40 mg total) by mouth 2 (two) times daily. 06/15/22 07/15/22  Storm Frisk, MD  gabapentin (NEURONTIN) 300 MG capsule Take 1 capsule (300 mg total) by mouth daily. 10/29/22 02/27/23  Storm Frisk, MD  ipratropium-albuterol (DUONEB) 0.5-2.5 (3) MG/3ML SOLN Take 3 mLs by nebulization every 6 (six) hours as needed. 06/15/22 07/15/22  Storm Frisk, MD  nicotine (NICODERM CQ - DOSED IN MG/24 HOURS) 14 mg/24hr patch Place 1 patch (14 mg total) onto the skin daily. Patient not taking: Reported on  06/15/2022 11/11/21   Claiborne Rigg, NP  sertraline (ZOLOFT) 50 MG tablet Take 1 tablet (50 mg total) by mouth daily. Please make PCP appointment for more refills. 10/29/22 11/29/22  Storm Frisk, MD  tiotropium (SPIRIVA) 18 MCG inhalation capsule Place 1 capsule (18 mcg total) into inhaler and inhale daily. 06/15/22 02/27/23  Storm Frisk, MD      Allergies    Patient has no known allergies.    Review of Systems   Review of Systems  Respiratory:  Positive for cough, shortness of breath and wheezing.   All other systems reviewed and are negative.   Physical Exam Updated Vital Signs BP 126/84 (BP Location: Right Arm)   Pulse (!) 103   Temp (!) 100.7 F (38.2 C) (Oral)   Resp (!) 36   Ht 5\' 2"  (1.575 m)   Wt 63.5 kg   SpO2 90%   BMI 25.61 kg/m  Physical Exam Vitals and nursing note reviewed. Exam conducted with a chaperone present.  Constitutional:      General: She is in acute distress.     Appearance: She is well-developed. She is ill-appearing.  HENT:     Head: Normocephalic and atraumatic.     Mouth/Throat:     Mouth: Mucous membranes are moist.     Pharynx: Oropharynx is clear.  Eyes:     Comments: Right eye blind.  Left  eye with some vision.  She can see shapes, but not well.  Cardiovascular:     Rate and Rhythm: Regular rhythm. Tachycardia present.  Pulmonary:     Effort: Tachypnea present.     Breath sounds: Decreased breath sounds, wheezing and rhonchi present.  Abdominal:     General: Bowel sounds are normal.     Palpations: Abdomen is soft.  Genitourinary:    Rectum: Guaiac result negative.     Comments: Large condylomata acuminata to anus Musculoskeletal:        General: Normal range of motion.  Skin:    General: Skin is warm.     Capillary Refill: Capillary refill takes less than 2 seconds.  Neurological:     General: No focal deficit present.     Mental Status: She is alert and oriented to person, place, and time.  Psychiatric:        Mood and  Affect: Mood normal.        Behavior: Behavior normal.     ED Results / Procedures / Treatments   Labs (all labs ordered are listed, but only abnormal results are displayed) Labs Reviewed  BASIC METABOLIC PANEL - Abnormal; Notable for the following components:      Result Value   Potassium 2.8 (*)    CO2 21 (*)    Glucose, Bld 150 (*)    Calcium 8.1 (*)    All other components within normal limits  CBC WITH DIFFERENTIAL/PLATELET - Abnormal; Notable for the following components:   RBC 3.59 (*)    Hemoglobin 9.7 (*)    HCT 32.0 (*)    Platelets 69 (*)    All other components within normal limits  SARS CORONAVIRUS 2 BY RT PCR  CULTURE, BLOOD (ROUTINE X 2)  CULTURE, BLOOD (ROUTINE X 2)  BRAIN NATRIURETIC PEPTIDE  MAGNESIUM  LACTIC ACID, PLASMA  LACTIC ACID, PLASMA  VITAMIN B12  FOLATE  IRON AND TIBC  FERRITIN  RETICULOCYTES  POC OCCULT BLOOD, ED    EKG EKG Interpretation  Date/Time:  Thursday February 18 2023 10:32:56 EDT Ventricular Rate:  123 PR Interval:  157 QRS Duration: 134 QT Interval:  337 QTC Calculation: 483 R Axis:   25 Text Interpretation: Sinus tachycardia Left atrial enlargement Left bundle branch block Since last tracing rate faster Confirmed by Jacalyn Lefevre [id number]) on 02/18/2023 11:14:40 AM  Radiology DG Chest Port 1 View  Result Date: 02/18/2023 CLINICAL DATA:  Shortness of breath EXAM: PORTABLE CHEST 1 VIEW COMPARISON:  X-ray 09/06/2021 and older FINDINGS: Hyperinflation. Tiny right effusion. No left effusion. No pneumothorax. Diffuse interstitial changes. There is some bandlike changes of the left lung base. Atelectasis or scar is favored. There is new ill-defined confluence opacity in the mid right hemithorax. Acute infiltrate is possible. Please correlate for signs of pneumonia recommend follow-up. Normal cardiopericardial silhouette. Overlapping cardiac leads. IMPRESSION: Developing ill-defined parenchymal opacity in the right midlung. Possible  infiltrate or pneumonia. Recommend follow-up. Tiny right effusion. Hyperinflation with chronic changes Electronically Signed   By: Karen Kays M.D.   On: 02/18/2023 10:46    Procedures Procedures    Medications Ordered in ED Medications  acetaminophen (TYLENOL) suppository 650 mg (has no administration in time range)  azithromycin (ZITHROMAX) 500 mg in sodium chloride 0.9 % 250 mL IVPB (has no administration in time range)  potassium chloride 10 mEq in 100 mL IVPB (has no administration in time range)  levalbuterol (XOPENEX) nebulizer solution 1.25 mg (1.25 mg Nebulization Given 02/18/23 1106)  cefTRIAXone (ROCEPHIN) 1 g in sodium chloride 0.9 % 100 mL IVPB (1 g Intravenous New Bag/Given 02/18/23 1140)    ED Course/ Medical Decision Making/ A&P                             Medical Decision Making Amount and/or Complexity of Data Reviewed Labs: ordered. Radiology: ordered.  Risk OTC drugs. Prescription drug management. Decision regarding hospitalization.   This patient presents to the ED for concern of sob, this involves an extensive number of treatment options, and is a complaint that carries with it a high risk of complications and morbidity.  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  The pertinent results include:  cbc with hgb 9.7 (down from 14.5 in August), covid neg, mg nl at 1.9, bmp with k low at 2.8   Imaging Studies ordered:  I ordered imaging studies including cxr  I independently visualized and interpreted imaging  which showed  Developing ill-defined parenchymal opacity in the right midlung.  Possible infiltrate or pneumonia. Recommend follow-up.    Tiny right effusion.    Hyperinflation with chronic changes   I agree with the radiologist interpretation   Cardiac Monitoring:  The patient was maintained on a cardiac monitor.  I personally viewed and interpreted the cardiac monitored which showed an underlying rhythm of: st   Medicines ordered and prescription drug management:  I ordered medication including nebs/bipap  for sob; rocephin/zithromax for pna  Reevaluation of the patient after these medicines showed that the patient improved I have reviewed the patients home medicines and have made adjustments as needed   Test Considered:  ct   Critical Interventions:  bipap   Consultations Obtained:  I requested consultation with the hospitalist (Dr. Kirby Crigler),  and discussed lab and imaging findings as well as pertinent plan -he will admit   Problem List / ED Course:  COPD exac:  pt placed on bipap and O2 sats have improved and she looks much better.   RML pna:  rocephin/zithromax given Anemia:  large drop in hgb from August (14.5) to now (9.7).  She is guaiac neg, but she will need a GI work up as an outpatient.  Anemia panel ordered.  She has no hx of anemia. Condyloma acuminata:  pt reports that these are known to her. Hypokalemia:  IV KCl ordered   Reevaluation:  After the interventions noted above, I reevaluated the patient and found that they have :improved   Social Determinants of Health:  Lives at home   Dispostion:  After consideration of the diagnostic results and the patients response to treatment, I feel that the patent would benefit from admission.    CRITICAL CARE Performed by: Jacalyn Lefevre   Total critical care time: 30 minutes  Critical care time was exclusive of separately billable procedures and treating other patients.  Critical care was  necessary to treat or prevent imminent or life-threatening deterioration.  Critical care was time spent personally by me on the following activities: development of treatment plan with patient  and/or surrogate as well as nursing, discussions with consultants, evaluation of patient's response to treatment, examination of patient, obtaining history from patient or surrogate, ordering and performing treatments and interventions, ordering and review of laboratory studies, ordering and review of radiographic studies, pulse oximetry and re-evaluation of patient's condition.         Final Clinical Impression(s) / ED Diagnoses Final diagnoses:  Community acquired pneumonia of right middle lobe of lung  COPD exacerbation  Anemia, unspecified type  Condyloma acuminatum of anus  Hypokalemia    Rx / DC Orders ED Discharge Orders     None         Jacalyn Lefevre, MD 02/18/23 1239

## 2023-02-19 DIAGNOSIS — A419 Sepsis, unspecified organism: Secondary | ICD-10-CM | POA: Diagnosis not present

## 2023-02-19 DIAGNOSIS — J189 Pneumonia, unspecified organism: Secondary | ICD-10-CM | POA: Diagnosis not present

## 2023-02-19 LAB — CBC
HCT: 34.2 % — ABNORMAL LOW (ref 36.0–46.0)
Hemoglobin: 10.5 g/dL — ABNORMAL LOW (ref 12.0–15.0)
MCH: 27.1 pg (ref 26.0–34.0)
MCHC: 30.7 g/dL (ref 30.0–36.0)
MCV: 88.1 fL (ref 80.0–100.0)
Platelets: 92 10*3/uL — ABNORMAL LOW (ref 150–400)
RBC: 3.88 MIL/uL (ref 3.87–5.11)
RDW: 14 % (ref 11.5–15.5)
WBC: 6.4 10*3/uL (ref 4.0–10.5)
nRBC: 0 % (ref 0.0–0.2)

## 2023-02-19 LAB — BASIC METABOLIC PANEL
Anion gap: 8 (ref 5–15)
BUN: 18 mg/dL (ref 6–20)
CO2: 26 mmol/L (ref 22–32)
Calcium: 8.4 mg/dL — ABNORMAL LOW (ref 8.9–10.3)
Chloride: 103 mmol/L (ref 98–111)
Creatinine, Ser: 0.78 mg/dL (ref 0.44–1.00)
GFR, Estimated: >60 mL/min (ref >60)
Glucose, Bld: 115 mg/dL — ABNORMAL HIGH (ref 70–99)
Potassium: 3.5 mmol/L (ref 3.5–5.1)
Sodium: 137 mmol/L (ref 135–145)

## 2023-02-19 LAB — C DIFFICILE QUICK SCREEN W PCR REFLEX
C Diff antigen: NEGATIVE
C Diff interpretation: NOT DETECTED
C Diff toxin: NEGATIVE

## 2023-02-19 LAB — CULTURE, BLOOD (ROUTINE X 2): Special Requests: ADEQUATE

## 2023-02-19 LAB — LACTIC ACID, PLASMA: Lactic Acid, Venous: 1.9 mmol/L (ref 0.5–1.9)

## 2023-02-19 MED ORDER — ARFORMOTEROL TARTRATE 15 MCG/2ML IN NEBU
15.0000 ug | INHALATION_SOLUTION | Freq: Two times a day (BID) | RESPIRATORY_TRACT | Status: DC
  Administered 2023-02-19 – 2023-02-25 (×13): 15 ug via RESPIRATORY_TRACT
  Filled 2023-02-19 (×13): qty 2

## 2023-02-19 MED ORDER — PREDNISOLONE ACETATE 1 % OP SUSP
1.0000 [drp] | Freq: Four times a day (QID) | OPHTHALMIC | Status: DC
  Administered 2023-02-19 – 2023-02-25 (×21): 1 [drp] via OPHTHALMIC
  Filled 2023-02-19: qty 5

## 2023-02-19 MED ORDER — DM-GUAIFENESIN ER 30-600 MG PO TB12
1.0000 | ORAL_TABLET | Freq: Two times a day (BID) | ORAL | Status: DC
  Administered 2023-02-19 – 2023-02-25 (×13): 1 via ORAL
  Filled 2023-02-19 (×2): qty 1
  Filled 2023-02-19: qty 2
  Filled 2023-02-19 (×10): qty 1

## 2023-02-19 MED ORDER — MYCOPHENOLATE MOFETIL 250 MG PO CAPS
1500.0000 mg | ORAL_CAPSULE | Freq: Two times a day (BID) | ORAL | Status: DC
  Administered 2023-02-19 – 2023-02-25 (×13): 1500 mg via ORAL
  Filled 2023-02-19 (×13): qty 6

## 2023-02-19 MED ORDER — METHYLPREDNISOLONE SODIUM SUCC 40 MG IJ SOLR
40.0000 mg | Freq: Two times a day (BID) | INTRAMUSCULAR | Status: DC
  Administered 2023-02-19 – 2023-02-23 (×9): 40 mg via INTRAVENOUS
  Filled 2023-02-19 (×9): qty 1

## 2023-02-19 MED ORDER — SODIUM CHLORIDE 0.9 % IV BOLUS
500.0000 mL | Freq: Once | INTRAVENOUS | Status: AC
  Administered 2023-02-19: 500 mL via INTRAVENOUS

## 2023-02-19 MED ORDER — IPRATROPIUM-ALBUTEROL 0.5-2.5 (3) MG/3ML IN SOLN
3.0000 mL | Freq: Three times a day (TID) | RESPIRATORY_TRACT | Status: DC
  Administered 2023-02-19 – 2023-02-21 (×6): 3 mL via RESPIRATORY_TRACT
  Filled 2023-02-19 (×6): qty 3

## 2023-02-19 MED ORDER — BUDESONIDE 0.25 MG/2ML IN SUSP
0.2500 mg | Freq: Two times a day (BID) | RESPIRATORY_TRACT | Status: DC
  Administered 2023-02-19 – 2023-02-25 (×13): 0.25 mg via RESPIRATORY_TRACT
  Filled 2023-02-19 (×13): qty 2

## 2023-02-19 MED ORDER — SODIUM CHLORIDE 0.9 % IV SOLN
250.0000 mg | Freq: Every day | INTRAVENOUS | Status: AC
  Administered 2023-02-19 – 2023-02-22 (×4): 250 mg via INTRAVENOUS
  Filled 2023-02-19 (×4): qty 20

## 2023-02-19 NOTE — Progress Notes (Signed)
PROGRESS NOTE  Michele Parrish ZOX:[id number] DOB: [birth year] DOA: 02/18/2023 PCP: Storm Frisk, MD  HPI/Recap of past 24 hours: Michele Parrish is a 57 y.o. 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 Her healthcare aide came to assess her and called EMS who found her O2 saturation to be about 76% on her home 5 L oxygen.  They gave her 125 mg Solu-Medrol, albuterol and Atrovent nebs, and sent her to the ED.  In the ED, patient was noted to be febrile, tachycardic, tachypneic and was placed on BiPAP.  Her hemoglobin was 10 (most recent 14 about 8 months ago), she also has new thrombocytopenia.  Noted to be Hemoccult negative in the ED.  Chest x-ray showed right middle lung pneumonia. Pt was given IV azithromycin, IV Rocephin, and hospitalist was contacted for admission.     Today, patient still with notable bilateral wheezing, coughing.  Had an extensive discussion with patient's reported that her home is in foreclosure and would be homeless.  Patient has no other family members around except for a daughter which she is not in good terms with.  Patient was noted to be teary my entire visit, feels very stressed and overwhelmed.  Denies any suicidal ideation/homicidal ideation.  TOC consulted    Assessment/Plan: Principal Problem:   CAP (community acquired pneumonia) Active Problems:   COPD with chronic bronchitis   Chronic diastolic heart failure (HCC)   CKD (chronic kidney disease), stage II   Acute on chronic respiratory failure with hypoxia (HCC)   Depression   Sepsis 2/2 CAP Acute on chronic hypoxic respiratory failure- home O2 5-6 L COPD exacerbation On admission noted to be febrile, tachycardic,  tachypneic Currently afebrile, with no leukocytosis Currently on 6 L of O2 Possible infiltrate/pneumonia in the right midlung, hyperinflation, tiny right effusion Continue IV azithromycin, Rocephin Start IV Solu-Medrol, Brovana/Pulmicort/DuoNebs, scheduled Mucinex Supplemental O2, BiPAP as needed Monitor closely stepdown unit  Anemia of chronic disease Iron deficiency anemia FOBT negative No evidence of bleeding Anemia panel iron 13, sats 4, vitamin B12 651 Start IV iron x 4 days  Chronic diastolic HF Appears compensated Echo in 2021 with grade 1 diastolic dysfunction, EF 65 to 65% Monitor closely  History of pulmonary hypertension Echo done in 2021 showed severely elevated pulmonary artery systolic pressure of about 64 mmHg Likely 2/2 advanced COPD Monitor closely  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   Weight as of this encounter: 63.5 kg.     Code Status: Full  Family Communication: None at this bedside  Disposition Plan: Status is: Inpatient Remains inpatient appropriate because: level of care      Consultants: None  Procedures: None  Antimicrobials: Ceftriaxone Azithromycin  DVT prophylaxis: Lovenox   Objective: Vitals:   02/19/23 0900 02/19/23 1100 02/19/23 1117 02/19/23 1200  BP: (!) 92/45 116/76    Pulse: 99 100    Resp: (!) 26 (!) 33    Temp:    98.7 F (37.1 C)  TempSrc:    Oral  SpO2: 90% 94% 92%   Weight:  Height:        Intake/Output Summary (Last 24 hours) at 02/19/2023 1407 Last data filed at 02/19/2023 1610 Gross per 24 hour  Intake 300 ml  Output 0 ml  Net 300 ml   Filed Weights   02/18/23 1034  Weight: 63.5 kg    Exam: General: NAD, acutely ill-appearing Cardiovascular: S1, S2 present Respiratory: Bilateral expiratory  wheezes noted Abdomen: Soft, nontender, nondistended, bowel sounds present Musculoskeletal: No bilateral pedal edema noted Skin: Normal Psychiatry: Poor mood     Data Reviewed: CBC: Recent Labs  Lab 02/18/23 1049 02/18/23 1357 02/19/23 0313  WBC 6.0 6.2 6.4  NEUTROABS 3.9  --   --   HGB 9.7* 9.7* 10.5*  HCT 32.0* 32.3* 34.2*  MCV 89.1 88.5 88.1  PLT 69* 78* 92*   Basic Metabolic Panel: Recent Labs  Lab 02/18/23 1049 02/18/23 1357 02/19/23 0313  NA 135  --  137  K 2.8*  --  3.5  CL 102  --  103  CO2 21*  --  26  GLUCOSE 150*  --  115*  BUN 9  --  18  CREATININE 1.00 0.95 0.78  CALCIUM 8.1*  --  8.4*  MG 1.9  --   --    GFR: Estimated Creatinine Clearance: 70.4 mL/min (by C-G formula based on SCr of 0.78 mg/dL). Liver Function Tests: No results for input(s): "AST", "ALT", "ALKPHOS", "BILITOT", "PROT", "ALBUMIN" in the last 168 hours. No results for input(s): "LIPASE", "AMYLASE" in the last 168 hours. No results for input(s): "AMMONIA" in the last 168 hours. Coagulation Profile: No results for input(s): "INR", "PROTIME" in the last 168 hours. Cardiac Enzymes: No results for input(s): "CKTOTAL", "CKMB", "CKMBINDEX", "TROPONINI" in the last 168 hours. BNP (last 3 results) No results for input(s): "PROBNP" in the last 8760 hours. HbA1C: No results for input(s): "HGBA1C" in the last 72 hours. CBG: No results for input(s): "GLUCAP" in the last 168 hours. Lipid Profile: No results for input(s): "CHOL", "HDL", "LDLCALC", "TRIG", "CHOLHDL", "LDLDIRECT" in the last 72 hours. Thyroid Function Tests: No results for input(s): "TSH", "T4TOTAL", "FREET4", "T3FREE", "THYROIDAB" in the last 72 hours. Anemia Panel: Recent Labs    02/18/23 1356 02/18/23 1357 02/18/23 2041  VITAMINB12 568  --  651  FOLATE  --  9.2 7.9  FERRITIN 247  --  269  TIBC 342  --  350  IRON 7*  --  13*  RETICCTPCT  --  0.6 0.6   Urine analysis:    Component Value Date/Time   COLORURINE  STRAW (A) 02/25/2020 1415   APPEARANCEUR CLEAR 02/25/2020 1415   LABSPEC 1.005 02/25/2020 1415   PHURINE 9.0 (H) 02/25/2020 1415   GLUCOSEU NEGATIVE 02/25/2020 1415   HGBUR NEGATIVE 02/25/2020 1415   BILIRUBINUR NEGATIVE 02/25/2020 1415   KETONESUR NEGATIVE 02/25/2020 1415   PROTEINUR NEGATIVE 02/25/2020 1415   UROBILINOGEN 1.0 10/06/2010 0637   NITRITE NEGATIVE 02/25/2020 1415   LEUKOCYTESUR NEGATIVE 02/25/2020 1415   Sepsis Labs: @LABRCNTIP (procalcitonin:4,lacticidven:4)  ) Recent Results (from the past 240 hour(s))  SARS Coronavirus 2 by RT PCR (hospital order, performed in [facility] hospital lab) *cepheid single result test* Anterior Nasal Swab     Status: None   Collection Time: 02/18/23 10:53 AM   Specimen: Anterior Nasal Swab  Result Value Ref Range Status   SARS Coronavirus 2 by RT PCR NEGATIVE NEGATIVE Final    Comment: (NOTE) SARS-CoV-2 target nucleic acids are NOT DETECTED.  The SARS-CoV-2 RNA is generally detectable  in upper and lower respiratory specimens during the acute phase of infection. The lowest concentration of SARS-CoV-2 viral copies this assay can detect is 250 copies / mL. A negative result does not preclude SARS-CoV-2 infection and should not be used as the sole basis for treatment or other patient management decisions.  A negative result may occur with improper specimen collection / handling, submission of specimen other than nasopharyngeal swab, presence of viral mutation(s) within the areas targeted by this assay, and inadequate number of viral copies (<250 copies / mL). A negative result must be combined with clinical observations, patient history, and epidemiological information.  Fact Sheet for Patients:   RoadLapTop.co.za  Fact Sheet for Healthcare Providers: http://kim-miller.com/  This test is not yet approved or  cleared by the [place] FDA and has been authorized for detection and/or  diagnosis of SARS-CoV-2 by FDA under an Emergency Use Authorization (EUA).  This EUA will remain in effect (meaning this test can be used) for the duration of the COVID-19 declaration under Section 564(b)(1) of the Act, 21 U.S.C. section 360bbb-3(b)(1), unless the authorization is terminated or revoked sooner.  Performed at [facility], [address] [place], [place] [address]   Culture, blood (routine x 2)     Status: None (Preliminary result)   Collection Time: 02/18/23 11:43 AM   Specimen: BLOOD LEFT WRIST  Result Value Ref Range Status   Specimen Description   Final    BLOOD LEFT WRIST Performed at [facility], [address] [place], [place] [address]    Special Requests   Final    BOTTLES DRAWN AEROBIC AND ANAEROBIC Blood Culture adequate volume Performed at [facility], [address], [place], [place] [address]    Culture   Final    NO GROWTH < 24 HOURS Performed at [facility], [address], [place], [place] [address]    Report Status PENDING  Incomplete  Culture, blood (routine x 2)     Status: None (Preliminary result)   Collection Time: 02/18/23 11:43 AM   Specimen: BLOOD RIGHT ARM  Result Value Ref Range Status   Specimen Description   Final    BLOOD RIGHT ARM Performed at [facility], [address] [place], [place] [address]    Special Requests   Final    BOTTLES DRAWN AEROBIC AND ANAEROBIC Blood Culture adequate volume Performed at [facility], [address] [place], [place] [address]    Culture   Final    NO GROWTH < 24 HOURS Performed at [facility], [address], [place], [place] [address]    Report Status PENDING  Incomplete      Studies: No results found.  Scheduled Meds:  arformoterol  15 mcg Nebulization BID   budesonide (PULMICORT) nebulizer solution  0.25 mg Nebulization BID   Chlorhexidine Gluconate Cloth  6 each  Topical Daily   enoxaparin (LOVENOX) injection  40 mg Subcutaneous Daily   gabapentin  300 mg Oral Daily   ipratropium-albuterol  3 mL Nebulization TID   methylPREDNISolone (SOLU-MEDROL) injection  40 mg Intravenous Q12H   mycophenolate  1,500 mg Oral BID   nicotine  14 mg Transdermal Daily   prednisoLONE acetate  1 drop Left Eye QID   sertraline  50 mg Oral Daily   umeclidinium bromide  1 puff Inhalation Daily    Continuous Infusions:  azithromycin Stopped (02/19/23 1130)   cefTRIAXone (ROCEPHIN)  IV  Stopped (02/19/23 1005)     LOS: 1 day     Briant Cedar, MD [facility]  If 7PM-7AM, please contact night-coverage [email or web address] 02/19/2023, 2:07 PM

## 2023-02-19 NOTE — Progress Notes (Signed)
    Patient Name: Michele Parrish           DOB: [birth year]  MRN: [id number]      Admission Date: 02/18/2023  Attending Provider: Maryln Gottron, MD  Primary Diagnosis: CAP (community acquired pneumonia)   Level of care: Stepdown    CROSS COVER NOTE   Date of Service   02/19/2023   Michele Parrish, 57 y.o. female, was admitted on 02/18/2023 for CAP (community acquired pneumonia).    HPI/Events of Note   RN reports SBP 70-80s, MAP< 65.  Asymptomatic.   No report of melena, or other evidence of bleeding.   Patient has history of chronic diastolic heart failure, currently no evidence of fluid overload.  Order placed for fluid bolus    Interventions/ Plan   Bolus, 500 cc Lactic acid        Anthoney Harada, DNP, ACNPC- AG [facility] [place]

## 2023-02-20 DIAGNOSIS — J189 Pneumonia, unspecified organism: Secondary | ICD-10-CM | POA: Diagnosis not present

## 2023-02-20 DIAGNOSIS — A419 Sepsis, unspecified organism: Secondary | ICD-10-CM | POA: Diagnosis not present

## 2023-02-20 LAB — CBC WITH DIFFERENTIAL/PLATELET
Abs Immature Granulocytes: 0.03 10*3/uL (ref 0.00–0.07)
Basophils Absolute: 0 10*3/uL (ref 0.0–0.1)
Basophils Relative: 0 %
Eosinophils Absolute: 0 10*3/uL (ref 0.0–0.5)
Eosinophils Relative: 0 %
HCT: 33.1 % — ABNORMAL LOW (ref 36.0–46.0)
Hemoglobin: 10 g/dL — ABNORMAL LOW (ref 12.0–15.0)
Immature Granulocytes: 0 %
Lymphocytes Relative: 22 %
Lymphs Abs: 1.5 10*3/uL (ref 0.7–4.0)
MCH: 26.3 pg (ref 26.0–34.0)
MCHC: 30.2 g/dL (ref 30.0–36.0)
MCV: 87.1 fL (ref 80.0–100.0)
Monocytes Absolute: 0.2 10*3/uL (ref 0.1–1.0)
Monocytes Relative: 3 %
Neutro Abs: 5.1 10*3/uL (ref 1.7–7.7)
Neutrophils Relative %: 75 %
Platelets: 122 10*3/uL — ABNORMAL LOW (ref 150–400)
RBC: 3.8 MIL/uL — ABNORMAL LOW (ref 3.87–5.11)
RDW: 13.9 % (ref 11.5–15.5)
WBC: 6.9 10*3/uL (ref 4.0–10.5)
nRBC: 0 % (ref 0.0–0.2)

## 2023-02-20 LAB — CULTURE, BLOOD (ROUTINE X 2): Special Requests: ADEQUATE

## 2023-02-20 LAB — BASIC METABOLIC PANEL
Anion gap: 10 (ref 5–15)
BUN: 10 mg/dL (ref 6–20)
CO2: 23 mmol/L (ref 22–32)
Calcium: 8.1 mg/dL — ABNORMAL LOW (ref 8.9–10.3)
Chloride: 107 mmol/L (ref 98–111)
Creatinine, Ser: 0.63 mg/dL (ref 0.44–1.00)
GFR, Estimated: >60 mL/min (ref >60)
Glucose, Bld: 122 mg/dL — ABNORMAL HIGH (ref 70–99)
Potassium: 4 mmol/L (ref 3.5–5.1)
Sodium: 140 mmol/L (ref 135–145)

## 2023-02-20 LAB — D-DIMER, QUANTITATIVE: D-Dimer, Quant: 0.46 ug/mL-FEU (ref 0.00–0.50)

## 2023-02-20 LAB — HEPATIC FUNCTION PANEL
ALT: 15 U/L (ref 0–44)
AST: 28 U/L (ref 15–41)
Albumin: 3.2 g/dL — ABNORMAL LOW (ref 3.5–5.0)
Alkaline Phosphatase: 50 U/L (ref 38–126)
Bilirubin, Direct: <0.1 mg/dL (ref 0.0–0.2)
Total Bilirubin: 0.2 mg/dL — ABNORMAL LOW (ref 0.3–1.2)
Total Protein: 7.6 g/dL (ref 6.5–8.1)

## 2023-02-20 MED ORDER — FLUTICASONE PROPIONATE 50 MCG/ACT NA SUSP
2.0000 | Freq: Every day | NASAL | Status: DC
  Administered 2023-02-20 – 2023-02-25 (×6): 2 via NASAL
  Filled 2023-02-20: qty 16

## 2023-02-20 MED ORDER — SALINE SPRAY 0.65 % NA SOLN
1.0000 | NASAL | Status: DC | PRN
  Administered 2023-02-20: 1 via NASAL
  Filled 2023-02-20: qty 44

## 2023-02-20 MED ORDER — MELATONIN 5 MG PO TABS
5.0000 mg | ORAL_TABLET | Freq: Once | ORAL | Status: AC
  Administered 2023-02-20: 5 mg via ORAL
  Filled 2023-02-20: qty 1

## 2023-02-20 NOTE — Progress Notes (Signed)
PROGRESS NOTE  [name]:[id number] DOB: November 17, 1965 DOA: 02/18/2023 PCP: Storm Frisk, MD  HPI/Recap of past 24 hours: Michele Parrish is a 57 y.o. 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 Her healthcare aide came to assess her and called EMS who found her O2 saturation to be about 76% on her home 5 L oxygen.  They gave her 125 mg Solu-Medrol, albuterol and Atrovent nebs, and sent her to the ED.  In the ED, patient was noted to be febrile, tachycardic, tachypneic and was placed on BiPAP.  Her hemoglobin was 10 (most recent 14 about 8 months ago), she also has new thrombocytopenia.  Noted to be Hemoccult negative in the ED.  Chest x-ray showed right middle lung pneumonia. Pt was given IV azithromycin, IV Rocephin, and hospitalist was contacted for admission.      Today, patient noted to be requiring more oxygen, reports she desaturates even at home at nighttime and wears up to 6 L of O2, has been told she has no OSA. ?? Mouth breathing.  Otherwise reports she slept well overnight, denies any worsening cough, chest pain.  Noted to be congested    Assessment/Plan: Principal Problem:   CAP (community acquired pneumonia) Active Problems:   COPD with chronic bronchitis   Chronic diastolic heart failure (HCC)   CKD (chronic kidney disease), stage II   Acute on chronic respiratory failure with hypoxia (HCC)   Depression   Sepsis 2/2 CAP Acute on chronic hypoxic respiratory failure- home O2 5-6 L COPD exacerbation On admission noted to be febrile, tachycardic, tachypneic Currently afebrile, with no leukocytosis Currently on about 10 L of O2 Possible infiltrate/pneumonia in the right midlung,  hyperinflation, tiny right effusion Continue IV azithromycin, Rocephin Continue IV Solu-Medrol, Brovana/Pulmicort/DuoNebs, scheduled Mucinex, flonase Supplemental O2, BiPAP as needed Monitor closely stepdown unit  Anemia of chronic disease Iron deficiency anemia FOBT negative No evidence of bleeding Anemia panel iron 13, sats 4, vitamin B12 651 Start IV iron x 4 days  Chronic diastolic HF Appears compensated Echo in 2021 with grade 1 diastolic dysfunction, EF 65 to 65% Monitor closely  History of pulmonary hypertension Echo done in 2021 showed severely elevated pulmonary artery systolic pressure of about 64 mmHg Likely 2/2 advanced COPD Monitor closely  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   Weight as of this encounter: 63.5 kg.     Code Status: Full  Family Communication: None at this bedside  Disposition Plan: Status is: Inpatient Remains inpatient appropriate because: level of care      Consultants: None  Procedures: None  Antimicrobials: Ceftriaxone Azithromycin  DVT prophylaxis: Lovenox   Objective: Vitals:   02/20/23 1155 02/20/23 1200 02/20/23 1337 02/20/23 1400  BP:  100/61  (!) 136/91  Pulse:  90  100  Resp:  (!) 31  (!) 23  Temp: 97.8 F (36.6 C)     TempSrc: Axillary     SpO2:  96% 97% 95%  Weight:      Height:        Intake/Output Summary (Last 24 hours)  at 02/20/2023 1549 Last data filed at 02/20/2023 1500 Gross per 24 hour  Intake 857.81 ml  Output 600 ml  Net 257.81 ml   Filed Weights   02/18/23 1034  Weight: 63.5 kg    Exam: General: NAD, acutely ill-appearing Cardiovascular: S1, S2 present Respiratory: Bilateral expiratory wheezes noted Abdomen: Soft, nontender, nondistended, bowel sounds present Musculoskeletal: No bilateral  pedal edema noted Skin: Normal Psychiatry: Poor mood     Data Reviewed: CBC: Recent Labs  Lab 02/18/23 1049 02/18/23 1357 02/19/23 0313 02/20/23 0311  WBC 6.0 6.2 6.4 6.9  NEUTROABS 3.9  --   --  5.1  HGB 9.7* 9.7* 10.5* 10.0*  HCT 32.0* 32.3* 34.2* 33.1*  MCV 89.1 88.5 88.1 87.1  PLT 69* 78* 92* 122*   Basic Metabolic Panel: Recent Labs  Lab 02/18/23 1049 02/18/23 1357 02/19/23 0313 02/20/23 0311  NA 135  --  137 140  K 2.8*  --  3.5 4.0  CL 102  --  103 107  CO2 21*  --  26 23  GLUCOSE 150*  --  115* 122*  BUN 9  --  18 10  CREATININE 1.00 0.95 0.78 0.63  CALCIUM 8.1*  --  8.4* 8.1*  MG 1.9  --   --   --    GFR: Estimated Creatinine Clearance: 70.4 mL/min (by C-G formula based on SCr of 0.63 mg/dL). Liver Function Tests: Recent Labs  Lab 02/20/23 0817  AST 28  ALT 15  ALKPHOS 50  BILITOT 0.2*  PROT 7.6  ALBUMIN 3.2*   No results for input(s): "LIPASE", "AMYLASE" in the last 168 hours. No results for input(s): "AMMONIA" in the last 168 hours. Coagulation Profile: No results for input(s): "INR", "PROTIME" in the last 168 hours. Cardiac Enzymes: No results for input(s): "CKTOTAL", "CKMB", "CKMBINDEX", "TROPONINI" in the last 168 hours. BNP (last 3 results) No results for input(s): "PROBNP" in the last 8760 hours. HbA1C: No results for input(s): "HGBA1C" in the last 72 hours. CBG: No results for input(s): "GLUCAP" in the last 168 hours. Lipid Profile: No results for input(s): "CHOL", "HDL", "LDLCALC", "TRIG", "CHOLHDL", "LDLDIRECT" in the last 72 hours. Thyroid Function Tests: No results for input(s): "TSH", "T4TOTAL", "FREET4", "T3FREE", "THYROIDAB" in the last 72 hours. Anemia Panel: Recent Labs    02/18/23 1356 02/18/23 1357 02/18/23 2041  VITAMINB12 568  --  651  FOLATE  --  9.2 7.9  FERRITIN 247  --  269  TIBC 342  --  350  IRON 7*  --  13*  RETICCTPCT  --  0.6 0.6   Urine analysis:    Component Value Date/Time   COLORURINE STRAW  (A) 02/25/2020 1415   APPEARANCEUR CLEAR 02/25/2020 1415   LABSPEC 1.005 02/25/2020 1415   PHURINE 9.0 (H) 02/25/2020 1415   GLUCOSEU NEGATIVE 02/25/2020 1415   HGBUR NEGATIVE 02/25/2020 1415   BILIRUBINUR NEGATIVE 02/25/2020 1415   KETONESUR NEGATIVE 02/25/2020 1415   PROTEINUR NEGATIVE 02/25/2020 1415   UROBILINOGEN 1.0 10/06/2010 0637   NITRITE NEGATIVE 02/25/2020 1415   LEUKOCYTESUR NEGATIVE 02/25/2020 1415   Sepsis Labs: @LABRCNTIP (procalcitonin:4,lacticidven:4)  ) Recent Results (from the past 240 hour(s))  SARS Coronavirus 2 by RT PCR (hospital order, performed in [facility] hospital lab) *cepheid single result test* Anterior Nasal Swab     Status: None   Collection Time: 02/18/23 10:53 AM   Specimen: Anterior Nasal Swab  Result Value Ref Range Status   SARS Coronavirus 2 by RT PCR NEGATIVE NEGATIVE Final  Comment: (NOTE) SARS-CoV-2 target nucleic acids are NOT DETECTED.  The SARS-CoV-2 RNA is generally detectable in upper and lower respiratory specimens during the acute phase of infection. The lowest concentration of SARS-CoV-2 viral copies this assay can detect is 250 copies / mL. A negative result does not preclude SARS-CoV-2 infection and should not be used as the sole basis for treatment or other patient management decisions.  A negative result may occur with improper specimen collection / handling, submission of specimen other than nasopharyngeal swab, presence of viral mutation(s) within the areas targeted by this assay, and inadequate number of viral copies (<250 copies / mL). A negative result must be combined with clinical observations, patient history, and epidemiological information.  Fact Sheet for Patients:   RoadLapTop.co.za  Fact Sheet for Healthcare Providers: http://kim-miller.com/  This test is not yet approved or  cleared by the [place] FDA and has been authorized for detection and/or  diagnosis of SARS-CoV-2 by FDA under an Emergency Use Authorization (EUA).  This EUA will remain in effect (meaning this test can be used) for the duration of the COVID-19 declaration under Section 564(b)(1) of the Act, 21 U.S.C. section 360bbb-3(b)(1), unless the authorization is terminated or revoked sooner.  Performed at [facility], [address], [place], [place] [address]   Culture, blood (routine x 2)     Status: None (Preliminary result)   Collection Time: 02/18/23 11:43 AM   Specimen: BLOOD LEFT WRIST  Result Value Ref Range Status   Specimen Description   Final    BLOOD LEFT WRIST Performed at [facility], [address] [place], [place] [address]    Special Requests   Final    BOTTLES DRAWN AEROBIC AND ANAEROBIC Blood Culture adequate volume Performed at [facility], [address] [place], [place] [address]    Culture   Final    NO GROWTH 2 DAYS Performed at [facility], [address] [place], [place] [address]    Report Status PENDING  Incomplete  Culture, blood (routine x 2)     Status: None (Preliminary result)   Collection Time: 02/18/23 11:43 AM   Specimen: BLOOD RIGHT ARM  Result Value Ref Range Status   Specimen Description   Final    BLOOD RIGHT ARM Performed at [facility], [address], [place], [place] [address]    Special Requests   Final    BOTTLES DRAWN AEROBIC AND ANAEROBIC Blood Culture adequate volume Performed at [facility], [address], [place], [place] [address]    Culture   Final    NO GROWTH 2 DAYS Performed at [facility], [address] [place], [place] [address]    Report Status PENDING  Incomplete  C Difficile Quick Screen w PCR reflex     Status: None   Collection Time: 02/19/23  6:01 PM   Specimen: STOOL  Result Value Ref Range Status   C Diff antigen NEGATIVE NEGATIVE Final   C Diff toxin NEGATIVE  NEGATIVE Final   C Diff interpretation No C. difficile detected.  Final    Comment: Performed at [facility], [address], [place], [place] [address]      Studies: No results found.  Scheduled Meds:  arformoterol  15 mcg Nebulization BID   budesonide (PULMICORT) nebulizer solution  0.25 mg Nebulization BID   Chlorhexidine Gluconate Cloth  6 each Topical Daily   dextromethorphan-guaiFENesin  1  tablet Oral BID   enoxaparin (LOVENOX) injection  40 mg Subcutaneous Daily   fluticasone  2 spray Each Nare Daily   ipratropium-albuterol  3 mL Nebulization TID   methylPREDNISolone (SOLU-MEDROL) injection  40 mg Intravenous Q12H   mycophenolate  1,500 mg Oral BID   nicotine  14 mg Transdermal Daily   prednisoLONE acetate  1 drop Left Eye QID   sertraline  50 mg Oral Daily   umeclidinium bromide  1 puff Inhalation Daily    Continuous Infusions:  azithromycin Stopped (02/20/23 1122)   cefTRIAXone (ROCEPHIN)  IV Stopped (02/20/23 1610)   ferric gluconate (FERRLECIT) IVPB Stopped (02/20/23 1331)     LOS: 2 days     Briant Cedar, MD [facility]  If 7PM-7AM, please contact night-coverage [email or web address] 02/20/2023, 3:49 PM

## 2023-02-20 NOTE — Progress Notes (Signed)
Pt. currently remains on HFNC([place]) device and maintaining saturations well @ this time, RT/RN to monitor.

## 2023-02-21 ENCOUNTER — Inpatient Hospital Stay (HOSPITAL_COMMUNITY): Payer: Medicaid Other

## 2023-02-21 DIAGNOSIS — J189 Pneumonia, unspecified organism: Secondary | ICD-10-CM | POA: Diagnosis not present

## 2023-02-21 DIAGNOSIS — A419 Sepsis, unspecified organism: Secondary | ICD-10-CM | POA: Diagnosis not present

## 2023-02-21 LAB — CBC WITH DIFFERENTIAL/PLATELET
Abs Immature Granulocytes: 0.05 10*3/uL (ref 0.00–0.07)
Basophils Absolute: 0 10*3/uL (ref 0.0–0.1)
Basophils Relative: 0 %
Eosinophils Absolute: 0 10*3/uL (ref 0.0–0.5)
Eosinophils Relative: 0 %
HCT: 33.8 % — ABNORMAL LOW (ref 36.0–46.0)
Hemoglobin: 10.2 g/dL — ABNORMAL LOW (ref 12.0–15.0)
Immature Granulocytes: 1 %
Lymphocytes Relative: 21 %
Lymphs Abs: 1.4 10*3/uL (ref 0.7–4.0)
MCH: 26.6 pg (ref 26.0–34.0)
MCHC: 30.2 g/dL (ref 30.0–36.0)
MCV: 88 fL (ref 80.0–100.0)
Monocytes Absolute: 0.2 10*3/uL (ref 0.1–1.0)
Monocytes Relative: 3 %
Neutro Abs: 4.9 10*3/uL (ref 1.7–7.7)
Neutrophils Relative %: 75 %
Platelets: 173 10*3/uL (ref 150–400)
RBC: 3.84 MIL/uL — ABNORMAL LOW (ref 3.87–5.11)
RDW: 14 % (ref 11.5–15.5)
WBC: 6.5 10*3/uL (ref 4.0–10.5)
nRBC: 0 % (ref 0.0–0.2)

## 2023-02-21 LAB — RESPIRATORY PANEL BY PCR

## 2023-02-21 LAB — CULTURE, BLOOD (ROUTINE X 2): Culture: NO GROWTH

## 2023-02-21 LAB — BASIC METABOLIC PANEL WITH GFR
Anion gap: 12 (ref 5–15)
BUN: 12 mg/dL (ref 6–20)
CO2: 24 mmol/L (ref 22–32)
Calcium: 8.3 mg/dL — ABNORMAL LOW (ref 8.9–10.3)
Chloride: 103 mmol/L (ref 98–111)
Creatinine, Ser: 0.66 mg/dL (ref 0.44–1.00)
GFR, Estimated: >60 mL/min
Glucose, Bld: 120 mg/dL — ABNORMAL HIGH (ref 70–99)
Potassium: 3.7 mmol/L (ref 3.5–5.1)
Sodium: 139 mmol/L (ref 135–145)

## 2023-02-21 LAB — STREP PNEUMONIAE URINARY ANTIGEN: Strep Pneumo Urinary Antigen: NEGATIVE

## 2023-02-21 MED ORDER — MELATONIN 5 MG PO TABS
5.0000 mg | ORAL_TABLET | Freq: Every evening | ORAL | Status: AC | PRN
  Administered 2023-02-21 – 2023-02-22 (×2): 5 mg via ORAL
  Filled 2023-02-21 (×2): qty 1

## 2023-02-21 MED ORDER — REVEFENACIN 175 MCG/3ML IN SOLN
175.0000 ug | Freq: Every day | RESPIRATORY_TRACT | Status: DC
  Administered 2023-02-21 – 2023-02-25 (×5): 175 ug via RESPIRATORY_TRACT
  Filled 2023-02-21 (×5): qty 3

## 2023-02-21 MED ORDER — ALBUTEROL SULFATE (2.5 MG/3ML) 0.083% IN NEBU
2.5000 mg | INHALATION_SOLUTION | Freq: Three times a day (TID) | RESPIRATORY_TRACT | Status: DC
  Administered 2023-02-21: 2.5 mg via RESPIRATORY_TRACT
  Filled 2023-02-21 (×2): qty 3

## 2023-02-21 NOTE — Progress Notes (Signed)
       Overnight   NAME: Michele Parrish MRN: [id number] DOB : 1966/03/12    Date of Service   02/21/2023   HPI/Events of Note    Notified by RN for anxiety. Patient usually smokes cigarettes regularly and refused patch originally.  Patient agreed to Nicotine patch and Melatonin   Follow up yielded patient able to sleep and awakes readily for labs etc    Interventions/ Plan   Melatonin effective Nicotine patch effective  Continue as ordered previously       Michele Parrish BSN MSNA MSN ACNPC-AG Acute Care Nurse Practitioner [facility]

## 2023-02-21 NOTE — Progress Notes (Signed)
PROGRESS NOTE  Michele Parrish:[id number] DOB: Mar 13, 1966 DOA: 02/18/2023 PCP: Storm Frisk, MD  HPI/Recap of past 24 hours: Michele Parrish is a 57 y.o. 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 Her healthcare aide came to assess her and called EMS who found her O2 saturation to be about 76% on her home 5 L oxygen.  They gave her 125 mg Solu-Medrol, albuterol and Atrovent nebs, and sent her to the ED.  In the ED, patient was noted to be febrile, tachycardic, tachypneic and was placed on BiPAP.  Her hemoglobin was 10 (most recent 14 about 8 months ago), she also has new thrombocytopenia.  Noted to be Hemoccult negative in the ED.  Chest x-ray showed right middle lung pneumonia. Pt was given IV azithromycin, IV Rocephin, and hospitalist was contacted for admission.      Today, patient reports significant dyspnea even with little ambulation.  Cough is improving.  Denies any chest pain, abdominal pain, nausea/vomiting, fever/chills.    Assessment/Plan: Principal Problem:   CAP (community acquired pneumonia) Active Problems:   COPD with chronic bronchitis   Chronic diastolic heart failure (HCC)   CKD (chronic kidney disease), stage II   Acute on chronic respiratory failure with hypoxia (HCC)   Depression   Sepsis 2/2 CAP Acute on chronic hypoxic respiratory failure- home O2 5-6 L COPD exacerbation On admission noted to be febrile, tachycardic, tachypneic Currently afebrile, with no leukocytosis Currently on about 12 L of O2 Possible infiltrate/pneumonia in the right midlung, hyperinflation, tiny right effusion, repeat chest x-ray shows some minimal improvement Continue IV azithromycin,  Rocephin Continue IV Solu-Medrol, Brovana/Pulmicort/DuoNebs, scheduled Mucinex, flonase Supplemental O2, BiPAP as needed Monitor closely stepdown unit  Anemia of chronic disease Iron deficiency anemia FOBT negative No evidence of bleeding Anemia panel iron 13, sats 4, vitamin B12 651 Start IV iron x 4 days  Chronic diastolic HF Appears compensated Echo in 2021 with grade 1 diastolic dysfunction, EF 65 to 65% Repeat echo pending due to worsening respiratory status Monitor closely  History of pulmonary hypertension Echo done in 2021 showed severely elevated pulmonary artery systolic pressure of about 64 mmHg Likely 2/2 advanced COPD Repeat echo as mentioned above Monitor closely  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   Weight as of this encounter: 63.5 kg.     Code Status: Full  Family Communication: None at this bedside  Disposition Plan: Status is: Inpatient Remains inpatient appropriate because: level of care      Consultants: None  Procedures: None  Antimicrobials: Ceftriaxone Azithromycin  DVT prophylaxis: Lovenox   Objective: Vitals:   02/21/23 1000 02/21/23 1100 02/21/23 1147 02/21/23 1200  BP: 113/66 127/74  123/84  Pulse:  94  99  Resp: (!) 26 (!) 24  (!) 25  Temp:   97.8 F (36.6 C)   TempSrc:   Oral   SpO2:  91%  95%  Weight:      Height:        Intake/Output Summary (Last 24 hours) at 02/21/2023  1435 Last data filed at 02/21/2023 1331 Gross per 24 hour  Intake 600 ml  Output 700 ml  Net -100 ml   Filed Weights   02/18/23 1034  Weight: 63.5 kg    Exam: General: NAD, acutely ill-appearing Cardiovascular: S1, S2 present Respiratory: Diminished breath sounds bilaterally Abdomen: Soft,  nontender, nondistended, bowel sounds present Musculoskeletal: No bilateral pedal edema noted Skin: Normal Psychiatry: Poor mood     Data Reviewed: CBC: Recent Labs  Lab 02/18/23 1049 02/18/23 1357 02/19/23 0313 02/20/23 0311 02/21/23 0239  WBC 6.0 6.2 6.4 6.9 6.5  NEUTROABS 3.9  --   --  5.1 4.9  HGB 9.7* 9.7* 10.5* 10.0* 10.2*  HCT 32.0* 32.3* 34.2* 33.1* 33.8*  MCV 89.1 88.5 88.1 87.1 88.0  PLT 69* 78* 92* 122* 173   Basic Metabolic Panel: Recent Labs  Lab 02/18/23 1049 02/18/23 1357 02/19/23 0313 02/20/23 0311 02/21/23 0239  NA 135  --  137 140 139  K 2.8*  --  3.5 4.0 3.7  CL 102  --  103 107 103  CO2 21*  --  26 23 24   GLUCOSE 150*  --  115* 122* 120*  BUN 9  --  18 10 12   CREATININE 1.00 0.95 0.78 0.63 0.66  CALCIUM 8.1*  --  8.4* 8.1* 8.3*  MG 1.9  --   --   --   --    GFR: Estimated Creatinine Clearance: 70.4 mL/min (by C-G formula based on SCr of 0.66 mg/dL). Liver Function Tests: Recent Labs  Lab 02/20/23 0817  AST 28  ALT 15  ALKPHOS 50  BILITOT 0.2*  PROT 7.6  ALBUMIN 3.2*   No results for input(s): "LIPASE", "AMYLASE" in the last 168 hours. No results for input(s): "AMMONIA" in the last 168 hours. Coagulation Profile: No results for input(s): "INR", "PROTIME" in the last 168 hours. Cardiac Enzymes: No results for input(s): "CKTOTAL", "CKMB", "CKMBINDEX", "TROPONINI" in the last 168 hours. BNP (last 3 results) No results for input(s): "PROBNP" in the last 8760 hours. HbA1C: No results for input(s): "HGBA1C" in the last 72 hours. CBG: No results for input(s): "GLUCAP" in the last 168 hours. Lipid Profile: No results for input(s): "CHOL", "HDL", "LDLCALC", "TRIG", "CHOLHDL", "LDLDIRECT" in the last 72 hours. Thyroid Function Tests: No results for input(s): "TSH", "T4TOTAL", "FREET4", "T3FREE", "THYROIDAB" in the last 72 hours. Anemia Panel: Recent Labs    02/18/23 2041  VITAMINB12 651  FOLATE 7.9  FERRITIN 269  TIBC 350  IRON  13*  RETICCTPCT 0.6   Urine analysis:    Component Value Date/Time   COLORURINE STRAW (A) 02/25/2020 1415   APPEARANCEUR CLEAR 02/25/2020 1415   LABSPEC 1.005 02/25/2020 1415   PHURINE 9.0 (H) 02/25/2020 1415   GLUCOSEU NEGATIVE 02/25/2020 1415   HGBUR NEGATIVE 02/25/2020 1415   BILIRUBINUR NEGATIVE 02/25/2020 1415   KETONESUR NEGATIVE 02/25/2020 1415   PROTEINUR NEGATIVE 02/25/2020 1415   UROBILINOGEN 1.0 10/06/2010 0637   NITRITE NEGATIVE 02/25/2020 1415   LEUKOCYTESUR NEGATIVE 02/25/2020 1415   Sepsis Labs: @LABRCNTIP (procalcitonin:4,lacticidven:4)  ) Recent Results (from the past 240 hour(s))  SARS Coronavirus 2 by RT PCR (hospital order, performed in [facility] hospital lab) *cepheid single result test* Anterior Nasal Swab     Status: None   Collection Time: 02/18/23 10:53 AM   Specimen: Anterior Nasal Swab  Result Value Ref Range Status   SARS Coronavirus 2 by RT PCR NEGATIVE NEGATIVE Final    Comment: (NOTE) SARS-CoV-2 target nucleic acids  are NOT DETECTED.  The SARS-CoV-2 RNA is generally detectable in upper and lower respiratory specimens during the acute phase of infection. The lowest concentration of SARS-CoV-2 viral copies this assay can detect is 250 copies / mL. A negative result does not preclude SARS-CoV-2 infection and should not be used as the sole basis for treatment or other patient management decisions.  A negative result may occur with improper specimen collection / handling, submission of specimen other than nasopharyngeal swab, presence of viral mutation(s) within the areas targeted by this assay, and inadequate number of viral copies (<250 copies / mL). A negative result must be combined with clinical observations, patient history, and epidemiological information.  Fact Sheet for Patients:   RoadLapTop.co.za  Fact Sheet for Healthcare Providers: http://kim-miller.com/  This test is not yet approved  or  cleared by the [place] FDA and has been authorized for detection and/or diagnosis of SARS-CoV-2 by FDA under an Emergency Use Authorization (EUA).  This EUA will remain in effect (meaning this test can be used) for the duration of the COVID-19 declaration under Section 564(b)(1) of the Act, 21 U.S.C. section 360bbb-3(b)(1), unless the authorization is terminated or revoked sooner.  Performed at [facility], [address], [place], [place] [address]   Culture, blood (routine x 2)     Status: None (Preliminary result)   Collection Time: 02/18/23 11:43 AM   Specimen: BLOOD LEFT WRIST  Result Value Ref Range Status   Specimen Description   Final    BLOOD LEFT WRIST Performed at [facility], [address] [place], [place] [address]    Special Requests   Final    BOTTLES DRAWN AEROBIC AND ANAEROBIC Blood Culture adequate volume Performed at [facility], [address] [place], [place] [address]    Culture   Final    NO GROWTH 3 DAYS Performed at [facility], [address], [place], [place] [address]    Report Status PENDING  Incomplete  Culture, blood (routine x 2)     Status: None (Preliminary result)   Collection Time: 02/18/23 11:43 AM   Specimen: BLOOD RIGHT ARM  Result Value Ref Range Status   Specimen Description   Final    BLOOD RIGHT ARM Performed at [facility], [address], [place], [place] [address]    Special Requests   Final    BOTTLES DRAWN AEROBIC AND ANAEROBIC Blood Culture adequate volume Performed at [facility], [address], [place], [place] [address]    Culture  Setup Time   Final    AEROBIC BOTTLE ONLY GRAM POSITIVE RODS  C/ PHARMD A. PHAM 02/21/23 1252 A. LAFRANCE    Culture   Final    NO GROWTH 3 DAYS Performed at [facility], [address], [place], [place] [address]    Report Status PENDING  Incomplete  C Difficile  Quick Screen w PCR reflex     Status: None   Collection Time: 02/19/23  6:01 PM   Specimen: STOOL  Result Value Ref Range Status   C Diff antigen NEGATIVE NEGATIVE Final   C Diff toxin NEGATIVE NEGATIVE Final   C Diff interpretation No C. difficile detected.  Final    Comment: Performed at [facility], [address], [place], [place] [address]      Studies: DG CHEST PORT 1 VIEW  Result Date: 02/21/2023 CLINICAL DATA:  COPD with acute exacerbation. Pneumonia.  EXAM: PORTABLE CHEST 1 VIEW COMPARISON:  Radiograph 02/18/2023 FINDINGS: Improving right midlung airspace disease. Less airspace opacity at the periphery of the right lung base is similar. Slight worsening streaky opacity at the left lung base likely atelectasis. Background hyperinflation and chronic bronchial thickening. Small right pleural effusion again seen. Stable heart size and mediastinal contours. No pneumothorax. IMPRESSION: 1. Improving right midlung airspace disease. Less airspace opacity at the right lung base is similar. 2. Slight worsening streaky opacity at the left lung base likely atelectasis. 3. Stable right pleural effusion. Electronically Signed   By: Narda Rutherford M.D.   On: 02/21/2023 12:34    Scheduled Meds:  arformoterol  15 mcg Nebulization BID   budesonide (PULMICORT) nebulizer solution  0.25 mg Nebulization BID   Chlorhexidine Gluconate Cloth  6 each Topical Daily   dextromethorphan-guaiFENesin  1 tablet Oral BID   enoxaparin (LOVENOX) injection  40 mg Subcutaneous Daily   fluticasone  2 spray Each Nare Daily   ipratropium-albuterol  3 mL Nebulization TID   methylPREDNISolone (SOLU-MEDROL) injection  40 mg Intravenous Q12H   mycophenolate  1,500 mg Oral BID   nicotine  14 mg Transdermal Daily   prednisoLONE acetate  1 drop Left Eye QID   revefenacin  175 mcg Nebulization Daily   sertraline  50 mg Oral Daily    Continuous Infusions:  azithromycin Stopped (02/21/23 1131)    cefTRIAXone (ROCEPHIN)  IV Stopped (02/21/23 1023)   ferric gluconate (FERRLECIT) IVPB Stopped (02/21/23 1339)     LOS: 3 days     Briant Cedar, MD [facility]  If 7PM-7AM, please contact night-coverage [email or web address] 02/21/2023, 2:35 PM

## 2023-02-21 NOTE — Progress Notes (Addendum)
PHARMACY - PHYSICIAN COMMUNICATION CRITICAL VALUE ALERT - BLOOD CULTURE IDENTIFICATION (BCID)  Michele Parrish is an 57 y.o. female who presented to [facility] on 02/18/2023 with a chief complaint of SOB.  She was started on azithromycin and ceftriaxone on admission for suspected PNA.  One of four blood culture bottles collected on 02/18/23 resulted back with GPR (micro does not do BCID for GPR).   Name of physician (or Provider) Contacted: Dr. Sharolyn Douglas  Current antibiotics: azithromycin and ceftriaxone  Changes to prescribed antibiotics recommended:  - GPR is likely a contaminant. Recommend to not treat at this time  Lucia Gaskins 02/21/2023  12:57 PM

## 2023-02-21 NOTE — Progress Notes (Signed)
RT Note; Pt. remains on HFNC([place])@12  lpm, no BiPAP in room.

## 2023-02-22 ENCOUNTER — Inpatient Hospital Stay (HOSPITAL_COMMUNITY): Payer: Medicaid Other

## 2023-02-22 DIAGNOSIS — A419 Sepsis, unspecified organism: Secondary | ICD-10-CM | POA: Diagnosis not present

## 2023-02-22 DIAGNOSIS — I272 Pulmonary hypertension, unspecified: Secondary | ICD-10-CM

## 2023-02-22 DIAGNOSIS — R0609 Other forms of dyspnea: Secondary | ICD-10-CM | POA: Diagnosis not present

## 2023-02-22 DIAGNOSIS — J189 Pneumonia, unspecified organism: Secondary | ICD-10-CM | POA: Diagnosis not present

## 2023-02-22 LAB — BASIC METABOLIC PANEL
Anion gap: 10 (ref 5–15)
BUN: 10 mg/dL (ref 6–20)
CO2: 29 mmol/L (ref 22–32)
Calcium: 8.7 mg/dL — ABNORMAL LOW (ref 8.9–10.3)
Chloride: 100 mmol/L (ref 98–111)
Creatinine, Ser: 0.65 mg/dL (ref 0.44–1.00)
GFR, Estimated: >60 mL/min (ref >60)
Glucose, Bld: 135 mg/dL — ABNORMAL HIGH (ref 70–99)
Potassium: 3.7 mmol/L (ref 3.5–5.1)
Sodium: 139 mmol/L (ref 135–145)

## 2023-02-22 LAB — ECHOCARDIOGRAM COMPLETE
Area-P 1/2: 2.66 cm2
Calc EF: 71.6 %
Height: 62 in
MV VTI: 1.99 cm2
S' Lateral: 3 cm
Single Plane A2C EF: 71.9 %
Single Plane A4C EF: 69.7 %
Weight: 2240 oz

## 2023-02-22 LAB — CBC WITH DIFFERENTIAL/PLATELET
Abs Immature Granulocytes: 0 10*3/uL (ref 0.00–0.07)
Basophils Absolute: 0 10*3/uL (ref 0.0–0.1)
Basophils Relative: 0 %
Eosinophils Absolute: 0 10*3/uL (ref 0.0–0.5)
Eosinophils Relative: 0 %
HCT: 33.8 % — ABNORMAL LOW (ref 36.0–46.0)
Hemoglobin: 10.2 g/dL — ABNORMAL LOW (ref 12.0–15.0)
Lymphocytes Relative: 23 %
Lymphs Abs: 1.1 10*3/uL (ref 0.7–4.0)
MCH: 26.2 pg (ref 26.0–34.0)
MCHC: 30.2 g/dL (ref 30.0–36.0)
MCV: 86.9 fL (ref 80.0–100.0)
Monocytes Absolute: 0.1 10*3/uL (ref 0.1–1.0)
Monocytes Relative: 3 %
Neutro Abs: 3.6 10*3/uL (ref 1.7–7.7)
Neutrophils Relative %: 74 %
Platelets: 220 10*3/uL (ref 150–400)
RBC: 3.89 MIL/uL (ref 3.87–5.11)
RDW: 13.6 % (ref 11.5–15.5)
WBC: 4.9 10*3/uL (ref 4.0–10.5)
nRBC: 0 % (ref 0.0–0.2)

## 2023-02-22 LAB — CULTURE, BLOOD (ROUTINE X 2)
Culture: NO GROWTH
Special Requests: ADEQUATE

## 2023-02-22 NOTE — Progress Notes (Signed)
  Echocardiogram 2D Echocardiogram has been performed.  Michele Parrish 02/22/2023, 8:51 AM

## 2023-02-22 NOTE — Plan of Care (Signed)
Pt alert and oriented x4, no acute distress noted. Denies pain or discomfort. Remains dependent on 10 liters High flow with dyspnea with exertion noted. Contact and fall precautions in place. Continues IV ABT, patent and flushed with saline.   Problem: Education: Goal: Knowledge of General Education information will improve Description: Including pain rating scale, medication(s)/side effects and non-pharmacologic comfort measures Outcome: Progressing   Problem: Health Behavior/Discharge Planning: Goal: Ability to manage health-related needs will improve Outcome: Progressing   Problem: Clinical Measurements: Goal: Ability to maintain clinical measurements within normal limits will improve Outcome: Progressing Goal: Will remain free from infection Outcome: Progressing Goal: Diagnostic test results will improve Outcome: Progressing Goal: Respiratory complications will improve Outcome: Progressing Goal: Cardiovascular complication will be avoided Outcome: Progressing   Problem: Activity: Goal: Risk for activity intolerance will decrease Outcome: Progressing   Problem: Nutrition: Goal: Adequate nutrition will be maintained Outcome: Progressing   Problem: Coping: Goal: Level of anxiety will decrease Outcome: Progressing   Problem: Elimination: Goal: Will not experience complications related to bowel motility Outcome: Progressing Goal: Will not experience complications related to urinary retention Outcome: Progressing   Problem: Pain Managment: Goal: General experience of comfort will improve Outcome: Progressing   Problem: Safety: Goal: Ability to remain free from injury will improve Outcome: Progressing   Problem: Skin Integrity: Goal: Risk for impaired skin integrity will decrease Outcome: Progressing

## 2023-02-22 NOTE — Progress Notes (Signed)
PROGRESS NOTE  Michele Parrish ZOX:[id number] DOB: April 11, 1966 DOA: 02/18/2023 PCP: Storm Frisk, MD  HPI/Recap of past 24 hours: Michele Parrish is a 57 y.o. 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 Her healthcare aide came to assess her and called EMS who found her O2 saturation to be about 76% on her home 5 L oxygen.  They gave her 125 mg Solu-Medrol, albuterol and Atrovent nebs, and sent her to the ED.  In the ED, patient was noted to be febrile, tachycardic, tachypneic and was placed on BiPAP.  Her hemoglobin was 10 (most recent 14 about 8 months ago), she also has new thrombocytopenia.  Noted to be Hemoccult negative in the ED.  Chest x-ray showed right middle lung pneumonia. Pt was given IV azithromycin, IV Rocephin, and hospitalist was contacted for admission.      Today, patient still reports dyspnea still requiring about 10 to 12 L of high flow.  Denies any chest pain, abdominal pain nausea/vomiting fever/chills.    Assessment/Plan: Principal Problem:   CAP (community acquired pneumonia) Active Problems:   COPD with chronic bronchitis   Chronic diastolic heart failure (HCC)   CKD (chronic kidney disease), stage II   Acute on chronic respiratory failure with hypoxia (HCC)   Depression   Sepsis 2/2 Metapneumovirus CAP Acute on chronic hypoxic respiratory failure- home O2 5-6 L COPD exacerbation On admission noted to be febrile, tachycardic, tachypneic Currently afebrile, with no leukocytosis Currently on about 10-12 L of O2 Urine strep pneumo negative, Legionella pending Respiratory viral panel positive for metapneumovirus Possible infiltrate/pneumonia in the right midlung, hyperinflation,  tiny right effusion, repeat chest x-ray shows some minimal improvement Continue IV azithromycin, Rocephin Continue IV Solu-Medrol, Brovana/Pulmicort/DuoNebs/Yupelri, scheduled Mucinex, flonase Supplemental O2, BiPAP as needed Monitor closely stepdown unit  Anemia of chronic disease Iron deficiency anemia FOBT negative No evidence of bleeding Anemia panel iron 13, sats 4, vitamin B12 651 IV iron x 4 days, last dose 4/29  Chronic diastolic HF Appears compensated Echo showed EF of 60 to 65%, no regional wall motion abnormality, grade 1 diastolic dysfunction, small pericardial effusion present Monitor closely  History of pulmonary hypertension Echo done in 2021 showed severely elevated pulmonary artery systolic pressure of about 64 mmHg Likely 2/2 advanced COPD Echo as mentioned above Monitor closely  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   Weight as of this encounter: 63.5 kg.     Code Status: Full  Family Communication: None at this bedside  Disposition Plan: Status is: Inpatient Remains inpatient appropriate because: level of care      Consultants: None  Procedures: None  Antimicrobials: Ceftriaxone Azithromycin  DVT prophylaxis: Lovenox   Objective: Vitals:   02/22/23 0800 02/22/23 0826 02/22/23 0900 02/22/23 1000  BP: (!) 126/91  96/71 (!) 104/54  Pulse: 91  88   Resp: (!) 31  14 18   Temp: 98.2 F (36.8 C)     TempSrc: Oral     SpO2: (!) 88% 94% (!) 88% 98%  Weight:  Height:        Intake/Output Summary (Last 24 hours) at 02/22/2023 1248 Last data filed at 02/22/2023 1200 Gross per 24 hour  Intake 600 ml  Output 125 ml  Net 475 ml   Filed Weights   02/18/23 1034  Weight: 63.5 kg     Exam: General: NAD, acutely ill-appearing Cardiovascular: S1, S2 present Respiratory: Diminished breath sounds bilaterally Abdomen: Soft, nontender, nondistended, bowel sounds present Musculoskeletal: No bilateral pedal edema noted Skin: Normal Psychiatry: Poor mood     Data Reviewed: CBC: Recent Labs  Lab 02/18/23 1049 02/18/23 1357 02/19/23 0313 02/20/23 0311 02/21/23 0239 02/22/23 0307  WBC 6.0 6.2 6.4 6.9 6.5 4.9  NEUTROABS 3.9  --   --  5.1 4.9 3.6  HGB 9.7* 9.7* 10.5* 10.0* 10.2* 10.2*  HCT 32.0* 32.3* 34.2* 33.1* 33.8* 33.8*  MCV 89.1 88.5 88.1 87.1 88.0 86.9  PLT 69* 78* 92* 122* 173 220   Basic Metabolic Panel: Recent Labs  Lab 02/18/23 1049 02/18/23 1357 02/19/23 0313 02/20/23 0311 02/21/23 0239 02/22/23 0307  NA 135  --  137 140 139 139  K 2.8*  --  3.5 4.0 3.7 3.7  CL 102  --  103 107 103 100  CO2 21*  --  26 23 24 29   GLUCOSE 150*  --  115* 122* 120* 135*  BUN 9  --  18 10 12 10   CREATININE 1.00 0.95 0.78 0.63 0.66 0.65  CALCIUM 8.1*  --  8.4* 8.1* 8.3* 8.7*  MG 1.9  --   --   --   --   --    GFR: Estimated Creatinine Clearance: 70.4 mL/min (by C-G formula based on SCr of 0.65 mg/dL). Liver Function Tests: Recent Labs  Lab 02/20/23 0817  AST 28  ALT 15  ALKPHOS 50  BILITOT 0.2*  PROT 7.6  ALBUMIN 3.2*   No results for input(s): "LIPASE", "AMYLASE" in the last 168 hours. No results for input(s): "AMMONIA" in the last 168 hours. Coagulation Profile: No results for input(s): "INR", "PROTIME" in the last 168 hours. Cardiac Enzymes: No results for input(s): "CKTOTAL", "CKMB", "CKMBINDEX", "TROPONINI" in the last 168 hours. BNP (last 3 results) No results for input(s): "PROBNP" in the last 8760 hours. HbA1C: No results for input(s): "HGBA1C" in the last 72 hours. CBG: No results for input(s): "GLUCAP" in the last 168 hours. Lipid Profile: No results for input(s): "CHOL", "HDL", "LDLCALC", "TRIG", "CHOLHDL", "LDLDIRECT" in the last  72 hours. Thyroid Function Tests: No results for input(s): "TSH", "T4TOTAL", "FREET4", "T3FREE", "THYROIDAB" in the last 72 hours. Anemia Panel: No results for input(s): "VITAMINB12", "FOLATE", "FERRITIN", "TIBC", "IRON", "RETICCTPCT" in the last 72 hours.  Urine analysis:    Component Value Date/Time   COLORURINE STRAW (A) 02/25/2020 1415   APPEARANCEUR CLEAR 02/25/2020 1415   LABSPEC 1.005 02/25/2020 1415   PHURINE 9.0 (H) 02/25/2020 1415   GLUCOSEU NEGATIVE 02/25/2020 1415   HGBUR NEGATIVE 02/25/2020 1415   BILIRUBINUR NEGATIVE 02/25/2020 1415   KETONESUR NEGATIVE 02/25/2020 1415   PROTEINUR NEGATIVE 02/25/2020 1415   UROBILINOGEN 1.0 10/06/2010 0637   NITRITE NEGATIVE 02/25/2020 1415   LEUKOCYTESUR NEGATIVE 02/25/2020 1415   Sepsis Labs: @LABRCNTIP (procalcitonin:4,lacticidven:4)  ) Recent Results (from the past 240 hour(s))  SARS Coronavirus 2 by RT PCR (hospital order, performed in [facility] hospital lab) *cepheid single result test* Anterior Nasal Swab     Status: None   Collection Time: 02/18/23 10:53 AM   Specimen: Anterior Nasal Swab  Result Value Ref Range Status   SARS Coronavirus 2 by RT PCR NEGATIVE NEGATIVE Final    Comment: (NOTE) SARS-CoV-2 target nucleic acids are NOT DETECTED.  The SARS-CoV-2 RNA is generally detectable in upper and lower respiratory specimens during the acute phase of infection. The lowest concentration of SARS-CoV-2 viral copies this assay can detect is 250 copies / mL. A negative result does not preclude SARS-CoV-2 infection and should not be used as the sole basis for treatment or other patient management decisions.  A negative result may occur with improper specimen collection / handling, submission of specimen other than nasopharyngeal swab, presence of viral mutation(s) within the areas targeted by this assay, and inadequate number of viral copies (<250 copies / mL). A negative result must be combined with clinical observations,  patient history, and epidemiological information.  Fact Sheet for Patients:   RoadLapTop.co.za  Fact Sheet for Healthcare Providers: http://kim-miller.com/  This test is not yet approved or  cleared by the [place] FDA and has been authorized for detection and/or diagnosis of SARS-CoV-2 by FDA under an Emergency Use Authorization (EUA).  This EUA will remain in effect (meaning this test can be used) for the duration of the COVID-19 declaration under Section 564(b)(1) of the Act, 21 U.S.C. section 360bbb-3(b)(1), unless the authorization is terminated or revoked sooner.  Performed at [facility], [address] [place], [place] [address]   Culture, blood (routine x 2)     Status: None (Preliminary result)   Collection Time: 02/18/23 11:43 AM   Specimen: BLOOD LEFT WRIST  Result Value Ref Range Status   Specimen Description   Final    BLOOD LEFT WRIST Performed at [facility], [address], [place], [place] [address]    Special Requests   Final    BOTTLES DRAWN AEROBIC AND ANAEROBIC Blood Culture adequate volume Performed at [facility], [address] [place], [place] [address]    Culture   Final    NO GROWTH 4 DAYS Performed at [facility], [address] [place], [place] [address]    Report Status PENDING  Incomplete  Culture, blood (routine x 2)     Status: None (Preliminary result)   Collection Time: 02/18/23 11:43 AM   Specimen: BLOOD RIGHT ARM  Result Value Ref Range Status   Specimen Description   Final    BLOOD RIGHT ARM Performed at [facility], [address] [place], [place] [address]    Special Requests   Final    BOTTLES DRAWN AEROBIC AND ANAEROBIC Blood Culture adequate volume Performed at [facility], [address] [place], [place] [address]    Culture  Setup Time   Final    AEROBIC BOTTLE  ONLY GRAM POSITIVE RODS  C/ PHARMD A. PHAM 02/21/23 1252 A. LAFRANCE    Culture   Final    GRAM POSITIVE RODS IDENTIFICATION TO FOLLOW Performed at [facility], [address], [place], [place] [address]    Report Status PENDING  Incomplete  C Difficile Quick Screen w PCR reflex     Status: None   Collection Time: 02/19/23  6:01 PM   Specimen: STOOL  Result Value Ref Range Status   C Diff antigen NEGATIVE NEGATIVE Final   C Diff toxin NEGATIVE NEGATIVE Final   C Diff interpretation No C. difficile detected.  Final    Comment: Performed at [facility], [address] [place], [place] [address]  Respiratory (~20 pathogens) panel by PCR     Status: Abnormal   Collection Time: 02/21/23  1:16 PM   Specimen: Nasopharyngeal Swab; Respiratory  Result Value Ref Range Status   Adenovirus NOT DETECTED NOT DETECTED Final   Coronavirus 229E NOT DETECTED NOT DETECTED Final    Comment: (NOTE) The Coronavirus on the Respiratory Panel, DOES NOT test for the novel  Coronavirus (2019 nCoV)    Coronavirus HKU1 NOT DETECTED NOT DETECTED Final   Coronavirus NL63 NOT DETECTED NOT DETECTED Final   Coronavirus OC43 NOT DETECTED NOT DETECTED Final   Metapneumovirus DETECTED (A) NOT DETECTED Final   Rhinovirus / Enterovirus NOT DETECTED NOT DETECTED Final   Influenza A NOT DETECTED NOT DETECTED Final   Influenza B NOT DETECTED NOT DETECTED Final   Parainfluenza Virus 1 NOT DETECTED NOT DETECTED Final   Parainfluenza Virus 2 NOT DETECTED NOT DETECTED Final   Parainfluenza Virus 3 NOT DETECTED NOT DETECTED Final   Parainfluenza Virus 4 NOT DETECTED NOT DETECTED Final   Respiratory Syncytial Virus NOT DETECTED NOT DETECTED Final   Bordetella pertussis NOT DETECTED NOT DETECTED Final   Bordetella Parapertussis NOT DETECTED NOT DETECTED Final   Chlamydophila pneumoniae NOT DETECTED NOT DETECTED Final   Mycoplasma pneumoniae NOT DETECTED NOT DETECTED Final    Comment: Performed  at [facility], [address] [place], [place] [address]  Culture, blood (Routine X 2) w Reflex to ID Panel     Status: None (Preliminary result)   Collection Time: 02/21/23  1:55 PM   Specimen: BLOOD  Result Value Ref Range Status   Specimen Description   Final    BLOOD BLOOD LEFT HAND Performed at [facility], [address] [place], [place] [address]    Special Requests   Final    AEROBIC BOTTLE ONLY Blood Culture adequate volume Performed at [facility], [address] [place], [place] [address]    Culture   Final    NO GROWTH < 12 HOURS Performed at [facility], [address], [place], [place] [address]    Report Status PENDING  Incomplete  Culture, blood (Routine X 2) w Reflex to ID Panel     Status: None (Preliminary result)   Collection Time: 02/21/23  1:55 PM   Specimen: BLOOD  Result Value Ref Range Status   Specimen Description   Final    BLOOD BLOOD RIGHT HAND Performed at [facility], [address], [place], [place] [address]    Special Requests   Final    AEROBIC BOTTLE ONLY Blood Culture adequate volume Performed at [facility], [address], [place], [place] [address]    Culture   Final    NO GROWTH < 12 HOURS Performed at [facility], [address], [place], [place] [address]    Report Status PENDING  Incomplete      Studies: ECHOCARDIOGRAM COMPLETE  Result Date: 02/22/2023    ECHOCARDIOGRAM REPORT   Patient Name:   Michele Parrish Date of Exam: 02/22/2023 Medical Rec #:  [id number]        Height:       62.0 in Accession #:    [id number]       Weight:       140.0 lb Date of Birth:  Oct 09, 1966        BSA:          1.643  m Patient Age:    56 years         BP:           119/85 mmHg Patient Gender: F                HR:           67 bpm. Exam Location:  Inpatient Procedure: 2D Echo, Color Doppler and Cardiac Doppler Indications:    Pulmonary hypertension                  Dyspnea  History:        Patient has prior history of Echocardiogram examinations, most                 recent 02/26/2020. CHF, Pulmonary HTN and COPD,                 Signs/Symptoms:Dyspnea and Shortness of Breath; Risk                 Factors:Former Smoker.  Sonographer:    Milda Smart Referring Phys: [id number] [facility] J Shavonta Gossen  Sonographer Comments: Image acquisition challenging due to patient body habitus and Image acquisition challenging due to respiratory motion. IMPRESSIONS  1. Left ventricular ejection fraction, by estimation, is 60 to 65%. The left ventricle has normal function. The left ventricle has no regional wall motion abnormalities. Left ventricular diastolic parameters are consistent with Grade I diastolic dysfunction (impaired relaxation).  2. Right ventricular systolic function is normal. The right ventricular size is normal.  3. A small pericardial effusion is present.  4. The mitral valve is normal in structure. No evidence of mitral valve regurgitation. No evidence of mitral stenosis.  5. The aortic valve is tricuspid. Aortic valve regurgitation is not visualized. No aortic stenosis is present.  6. The inferior vena cava is normal in size with greater than 50% respiratory variability, suggesting right atrial pressure of 3 mmHg. FINDINGS  Left Ventricle: Left ventricular ejection fraction, by estimation, is 60 to 65%. The left ventricle has normal function. The left ventricle has no regional wall motion abnormalities. The left ventricular internal cavity size was normal in size. There is  no left ventricular hypertrophy. Left ventricular diastolic parameters are consistent with Grade I diastolic dysfunction (impaired relaxation). Right Ventricle: The right ventricular size is normal. Right ventricular systolic function is normal. Left Atrium: Left atrial size was normal in size. Right Atrium: Right atrial size was normal in size. Pericardium: A small pericardial effusion is  present. Mitral Valve: The mitral valve is normal in structure. No evidence of mitral valve regurgitation. No evidence of mitral valve stenosis. MV peak gradient, 5.1 mmHg. The mean mitral valve gradient is 2.0 mmHg. Tricuspid Valve: The tricuspid valve is normal in structure. Tricuspid valve regurgitation is not demonstrated. No evidence of tricuspid stenosis. Aortic Valve: The aortic valve is tricuspid. Aortic valve regurgitation is not visualized. No aortic stenosis is present. Pulmonic Valve: The pulmonic valve was normal in structure. Pulmonic valve regurgitation is not visualized. No evidence of pulmonic stenosis. Aorta: The aortic root is normal in size and structure. Venous: The inferior vena cava is normal in size with greater than 50% respiratory variability, suggesting right atrial pressure of 3 mmHg. IAS/Shunts: No atrial level shunt detected by color flow Doppler.  LEFT VENTRICLE PLAX 2D LVIDd:         4.10 cm     Diastology LVIDs:         3.00 cm  LV e' medial:    6.09 cm/s LV PW:         0.80 cm     LV E/e' medial:  11.3 LV IVS:        0.80 cm     LV e' lateral:   10.00 cm/s LVOT diam:     2.10 cm     LV E/e' lateral: 6.9 LV SV:         74 LV SV Index:   45 LVOT Area:     3.46 cm  LV Volumes (MOD) LV vol d, MOD A2C: 40.9 ml LV vol d, MOD A4C: 51.8 ml LV vol s, MOD A2C: 11.5 ml LV vol s, MOD A4C: 15.7 ml LV SV MOD A2C:     29.4 ml LV SV MOD A4C:     51.8 ml LV SV MOD BP:      34.4 ml RIGHT VENTRICLE             IVC RV S prime:     13.80 cm/s  IVC diam: 1.10 cm TAPSE (M-mode): 1.4 cm LEFT ATRIUM             Index        RIGHT ATRIUM          Index LA diam:        3.10 cm 1.89 cm/m   RA Area:     9.54 cm LA Vol (A2C):   31.6 ml 19.24 ml/m  RA Volume:   20.90 ml 12.72 ml/m LA Vol (A4C):   25.0 ml 15.22 ml/m LA Biplane Vol: 27.8 ml 16.92 ml/m  AORTIC VALVE LVOT Vmax:   101.00 cm/s LVOT Vmean:  74.100 cm/s LVOT VTI:    0.215 m  AORTA Ao Root diam: 2.50 cm Ao Asc diam:  3.00 cm MITRAL VALVE MV Area  (PHT): 2.66 cm    SHUNTS MV Area VTI:   1.99 cm    Systemic VTI:  0.22 m MV Peak grad:  5.1 mmHg    Systemic Diam: 2.10 cm MV Mean grad:  2.0 mmHg MV Vmax:       1.13 m/s MV Vmean:      63.1 cm/s MV Decel Time: 285 msec MV E velocity: 69.00 cm/s MV A velocity: 76.30 cm/s MV E/A ratio:  0.90 Olga Millers MD Electronically signed by Olga Millers MD Signature Date/Time: 02/22/2023/11:05:02 AM    Final     Scheduled Meds:  arformoterol  15 mcg Nebulization BID   budesonide (PULMICORT) nebulizer solution  0.25 mg Nebulization BID   Chlorhexidine Gluconate Cloth  6 each Topical Daily   dextromethorphan-guaiFENesin  1 tablet Oral BID   enoxaparin (LOVENOX) injection  40 mg Subcutaneous Daily   fluticasone  2 spray Each Nare Daily   methylPREDNISolone (SOLU-MEDROL) injection  40 mg Intravenous Q12H   mycophenolate  1,500 mg Oral BID   nicotine  14 mg Transdermal Daily   prednisoLONE acetate  1 drop Left Eye QID   revefenacin  175 mcg Nebulization Daily   sertraline  50 mg Oral Daily    Continuous Infusions:  azithromycin Stopped (02/22/23 1104)   cefTRIAXone (ROCEPHIN)  IV Stopped (02/22/23 1205)   ferric gluconate (FERRLECIT) IVPB 250 mg (02/22/23 1205)     LOS: 4 days     Briant Cedar, MD [facility]  If 7PM-7AM, please contact night-coverage [email or web address] 02/22/2023, 12:48 PM

## 2023-02-23 DIAGNOSIS — A419 Sepsis, unspecified organism: Principal | ICD-10-CM

## 2023-02-23 DIAGNOSIS — J189 Pneumonia, unspecified organism: Secondary | ICD-10-CM | POA: Diagnosis not present

## 2023-02-23 LAB — BASIC METABOLIC PANEL
Anion gap: 9 (ref 5–15)
BUN: 12 mg/dL (ref 6–20)
CO2: 28 mmol/L (ref 22–32)
Calcium: 8.4 mg/dL — ABNORMAL LOW (ref 8.9–10.3)
Chloride: 100 mmol/L (ref 98–111)
Creatinine, Ser: 0.7 mg/dL (ref 0.44–1.00)
GFR, Estimated: >60 mL/min (ref >60)
Glucose, Bld: 160 mg/dL — ABNORMAL HIGH (ref 70–99)
Potassium: 3.9 mmol/L (ref 3.5–5.1)
Sodium: 137 mmol/L (ref 135–145)

## 2023-02-23 LAB — CBC WITH DIFFERENTIAL/PLATELET
Abs Immature Granulocytes: 0.09 10*3/uL — ABNORMAL HIGH (ref 0.00–0.07)
Basophils Absolute: 0 10*3/uL (ref 0.0–0.1)
Basophils Relative: 0 %
Eosinophils Absolute: 0 10*3/uL (ref 0.0–0.5)
Eosinophils Relative: 0 %
HCT: 33.8 % — ABNORMAL LOW (ref 36.0–46.0)
Hemoglobin: 10.4 g/dL — ABNORMAL LOW (ref 12.0–15.0)
Immature Granulocytes: 1 %
Lymphocytes Relative: 26 %
Lymphs Abs: 1.8 10*3/uL (ref 0.7–4.0)
MCH: 26.4 pg (ref 26.0–34.0)
MCHC: 30.8 g/dL (ref 30.0–36.0)
MCV: 85.8 fL (ref 80.0–100.0)
Monocytes Absolute: 0.6 10*3/uL (ref 0.1–1.0)
Monocytes Relative: 9 %
Neutro Abs: 4.3 10*3/uL (ref 1.7–7.7)
Neutrophils Relative %: 64 %
Platelets: 247 10*3/uL (ref 150–400)
RBC: 3.94 MIL/uL (ref 3.87–5.11)
RDW: 13.5 % (ref 11.5–15.5)
WBC: 6.8 10*3/uL (ref 4.0–10.5)
nRBC: 0 % (ref 0.0–0.2)

## 2023-02-23 LAB — LEGIONELLA PNEUMOPHILA SEROGP 1 UR AG: L. pneumophila Serogp 1 Ur Ag: NEGATIVE

## 2023-02-23 LAB — CULTURE, BLOOD (ROUTINE X 2): Special Requests: ADEQUATE

## 2023-02-23 MED ORDER — PREDNISONE 20 MG PO TABS
40.0000 mg | ORAL_TABLET | Freq: Every day | ORAL | Status: DC
  Administered 2023-02-24 – 2023-02-25 (×2): 40 mg via ORAL
  Filled 2023-02-23: qty 2
  Filled 2023-02-23: qty 4

## 2023-02-23 MED ORDER — MELATONIN 5 MG PO TABS
5.0000 mg | ORAL_TABLET | Freq: Once | ORAL | Status: AC
  Administered 2023-02-23: 5 mg via ORAL
  Filled 2023-02-23: qty 1

## 2023-02-23 MED ORDER — POLYVINYL ALCOHOL 1.4 % OP SOLN
1.0000 [drp] | OPHTHALMIC | Status: DC | PRN
  Filled 2023-02-23: qty 15

## 2023-02-23 NOTE — Progress Notes (Signed)
PROGRESS NOTE  Michele Parrish:[id number] DOB: 1966/05/15 DOA: 02/18/2023 PCP: Storm Frisk, MD  HPI/Recap of past 24 hours: Michele Parrish is a 57 y.o. 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 Her healthcare aide came to assess her and called EMS who found her O2 saturation to be about 76% on her home 5 L oxygen.  They gave her 125 mg Solu-Medrol, albuterol and Atrovent nebs, and sent her to the ED.  In the ED, patient was noted to be febrile, tachycardic, tachypneic and was placed on BiPAP.  Her hemoglobin was 10 (most recent 14 about 8 months ago), she also has new thrombocytopenia.  Noted to be Hemoccult negative in the ED.  Chest x-ray showed right middle lung pneumonia. Pt was given IV azithromycin, IV Rocephin, and hospitalist was contacted for admission.      Today, patient slowly improving, able to ambulate short distances without feeling significantly short of breath.  Now requiring about 6 L of O2, close to her baseline.  Denies any other new complaints   Assessment/Plan: Principal Problem:   CAP (community acquired pneumonia) Active Problems:   COPD with chronic bronchitis   Chronic diastolic heart failure (HCC)   CKD (chronic kidney disease), stage II   Acute on chronic respiratory failure with hypoxia (HCC)   Depression   Sepsis 2/2 Metapneumovirus CAP Acute on chronic hypoxic respiratory failure- home O2 5-6 L COPD exacerbation On admission noted to be febrile, tachycardic, tachypneic Currently afebrile, with no leukocytosis Currently on about 6L at rest Urine strep pneumo negative, Legionella pending Respiratory viral panel positive for metapneumovirus Possible  infiltrate/pneumonia in the right midlung, hyperinflation, tiny right effusion, repeat chest x-ray shows some minimal improvement Completed IV azithromycin, Rocephin X 5 days S/p IV Solu-Medrol x 5 days, switch to PO prednisone, will need a tapered dose upon d/c Brovana/Pulmicort/DuoNebs/Yupelri, scheduled Mucinex, flonase Supplemental O2 Transfer to progressive floor  Anemia of chronic disease Iron deficiency anemia FOBT negative No evidence of bleeding Anemia panel iron 13, sats 4, vitamin B12 651 S/p IV iron x 4 days, last dose 4/29  Chronic diastolic HF Appears compensated Echo showed EF of 60 to 65%, no regional wall motion abnormality, grade 1 diastolic dysfunction, chronic small pericardial effusion present Monitor closely  History of pulmonary hypertension Echo done in 2021 showed severely elevated pulmonary artery systolic pressure of about 64 mmHg Likely 2/2 advanced COPD Echo as mentioned above Monitor closely  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   Weight as of this encounter: 63.5 kg.     Code Status: Full  Family Communication: None at bedside  Disposition Plan: Status is: Inpatient Remains inpatient appropriate because: level of care      Consultants: None  Procedures: None  Antimicrobials: None  DVT prophylaxis: Lovenox   Objective: Vitals:   02/23/23 1023 02/23/23 1030 02/23/23 1200 02/23/23 1458  BP: (!) 125/96  122/81 (!) 136/95  Pulse:  96 86 (!) 102  Resp:  (!) 21 (!) 23 16  Temp:  97.9 F (36.6 C) 97.8 F (36.6 C)  TempSrc:   Oral Oral  SpO2:  93% 93% 93%  Weight:      Height:        Intake/Output Summary (Last 24 hours) at 02/23/2023 1516 Last data filed at 02/22/2023 1549 Gross per  24 hour  Intake --  Output 1 ml  Net -1 ml   Filed Weights   02/18/23 1034  Weight: 63.5 kg    Exam: General: NAD, chronically ill-appearing Cardiovascular: S1, S2 present Respiratory: Diminished breath sounds bilaterally Abdomen: Soft, nontender, nondistended, bowel sounds present Musculoskeletal: No bilateral pedal edema noted Skin: Normal Psychiatry: Fair mood     Data Reviewed: CBC: Recent Labs  Lab 02/18/23 1049 02/18/23 1357 02/19/23 0313 02/20/23 0311 02/21/23 0239 02/22/23 0307 02/23/23 0310  WBC 6.0   < > 6.4 6.9 6.5 4.9 6.8  NEUTROABS 3.9  --   --  5.1 4.9 3.6 4.3  HGB 9.7*   < > 10.5* 10.0* 10.2* 10.2* 10.4*  HCT 32.0*   < > 34.2* 33.1* 33.8* 33.8* 33.8*  MCV 89.1   < > 88.1 87.1 88.0 86.9 85.8  PLT 69*   < > 92* 122* 173 220 247   < > = values in this interval not displayed.   Basic Metabolic Panel: Recent Labs  Lab 02/18/23 1049 02/18/23 1357 02/19/23 0313 02/20/23 0311 02/21/23 0239 02/22/23 0307 02/23/23 0310  NA 135  --  137 140 139 139 137  K 2.8*  --  3.5 4.0 3.7 3.7 3.9  CL 102  --  103 107 103 100 100  CO2 21*  --  26 23 24 29 28   GLUCOSE 150*  --  115* 122* 120* 135* 160*  BUN 9  --  18 10 12 10 12   CREATININE 1.00   < > 0.78 0.63 0.66 0.65 0.70  CALCIUM 8.1*  --  8.4* 8.1* 8.3* 8.7* 8.4*  MG 1.9  --   --   --   --   --   --    < > = values in this interval not displayed.   GFR: Estimated Creatinine Clearance: 70.4 mL/min (by C-G formula based on SCr of 0.7 mg/dL). Liver Function Tests: Recent Labs  Lab 02/20/23 0817  AST 28  ALT 15  ALKPHOS 50  BILITOT 0.2*  PROT 7.6  ALBUMIN 3.2*   No results for input(s): "LIPASE", "AMYLASE" in the last 168 hours. No results for input(s): "AMMONIA" in the last 168 hours. Coagulation Profile: No results for input(s): "INR", "PROTIME" in the last 168 hours. Cardiac Enzymes: No results for input(s): "CKTOTAL", "CKMB", "CKMBINDEX", "TROPONINI" in the last 168 hours. BNP (last 3  results) No results for input(s): "PROBNP" in the last 8760 hours. HbA1C: No results for input(s): "HGBA1C" in the last 72 hours. CBG: No results for input(s): "GLUCAP" in the last 168 hours. Lipid Profile: No results for input(s): "CHOL", "HDL", "LDLCALC", "TRIG", "CHOLHDL", "LDLDIRECT" in the last 72 hours. Thyroid Function Tests: No results for input(s): "TSH", "T4TOTAL", "FREET4", "T3FREE", "THYROIDAB" in the last 72 hours. Anemia Panel: No results for input(s): "VITAMINB12", "FOLATE", "FERRITIN", "TIBC", "IRON", "RETICCTPCT" in the last 72 hours.  Urine analysis:    Component Value Date/Time   COLORURINE STRAW (A) 02/25/2020 1415   APPEARANCEUR CLEAR 02/25/2020 1415   LABSPEC 1.005 02/25/2020 1415   PHURINE 9.0 (H) 02/25/2020 1415   GLUCOSEU NEGATIVE 02/25/2020 1415   HGBUR NEGATIVE 02/25/2020 1415   BILIRUBINUR NEGATIVE 02/25/2020  1415   KETONESUR NEGATIVE 02/25/2020 1415   PROTEINUR NEGATIVE 02/25/2020 1415   UROBILINOGEN 1.0 10/06/2010 0637   NITRITE NEGATIVE 02/25/2020 1415   LEUKOCYTESUR NEGATIVE 02/25/2020 1415   Sepsis Labs: @LABRCNTIP (procalcitonin:4,lacticidven:4)  ) Recent Results (from the past 240 hour(s))  SARS Coronavirus 2 by RT PCR (hospital order, performed in [facility] hospital lab) *cepheid single result test* Anterior Nasal Swab     Status: None   Collection Time: 02/18/23 10:53 AM   Specimen: Anterior Nasal Swab  Result Value Ref Range Status   SARS Coronavirus 2 by RT PCR NEGATIVE NEGATIVE Final    Comment: (NOTE) SARS-CoV-2 target nucleic acids are NOT DETECTED.  The SARS-CoV-2 RNA is generally detectable in upper and lower respiratory specimens during the acute phase of infection. The lowest concentration of SARS-CoV-2 viral copies this assay can detect is 250 copies / mL. A negative result does not preclude SARS-CoV-2 infection and should not be used as the sole basis for treatment or other patient management decisions.  A negative result  may occur with improper specimen collection / handling, submission of specimen other than nasopharyngeal swab, presence of viral mutation(s) within the areas targeted by this assay, and inadequate number of viral copies (<250 copies / mL). A negative result must be combined with clinical observations, patient history, and epidemiological information.  Fact Sheet for Patients:   RoadLapTop.co.za  Fact Sheet for Healthcare Providers: http://kim-miller.com/  This test is not yet approved or  cleared by the [place] FDA and has been authorized for detection and/or diagnosis of SARS-CoV-2 by FDA under an Emergency Use Authorization (EUA).  This EUA will remain in effect (meaning this test can be used) for the duration of the COVID-19 declaration under Section 564(b)(1) of the Act, 21 U.S.C. section 360bbb-3(b)(1), unless the authorization is terminated or revoked sooner.  Performed at [facility], [address] [place], [place] [address]   Culture, blood (routine x 2)     Status: None   Collection Time: 02/18/23 11:43 AM   Specimen: BLOOD LEFT WRIST  Result Value Ref Range Status   Specimen Description   Final    BLOOD LEFT WRIST Performed at [facility], [address], [place], [place] [address]    Special Requests   Final    BOTTLES DRAWN AEROBIC AND ANAEROBIC Blood Culture adequate volume Performed at [facility], [address], [place], [place] [address]    Culture   Final    NO GROWTH 5 DAYS Performed at [facility], [address], [place], [place] [address]    Report Status 02/23/2023 FINAL  Final  Culture, blood (routine x 2)     Status: None (Preliminary result)   Collection Time: 02/18/23 11:43 AM   Specimen: BLOOD RIGHT ARM  Result Value Ref Range Status   Specimen Description   Final    BLOOD RIGHT ARM Performed at [facility], [address], [place], [place] [address]    Special Requests   Final    BOTTLES DRAWN AEROBIC AND ANAEROBIC Blood Culture adequate volume Performed at [facility], [address] [place], [place] [address]    Culture  Setup Time   Final    AEROBIC BOTTLE ONLY GRAM POSITIVE RODS  C/ PHARMD A. PHAM 02/21/23 1252 A. LAFRANCE    Culture   Final    GRAM POSITIVE RODS CULTURE REINCUBATED FOR BETTER GROWTH Performed at [facility], [address], [place], [place] [address]    Report Status PENDING  Incomplete  C Difficile Quick Screen w PCR reflex     Status: None   Collection Time: 02/19/23  6:01 PM   Specimen: STOOL  Result Value Ref Range Status   C Diff antigen NEGATIVE NEGATIVE Final   C Diff toxin NEGATIVE NEGATIVE Final   C Diff interpretation No C. difficile detected.  Final    Comment: Performed at [facility], [address], [place], [place] [address]  Respiratory (~20 pathogens) panel by PCR     Status: Abnormal   Collection Time: 02/21/23  1:16 PM   Specimen: Nasopharyngeal Swab; Respiratory  Result Value Ref Range Status   Adenovirus NOT DETECTED NOT DETECTED Final   Coronavirus 229E NOT DETECTED NOT DETECTED Final    Comment: (NOTE) The Coronavirus on the Respiratory Panel, DOES NOT test for the novel  Coronavirus (2019 nCoV)    Coronavirus HKU1 NOT DETECTED NOT DETECTED Final   Coronavirus NL63 NOT DETECTED NOT DETECTED Final   Coronavirus OC43 NOT DETECTED NOT DETECTED Final   Metapneumovirus DETECTED (A) NOT DETECTED Final   Rhinovirus / Enterovirus NOT DETECTED NOT DETECTED Final   Influenza A NOT DETECTED NOT DETECTED Final   Influenza B NOT DETECTED NOT DETECTED Final   Parainfluenza Virus 1 NOT DETECTED NOT DETECTED Final   Parainfluenza Virus 2 NOT DETECTED NOT DETECTED Final   Parainfluenza Virus 3 NOT DETECTED NOT DETECTED Final   Parainfluenza Virus 4 NOT DETECTED NOT DETECTED Final    Respiratory Syncytial Virus NOT DETECTED NOT DETECTED Final   Bordetella pertussis NOT DETECTED NOT DETECTED Final   Bordetella Parapertussis NOT DETECTED NOT DETECTED Final   Chlamydophila pneumoniae NOT DETECTED NOT DETECTED Final   Mycoplasma pneumoniae NOT DETECTED NOT DETECTED Final    Comment: Performed at [facility], [address], [place], [place] [address]  Culture, blood (Routine X 2) w Reflex to ID Panel     Status: None (Preliminary result)   Collection Time: 02/21/23  1:55 PM   Specimen: BLOOD  Result Value Ref Range Status   Specimen Description   Final    BLOOD BLOOD LEFT HAND Performed at [facility], [address] [place], [place] [address]    Special Requests   Final    AEROBIC BOTTLE ONLY Blood Culture adequate volume Performed at [facility] [place], [address] [place], [place] [address]    Culture   Final    NO GROWTH 2 DAYS Performed at [facility], [address], [place], [place] [address]    Report Status PENDING  Incomplete  Culture, blood (Routine X 2) w Reflex to ID Panel     Status: None (Preliminary result)   Collection Time: 02/21/23  1:55 PM   Specimen: BLOOD  Result Value Ref Range Status   Specimen Description   Final    BLOOD BLOOD RIGHT HAND Performed at [facility], [address], [place], [place] [address]    Special Requests   Final    AEROBIC BOTTLE ONLY Blood Culture adequate volume Performed at [facility], [address] [place], [place] [address]    Culture   Final    NO GROWTH 2 DAYS Performed at [facility], [address], [place], [place] [address]    Report Status PENDING  Incomplete      Studies: No results found.  Scheduled Meds:  arformoterol  15 mcg Nebulization BID   budesonide (PULMICORT) nebulizer solution  0.25 mg Nebulization BID   Chlorhexidine Gluconate Cloth  6 each Topical Daily    dextromethorphan-guaiFENesin  1 tablet Oral BID   enoxaparin (LOVENOX) injection  40 mg Subcutaneous Daily   fluticasone  2 spray Each Nare Daily   methylPREDNISolone (SOLU-MEDROL) injection  40 mg Intravenous Q12H   mycophenolate  1,500 mg Oral BID   nicotine  14 mg Transdermal Daily   prednisoLONE acetate  1 drop Left Eye QID   revefenacin  175 mcg Nebulization Daily   sertraline  50 mg Oral Daily    Continuous Infusions:     LOS: 5 days     Briant Cedar, MD [facility]  If 7PM-7AM, please contact night-coverage [email or web address] 02/23/2023, 3:16 PM

## 2023-02-23 NOTE — Progress Notes (Signed)
Per verbal discussion with ID, patient to be on droplet precautions and verbal ok to d/c contact.

## 2023-02-24 DIAGNOSIS — J189 Pneumonia, unspecified organism: Secondary | ICD-10-CM | POA: Diagnosis not present

## 2023-02-24 LAB — CBC WITH DIFFERENTIAL/PLATELET
Abs Immature Granulocytes: 0.06 10*3/uL (ref 0.00–0.07)
Basophils Absolute: 0 10*3/uL (ref 0.0–0.1)
Basophils Relative: 0 %
Eosinophils Absolute: 0 10*3/uL (ref 0.0–0.5)
Eosinophils Relative: 0 %
HCT: 34.9 % — ABNORMAL LOW (ref 36.0–46.0)
Hemoglobin: 10.5 g/dL — ABNORMAL LOW (ref 12.0–15.0)
Immature Granulocytes: 1 %
Lymphocytes Relative: 45 %
Lymphs Abs: 4.6 10*3/uL — ABNORMAL HIGH (ref 0.7–4.0)
MCH: 26.3 pg (ref 26.0–34.0)
MCHC: 30.1 g/dL (ref 30.0–36.0)
MCV: 87.5 fL (ref 80.0–100.0)
Monocytes Absolute: 1 10*3/uL (ref 0.1–1.0)
Monocytes Relative: 11 %
Neutro Abs: 4.2 10*3/uL (ref 1.7–7.7)
Neutrophils Relative %: 43 %
Platelets: 292 10*3/uL (ref 150–400)
RBC: 3.99 MIL/uL (ref 3.87–5.11)
RDW: 13.4 % (ref 11.5–15.5)
WBC: 9.9 10*3/uL (ref 4.0–10.5)
nRBC: 0.2 % (ref 0.0–0.2)

## 2023-02-24 LAB — BASIC METABOLIC PANEL
Anion gap: 7 (ref 5–15)
BUN: 12 mg/dL (ref 6–20)
CO2: 29 mmol/L (ref 22–32)
Calcium: 8.1 mg/dL — ABNORMAL LOW (ref 8.9–10.3)
Chloride: 100 mmol/L (ref 98–111)
Creatinine, Ser: 0.67 mg/dL (ref 0.44–1.00)
GFR, Estimated: >60 mL/min (ref >60)
Glucose, Bld: 110 mg/dL — ABNORMAL HIGH (ref 70–99)
Potassium: 3.2 mmol/L — ABNORMAL LOW (ref 3.5–5.1)
Sodium: 136 mmol/L (ref 135–145)

## 2023-02-24 LAB — CULTURE, BLOOD (ROUTINE X 2): Culture: NO GROWTH

## 2023-02-24 MED ORDER — POTASSIUM CHLORIDE CRYS ER 20 MEQ PO TBCR
40.0000 meq | EXTENDED_RELEASE_TABLET | Freq: Once | ORAL | Status: AC
  Administered 2023-02-24: 40 meq via ORAL
  Filled 2023-02-24: qty 2

## 2023-02-24 MED ORDER — MELATONIN 5 MG PO TABS
5.0000 mg | ORAL_TABLET | Freq: Every evening | ORAL | Status: DC | PRN
  Administered 2023-02-24: 5 mg via ORAL
  Filled 2023-02-24: qty 1

## 2023-02-24 NOTE — Evaluation (Addendum)
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  Noted to be saturating 76% on home 5 L of oxygen,  noted to be febrile, tachycardic, tachypneic and had to put on BiPAP.  Chest x-ray showed right middle lobe pneumonia.  Clinical Impression  Pt admitted with above diagnosis. Pt ambulated 180' without an assistive device with 6L O2, SpO2 87% walking, no loss of balance, supervision to navigate obstacles 2* pt is legally blind. 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 Pt expressed significant anxiety over her home being in foreclosure due to property taxes not being paid. Noted TOC is following.  Pt currently with functional limitations due to the deficits listed below (see PT Problem List). Pt will benefit from acute skilled PT to increase their independence and safety with mobility to allow discharge.          Recommendations for follow up therapy are one component of a multi-disciplinary discharge planning process, led by the attending physician.  Recommendations may be updated based on patient status, additional functional criteria and insurance authorization.  Follow Up Recommendations       Assistance Recommended at Discharge Intermittent Supervision/Assistance  Patient can return home with the following  Assistance with cooking/housework;Assist for transportation;Direct supervision/assist for financial management    Equipment Recommendations None recommended by PT  Recommendations for Other Services       Functional Status Assessment Patient has had a recent decline in their functional status and demonstrates  the ability to make significant improvements in function in a reasonable and predictable amount of time.     Precautions / Restrictions Precautions Precautions: Other (comment);Fall Precaution Comments: monitor O2, legally blind (has some vision in L eye but none in R), 1 fall in past 6 months (tripped on O2 cord) Restrictions Weight Bearing Restrictions: No      Mobility  Bed Mobility Overal bed mobility: Independent                  Transfers Overall transfer level: Needs assistance Equipment used: None Transfers: Sit to/from Stand Sit to Stand: Supervision           General transfer comment: supervision for safety and assist to manage O2 cords    Ambulation/Gait Ambulation/Gait assistance: Supervision Gait Distance (Feet): 180 Feet Assistive device: None Gait Pattern/deviations: WFL(Within Functional Limits) Gait velocity: decr     General Gait Details: steady, no loss of balance, SpO2 87% on 6L O2 walking, 92% on 7L O2 at rest, 3/4 dyspnea with ambulation; supervision to navigate obstacles  Stairs            Wheelchair Mobility    Modified Rankin (Stroke Patients Only)       Balance Overall balance assessment: History of Falls (tripped on home O2 cord)                                           Pertinent Vitals/Pain Pain Assessment Pain Assessment: No/denies pain    Home Living Family/patient expects to be discharged to:: Private residence Living Arrangements: Alone  Available Help at Discharge: Personal care attendant Type of Home: House Home Access: Stairs to enter Entrance Stairs-Rails: Right Entrance Stairs-Number of Steps: 4   Home Layout: One level        Prior Function Prior Level of Function : Independent/Modified Independent             Mobility Comments: walks without AD, uses 6L O2 at home ADLs Comments: home health aide comes 4 hrs/day M-F, aide assists with getting groceries     Hand  Dominance        Extremity/Trunk Assessment   Upper Extremity Assessment Upper Extremity Assessment: Overall WFL for tasks assessed    Lower Extremity Assessment Lower Extremity Assessment: Overall WFL for tasks assessed    Cervical / Trunk Assessment Cervical / Trunk Assessment: Normal  Communication   Communication: No difficulties  Cognition Arousal/Alertness: Awake/alert Behavior During Therapy: Anxious Overall Cognitive Status: Within Functional Limits for tasks assessed                                 General Comments: pt tearful due to stress about her home being in foreclosure due to taxes not being pain, she's worried about becoming homeless        General Comments      Exercises  Long arc quads x 10 AROM both seated Marching x 10 AROM both seated   Assessment/Plan    PT Assessment Patient needs continued PT services  PT Problem List Decreased activity tolerance;Decreased balance;Decreased mobility       PT Treatment Interventions Gait training;Patient/family education    PT Goals (Current goals can be found in the Care Plan section)  Acute Rehab PT Goals Patient Stated Goal: resolve housing issue (pt stated stress of foreclosure is affecting her health) PT Goal Formulation: With patient Time For Goal Achievement: 03/10/23 Potential to Achieve Goals: Good    Frequency Min 1X/week     Co-evaluation               AM-PAC PT "6 Clicks" Mobility  Outcome Measure Help needed turning from your back to your side while in a flat bed without using bedrails?: None Help needed moving from lying on your back to sitting on the side of a flat bed without using bedrails?: None Help needed moving to and from a bed to a chair (including a wheelchair)?: None Help needed standing up from a chair using your arms (e.g., wheelchair or bedside chair)?: None Help needed to walk in hospital room?: A Little Help needed climbing 3-5 steps with a railing?  : None 6 Click Score: 23    End of Session Equipment Utilized During Treatment: Oxygen Activity Tolerance: Patient tolerated treatment well Patient left: in bed;with call bell/phone within reach Nurse Communication: Mobility status PT Visit Diagnosis: Difficulty in walking, not elsewhere classified (R26.2)    Time: 1610-9604 PT Time Calculation (min) (ACUTE ONLY): 41 min   Charges:   PT Evaluation $PT Eval Moderate Complexity: 1 Mod PT Treatments $Gait Training: 8-22 mins $Therapeutic Exercise: 8-22 mins        Ralene Bathe Kistler PT 02/24/2023  Acute Rehabilitation Services  Office [phone]

## 2023-02-24 NOTE — Evaluation (Addendum)
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  Noted to be saturating 76% on home 5 L of oxygen,  noted to be febrile, tachycardic, tachypneic and had to put on BiPAP.  Chest x-ray showed right middle lobe pneumonia.   Clinical Impression   Pt presents with decline in function and safety with ADLs and ADL mobility with impaired strength, balance, endurance and hx of visual impairments (R eye blindness, L eye low vision). PTA pt lived at home alone and reports that she is very concerned/worried about her home being in foreclosure due to property taxes not being paid. Pt currently requires Sup/set up with multimodal cues for direction/placement of items and in bathroom due to vision, min guard A with LB ADLs and Sup with mobility using no AD. Pt's O2 SATs remained 87-91% on 6L O2 (pt on 5-6L at baseline) during in room activity Pt would benefit from acute OT services to address impairments to maximize level of function and safety    Recommendations for follow up therapy are one component of a multi-disciplinary discharge planning process, led by the attending physician.  Recommendations may be updated based on patient status, additional functional criteria and insurance authorization.   Assistance Recommended at Discharge Set up Supervision/Assistance  Patient can return home with the following Direct supervision/assist for financial management;Assistance with cooking/housework;Direct supervision/assist for medications management    Functional Status Assessment  Patient has had a recent decline in their functional status and demonstrates the  ability to make significant improvements in function in a reasonable and predictable amount of time.  Equipment Recommendations  Tub/shower bench    Recommendations for Other Services       Precautions / Restrictions Precautions Precautions: Other (comment);Fall Precaution Comments: monitor O2, legally blind (has some vision in L eye but none in R), 1 fall in past 6 months (tripped on O2 cord) Restrictions Weight Bearing Restrictions: No      Mobility Bed Mobility Overal bed mobility: Independent                  Transfers Overall transfer level: Needs assistance Equipment used: None Transfers: Sit to/from Stand Sit to Stand: Supervision                  Balance Overall balance assessment: History of Falls                                         ADL either performed or assessed with clinical judgement   ADL Overall ADL's : Needs assistance/impaired Eating/Feeding: Set up;Independent   Grooming: Wash/dry hands;Wash/dry face;Supervision/safety   Upper Body Bathing: Supervision/ safety   Lower Body Bathing: Min guard   Upper Body Dressing : Supervision/safety   Lower Body Dressing: Min guard   Toilet Transfer: Supervision/safety;Ambulation;Regular Toilet   Toileting- Architect and Hygiene: Supervision/safety;Sit to/from stand       Functional mobility during ADLs: Supervision/safety General ADL Comments: Pt required verbal and tactile cues for set up of tasks, directions/placment of items, toilet     Vision Baseline Vision/History: 2 Legally blind (R eye) Ability to See  in Adequate Light: 1 Impaired Patient Visual Report:  (R eye blind, low vision L eye)       Perception     Praxis      Pertinent Vitals/Pain Pain Assessment Pain Assessment: No/denies pain     Hand Dominance Right   Extremity/Trunk Assessment Upper Extremity Assessment Upper Extremity Assessment: Overall WFL for tasks assessed   Lower  Extremity Assessment Lower Extremity Assessment: Defer to PT evaluation   Cervical / Trunk Assessment Cervical / Trunk Assessment: Normal   Communication Communication Communication: No difficulties   Cognition Arousal/Alertness: Awake/alert Behavior During Therapy: WFL for tasks assessed/performed Overall Cognitive Status: Within Functional Limits for tasks assessed                                       General Comments       Exercises     Shoulder Instructions      Home Living Family/patient expects to be discharged to:: Private residence Living Arrangements: Alone Available Help at Discharge: Personal care attendant Type of Home: House Home Access: Stairs to enter Secretary/administrator of Steps: 4 Entrance Stairs-Rails: Right Home Layout: One level     Bathroom Shower/Tub: Chief Strategy Officer: Standard                Prior Functioning/Environment Prior Level of Function : Independent/Modified Independent             Mobility Comments: walks without AD, uses 6L O2 at home ADLs Comments: home health aide comes 4 hrs/day M-F, aide assists home mgt, with getting groceries        OT Problem List: Decreased activity tolerance;Decreased coordination;Impaired balance (sitting and/or standing);Decreased knowledge of use of DME or AE      OT Treatment/Interventions: Self-care/ADL training;DME and/or AE instruction;Visual/perceptual remediation/compensation;Therapeutic activities;Patient/family education    OT Goals(Current goals can be found in the care plan section) Acute Rehab OT Goals Patient Stated Goal: "not lose my home" OT Goal Formulation: With patient Time For Goal Achievement: 03/10/23 Potential to Achieve Goals: Good  OT Frequency: Min 2X/week    Co-evaluation              AM-PAC OT "6 Clicks" Daily Activity     Outcome Measure Help from another person eating meals?: None Help from another person taking  care of personal grooming?: None Help from another person toileting, which includes using toliet, bedpan, or urinal?: A Little Help from another person bathing (including washing, rinsing, drying)?: A Little Help from another person to put on and taking off regular upper body clothing?: A Little Help from another person to put on and taking off regular lower body clothing?: A Little 6 Click Score: 20   End of Session Equipment Utilized During Treatment: Gait belt  Activity Tolerance: Patient tolerated treatment well Patient left: in bed;with call bell/phone within reach  OT Visit Diagnosis: Unsteadiness on feet (R26.81)                Time: 4742-5956 OT Time Calculation (min): 17 min Charges:  OT General Charges $OT Visit: 1 Visit OT Evaluation $OT Eval Moderate Complexity: 1 Mod    Galen Manila 02/24/2023, 3:16 PM

## 2023-02-24 NOTE — Plan of Care (Signed)
  Problem: Education: Goal: Knowledge of General Education information will improve Description Including pain rating scale, medication(s)/side effects and non-pharmacologic comfort measures Outcome: Progressing   Problem: Health Behavior/Discharge Planning: Goal: Ability to manage health-related needs will improve Outcome: Progressing   

## 2023-02-24 NOTE — Progress Notes (Signed)
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  Noted to be saturating 76% on home 5 L of oxygen.  On condition, noted to be febrile, tachycardic, tachypneic and had to put on BiPAP.  Chest x-ray showed right middle lobe pneumonia.  Meta-pneumovirus came out  to be positive.  Patient was admitted for the management of acute on chronic hypoxic respiratory secondary to multifocal pneumonia, COPD exacerbation.  Currently her respiratory status is stable.  Waiting for evaluation by [facility] regarding safe discharge/disposition  Assessment & Plan:  Principal Problem:   CAP (community acquired pneumonia) Active Problems:   COPD with chronic bronchitis   Chronic diastolic heart failure (HCC)   CKD (chronic kidney disease), stage II   Acute on chronic respiratory failure with hypoxia (HCC)   Depression  Sepsis secondary to multifocal pneumonia: Presented with fever, tachycardia, tachypnea.  Respiratory viral panel positive for metapneumovirus.  Sepsis physiology has resolved.  Multifocal pneumonia secondary to metapneumovirus/COPD exacerbation/acute on chronic hypoxic respiratory failure: On 5 to 6 L of oxygen chronically at baseline secondary to COPD.  Presented with severe respiratory distress, had to be put on BiPAP on presentation.  Currently weaned to baseline oxygen. Started on IV steroids, antibiotics.  Completed antibiotics course.  Steroids changed to oral.  Continue bronchodilators as needed. She follows with pulmonology for COPD.  Anemia of chronic disease/iron deficiency anemia: FOBT negative.  No evidence of bleeding.  Iron panel showed low iron.  Given IV  iron.  Hypokalemia: Supplemented with potassium  Chronic diastolic CHF: Currently euvolemic.  Echo showed EF of 60 to 65%, no wall motion abnormality, grade 1 diastolic dysfunction.  History of pulmonary hypertension: Likely secondary to advanced COPD.  Follow-up with pulmonology as an outpatient  Legal blindness/ocular syphilis: Takes CellCept, prednisone ophthalmic suspension.  Follows with ophthalmology.  Had multiple eye surgeries  Tobacco use: Counseled for cessation.  Continue nicotine patch  Disposition: Patient is currently homeless, house is on foreclosure.  TOC consulted        DVT prophylaxis:enoxaparin (LOVENOX) injection 40 mg Start: 02/18/23 1315 SCDs Start: 02/18/23 1300     Code Status: Full Code  Family Communication: None at the bedside  Patient status:Inpatient  Patient is from :Home  Anticipated discharge to:Not sure  Estimated DC date: Disposition issue secondary to homelessness   Consultants: None  Procedures:None  Antimicrobials:  Anti-infectives (From admission, onward)    Start     Dose/Rate Route Frequency Ordered Stop   02/19/23 1000  cefTRIAXone (ROCEPHIN) 1 g in sodium chloride 0.9 % 100 mL IVPB  Status:  Discontinued        1 g 200 mL/hr over 30 Minutes Intravenous Every 24 hours 02/18/23 1301 02/22/23 1355   02/19/23 1000  azithromycin (ZITHROMAX) 500 mg in sodium chloride 0.9 % 250 mL IVPB  Status:  Discontinued        500 mg 250 mL/hr over 60 Minutes Intravenous Every 24 hours 02/18/23 1301 02/22/23 1355   02/18/23 1115  cefTRIAXone (ROCEPHIN) 1 g in sodium chloride 0.9 % 100 mL IVPB        1 g 200 mL/hr over 30 Minutes Intravenous  Once 02/18/23 1114 02/18/23 1302  02/18/23 1115  azithromycin (ZITHROMAX) 500 mg in sodium chloride 0.9 % 250 mL IVPB        500 mg 250 mL/hr over 60 Minutes Intravenous  Once 02/18/23 1114 02/18/23 1429       Subjective: Patient seen and examined bedside today.  Hemodynamically stable.  On 5 L  of oxygen per minute.  Denies any worsening shortness of breath or cough.  Lying in bed.  Objective: Vitals:   02/23/23 2107 02/24/23 0318 02/24/23 0601 02/24/23 0745  BP: (!) 146/94 121/82 105/74   Pulse: (!) 105 76 76   Resp: 13 (!) 21 19   Temp: 98 F (36.7 C) 98.2 F (36.8 C) 98.5 F (36.9 C)   TempSrc:  Oral Oral   SpO2:  96% 97% 98%  Weight:      Height:        Intake/Output Summary (Last 24 hours) at 02/24/2023 0939 Last data filed at 02/24/2023 0617 Gross per 24 hour  Intake 1200 ml  Output --  Net 1200 ml   Filed Weights   02/18/23 1034  Weight: 63.5 kg    Examination:  General exam: Overall comfortable, not in distress HEENT: PERRL Respiratory system:  no wheezes or crackles  Cardiovascular system: S1 & S2 heard, RRR.  Gastrointestinal system: Abdomen is nondistended, soft and nontender. Central nervous system: Alert and oriented Extremities: No edema, no clubbing ,no cyanosis Skin: No rashes, no ulcers,no icterus     Data Reviewed: I have personally reviewed following labs and imaging studies  CBC: Recent Labs  Lab 02/20/23 0311 02/21/23 0239 02/22/23 0307 02/23/23 0310 02/24/23 0441  WBC 6.9 6.5 4.9 6.8 9.9  NEUTROABS 5.1 4.9 3.6 4.3 4.2  HGB 10.0* 10.2* 10.2* 10.4* 10.5*  HCT 33.1* 33.8* 33.8* 33.8* 34.9*  MCV 87.1 88.0 86.9 85.8 87.5  PLT 122* 173 220 247 292   Basic Metabolic Panel: Recent Labs  Lab 02/18/23 1049 02/18/23 1357 02/20/23 0311 02/21/23 0239 02/22/23 0307 02/23/23 0310 02/24/23 0441  NA 135   < > 140 139 139 137 136  K 2.8*   < > 4.0 3.7 3.7 3.9 3.2*  CL 102   < > 107 103 100 100 100  CO2 21*   < > 23 24 29 28 29   GLUCOSE 150*   < > 122* 120* 135* 160* 110*  BUN 9   < > 10 12 10 12 12   CREATININE 1.00   < > 0.63 0.66 0.65 0.70 0.67  CALCIUM 8.1*   < > 8.1* 8.3* 8.7* 8.4* 8.1*  MG 1.9  --   --   --   --   --   --    < > = values in this interval not displayed.     Recent Results (from the past 240 hour(s))  SARS  Coronavirus 2 by RT PCR (hospital order, performed in [facility] hospital lab) *cepheid single result test* Anterior Nasal Swab     Status: None   Collection Time: 02/18/23 10:53 AM   Specimen: Anterior Nasal Swab  Result Value Ref Range Status   SARS Coronavirus 2 by RT PCR NEGATIVE NEGATIVE Final    Comment: (NOTE) SARS-CoV-2 target nucleic acids are NOT DETECTED.  The SARS-CoV-2 RNA is generally detectable in upper and lower respiratory specimens during the acute phase of infection. The lowest concentration of SARS-CoV-2 viral copies this assay can detect is 250 copies / mL. A negative result does not preclude SARS-CoV-2 infection and should not  be used as the sole basis for treatment or other patient management decisions.  A negative result may occur with improper specimen collection / handling, submission of specimen other than nasopharyngeal swab, presence of viral mutation(s) within the areas targeted by this assay, and inadequate number of viral copies (<250 copies / mL). A negative result must be combined with clinical observations, patient history, and epidemiological information.  Fact Sheet for Patients:   RoadLapTop.co.za  Fact Sheet for Healthcare Providers: http://kim-miller.com/  This test is not yet approved or  cleared by the [place] FDA and has been authorized for detection and/or diagnosis of SARS-CoV-2 by FDA under an Emergency Use Authorization (EUA).  This EUA will remain in effect (meaning this test can be used) for the duration of the COVID-19 declaration under Section 564(b)(1) of the Act, 21 U.S.C. section 360bbb-3(b)(1), unless the authorization is terminated or revoked sooner.  Performed at [facility], [address] [place], [place] [address]   Culture, blood (routine x 2)     Status: None   Collection Time: 02/18/23 11:43 AM   Specimen: BLOOD LEFT WRIST  Result Value Ref  Range Status   Specimen Description   Final    BLOOD LEFT WRIST Performed at [facility], [address], [place], [place] [address]    Special Requests   Final    BOTTLES DRAWN AEROBIC AND ANAEROBIC Blood Culture adequate volume Performed at [facility], [address] [place], [place] [address]    Culture   Final    NO GROWTH 5 DAYS Performed at [facility], [address], [place], [place] [address]    Report Status 02/23/2023 FINAL  Final  Culture, blood (routine x 2)     Status: None (Preliminary result)   Collection Time: 02/18/23 11:43 AM   Specimen: BLOOD RIGHT ARM  Result Value Ref Range Status   Specimen Description   Final    BLOOD RIGHT ARM Performed at [facility], [address], [place], [place] [address]    Special Requests   Final    BOTTLES DRAWN AEROBIC AND ANAEROBIC Blood Culture adequate volume Performed at [facility], [address], [place], [place] [address]    Culture  Setup Time   Final    AEROBIC BOTTLE ONLY GRAM POSITIVE RODS  C/ PHARMD A. PHAM 02/21/23 1252 A. LAFRANCE    Culture   Final    GRAM POSITIVE RODS CULTURE REINCUBATED FOR BETTER GROWTH Performed at [facility], [address], [place], [place] [address]    Report Status PENDING  Incomplete  C Difficile Quick Screen w PCR reflex     Status: None   Collection Time: 02/19/23  6:01 PM   Specimen: STOOL  Result Value Ref Range Status   C Diff antigen NEGATIVE NEGATIVE Final   C Diff toxin NEGATIVE NEGATIVE Final   C Diff interpretation No C. difficile detected.  Final    Comment: Performed at [facility], [address], [place], [place] [address]  Respiratory (~20 pathogens) panel by PCR     Status: Abnormal   Collection Time: 02/21/23  1:16 PM   Specimen: Nasopharyngeal Swab; Respiratory  Result Value Ref Range Status   Adenovirus NOT DETECTED NOT DETECTED Final    Coronavirus 229E NOT DETECTED NOT DETECTED Final    Comment: (NOTE) The Coronavirus on the Respiratory Panel, DOES NOT test for the novel  Coronavirus (  2019 nCoV)    Coronavirus HKU1 NOT DETECTED NOT DETECTED Final   Coronavirus NL63 NOT DETECTED NOT DETECTED Final   Coronavirus OC43 NOT DETECTED NOT DETECTED Final   Metapneumovirus DETECTED (A) NOT DETECTED Final   Rhinovirus / Enterovirus NOT DETECTED NOT DETECTED Final   Influenza A NOT DETECTED NOT DETECTED Final   Influenza B NOT DETECTED NOT DETECTED Final   Parainfluenza Virus 1 NOT DETECTED NOT DETECTED Final   Parainfluenza Virus 2 NOT DETECTED NOT DETECTED Final   Parainfluenza Virus 3 NOT DETECTED NOT DETECTED Final   Parainfluenza Virus 4 NOT DETECTED NOT DETECTED Final   Respiratory Syncytial Virus NOT DETECTED NOT DETECTED Final   Bordetella pertussis NOT DETECTED NOT DETECTED Final   Bordetella Parapertussis NOT DETECTED NOT DETECTED Final   Chlamydophila pneumoniae NOT DETECTED NOT DETECTED Final   Mycoplasma pneumoniae NOT DETECTED NOT DETECTED Final    Comment: Performed at [facility], [address] [place], [place] [address]  Culture, blood (Routine X 2) w Reflex to ID Panel     Status: None (Preliminary result)   Collection Time: 02/21/23  1:55 PM   Specimen: BLOOD  Result Value Ref Range Status   Specimen Description   Final    BLOOD BLOOD LEFT HAND Performed at [facility], [address] [place], [place] [address]    Special Requests   Final    AEROBIC BOTTLE ONLY Blood Culture adequate volume Performed at [facility], [address] [place], [place] [address]    Culture   Final    NO GROWTH 3 DAYS Performed at [facility], [address] [place], [place] [address]    Report Status PENDING  Incomplete  Culture, blood (Routine X 2) w Reflex to ID Panel     Status: None (Preliminary result)   Collection Time: 02/21/23  1:55 PM   Specimen: BLOOD   Result Value Ref Range Status   Specimen Description   Final    BLOOD BLOOD RIGHT HAND Performed at [facility], [address] [place], [place] [address]    Special Requests   Final    AEROBIC BOTTLE ONLY Blood Culture adequate volume Performed at [facility], [address], [place], [place] [address]    Culture   Final    NO GROWTH 3 DAYS Performed at [facility], [address] [place], [place] [address]    Report Status PENDING  Incomplete     Radiology Studies: No results found.  Scheduled Meds:  arformoterol  15 mcg Nebulization BID   budesonide (PULMICORT) nebulizer solution  0.25 mg Nebulization BID   dextromethorphan-guaiFENesin  1 tablet Oral BID   enoxaparin (LOVENOX) injection  40 mg Subcutaneous Daily   fluticasone  2 spray Each Nare Daily   mycophenolate  1,500 mg Oral BID   nicotine  14 mg Transdermal Daily   potassium chloride  40 mEq Oral Once   prednisoLONE acetate  1 drop Left Eye QID   predniSONE  40 mg Oral Q breakfast   revefenacin  175 mcg Nebulization Daily   sertraline  50 mg Oral Daily   Continuous Infusions:   LOS: 6 days   Burnadette Pop, MD [facility] P5/10/2022, 9:39 AM

## 2023-02-24 NOTE — TOC Initial Note (Signed)
Transition of Care [facility]) - Initial/Assessment Note    Patient Details  Name: Michele Parrish MRN: [id number] Date of Birth: [birth year]  Transition of Care [facility]) CM/SW Contact:    Larrie Kass, LCSW Phone Number: 02/24/2023, 10:32 AM  Clinical Narrative:                  CSW received a consult regarding several topics the pt wishes to discuss. CSW spoke with pt, she reports being a year behind on her property taxes on her home. 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 Pt is requesting assistance with paying her property taxes on her home. CSW informed pt the hospital only provides resources and would not be able to assist with paying her property taxes. CSW inquired if the pt reached out to social services or urban minist[employer], Pt reports she did not reach out to those agencies. CSW suggested pt to reach out to those agencies to see if they can assist. CSW also suggested pt to call the [facility] tax department to see if she can get on a payment plan. Pt reported she has tried this and was told she had to be 65 or older. Pt stated she would try and speak with them again.  Pt reports barriers to seeking help. CSW inquired if pt has any friends or family that can assist with contacting these agencies Pt requested CSW to contact her friend [address] TOC to follow for d/c needs.   Expected Discharge Plan: Home/Self Care Barriers to Discharge: Continued Medical Work up   Patient Goals and CMS Choice Patient states their goals for this hospitalization and ongoing recovery are:: return home          Expected Discharge Plan and Services       Living arrangements for the past 2 months: Single Family Home                                      Prior Living Arrangements/Services Living arrangements for the past 2 months: Single Family Home Lives with:: Self Patient  language and need for interpreter reviewed:: Yes Do you feel safe going back to the place where you live?: Yes      Need for Family Participation in Patient Care: Yes (Comment) Care giver support system in place?: Yes (comment)   Criminal Activity/Legal Involvement Pertinent to Current Situation/Hospitalization: No - Comment as needed  Activities of Daily Living Home Assistive Devices/Equipment: Oxygen ADL Screening (condition at time of admission) Patient's cognitive ability adequate to safely complete daily activities?: Yes Is the patient deaf or have difficulty hearing?: No Does the patient have difficulty seeing, even when wearing glasses/contacts?: Yes Does the patient have difficulty concentrating, remembering, or making decisions?: No Patient able to express need for assistance with ADLs?: Yes Does the patient have difficulty dressing or bathing?: No Independently performs ADLs?: Yes (appropriate for developmental age) Does the patient have difficulty walking or climbing stairs?: No Weakness of Legs: None Weakness of Arms/Hands: None  Permission Sought/Granted                  Emotional Assessment Appearance:: Appears stated age Attitude/Demeanor/Rapport: Gracious Affect (typically observed): Accepting Orientation: : Oriented to Self, Oriented to Place, Oriented to  Time, Oriented to Situation   Psych  Involvement: No (comment)  Admission diagnosis:  Hypokalemia [E87.6] COPD exacerbation (HCC) [J44.1] CAP (community acquired pneumonia) [J18.9] Anemia, unspecified type [D64.9] Condyloma acuminatum of anus [A63.0] Community acquired pneumonia of right middle lobe of lung [J18.9] Patient Active Problem List   Diagnosis Date Noted   CAP (community acquired pneumonia) 02/18/2023   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) 06/15/2022   Suicidal ideation 06/15/2022   Vision changes 06/15/2022   Corneal abrasion, left 09/10/2021   Depression  09/10/2021   Prolonged QT interval 09/10/2021   Conjunctivitis 09/08/2021   Acute on chronic respiratory failure with hypoxia (HCC) 09/06/2021   Absence of bladder continence 07/22/2020   Numbness in feet 06/20/2020   Chronic respiratory failure with hypoxia (HCC) 03/26/2020   CKD (chronic kidney disease), 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   History of tobacco use 02/22/2020   Moderate to severe pulmonary hypertension (HCC) 02/22/2020   Pulmonary nodules 02/22/2020   Chronic diastolic heart failure (HCC) 02/22/2020   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   PCP:  Storm Frisk, MD Pharmacy:   [facility] [phone] G[place] Otto, [place] - [address] [place] [address] Phone: [phone] Fax: [phone]     Social Determinants of Health (SDOH) Social History: SDOH Screenings   Food Insecurity: Food Insecurity Present (02/18/2023)  Housing: High Risk (02/18/2023)  Transportation Needs: Unmet Transportation Needs (02/18/2023)  Utilities: Not At Risk (02/18/2023)  Depression (PHQ2-9): High Risk (06/15/2022)  Tobacco Use: Medium Risk (02/18/2023)   SDOH Interventions:     Readmission Risk Interventions     No data to display

## 2023-02-25 DIAGNOSIS — J189 Pneumonia, unspecified organism: Secondary | ICD-10-CM | POA: Diagnosis not present

## 2023-02-25 LAB — CULTURE, BLOOD (ROUTINE X 2): Culture: NO GROWTH

## 2023-02-25 LAB — BASIC METABOLIC PANEL
Anion gap: 8 (ref 5–15)
BUN: 16 mg/dL (ref 6–20)
CO2: 28 mmol/L (ref 22–32)
Calcium: 8.6 mg/dL — ABNORMAL LOW (ref 8.9–10.3)
Chloride: 101 mmol/L (ref 98–111)
Creatinine, Ser: 0.66 mg/dL (ref 0.44–1.00)
GFR, Estimated: >60 mL/min (ref >60)
Glucose, Bld: 97 mg/dL (ref 70–99)
Potassium: 4 mmol/L (ref 3.5–5.1)
Sodium: 137 mmol/L (ref 135–145)

## 2023-02-25 MED ORDER — TIOTROPIUM BROMIDE MONOHYDRATE 18 MCG IN CAPS
1.0000 | ORAL_CAPSULE | Freq: Every day | RESPIRATORY_TRACT | 1 refills | Status: DC
Start: 2023-02-25 — End: 2023-04-27

## 2023-02-25 MED ORDER — ALBUTEROL SULFATE HFA 108 (90 BASE) MCG/ACT IN AERS
2.0000 | INHALATION_SPRAY | RESPIRATORY_TRACT | 0 refills | Status: DC | PRN
Start: 2023-02-25 — End: 2023-04-27

## 2023-02-25 MED ORDER — PREDNISONE 20 MG PO TABS: 40.0000 mg | ORAL_TABLET | Freq: Every day | ORAL | 0 refills | Status: AC

## 2023-02-25 MED ORDER — IPRATROPIUM-ALBUTEROL 0.5-2.5 (3) MG/3ML IN SOLN
3.0000 mL | Freq: Four times a day (QID) | RESPIRATORY_TRACT | 99 refills | Status: DC | PRN
Start: 2023-02-25 — End: 2023-04-27

## 2023-02-25 MED ORDER — DM-GUAIFENESIN ER 30-600 MG PO TB12: 1.0000 | ORAL_TABLET | Freq: Two times a day (BID) | ORAL | 0 refills | Status: AC

## 2023-02-25 MED ORDER — NICOTINE 14 MG/24HR TD PT24
14.0000 mg | MEDICATED_PATCH | Freq: Every day | TRANSDERMAL | 0 refills | Status: DC
Start: 2023-02-25 — End: 2024-01-05

## 2023-02-25 NOTE — TOC Progression Note (Signed)
Transition of Care [facility]) - Progression Note    Patient Details  Name: Michele Parrish MRN: [id number] Date of Birth: November 04, 1965  Transition of Care [facility]) CM/SW Contact  Larrie Kass, LCSW Phone Number: 02/25/2023, 10:09 AM  Clinical Narrative:    CSW has reached out to 7 different home health agencies. No home health agency can accept pt's insurance. Pt will have to follow up OP. [facility]declined [facility]declined [facility]declined [facility]declined [facility]declined [facility]declined [facility]declined [facility]declined   CSW spoke with pt to informed her. Pt reports she will follow up with her PCP. She stated having concerns with Medicaid transportation and not being able to get to her PCP appointments on time.pt reports working with her PCP to arrange door to door transportation. Pt reports her friend will pick her up at time of d/c .     Expected Discharge Plan: Home/Self Care Barriers to Discharge: Continued Medical Work up  Expected Discharge Plan and Services       Living arrangements for the past 2 months: Single Family Home                                       Social Determinants of Health (SDOH) Interventions SDOH Screenings   Food Insecurity: Food Insecurity Present (02/18/2023)  Housing: Medium Risk (02/24/2023)  Transportation Needs: Unmet Transportation Needs (02/18/2023)  Utilities: Not At Risk (02/18/2023)  Depression (PHQ2-9): High Risk (06/15/2022)  Tobacco Use: Medium Risk (02/18/2023)    Readmission Risk Interventions     No data to display

## 2023-02-25 NOTE — Discharge Summary (Signed)
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  Noted to be saturating 76% on home 5 L of oxygen.  On condition, noted to be febrile, tachycardic, tachypneic and had to put on BiPAP.  Chest x-ray showed right middle lobe pneumonia.  Meta-pneumovirus came out  to be positive.  Patient was admitted for the management of acute on chronic hypoxic respiratory secondary to multifocal pneumonia, COPD exacerbation.  Currently her respiratory status is stable.  She is at baseline oxygen requirement. Medically stable for dc to home today.  Following problems were addressed during the hospitalization:   Sepsis secondary to multifocal pneumonia: Presented with fever, tachycardia, tachypnea.  Respiratory viral panel positive for metapneumovirus.  Sepsis physiology has resolved.   Multifocal pneumonia secondary to metapneumovirus/COPD exacerbation/acute on chronic hypoxic respiratory failure: On 5 to 6 L of oxygen chronically at baseline secondary to COPD.  Presented with severe respiratory distress, had to be put on BiPAP on presentation.  Currently weaned to baseline oxygen. Started on IV steroids, antibiotics.  Completed antibiotics course.  Steroids changed to oral.  Continue bronchodilators as needed. She follows with pulmonology for COPD.   Anemia of chronic disease/iron deficiency anemia: FOBT negative.  No evidence of bleeding.  Iron panel showed low iron.  Given IV iron.    Hypokalemia: Supplemented with potassium   Chronic diastolic CHF: Currently euvolemic.  Echo showed EF of 60 to 65%, no wall motion abnormality, grade 1 diastolic dysfunction.   History of pulmonary hypertension: Likely secondary to advanced COPD.  Follow-up with pulmonology as an outpatient   Legal blindness/ocular syphilis: Takes CellCept, prednisone ophthalmic suspension.  Follows with ophthalmology.  Had multiple eye surgeries   Tobacco use: Counseled for cessation.  Continue nicotine patch   Disposition: Patient is currently homeless, house is on foreclosure.  TOC consulted and resources provided   Discharge Diagnoses:  Principal Problem:   CAP (community acquired pneumonia) Active Problems:   COPD with chronic bronchitis   Chronic diastolic heart failure (HCC)   CKD (chronic kidney disease), stage II   Acute on chronic respiratory failure with hypoxia (HCC)   Depression    Discharge Instructions  Discharge Instructions     Diet - low sodium heart healthy   Complete by: As directed    Discharge instructions   Complete by: As directed    1)Please take prescribed medications as instructed 2)Follow up with your PCP and pulmonologist as an outpatient in 1 to 2 weeks 3)Stop smoking   Increase activity slowly   Complete by: As directed       Allergies as of 02/25/2023   No Known Allergies      Medication List     STOP taking these medications    furosemide 40 MG tablet Commonly known as: LASIX       TAKE these medications    acetaminophen 500 MG tablet Commonly known as: TYLENOL Take 500 mg by mouth every 6 (six) hours as needed for mild pain or moderate pain.   Advair HFA 230-21 MCG/ACT inhaler Generic drug: fluticasone-salmeterol Inhale 2  puffs into the lungs 2 (two) times daily.   albuterol 108 (90 Base) MCG/ACT inhaler Commonly known as: VENTOLIN HFA Inhale 2 puffs into the lungs every 4 (four) hours as needed for wheezing or shortness of  breath.   dextromethorphan-guaiFENesin 30-600 MG 12hr tablet Commonly known as: MUCINEX DM Take 1 tablet by mouth 2 (two) times daily for 5 days.   gabapentin 300 MG capsule Commonly known as: NEURONTIN Take 1 capsule (300 mg total) by mouth daily.   ipratropium-albuterol 0.5-2.5 (3) MG/3ML Soln Commonly known as: DUONEB Take 3 mLs by nebulization every 6 (six) hours as needed. What changed: reasons to take this   mycophenolate 500 MG tablet Commonly known as: CELLCEPT Take 1,500 mg by mouth in the morning and at bedtime.   nicotine 14 mg/24hr patch Commonly known as: NICODERM CQ - dosed in mg/24 hours Place 1 patch (14 mg total) onto the skin daily.   prednisoLONE acetate 1 % ophthalmic suspension Commonly known as: PRED FORTE Place 1 drop into the left eye 4 (four) times daily.   predniSONE 20 MG tablet Commonly known as: DELTASONE Take 2 tablets (40 mg total) by mouth daily with breakfast for 3 days. Start taking on: Feb 26, 2023   sertraline 50 MG tablet Commonly known as: ZOLOFT Take 1 tablet (50 mg total) by mouth daily. Please make PCP appointment for more refills.   tiotropium 18 MCG inhalation capsule Commonly known as: SPIRIVA Place 1 capsule (18 mcg total) into inhaler and inhale daily.        No Known Allergies  Consultations: None   Procedures/Studies: ECHOCARDIOGRAM COMPLETE  Result Date: 02/22/2023    ECHOCARDIOGRAM REPORT   Patient Name:   Michele Parrish Date of Exam: 02/22/2023 Medical Rec #:  [id number]        Height:       62.0 in Accession #:    [id number]       Weight:       140.0 lb Date of Birth:  [birth year]        BSA:          1.643 m Patient Age:    56 years         BP:           119/85 mmHg Patient Gender: F                HR:           67 bpm. Exam Location:  Inpatient Procedure: 2D Echo, Color Doppler and Cardiac Doppler Indications:    Pulmonary hypertension                 Dyspnea  History:        Patient has prior history of  Echocardiogram examinations, most                 recent 02/26/2020. CHF, Pulmonary HTN and COPD,                 Signs/Symptoms:Dyspnea and Shortness of Breath; Risk                 Factors:Former Smoker.  Sonographer:    Milda Smart Referring Phys: [id number] [facility] J EZENDUKA  Sonographer Comments: Image acquisition challenging due to patient body habitus and Image acquisition challenging due to respiratory motion. IMPRESSIONS  1. Left ventricular ejection fraction, by estimation, is 60 to 65%. The left ventricle has normal function. The left ventricle has no regional wall motion  abnormalities. Left ventricular diastolic parameters are consistent with Grade I diastolic dysfunction (impaired relaxation).  2. Right ventricular systolic function is normal. The right ventricular size is normal.  3. A small pericardial effusion is present.  4. The mitral valve is normal in structure. No evidence of mitral valve regurgitation. No evidence of mitral stenosis.  5. The aortic valve is tricuspid. Aortic valve regurgitation is not visualized. No aortic stenosis is present.  6. The inferior vena cava is normal in size with greater than 50% respiratory variability, suggesting right atrial pressure of 3 mmHg. FINDINGS  Left Ventricle: Left ventricular ejection fraction, by estimation, is 60 to 65%. The left ventricle has normal function. The left ventricle has no regional wall motion abnormalities. The left ventricular internal cavity size was normal in size. There is  no left ventricular hypertrophy. Left ventricular diastolic parameters are consistent with Grade I diastolic dysfunction (impaired relaxation). Right Ventricle: The right ventricular size is normal. Right ventricular systolic function is normal. Left Atrium: Left atrial size was normal in size. Right Atrium: Right atrial size was normal in size. Pericardium: A small pericardial effusion is present. Mitral Valve: The mitral valve is normal in structure. No  evidence of mitral valve regurgitation. No evidence of mitral valve stenosis. MV peak gradient, 5.1 mmHg. The mean mitral valve gradient is 2.0 mmHg. Tricuspid Valve: The tricuspid valve is normal in structure. Tricuspid valve regurgitation is not demonstrated. No evidence of tricuspid stenosis. Aortic Valve: The aortic valve is tricuspid. Aortic valve regurgitation is not visualized. No aortic stenosis is present. Pulmonic Valve: The pulmonic valve was normal in structure. Pulmonic valve regurgitation is not visualized. No evidence of pulmonic stenosis. Aorta: The aortic root is normal in size and structure. Venous: The inferior vena cava is normal in size with greater than 50% respiratory variability, suggesting right atrial pressure of 3 mmHg. IAS/Shunts: No atrial level shunt detected by color flow Doppler.  LEFT VENTRICLE PLAX 2D LVIDd:         4.10 cm     Diastology LVIDs:         3.00 cm     LV e' medial:    6.09 cm/s LV PW:         0.80 cm     LV E/e' medial:  11.3 LV IVS:        0.80 cm     LV e' lateral:   10.00 cm/s LVOT diam:     2.10 cm     LV E/e' lateral: 6.9 LV SV:         74 LV SV Index:   45 LVOT Area:     3.46 cm  LV Volumes (MOD) LV vol d, MOD A2C: 40.9 ml LV vol d, MOD A4C: 51.8 ml LV vol s, MOD A2C: 11.5 ml LV vol s, MOD A4C: 15.7 ml LV SV MOD A2C:     29.4 ml LV SV MOD A4C:     51.8 ml LV SV MOD BP:      34.4 ml RIGHT VENTRICLE             IVC RV S prime:     13.80 cm/s  IVC diam: 1.10 cm TAPSE (M-mode): 1.4 cm LEFT ATRIUM             Index        RIGHT ATRIUM          Index LA diam:        3.10 cm 1.89  cm/m   RA Area:     9.54 cm LA Vol (A2C):   31.6 ml 19.24 ml/m  RA Volume:   20.90 ml 12.72 ml/m LA Vol (A4C):   25.0 ml 15.22 ml/m LA Biplane Vol: 27.8 ml 16.92 ml/m  AORTIC VALVE LVOT Vmax:   101.00 cm/s LVOT Vmean:  74.100 cm/s LVOT VTI:    0.215 m  AORTA Ao Root diam: 2.50 cm Ao Asc diam:  3.00 cm MITRAL VALVE MV Area (PHT): 2.66 cm    SHUNTS MV Area VTI:   1.99 cm    Systemic VTI:   0.22 m MV Peak grad:  5.1 mmHg    Systemic Diam: 2.10 cm MV Mean grad:  2.0 mmHg MV Vmax:       1.13 m/s MV Vmean:      63.1 cm/s MV Decel Time: 285 msec MV E velocity: 69.00 cm/s MV A velocity: 76.30 cm/s MV E/A ratio:  0.90 Olga Millers MD Electronically signed by Olga Millers MD Signature Date/Time: 02/22/2023/11:05:02 AM    Final    DG CHEST PORT 1 VIEW  Result Date: 02/21/2023 CLINICAL DATA:  COPD with acute exacerbation. Pneumonia. EXAM: PORTABLE CHEST 1 VIEW COMPARISON:  Radiograph 02/18/2023 FINDINGS: Improving right midlung airspace disease. Less airspace opacity at the periphery of the right lung base is similar. Slight worsening streaky opacity at the left lung base likely atelectasis. Background hyperinflation and chronic bronchial thickening. Small right pleural effusion again seen. Stable heart size and mediastinal contours. No pneumothorax. IMPRESSION: 1. Improving right midlung airspace disease. Less airspace opacity at the right lung base is similar. 2. Slight worsening streaky opacity at the left lung base likely atelectasis. 3. Stable right pleural effusion. Electronically Signed   By: Narda Rutherford M.D.   On: 02/21/2023 12:34   DG Chest Port 1 View  Result Date: 02/18/2023 CLINICAL DATA:  Shortness of breath EXAM: PORTABLE CHEST 1 VIEW COMPARISON:  X-ray 09/06/2021 and older FINDINGS: Hyperinflation. Tiny right effusion. No left effusion. No pneumothorax. Diffuse interstitial changes. There is some bandlike changes of the left lung base. Atelectasis or scar is favored. There is new ill-defined confluence opacity in the mid right hemithorax. Acute infiltrate is possible. Please correlate for signs of pneumonia recommend follow-up. Normal cardiopericardial silhouette. Overlapping cardiac leads. IMPRESSION: Developing ill-defined parenchymal opacity in the right midlung. Possible infiltrate or pneumonia. Recommend follow-up. Tiny right effusion. Hyperinflation with chronic changes  Electronically Signed   By: Karen Kays M.D.   On: 02/18/2023 10:46      Subjective: Patient seen and examined at bedside today.  Hemodynamically stable.  Comfortable, on baseline oxygen requirement .  Denies any worsening shortness of breath or cough.  Medically stable for discharge home  Discharge Exam: Vitals:   02/25/23 0817 02/25/23 0900  BP:    Pulse:    Resp:  (!) 21  Temp:    SpO2: 97%    Vitals:   02/25/23 0535 02/25/23 0800 02/25/23 0817 02/25/23 0900  BP: 92/70     Pulse: 88     Resp:  (!) 22  (!) 21  Temp: 98.4 F (36.9 C)     TempSrc: Oral     SpO2: 97%  97%   Weight:      Height:        General: Pt is alert, awake, not in acute distress Cardiovascular: RRR, S1/S2 +, no rubs, no gallops Respiratory: decreased air sounds bilaterally, no wheezing, no rhonchi Abdominal: Soft, NT, ND, bowel sounds +  Extremities: no edema, no cyanosis    The results of significant diagnostics from this hospitalization (including imaging, microbiology, ancillary and laboratory) are listed below for reference.     Microbiology: Recent Results (from the past 240 hour(s))  SARS Coronavirus 2 by RT PCR (hospital order, performed in [facility] hospital lab) *cepheid single result test* Anterior Nasal Swab     Status: None   Collection Time: 02/18/23 10:53 AM   Specimen: Anterior Nasal Swab  Result Value Ref Range Status   SARS Coronavirus 2 by RT PCR NEGATIVE NEGATIVE Final    Comment: (NOTE) SARS-CoV-2 target nucleic acids are NOT DETECTED.  The SARS-CoV-2 RNA is generally detectable in upper and lower respiratory specimens during the acute phase of infection. The lowest concentration of SARS-CoV-2 viral copies this assay can detect is 250 copies / mL. A negative result does not preclude SARS-CoV-2 infection and should not be used as the sole basis for treatment or other patient management decisions.  A negative result may occur with improper specimen collection /  handling, submission of specimen other than nasopharyngeal swab, presence of viral mutation(s) within the areas targeted by this assay, and inadequate number of viral copies (<250 copies / mL). A negative result must be combined with clinical observations, patient history, and epidemiological information.  Fact Sheet for Patients:   RoadLapTop.co.za  Fact Sheet for Healthcare Providers: http://kim-miller.com/  This test is not yet approved or  cleared by the [place] FDA and has been authorized for detection and/or diagnosis of SARS-CoV-2 by FDA under an Emergency Use Authorization (EUA).  This EUA will remain in effect (meaning this test can be used) for the duration of the COVID-19 declaration under Section 564(b)(1) of the Act, 21 U.S.C. section 360bbb-3(b)(1), unless the authorization is terminated or revoked sooner.  Performed at [facility], [address] [place], [place] [address]   Culture, blood (routine x 2)     Status: None   Collection Time: 02/18/23 11:43 AM   Specimen: BLOOD LEFT WRIST  Result Value Ref Range Status   Specimen Description   Final    BLOOD LEFT WRIST Performed at [facility], [address], [place], [place] [address]    Special Requests   Final    BOTTLES DRAWN AEROBIC AND ANAEROBIC Blood Culture adequate volume Performed at [facility] [address], [place], [place] [address]    Culture   Final    NO GROWTH 5 DAYS Performed at [facility], [address] [place], [place] [address]    Report Status 02/23/2023 FINAL  Final  Culture, blood (routine x 2)     Status: Abnormal   Collection Time: 02/18/23 11:43 AM   Specimen: BLOOD RIGHT ARM  Result Value Ref Range Status   Specimen Description   Final    BLOOD RIGHT ARM Performed at [facility], [address] [place], [place] [address]    Special  Requests   Final    BOTTLES DRAWN AEROBIC AND ANAEROBIC Blood Culture adequate volume Performed at [facility], [address] [place], [place] [address]    Culture  Setup Time   Final    AEROBIC BOTTLE ONLY GRAM POSITIVE RODS  C/ PHARMD A. PHAM 02/21/23 1252 A. LAFRANCE    Culture (A)  Final    CORYNEBACTERIUM MINUTISSIMUM Standardized susceptibility testing for this organism is not available. Performed at [facility], [address]  [place], [place] [address]    Report Status 02/25/2023 FINAL  Final  C Difficile Quick Screen w PCR reflex     Status: None   Collection Time: 02/19/23  6:01 PM   Specimen: STOOL  Result Value Ref Range Status   C Diff antigen NEGATIVE NEGATIVE Final   C Diff toxin NEGATIVE NEGATIVE Final   C Diff interpretation No C. difficile detected.  Final    Comment: Performed at [facility], [address] [place], [place] [address]  Respiratory (~20 pathogens) panel by PCR     Status: Abnormal   Collection Time: 02/21/23  1:16 PM   Specimen: Nasopharyngeal Swab; Respiratory  Result Value Ref Range Status   Adenovirus NOT DETECTED NOT DETECTED Final   Coronavirus 229E NOT DETECTED NOT DETECTED Final    Comment: (NOTE) The Coronavirus on the Respiratory Panel, DOES NOT test for the novel  Coronavirus (2019 nCoV)    Coronavirus HKU1 NOT DETECTED NOT DETECTED Final   Coronavirus NL63 NOT DETECTED NOT DETECTED Final   Coronavirus OC43 NOT DETECTED NOT DETECTED Final   Metapneumovirus DETECTED (A) NOT DETECTED Final   Rhinovirus / Enterovirus NOT DETECTED NOT DETECTED Final   Influenza A NOT DETECTED NOT DETECTED Final   Influenza B NOT DETECTED NOT DETECTED Final   Parainfluenza Virus 1 NOT DETECTED NOT DETECTED Final   Parainfluenza Virus 2 NOT DETECTED NOT DETECTED Final   Parainfluenza Virus 3 NOT DETECTED NOT DETECTED Final   Parainfluenza Virus 4 NOT DETECTED NOT DETECTED Final   Respiratory Syncytial  Virus NOT DETECTED NOT DETECTED Final   Bordetella pertussis NOT DETECTED NOT DETECTED Final   Bordetella Parapertussis NOT DETECTED NOT DETECTED Final   Chlamydophila pneumoniae NOT DETECTED NOT DETECTED Final   Mycoplasma pneumoniae NOT DETECTED NOT DETECTED Final    Comment: Performed at [facility], [address], [place], [place] [address]  Culture, blood (Routine X 2) w Reflex to ID Panel     Status: None (Preliminary result)   Collection Time: 02/21/23  1:55 PM   Specimen: BLOOD  Result Value Ref Range Status   Specimen Description   Final    BLOOD BLOOD LEFT HAND Performed at [facility], [address] [place], [place] [address]    Special Requests   Final    AEROBIC BOTTLE ONLY Blood Culture adequate volume Performed at [facility], [address], [place], [place] [address]    Culture   Final    NO GROWTH 3 DAYS Performed at [facility], [address] [place], [place] [address]    Report Status PENDING  Incomplete  Culture, blood (Routine X 2) w Reflex to ID Panel     Status: None (Preliminary result)   Collection Time: 02/21/23  1:55 PM   Specimen: BLOOD  Result Value Ref Range Status   Specimen Description   Final    BLOOD BLOOD RIGHT HAND Performed at [facility], [address], [place], [place] [address]    Special Requests   Final    AEROBIC BOTTLE ONLY Blood Culture adequate volume Performed at [facility], [address], [place], [place] [address]    Culture   Final    NO GROWTH 3 DAYS Performed at [facility], [address] [place], [place] [address]    Report Status PENDING  Incomplete     Labs: BNP (last 3 results) Recent Labs  02/18/23 1049  BNP 19.7   Basic Metabolic Panel: Recent Labs  Lab 02/18/23 1049 02/18/23 1357 02/21/23 0239 02/22/23 0307 02/23/23 0310 02/24/23 0441 02/25/23 0450  NA 135   < > 139 139 137 136 137  K  2.8*   < > 3.7 3.7 3.9 3.2* 4.0  CL 102   < > 103 100 100 100 101  CO2 21*   < > 24 29 28 29 28   GLUCOSE 150*   < > 120* 135* 160* 110* 97  BUN 9   < > 12 10 12 12 16   CREATININE 1.00   < > 0.66 0.65 0.70 0.67 0.66  CALCIUM 8.1*   < > 8.3* 8.7* 8.4* 8.1* 8.6*  MG 1.9  --   --   --   --   --   --    < > = values in this interval not displayed.   Liver Function Tests: Recent Labs  Lab 02/20/23 0817  AST 28  ALT 15  ALKPHOS 50  BILITOT 0.2*  PROT 7.6  ALBUMIN 3.2*   No results for input(s): "LIPASE", "AMYLASE" in the last 168 hours. No results for input(s): "AMMONIA" in the last 168 hours. CBC: Recent Labs  Lab 02/20/23 0311 02/21/23 0239 02/22/23 0307 02/23/23 0310 02/24/23 0441  WBC 6.9 6.5 4.9 6.8 9.9  NEUTROABS 5.1 4.9 3.6 4.3 4.2  HGB 10.0* 10.2* 10.2* 10.4* 10.5*  HCT 33.1* 33.8* 33.8* 33.8* 34.9*  MCV 87.1 88.0 86.9 85.8 87.5  PLT 122* 173 220 247 292   Cardiac Enzymes: No results for input(s): "CKTOTAL", "CKMB", "CKMBINDEX", "TROPONINI" in the last 168 hours. BNP: Invalid input(s): "POCBNP" CBG: No results for input(s): "GLUCAP" in the last 168 hours. D-Dimer No results for input(s): "DDIMER" in the last 72 hours. Hgb A1c No results for input(s): "HGBA1C" in the last 72 hours. Lipid Profile No results for input(s): "CHOL", "HDL", "LDLCALC", "TRIG", "CHOLHDL", "LDLDIRECT" in the last 72 hours. Thyroid function studies No results for input(s): "TSH", "T4TOTAL", "T3FREE", "THYROIDAB" in the last 72 hours.  Invalid input(s): "FREET3" Anemia work up No results for input(s): "VITAMINB12", "FOLATE", "FERRITIN", "TIBC", "IRON", "RETICCTPCT" in the last 72 hours. Urinalysis    Component Value Date/Time   COLORURINE STRAW (A) 02/25/2020 1415   APPEARANCEUR CLEAR 02/25/2020 1415   LABSPEC 1.005 02/25/2020 1415   PHURINE 9.0 (H) 02/25/2020 1415   GLUCOSEU NEGATIVE 02/25/2020 1415   HGBUR NEGATIVE 02/25/2020 1415   BILIRUBINUR NEGATIVE 02/25/2020 1415    KETONESUR NEGATIVE 02/25/2020 1415   PROTEINUR NEGATIVE 02/25/2020 1415   UROBILINOGEN 1.0 10/06/2010 0637   NITRITE NEGATIVE 02/25/2020 1415   LEUKOCYTESUR NEGATIVE 02/25/2020 1415   Sepsis Labs Recent Labs  Lab 02/21/23 0239 02/22/23 0307 02/23/23 0310 02/24/23 0441  WBC 6.5 4.9 6.8 9.9   Microbiology Recent Results (from the past 240 hour(s))  SARS Coronavirus 2 by RT PCR (hospital order, performed in [facility] hospital lab) *cepheid single result test* Anterior Nasal Swab     Status: None   Collection Time: 02/18/23 10:53 AM   Specimen: Anterior Nasal Swab  Result Value Ref Range Status   SARS Coronavirus 2 by RT PCR NEGATIVE NEGATIVE Final    Comment: (NOTE) SARS-CoV-2 target nucleic acids are NOT DETECTED.  The SARS-CoV-2 RNA is generally detectable in upper and lower respiratory specimens during the acute phase of infection. The lowest concentration of SARS-CoV-2 viral copies this assay can detect is 250 copies / mL. A negative result does not  preclude SARS-CoV-2 infection and should not be used as the sole basis for treatment or other patient management decisions.  A negative result may occur with improper specimen collection / handling, submission of specimen other than nasopharyngeal swab, presence of viral mutation(s) within the areas targeted by this assay, and inadequate number of viral copies (<250 copies / mL). A negative result must be combined with clinical observations, patient history, and epidemiological information.  Fact Sheet for Patients:   RoadLapTop.co.za  Fact Sheet for Healthcare Providers: http://kim-miller.com/  This test is not yet approved or  cleared by the [place] FDA and has been authorized for detection and/or diagnosis of SARS-CoV-2 by FDA under an Emergency Use Authorization (EUA).  This EUA will remain in effect (meaning this test can be used) for the duration of the COVID-19  declaration under Section 564(b)(1) of the Act, 21 U.S.C. section 360bbb-3(b)(1), unless the authorization is terminated or revoked sooner.  Performed at [facility], [address], [place], [place] [address]   Culture, blood (routine x 2)     Status: None   Collection Time: 02/18/23 11:43 AM   Specimen: BLOOD LEFT WRIST  Result Value Ref Range Status   Specimen Description   Final    BLOOD LEFT WRIST Performed at [facility], [address], [place], [place] [address]    Special Requests   Final    BOTTLES DRAWN AEROBIC AND ANAEROBIC Blood Culture adequate volume Performed at [facility], [address], [place], [place] [address]    Culture   Final    NO GROWTH 5 DAYS Performed at [facility], [address], [place], [place] [address]    Report Status 02/23/2023 FINAL  Final  Culture, blood (routine x 2)     Status: Abnormal   Collection Time: 02/18/23 11:43 AM   Specimen: BLOOD RIGHT ARM  Result Value Ref Range Status   Specimen Description   Final    BLOOD RIGHT ARM Performed at [facility], [address], [place], [place] [address]    Special Requests   Final    BOTTLES DRAWN AEROBIC AND ANAEROBIC Blood Culture adequate volume Performed at [facility], [address] [place], [place] [address]    Culture  Setup Time   Final    AEROBIC BOTTLE ONLY GRAM POSITIVE RODS  C/ PHARMD A. PHAM 02/21/23 1252 A. LAFRANCE    Culture (A)  Final    CORYNEBACTERIUM MINUTISSIMUM Standardized susceptibility testing for this organism is not available. Performed at [facility], [address] [place], [place] [address]    Report Status 02/25/2023 FINAL  Final  C Difficile Quick Screen w PCR reflex     Status: None   Collection Time: 02/19/23  6:01 PM   Specimen: STOOL  Result Value Ref Range Status   C Diff antigen NEGATIVE NEGATIVE Final   C Diff toxin NEGATIVE  NEGATIVE Final   C Diff interpretation No C. difficile detected.  Final    Comment: Performed at [facility], [address] [place], [place] [address]  Respiratory (~20 pathogens) panel by PCR     Status: Abnormal   Collection Time: 02/21/23  1:16 PM   Specimen: Nasopharyngeal Swab; Respiratory  Result Value Ref Range Status   Adenovirus NOT DETECTED NOT DETECTED Final   Coronavirus 229E NOT DETECTED NOT DETECTED Final    Comment: (NOTE) The Coronavirus on the Respiratory Panel,  DOES NOT test for the novel  Coronavirus (2019 nCoV)    Coronavirus HKU1 NOT DETECTED NOT DETECTED Final   Coronavirus NL63 NOT DETECTED NOT DETECTED Final   Coronavirus OC43 NOT DETECTED NOT DETECTED Final   Metapneumovirus DETECTED (A) NOT DETECTED Final   Rhinovirus / Enterovirus NOT DETECTED NOT DETECTED Final   Influenza A NOT DETECTED NOT DETECTED Final   Influenza B NOT DETECTED NOT DETECTED Final   Parainfluenza Virus 1 NOT DETECTED NOT DETECTED Final   Parainfluenza Virus 2 NOT DETECTED NOT DETECTED Final   Parainfluenza Virus 3 NOT DETECTED NOT DETECTED Final   Parainfluenza Virus 4 NOT DETECTED NOT DETECTED Final   Respiratory Syncytial Virus NOT DETECTED NOT DETECTED Final   Bordetella pertussis NOT DETECTED NOT DETECTED Final   Bordetella Parapertussis NOT DETECTED NOT DETECTED Final   Chlamydophila pneumoniae NOT DETECTED NOT DETECTED Final   Mycoplasma pneumoniae NOT DETECTED NOT DETECTED Final    Comment: Performed at [facility], [address], [place], [place] [address]  Culture, blood (Routine X 2) w Reflex to ID Panel     Status: None (Preliminary result)   Collection Time: 02/21/23  1:55 PM   Specimen: BLOOD  Result Value Ref Range Status   Specimen Description   Final    BLOOD BLOOD LEFT HAND Performed at [facility], [address], [place], [place] [address]    Special Requests   Final    AEROBIC BOTTLE ONLY Blood Culture adequate  volume Performed at [facility], [address], [place], [place] [address]    Culture   Final    NO GROWTH 3 DAYS Performed at [facility], [address] [place], [place] [address]    Report Status PENDING  Incomplete  Culture, blood (Routine X 2) w Reflex to ID Panel     Status: None (Preliminary result)   Collection Time: 02/21/23  1:55 PM   Specimen: BLOOD  Result Value Ref Range Status   Specimen Description   Final    BLOOD BLOOD RIGHT HAND Performed at [facility], [address], [place], [place] [address]    Special Requests   Final    AEROBIC BOTTLE ONLY Blood Culture adequate volume Performed at [facility], [address], [place], [place] [address]    Culture   Final    NO GROWTH 3 DAYS Performed at [facility], [address] [place], [place] [address]    Report Status PENDING  Incomplete    Please note: You were cared for by a hospitalist during your hospital stay. Once you are discharged, your primary care physician will handle any further medical issues. Please note that NO REFILLS for any discharge medications will be authorized once you are discharged, as it is imperative that you return to your primary care physician (or establish a relationship with a primary care physician if you do not have one) for your post hospital discharge needs so that they can reassess your need for medications and monitor your lab values.    Time coordinating discharge: 40 minutes  SIGNED:   Burnadette Pop, MD  [facility] 02/25/2023, 10:40 AM Pager [phone]  If 7PM-7AM, please contact night-coverage [email or web address] Password [email or web address]

## 2023-02-26 LAB — CULTURE, BLOOD (ROUTINE X 2)

## 2023-03-01 ENCOUNTER — Telehealth: Payer: Self-pay

## 2023-03-01 NOTE — Transitions of Care (Post Inpatient/ED Visit) (Signed)
   03/01/2023  Name: Michele Parrish MRN: [id number] DOB: February 05, 1966  Today's TOC FU Call Status: Today's TOC FU Call Status:: Unsuccessul Call (1st Attempt) Unsuccessful Call (1st Attempt) Date: 03/01/23  Attempted to reach the patient regarding the most recent Inpatient/ED visit.  Follow Up Plan: Additional outreach attempts will be made to reach the patient to complete the Transitions of Care (Post Inpatient/ED visit) call.   Signature   [name], RN

## 2023-03-02 ENCOUNTER — Telehealth: Payer: Self-pay

## 2023-03-02 NOTE — Transitions of Care (Post Inpatient/ED Visit) (Signed)
   03/02/2023  Name: Michele Parrish MRN: [id number] DOB: [birth year]  Today's TOC FU Call Status: Today's TOC FU Call Status:: Unsuccessful Call (2nd Attempt) Unsuccessful Call (1st Attempt) Date: 03/01/23 Unsuccessful Call (2nd Attempt) Date: 03/02/23  Attempted to reach the patient regarding the most recent Inpatient/ED visit.  Follow Up Plan: Additional outreach attempts will be made to reach the patient to complete the Transitions of Care (Post Inpatient/ED visit) call.   Signature   [name], RN

## 2023-03-02 NOTE — Telephone Encounter (Signed)
I spoke to Roy/ [facility] : [phone] regarding multiple faxes received requesting new CMN and Physician RX.  I clarified with him that they just need provider  to add [facility] code for underpads to CMN dated 11/25/2022- 11/24/2023. The provider can then date, initial/sign the document.  The CMN was updated as requested and faxed back to [facility].

## 2023-03-03 ENCOUNTER — Telehealth: Payer: Self-pay

## 2023-03-03 NOTE — Transitions of Care (Post Inpatient/ED Visit) (Signed)
   03/03/2023  Name: Michele Parrish MRN: [id number] DOB: October 21, 1966  Today's TOC FU Call Status: Today's TOC FU Call Status:: Unsuccessful Call (3rd Attempt) Unsuccessful Call (1st Attempt) Date: 03/01/23 Unsuccessful Call (2nd Attempt) Date: 03/02/23 Unsuccessful Call (3rd Attempt) Date: 03/03/23  Attempted to reach the patient regarding the most recent Inpatient/ED visit.  Follow Up Plan: No further outreach attempts will be made at this time. We have been unable to contact the patient. Letter sent to patient requesting she contact the clinic to schedule a follow up appointment as we have not been able to reach her,   Signature [name], RN

## 2023-03-04 ENCOUNTER — Telehealth: Payer: Self-pay

## 2023-03-04 NOTE — Telephone Encounter (Signed)
Opened in error

## 2023-03-04 NOTE — Telephone Encounter (Signed)
Michele Parrish calling from [facility] I calling to report that she has not received the fax with the updated CMN updated. Please advise CB- [phone] FAX- [phone]

## 2023-03-11 ENCOUNTER — Telehealth: Payer: Self-pay

## 2023-03-11 NOTE — Telephone Encounter (Signed)
Copied from CRM [phone]. Topic: General - Other >> Mar 11, 2023 11:59 AM Turkey B wrote: Reason for CRM: pt called in about getting bed pads from [facility] she needs asap. Pleasd cal back

## 2023-03-12 ENCOUNTER — Telehealth: Payer: Self-pay | Admitting: Critical Care Medicine

## 2023-03-12 NOTE — Telephone Encounter (Signed)
Michele Parrish with [facility] is calling in because the forms they received back are missing some information. Michele Parrish says at the top of the page it needs to have a recipient ID as well as the physicians initials and date. Then he needs all the forms faxed over to [phone]

## 2023-03-12 NOTE — Telephone Encounter (Signed)
Once provider comes back Tuesday I will have paperwork ready to corrected

## 2023-03-12 NOTE — Telephone Encounter (Signed)
Will we need to put in orders for thsi ?

## 2023-03-16 NOTE — Telephone Encounter (Signed)
I called the patient and explained that we just faxed additional information to [facility] for her order.   She is in need of the under pads as soon as possible. I told her that [employer] sometimes has donated bed pads. She said her friend can check and I text her the number for [employer] as she requested   She said that she dropped off papers yesterday for Dr Delford Field to sign.  They are due 03/27/2023 and she is anxious to get them back.    She also told me that she had procedures on both of her eyes and she has some vision back in her left eye.  She said that the surgery on her right eye did not go as well but that may be due to her problems with getting to the follow up appointments after the surgery.  She is not giving up hope that something more can be done for the right eye.

## 2023-03-16 NOTE — Telephone Encounter (Signed)
Updated form with recipient ID faxed back to [facility]

## 2023-03-17 NOTE — Telephone Encounter (Signed)
Called patient and will leave paperwork at from for pick up

## 2023-03-18 NOTE — Telephone Encounter (Signed)
Opened in error

## 2023-03-30 DIAGNOSIS — H4043X3 Glaucoma secondary to eye inflammation, bilateral, severe stage: Secondary | ICD-10-CM | POA: Insufficient documentation

## 2023-03-30 DIAGNOSIS — Z79899 Other long term (current) drug therapy: Secondary | ICD-10-CM | POA: Insufficient documentation

## 2023-03-30 DIAGNOSIS — H3581 Retinal edema: Secondary | ICD-10-CM | POA: Insufficient documentation

## 2023-03-30 DIAGNOSIS — Z961 Presence of intraocular lens: Secondary | ICD-10-CM | POA: Insufficient documentation

## 2023-03-31 ENCOUNTER — Inpatient Hospital Stay: Payer: Medicaid Other | Admitting: Critical Care Medicine

## 2023-03-31 ENCOUNTER — Telehealth: Payer: Self-pay

## 2023-03-31 NOTE — Telephone Encounter (Signed)
Completed CAP referral faxed to [place] [facility]

## 2023-04-27 ENCOUNTER — Encounter: Payer: Self-pay | Admitting: Critical Care Medicine

## 2023-04-27 ENCOUNTER — Other Ambulatory Visit: Payer: Self-pay

## 2023-04-27 ENCOUNTER — Telehealth: Payer: Self-pay

## 2023-04-27 ENCOUNTER — Other Ambulatory Visit (HOSPITAL_COMMUNITY): Payer: Self-pay

## 2023-04-27 ENCOUNTER — Ambulatory Visit: Payer: Medicaid Other | Attending: Critical Care Medicine | Admitting: Critical Care Medicine

## 2023-04-27 VITALS — BP 111/78 | HR 97 | Wt 138.0 lb

## 2023-04-27 DIAGNOSIS — J9611 Chronic respiratory failure with hypoxia: Secondary | ICD-10-CM

## 2023-04-27 DIAGNOSIS — F321 Major depressive disorder, single episode, moderate: Secondary | ICD-10-CM | POA: Diagnosis not present

## 2023-04-27 DIAGNOSIS — H4043X3 Glaucoma secondary to eye inflammation, bilateral, severe stage: Secondary | ICD-10-CM

## 2023-04-27 DIAGNOSIS — R0989 Other specified symptoms and signs involving the circulatory and respiratory systems: Secondary | ICD-10-CM | POA: Diagnosis not present

## 2023-04-27 DIAGNOSIS — I272 Pulmonary hypertension, unspecified: Secondary | ICD-10-CM

## 2023-04-27 DIAGNOSIS — J4489 Other specified chronic obstructive pulmonary disease: Secondary | ICD-10-CM | POA: Diagnosis not present

## 2023-04-27 DIAGNOSIS — I5032 Chronic diastolic (congestive) heart failure: Secondary | ICD-10-CM

## 2023-04-27 DIAGNOSIS — H30031 Focal chorioretinal inflammation, peripheral, right eye: Secondary | ICD-10-CM

## 2023-04-27 DIAGNOSIS — R918 Other nonspecific abnormal finding of lung field: Secondary | ICD-10-CM | POA: Diagnosis not present

## 2023-04-27 DIAGNOSIS — R45851 Suicidal ideations: Secondary | ICD-10-CM

## 2023-04-27 MED ORDER — BREZTRI AEROSPHERE 160-9-4.8 MCG/ACT IN AERO
2.0000 | INHALATION_SPRAY | Freq: Two times a day (BID) | RESPIRATORY_TRACT | 11 refills | Status: DC
  Filled 2023-04-27: qty 10.7, 30d supply, fill #0

## 2023-04-27 MED ORDER — GABAPENTIN 300 MG PO CAPS
300.0000 mg | ORAL_CAPSULE | Freq: Every day | ORAL | 1 refills | Status: DC
  Filled 2023-04-27: qty 90, 90d supply, fill #0
  Filled 2023-08-20: qty 90, 90d supply, fill #1

## 2023-04-27 MED ORDER — ALBUTEROL SULFATE HFA 108 (90 BASE) MCG/ACT IN AERS
2.0000 | INHALATION_SPRAY | RESPIRATORY_TRACT | 5 refills | Status: AC | PRN
Start: 2023-04-27 — End: 2024-01-04
  Filled 2023-04-27: qty 18, 17d supply, fill #0
  Filled 2023-06-22: qty 18, 17d supply, fill #1
  Filled 2023-08-20: qty 18, 17d supply, fill #2

## 2023-04-27 MED ORDER — SERTRALINE HCL 50 MG PO TABS
50.0000 mg | ORAL_TABLET | Freq: Every day | ORAL | 0 refills | Status: DC
Start: 2023-04-27 — End: 2023-06-16
  Filled 2023-04-27: qty 30, 30d supply, fill #0

## 2023-04-27 MED ORDER — IPRATROPIUM-ALBUTEROL 0.5-2.5 (3) MG/3ML IN SOLN
3.0000 mL | Freq: Four times a day (QID) | RESPIRATORY_TRACT | 2 refills | Status: AC | PRN
Start: 2023-04-27 — End: 2024-01-04
  Filled 2023-04-27: qty 360, 30d supply, fill #0

## 2023-04-27 NOTE — Assessment & Plan Note (Signed)
Significant pulmonary hypertension on CT scan with chronic respiratory failure plan to refer to pulmonary medicine here in [facility]

## 2023-04-27 NOTE — Assessment & Plan Note (Signed)
Patient is on Spiriva and Advair will switch to [place] 2 puffs twice daily

## 2023-04-27 NOTE — Patient Instructions (Signed)
Lab to be done today  Stop advair and spiriva and start Breztri two puffs twice daily  No other changes  Refill to lung doctor  Return Dr Delford Field 4 months

## 2023-04-27 NOTE — Assessment & Plan Note (Signed)
Prior pulmonary nodule seen on imaging likely unchanged patient is high risk for any sampling of the nodules

## 2023-04-27 NOTE — Progress Notes (Signed)
Established Patient Office Visit  Subjective   Patient ID: Michele Parrish, female    DOB: 14-Dec-1965  Age: 57 y.o. 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  Patient also was significantly incontinent of urine.  She has not been seen in 2 years.  She notes progressive loss of vision recently.  She has swelling and redness in the eyes.  She has had headaches as well.  Her shortness of breath is at baseline.  She is incontinent of urine with the furosemide.  She cannot get from bed to chair without wetting herself.  Another important issue is that she has had increased depression over the past several months and is building to the point where she is thought of harm or self by use of poison.  The patient has lost her dog she thinks someone stole her dog about a week ago this is caused her to have more depression as well.  Patient denies any pain at this time.  Patient does have inhaled medications but does need refills on medications.  She does live by herself at this time.  She is asking for personal care services as well as incontinence supplies.  The patient is accompanied by her friend who is also a Reverend administer in her church.  He is willing to take her to mental health for evaluation today.  Note on room air oxygen is 94%.  Note she did not bring her oxygen with her.  On arrival blood pressure 125/82.  04/27/23 Patient seen as transition of care visit but is too far out for a transition of care coding.  Patient admitted between end of April and 1 May for COPD exacerbation and multilobar pneumonia.  Below is documentation of this visit.  Patient still maintains 5 to 6 L nasal cannula at baseline.  She has seen pulmonary in [place] in the  past wishes to transfer care to [place].  She has significant emphysema pulmonary hypertension and chronic hypoxia.  Patient states she is still dyspneic.  She has minimal productive cough.  She has been found to have pulmonary nodules no biopsy has been obtained.  These have been stable over time.  She will maintain shortness of breath with exertion.  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  She is on multiple eyedrops.  She is followed by [facility] care in [place].  She has improvement in left eye vision but the right eye is unchanged. Patient also on CellCept.  Patient still has mood swings but improved on sertraline.  Patient is no longer suicidal. 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  Noted to be saturating 76% on home 5 L of oxygen.  On condition, noted to be febrile, tachycardic, tachypneic and had to put on BiPAP.  Chest x-ray showed right middle lobe pneumonia.  Meta-pneumovirus came out  to be positive.  Patient was admitted for the management of acute on chronic hypoxic respiratory secondary to multifocal pneumonia, COPD exacerbation.  Currently her respiratory status is stable.  She is at baseline oxygen requirement. Medically stable for dc to home today.   Following problems were addressed during the hospitalization:     Sepsis secondary to multifocal pneumonia: Presented with fever, tachycardia, tachypnea.  Respiratory viral panel  positive for metapneumovirus.  Sepsis physiology has resolved.   Multifocal pneumonia secondary to metapneumovirus/COPD exacerbation/acute on chronic hypoxic respiratory failure: On 5 to 6 L of oxygen chronically at baseline secondary to COPD.  Presented with severe respiratory distress, had to be put on BiPAP on presentation.  Currently weaned to baseline oxygen. Started on IV steroids, antibiotics.  Completed antibiotics course.  Steroids changed to oral.  Continue bronchodilators as needed. She follows with pulmonology for COPD.   Anemia of chronic disease/iron deficiency anemia: FOBT negative.  No evidence of bleeding.  Iron panel showed low iron.  Given IV iron.   Hypokalemia: Supplemented with potassium   Chronic diastolic CHF: Currently euvolemic.  Echo showed EF of 60 to 65%, no wall motion abnormality, grade 1 diastolic dysfunction.   History of pulmonary hypertension: Likely secondary to advanced COPD.  Follow-up with pulmonology as an outpatient   Legal blindness/ocular syphilis: Takes CellCept, prednisone ophthalmic suspension.  Follows with ophthalmology.  Had multiple eye surgeries   Tobacco use: Counseled for cessation.  Continue nicotine patch   Disposition: Patient is currently homeless, house is on foreclosure.  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 09/23/2022    Corneal abrasion, left 09/10/2021   Depression 09/10/2021   Prolonged QT interval 09/10/2021   Absence of bladder continence 07/22/2020   Chronic respiratory failure with hypoxia (HCC) 03/26/2020   CKD (chronic kidney disease), 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   History of tobacco use 02/22/2020   Moderate to severe pulmonary hypertension (HCC) 02/22/2020   Pulmonary nodules 02/22/2020   Chronic diastolic heart failure (HCC) 02/22/2020   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   Dependence on continuous supplemental oxygen 10/27/2019   Past Medical History:  Diagnosis Date   Acute exacerbation of CHF (congestive heart failure) (HCC) 02/22/2020   Bronchitis    Bronchopneumonia 09/07/2021   Pericardial effusion    Pulmonary hypertension (HCC)    Suicidal ideation 06/15/2022   Past Surgical History:  Procedure Laterality Date   ABDOMINAL HYSTERECTOMY     EYE SURGERY     RIGHT HEART CATH N/A 02/23/2020   Procedure: RIGHT HEART CATH;  Surgeon: Iran Ouch, MD;  Location: [facility] INVASIVE CV [facility];  Service: Cardiovascular;  Laterality: N/A;   Social History   Tobacco Use   Smoking status: Former    Packs/day: 1.50    Years: 42.00    Additional  pack years: 0.00    Total pack years: 63.00    Types: Cigarettes    Quit date: 02/18/2020    Years since quitting: 3.1   Smokeless tobacco: Never  Vaping Use   Vaping Use: Never used  Substance Use Topics   Alcohol use: Yes    Comment: 2 beers per day   Drug use: No   Social History   Socioeconomic History   Marital status: Single    Spouse name: Not on file   Number of children: Not on file   Years of education: Not on file   Highest education level: Not on file  Occupational History   Not on file  Tobacco Use   Smoking status: Former    Packs/day: 1.50    Years: 42.00    Additional pack years: 0.00    Total pack years: 63.00    Types: Cigarettes    Quit date: 02/18/2020    Years since quitting: 3.1   Smokeless tobacco: Never   Vaping Use   Vaping Use: Never used  Substance and Sexual Activity   Alcohol use: Yes    Comment: 2 beers per day   Drug use: No   Sexual activity: Not on file  Other Topics Concern   Not on file  Social History Narrative   Not on file   Social Determinants of Health   Financial Resource Strain: Not on file  Food Insecurity: Food Insecurity Present (02/18/2023)   Hunger Vital Sign    Worried About Running Out of Food in the Last Year: Sometimes true    Ran Out of Food in the Last Year: Sometimes true  Transportation Needs: Unmet Transportation Needs (02/18/2023)   PRAPARE - Administrator, Civil Service (Medical): No    Lack of Transportation (Non-Medical): Yes  Physical Activity: Not on file  Stress: Not on file  Social Connections: Not on file  Intimate Partner Violence: Not At Risk (02/18/2023)   Humiliation, Afraid, Rape, and Kick questionnaire    Fear of Current or Ex-Partner: No    Emotionally Abused: No    Physically Abused: No    Sexually Abused: No   Family Status  Relation Name Status   Father  (Not Specified)   Mother  (Not Specified)   Family History  Problem Relation Age of Onset   Prostate cancer Father    Stroke Father    Multiple sclerosis Mother    No Known Allergies    Review of Systems  Constitutional:  Negative for chills, diaphoresis, fever, malaise/fatigue and weight loss.  HENT:  Negative for congestion, ear discharge, ear pain, hearing loss, nosebleeds, sore throat and tinnitus.   Eyes:  Positive for blurred vision. Negative for double vision, photophobia and discharge.  Respiratory:  Positive for shortness of breath. Negative for cough, hemoptysis, sputum production, wheezing and stridor.        No excess mucus  Cardiovascular:  Negative for chest pain, palpitations, orthopnea, claudication, leg swelling and PND.  Gastrointestinal:  Negative for abdominal pain, blood in stool, constipation, diarrhea, heartburn, melena, nausea  and vomiting.  Genitourinary:  Negative for dysuria, flank pain, frequency, hematuria and urgency.       Incontinent of urine  Musculoskeletal:  Negative for back pain, falls, joint pain, myalgias and neck pain.  Skin:  Negative for itching and rash.  Neurological:  Negative for dizziness, tingling, tremors, sensory change, speech change, focal weakness, seizures, loss of consciousness, weakness and headaches.  Endo/Heme/Allergies:  Negative for environmental allergies and polydipsia. Does not bruise/bleed easily.  Psychiatric/Behavioral:  Positive for depression. Negative for hallucinations, memory loss, substance abuse and suicidal ideas. The patient is nervous/anxious. The patient does not have insomnia.   All other systems reviewed and are negative.     Objective:     BP 111/78 (BP Location: Left Arm, Patient Position: Sitting, Cuff Size: Normal)   Pulse 97   Wt 138 lb (62.6 kg)   SpO2 (!) 88% Comment: currently on 6 liters of oxygen  BMI 25.24 kg/m  BP Readings from Last 3 Encounters:  04/27/23 111/78  02/25/23 92/70  06/15/22 125/82   Wt Readings from Last 3 Encounters:  04/27/23 138 lb (62.6 kg)  02/18/23 140 lb (63.5 kg)  06/15/22 132 lb 6.4 oz (60.1 kg)      Physical Exam Vitals reviewed.  Constitutional:      Appearance: Normal appearance. She is well-developed. She is not diaphoretic.  HENT:     Head: Normocephalic and atraumatic.     Nose: No nasal deformity, septal deviation, mucosal edema or rhinorrhea.     Right Sinus: No maxillary sinus tenderness or frontal sinus tenderness.     Left Sinus: No maxillary sinus tenderness or frontal sinus tenderness.     Mouth/Throat:     Pharynx: No oropharyngeal exudate.  Eyes:     General: No scleral icterus.    Conjunctiva/sclera: Conjunctivae normal.     Pupils: Pupils are equal, round, and reactive to light.  Neck:     Thyroid: No thyromegaly.     Vascular: No carotid bruit or JVD.     Trachea: Trachea normal.  No tracheal tenderness or tracheal deviation.  Cardiovascular:     Rate and Rhythm: Normal rate and regular rhythm.     Chest Wall: PMI is not displaced.     Pulses: Normal pulses. No decreased pulses.     Heart sounds: Normal heart sounds, S1 normal and S2 normal. Heart sounds not distant. No murmur heard.    No systolic murmur is present.     No diastolic murmur is present.     No friction rub. No gallop. No S3 or S4 sounds.  Pulmonary:     Effort: Pulmonary effort is normal. No tachypnea, accessory muscle usage or respiratory distress.     Breath sounds: No stridor. No decreased breath sounds, wheezing, rhonchi or rales.     Comments: Distant breath sounds Chest:     Chest wall: No tenderness.  Abdominal:     General: Bowel sounds are normal. There is no distension.     Palpations: Abdomen is soft. Abdomen is not rigid.     Tenderness: There is no abdominal tenderness. There is no guarding or rebound.  Musculoskeletal:        General: Normal range of motion.     Cervical back: Normal range of motion and neck supple. No edema, erythema or rigidity. No muscular tenderness. Normal range of motion.  Lymphadenopathy:     Head:     Right side of head: No submental or submandibular adenopathy.     Left side of head: No submental or submandibular adenopathy.     Cervical: No cervical adenopathy.  Skin:    General: Skin is warm and dry.     Coloration: Skin is not pale.     Findings: No rash.     Nails: There is no clubbing.  Neurological:     Mental Status: She is alert and oriented  to person, place, and time.     Sensory: No sensory deficit.  Psychiatric:        Attention and Perception: Attention and perception normal.        Mood and Affect: Mood is not depressed. Affect is tearful.        Speech: Speech normal.        Behavior: Behavior normal. Behavior is not withdrawn.        Thought Content: Thought content does not include suicidal ideation. Thought content does not include  suicidal plan.        Cognition and Memory: Cognition and memory normal.        Judgment: Judgment normal.      No results found for any visits on 04/27/23.  Last CBC Lab Results  Component Value Date   WBC 9.9 02/24/2023   HGB 10.5 (L) 02/24/2023   HCT 34.9 (L) 02/24/2023   MCV 87.5 02/24/2023   MCH 26.3 02/24/2023   RDW 13.4 02/24/2023   PLT 292 02/24/2023   Last metabolic panel Lab Results  Component Value Date   GLUCOSE 97 02/25/2023   NA 137 02/25/2023   K 4.0 02/25/2023   CL 101 02/25/2023   CO2 28 02/25/2023   BUN 16 02/25/2023   CREATININE 0.66 02/25/2023   GFRNONAA >60 02/25/2023   CALCIUM 8.6 (L) 02/25/2023   PHOS 4.1 09/11/2021   PROT 7.6 02/20/2023   ALBUMIN 3.2 (L) 02/20/2023   LABGLOB 4.2 06/15/2022   AGRATIO 1.1 (L) 06/15/2022   BILITOT 0.2 (L) 02/20/2023   ALKPHOS 50 02/20/2023   AST 28 02/20/2023   ALT 15 02/20/2023   ANIONGAP 8 02/25/2023   Last lipids Lab Results  Component Value Date   CHOL 119 02/21/2020   HDL 37 (L) 02/21/2020   LDLCALC 65 02/21/2020   TRIG 85 02/21/2020   CHOLHDL 3.2 02/21/2020   Last hemoglobin A1c Lab Results  Component Value Date   HGBA1C 6.4 (H) 02/21/2020   Last thyroid functions Lab Results  Component Value Date   TSH 2.520 06/15/2022   T4TOTAL 6.5 06/15/2022      The ASCVD Risk score (Arnett DK, et al., 2019) failed to calculate for the following reasons:   Cannot find a previous HDL lab   Cannot find a previous total cholesterol lab    Assessment & Plan:   Problem List Items Addressed This Visit       Cardiovascular and Mediastinum   Moderate to severe pulmonary hypertension (HCC)    Significant pulmonary hypertension on CT scan with chronic respiratory failure plan to refer to pulmonary medicine here in [facility]      Chronic diastolic heart failure (HCC)   Relevant Orders   CBC with Differential/Platelet   Comprehensive metabolic panel     Respiratory   COPD with chronic bronchitis     Patient is on Spiriva and Advair will switch to [place] 2 puffs twice daily      Relevant Medications   Budeson-Glycopyrrol-Formoterol (BREZTRI AEROSPHERE) 160-9-4.8 MCG/ACT AERO   ipratropium-albuterol (DUONEB) 0.5-2.5 (3) MG/3ML SOLN   albuterol (VENTOLIN HFA) 108 (90 Base) MCG/ACT inhaler   Other Relevant Orders   Ambulatory referral to Pulmonology   Pulmonary nodules    Prior pulmonary nodule seen on imaging likely unchanged patient is high risk for any sampling of the nodules      Chronic respiratory failure with hypoxia (HCC)   Relevant Orders   Ambulatory referral to Pulmonology     Other  Depression    Refill and continue sertraline      Relevant Medications   sertraline (ZOLOFT) 50 MG tablet   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, severe stage    Management per ophthalmology      Relevant Medications   dorzolamide-timolol (COSOPT) 2-0.5 % ophthalmic solution   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    Shan Levans, MD

## 2023-04-27 NOTE — Assessment & Plan Note (Signed)
Suicidal ideation resolved

## 2023-04-27 NOTE — Assessment & Plan Note (Signed)
Management per ophthalmology.   

## 2023-04-27 NOTE — Assessment & Plan Note (Signed)
Refill and continue sertraline

## 2023-04-27 NOTE — Telephone Encounter (Signed)
Patient in the office today. Patient requested that the office fax her completes Dental form and  medication list to [facility] to [phone]. Phone : # [phone]. Medical release form signed by patient.  Form completed and medication list faxed.

## 2023-04-28 ENCOUNTER — Telehealth: Payer: Self-pay

## 2023-04-28 LAB — CBC WITH DIFFERENTIAL/PLATELET
Basophils Absolute: 0.1 10*3/uL (ref 0.0–0.2)
Basos: 1 %
EOS (ABSOLUTE): 0.1 10*3/uL (ref 0.0–0.4)
Eos: 1 %
Hematocrit: 41.2 % (ref 34.0–46.6)
Hemoglobin: 12.5 g/dL (ref 11.1–15.9)
Immature Grans (Abs): 0 10*3/uL (ref 0.0–0.1)
Immature Granulocytes: 0 %
Lymphocytes Absolute: 3.7 10*3/uL — ABNORMAL HIGH (ref 0.7–3.1)
Lymphs: 35 %
MCH: 26.5 pg — ABNORMAL LOW (ref 26.6–33.0)
MCHC: 30.3 g/dL — ABNORMAL LOW (ref 31.5–35.7)
MCV: 88 fL (ref 79–97)
Monocytes Absolute: 0.6 10*3/uL (ref 0.1–0.9)
Monocytes: 5 %
Neutrophils Absolute: 6.3 10*3/uL (ref 1.4–7.0)
Neutrophils: 58 %
Platelets: 257 10*3/uL (ref 150–450)
RBC: 4.71 x10E6/uL (ref 3.77–5.28)
RDW: 14 % (ref 11.7–15.4)
WBC: 10.8 10*3/uL (ref 3.4–10.8)

## 2023-04-28 LAB — COMPREHENSIVE METABOLIC PANEL
ALT: 11 IU/L (ref 0–32)
AST: 32 IU/L (ref 0–40)
Albumin: 4.5 g/dL (ref 3.8–4.9)
Alkaline Phosphatase: 91 IU/L (ref 44–121)
BUN/Creatinine Ratio: 11 (ref 9–23)
BUN: 9 mg/dL (ref 6–24)
Bilirubin Total: 0.6 mg/dL (ref 0.0–1.2)
CO2: 25 mmol/L (ref 20–29)
Calcium: 9.2 mg/dL (ref 8.7–10.2)
Chloride: 100 mmol/L (ref 96–106)
Creatinine, Ser: 0.84 mg/dL (ref 0.57–1.00)
Globulin, Total: 3 g/dL (ref 1.5–4.5)
Glucose: 105 mg/dL — ABNORMAL HIGH (ref 70–99)
Potassium: 3.8 mmol/L (ref 3.5–5.2)
Sodium: 143 mmol/L (ref 134–144)
Total Protein: 7.5 g/dL (ref 6.0–8.5)
eGFR: 82 mL/min/{1.73_m2} (ref >59)

## 2023-04-28 NOTE — Telephone Encounter (Signed)
-----   Message from Storm Frisk, MD sent at 04/28/2023  5:44 AM EDT ----- Let pt know all labs normal

## 2023-04-28 NOTE — Telephone Encounter (Signed)
Pt was called and is aware of results, DOB was confirmed.  ?

## 2023-04-28 NOTE — Progress Notes (Signed)
Let pt know all labs normal

## 2023-04-30 ENCOUNTER — Other Ambulatory Visit: Payer: Self-pay

## 2023-04-30 ENCOUNTER — Other Ambulatory Visit: Payer: Self-pay | Admitting: Critical Care Medicine

## 2023-04-30 ENCOUNTER — Other Ambulatory Visit (HOSPITAL_COMMUNITY): Payer: Self-pay

## 2023-04-30 MED ORDER — NICOTINE POLACRILEX 4 MG MT LOZG
4.0000 mg | LOZENGE | OROMUCOSAL | 0 refills | Status: DC | PRN
  Filled 2023-04-30 (×2): qty 72, 30d supply, fill #0

## 2023-04-30 NOTE — Telephone Encounter (Signed)
I have no history to diagnose cause

## 2023-04-30 NOTE — Telephone Encounter (Signed)
Pt was called and informed of PCP response. 

## 2023-05-10 ENCOUNTER — Other Ambulatory Visit: Payer: Self-pay

## 2023-05-17 ENCOUNTER — Other Ambulatory Visit (HOSPITAL_COMMUNITY): Payer: Self-pay

## 2023-06-09 ENCOUNTER — Other Ambulatory Visit: Payer: Self-pay

## 2023-06-10 ENCOUNTER — Other Ambulatory Visit (HOSPITAL_COMMUNITY): Payer: Self-pay

## 2023-06-16 ENCOUNTER — Other Ambulatory Visit: Payer: Self-pay | Admitting: Critical Care Medicine

## 2023-06-16 ENCOUNTER — Other Ambulatory Visit: Payer: Self-pay

## 2023-06-16 DIAGNOSIS — F321 Major depressive disorder, single episode, moderate: Secondary | ICD-10-CM

## 2023-06-16 MED ORDER — SERTRALINE HCL 50 MG PO TABS
50.0000 mg | ORAL_TABLET | Freq: Every day | ORAL | 1 refills | Status: DC
Start: 2023-06-16 — End: 2023-12-08
  Filled 2023-06-16 (×2): qty 90, 90d supply, fill #0

## 2023-06-17 ENCOUNTER — Other Ambulatory Visit (HOSPITAL_COMMUNITY): Payer: Self-pay

## 2023-06-22 ENCOUNTER — Other Ambulatory Visit: Payer: Self-pay

## 2023-06-22 ENCOUNTER — Other Ambulatory Visit: Payer: Self-pay | Admitting: Critical Care Medicine

## 2023-06-22 ENCOUNTER — Other Ambulatory Visit: Payer: Self-pay | Admitting: Pharmacist

## 2023-06-22 ENCOUNTER — Other Ambulatory Visit (HOSPITAL_COMMUNITY): Payer: Self-pay

## 2023-06-22 MED ORDER — BUDESONIDE-FORMOTEROL FUMARATE 160-4.5 MCG/ACT IN AERO
2.0000 | INHALATION_SPRAY | Freq: Two times a day (BID) | RESPIRATORY_TRACT | 3 refills | Status: DC
  Filled 2023-06-22: qty 10.2, fill #0
  Filled 2023-06-22: qty 10.2, 30d supply, fill #0
  Filled 2023-08-20: qty 10.2, 30d supply, fill #1

## 2023-06-22 MED ORDER — NICOTINE POLACRILEX 4 MG MT LOZG
4.0000 mg | LOZENGE | OROMUCOSAL | 0 refills | Status: DC | PRN
  Filled 2023-06-22: qty 72, 30d supply, fill #0

## 2023-06-22 MED ORDER — UMECLIDINIUM BROMIDE 62.5 MCG/ACT IN AEPB
1.0000 | INHALATION_SPRAY | Freq: Every day | RESPIRATORY_TRACT | 6 refills | Status: DC
  Filled 2023-06-22 (×2): qty 30, 30d supply, fill #0
  Filled 2023-08-20: qty 30, 30d supply, fill #1

## 2023-06-22 NOTE — Progress Notes (Signed)
Breztri not covered by [employer], despite PA. Pt was on Advair HFA + Spiriva with suboptimal control. Trelegy is not covered either. Will change to Symbicort + Incruse.

## 2023-06-23 ENCOUNTER — Other Ambulatory Visit (HOSPITAL_COMMUNITY): Payer: Self-pay

## 2023-06-23 ENCOUNTER — Other Ambulatory Visit: Payer: Self-pay

## 2023-06-24 ENCOUNTER — Other Ambulatory Visit: Payer: Self-pay

## 2023-07-12 ENCOUNTER — Telehealth: Payer: Self-pay

## 2023-07-12 NOTE — Telephone Encounter (Signed)
PCS form received in the office. Call placed to patient to discuss need for change. Unable to reach message left to return call.

## 2023-07-12 NOTE — Telephone Encounter (Addendum)
Spoke with patient. Advised that she only needs to call [employer] at [phone]. To inform them her Aide is switching companies and she would like her to continue providing services.

## 2023-07-12 NOTE — Telephone Encounter (Signed)
Pt is calling stating the reason for the PCS form is because her Aid is switching companies.

## 2023-07-13 NOTE — Telephone Encounter (Signed)
The patient called back wanting to speak with Cassandra as soon as possible about new information regarding a form. Please assist patient further

## 2023-07-14 ENCOUNTER — Telehealth: Payer: Self-pay

## 2023-07-14 NOTE — Telephone Encounter (Signed)
Pt called back to receive the confirmation number for the recent fax of her PCS form, pt wants to speak to Cassandra.   Best contact: [phone]

## 2023-07-14 NOTE — Telephone Encounter (Signed)
Spoke with patient. Patient would like know when PCS form are faxed. Advised that form as been given to provider for signature and would like her know when it is faxed.

## 2023-07-14 NOTE — Telephone Encounter (Signed)
Copied from CRM [id number]. Topic: General - Other >> Jul 13, 2023  1:48 PM Phill Myron wrote: Ezzard Flax, RN Please call Ms. Doggett. She would like the status on the PCS form paper work discussed earlier

## 2023-07-15 NOTE — Telephone Encounter (Addendum)
Spoke with patient . Patient voiced that she has spoken with [facility]. Her Case worker will handle her PCS changes

## 2023-07-19 ENCOUNTER — Other Ambulatory Visit: Payer: Self-pay

## 2023-07-19 ENCOUNTER — Other Ambulatory Visit: Payer: Self-pay | Admitting: Critical Care Medicine

## 2023-07-20 ENCOUNTER — Other Ambulatory Visit: Payer: Self-pay

## 2023-07-20 ENCOUNTER — Other Ambulatory Visit (HOSPITAL_COMMUNITY): Payer: Self-pay

## 2023-07-20 MED ORDER — NICOTINE POLACRILEX 4 MG MT LOZG
4.0000 mg | LOZENGE | OROMUCOSAL | 0 refills | Status: DC | PRN
  Filled 2023-07-20 (×2): qty 72, 30d supply, fill #0

## 2023-07-20 NOTE — Telephone Encounter (Signed)
Requested Prescriptions  Pending Prescriptions Disp Refills   nicotine polacrilex (SM NICOTINE POLACRILEX) 4 MG lozenge 72 lozenge 0    Sig: Dissolve 1 lozenge (4 mg total) by mouth as needed for smoking cessation.     Psychiatry:  Drug Dependence Therapy Passed - 07/19/2023  9:37 AM      Passed - Valid encounter within last 12 months    Recent Outpatient Visits           2 months ago COPD with chronic bronchitis   [facility] Storm Frisk, MD   1 year ago Suicidal ideation   [facility] Storm Frisk, MD   1 year ago Chronic respiratory failure with hypoxia [facility])   [facility] Rigg, NP   2 years ago Other urinary incontinence   [facility] Storm Frisk, MD   3 years ago Numbness and tingling of both feet   [facility] Storm Frisk, MD       Future Appointments             In 2 weeks Hoy Register, MD [facility]

## 2023-07-21 ENCOUNTER — Telehealth: Payer: Self-pay

## 2023-07-21 NOTE — Telephone Encounter (Signed)
Copied from CRM [id number]. Topic: General - Other >> Jul 21, 2023  3:32 PM Everette C wrote: Reason for CRM: The patient has returned a missed phone call from C/ Manson Passey RN   Please contact the patient further when possible

## 2023-08-04 ENCOUNTER — Ambulatory Visit: Payer: Medicaid Other | Admitting: Family Medicine

## 2023-08-18 NOTE — Telephone Encounter (Signed)
Pt calling back to speak w/ Cassandra.  She said she did not get reminder or the 10/09 appt.  She was out of minutes on her phone. Pt states her case worker wants her to go to an assisted living.  Pt states living alone is hard and she is at the point she needs some help.   Please advise.

## 2023-08-20 ENCOUNTER — Other Ambulatory Visit: Payer: Self-pay

## 2023-08-20 ENCOUNTER — Other Ambulatory Visit: Payer: Self-pay | Admitting: Critical Care Medicine

## 2023-08-20 DIAGNOSIS — I5032 Chronic diastolic (congestive) heart failure: Secondary | ICD-10-CM

## 2023-08-20 NOTE — Telephone Encounter (Signed)
Spoke with patient on the 08/18/2023 scheduled for 09/01/2023

## 2023-08-27 ENCOUNTER — Other Ambulatory Visit: Payer: Self-pay | Admitting: Critical Care Medicine

## 2023-08-27 ENCOUNTER — Other Ambulatory Visit: Payer: Self-pay

## 2023-08-27 ENCOUNTER — Other Ambulatory Visit (HOSPITAL_COMMUNITY): Payer: Self-pay

## 2023-08-27 DIAGNOSIS — I5032 Chronic diastolic (congestive) heart failure: Secondary | ICD-10-CM

## 2023-08-27 MED ORDER — FUROSEMIDE 40 MG PO TABS
40.0000 mg | ORAL_TABLET | Freq: Two times a day (BID) | ORAL | 0 refills | Status: DC
  Filled 2023-08-27 – 2023-08-30 (×2): qty 60, 30d supply, fill #0

## 2023-08-27 NOTE — Telephone Encounter (Signed)
Requested medication (s) are due for refill today: review  Requested medication (s) are on the active medication list: no  Last refill:  06/15/22  Future visit scheduled: yes  Notes to clinic:  review for refill, dc'd from hospital   The original prescription was discontinued on 02/25/2023 by Burnadette Pop, MD for the following reason: Stop Taking at Discharge.       Requested Prescriptions  Pending Prescriptions Disp Refills   furosemide (LASIX) 40 MG tablet 60 tablet 1    Sig: Take 1 tablet (40 mg total) by mouth 2 (two) times daily.     Cardiovascular:  Diuretics - Loop Failed - 08/27/2023  9:50 AM      Failed - Mg Level in normal range and within 180 days    Magnesium  Date Value Ref Range Status  02/18/2023 1.9 1.7 - 2.4 mg/dL Final    Comment:    Performed at [facility], [address], [place], [place] [address]         Passed - K in normal range and within 180 days    Potassium  Date Value Ref Range Status  04/27/2023 3.8 3.5 - 5.2 mmol/L Final         Passed - Ca in normal range and within 180 days    Calcium  Date Value Ref Range Status  04/27/2023 9.2 8.7 - 10.2 mg/dL Final   Calcium, Ion  Date Value Ref Range Status  02/23/2020 1.17 1.15 - 1.40 mmol/L Final  02/23/2020 1.17 1.15 - 1.40 mmol/L Final         Passed - Na in normal range and within 180 days    Sodium  Date Value Ref Range Status  04/27/2023 143 134 - 144 mmol/L Final         Passed - Cr in normal range and within 180 days    Creatinine, Ser  Date Value Ref Range Status  04/27/2023 0.84 0.57 - 1.00 mg/dL Final   Creatinine,U  Date Value Ref Range Status  03/23/2008   Final   25.4 (NOTE)  Cutoff Values for Urine Drug Screen:        Drug Class           Cutoff (ng/mL)        Amphetamines            1000        Barbiturates             200        Cocaine Metabolites      300        Benzodiazepines          200        Methadone                 300        Opiates                  2000        Phencyclidine             25        Propoxyphene             300        Marijuana Metabolites     50  For medical purposes only.   Creatinine, Urine  Date Value Ref Range Status  09/11/2010 160.1 mg/dL Final         Passed - Cl in  normal range and within 180 days    Chloride  Date Value Ref Range Status  04/27/2023 100 96 - 106 mmol/L Final         Passed - Last BP in normal range    BP Readings from Last 1 Encounters:  04/27/23 111/78         Passed - Valid encounter within last 6 months    Recent Outpatient Visits           4 months ago COPD with chronic bronchitis   [place] [facility] Storm Frisk, MD   1 year ago Suicidal ideation   [place] [facility] Storm Frisk, MD   1 year ago Chronic respiratory failure with hypoxia [facility])   [place] [facility], Shea Stakes, NP   3 years ago Other urinary incontinence   [place] [facility] Storm Frisk, MD   3 years ago Numbness and tingling of both feet   [facility] Storm Frisk, MD       Future Appointments             In 5 days Hoy Register, MD [facility]

## 2023-08-28 IMAGING — DX DG CHEST 1V PORT
1 series · 1 of 1 positions shown · non-contrast
Comparison: 02/26/2020

CLINICAL DATA: Shortness of breath

EXAM:
PORTABLE CHEST 1 VIEW

[chest ap]
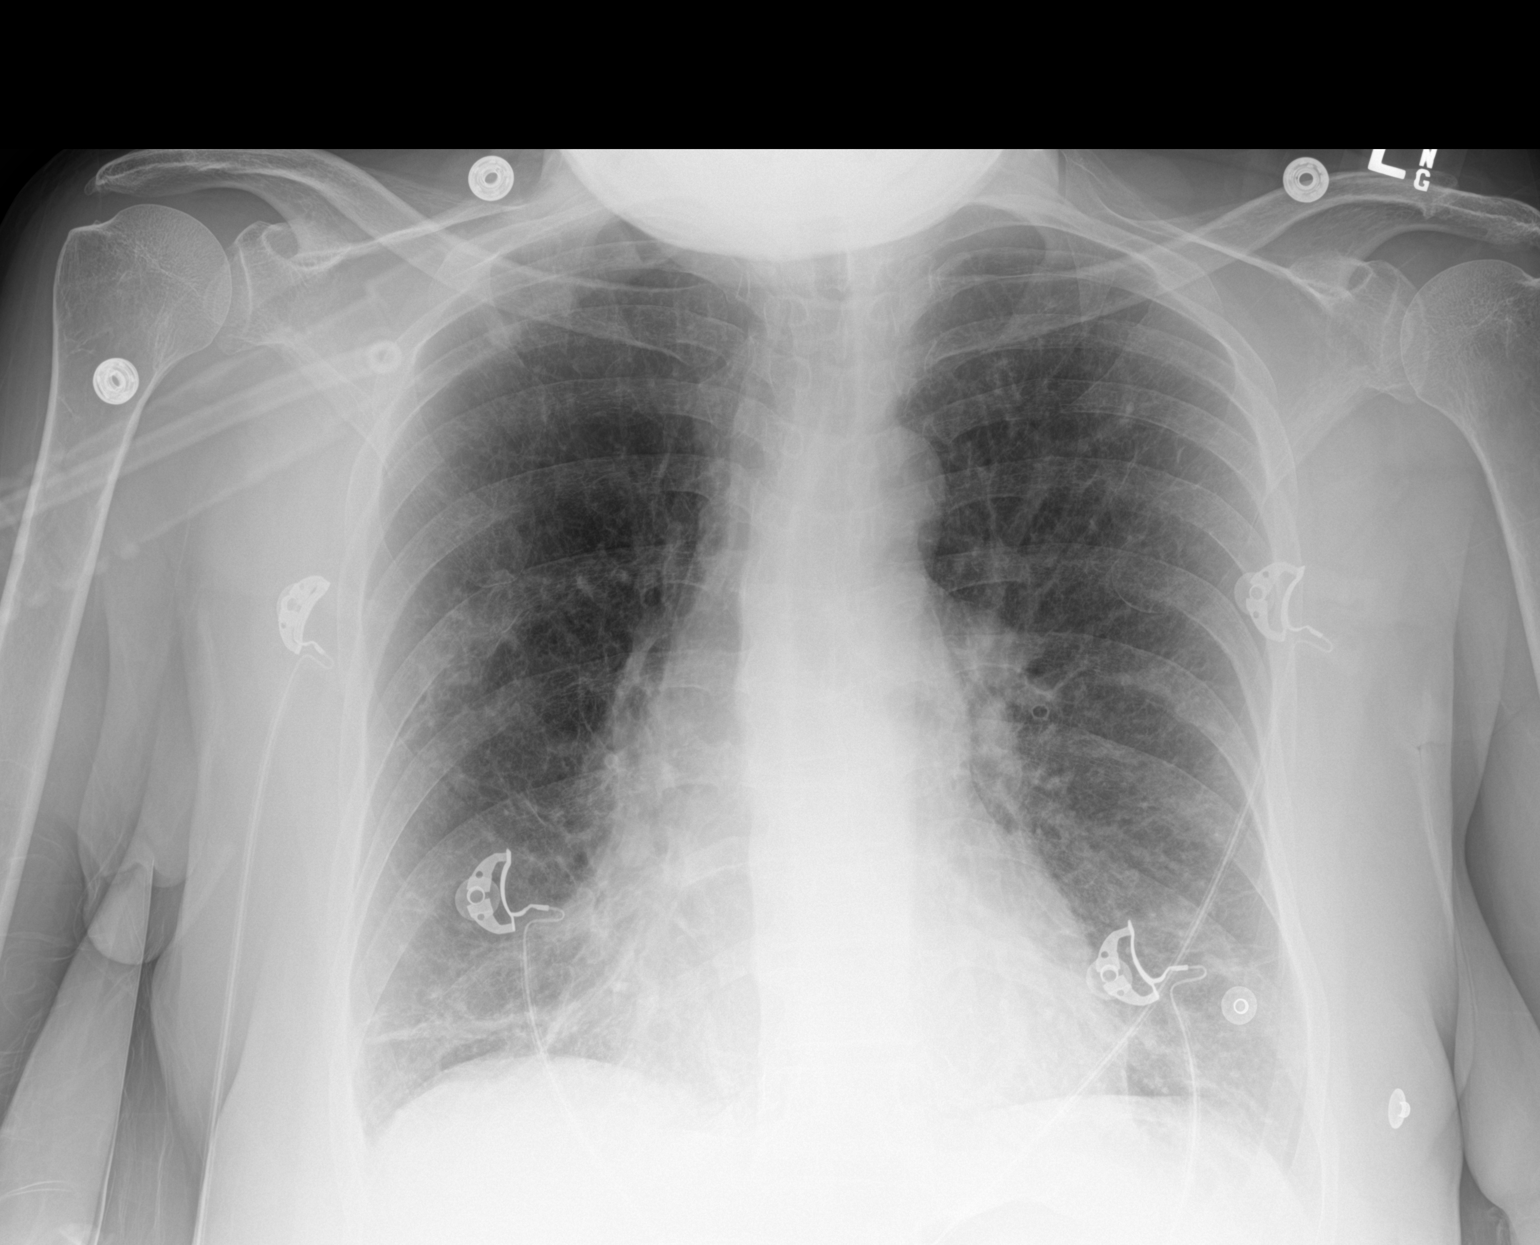

[1 of 1 positions shown; findings below may reference images not displayed]

FINDINGS: Normal heart size and mediastinal contours. Generalized interstitial
coarsening correlating with emphysema. Streaky density at the lung
bases, increased on the right from prior. Vague reticular density
over the right mid lung chest could be early pneumonia. No visible
effusion or pneumothorax.
IMPRESSION: COPD with bilateral atelectasis or bronchopneumonia. Followup PA and
lateral chest X-ray is recommended in 3-4 weeks following trial of
antibiotic therapy to ensure resolution.

## 2023-08-30 ENCOUNTER — Other Ambulatory Visit: Payer: Self-pay

## 2023-08-30 ENCOUNTER — Other Ambulatory Visit (HOSPITAL_COMMUNITY): Payer: Self-pay

## 2023-09-01 ENCOUNTER — Encounter: Payer: Self-pay | Admitting: Family Medicine

## 2023-09-01 ENCOUNTER — Other Ambulatory Visit: Payer: Self-pay

## 2023-09-01 ENCOUNTER — Ambulatory Visit: Payer: Medicaid Other | Attending: Family Medicine | Admitting: Family Medicine

## 2023-09-01 VITALS — BP 144/88 | HR 98 | Ht 62.0 in | Wt 138.8 lb

## 2023-09-01 DIAGNOSIS — Z1231 Encounter for screening mammogram for malignant neoplasm of breast: Secondary | ICD-10-CM | POA: Diagnosis not present

## 2023-09-01 DIAGNOSIS — H4043X3 Glaucoma secondary to eye inflammation, bilateral, severe stage: Secondary | ICD-10-CM

## 2023-09-01 DIAGNOSIS — I272 Pulmonary hypertension, unspecified: Secondary | ICD-10-CM

## 2023-09-01 DIAGNOSIS — J4489 Other specified chronic obstructive pulmonary disease: Secondary | ICD-10-CM

## 2023-09-01 DIAGNOSIS — I5032 Chronic diastolic (congestive) heart failure: Secondary | ICD-10-CM

## 2023-09-01 DIAGNOSIS — Z1211 Encounter for screening for malignant neoplasm of colon: Secondary | ICD-10-CM | POA: Diagnosis not present

## 2023-09-01 DIAGNOSIS — R030 Elevated blood-pressure reading, without diagnosis of hypertension: Secondary | ICD-10-CM

## 2023-09-01 DIAGNOSIS — R7303 Prediabetes: Secondary | ICD-10-CM | POA: Diagnosis not present

## 2023-09-01 LAB — POCT GLYCOSYLATED HEMOGLOBIN (HGB A1C): Hemoglobin A1C: 5.5 % (ref 4.0–5.6)

## 2023-09-01 MED ORDER — FUROSEMIDE 40 MG PO TABS
40.0000 mg | ORAL_TABLET | Freq: Every day | ORAL | 1 refills | Status: AC
  Filled 2023-09-01: qty 90, 90d supply, fill #0

## 2023-09-01 NOTE — Patient Instructions (Signed)
Managing Your Hypertension Hypertension, also called high blood pressure, is when the force of the blood pressing against the walls of the arteries is too strong. Arteries are blood vessels that carry blood from your heart throughout your body. Hypertension forces the heart to work harder to pump blood and may cause the arteries to become narrow or stiff. Understanding blood pressure readings A blood pressure reading includes a higher number over a lower number: The first, or top, number is called the systolic pressure. It is a measure of the pressure in your arteries as your heart beats. The second, or bottom number, is called the diastolic pressure. It is a measure of the pressure in your arteries as the heart relaxes. For most people, a normal blood pressure is below 120/80. Your personal target blood pressure may vary depending on your medical conditions, your age, and other factors. Blood pressure is classified into four stages. Based on your blood pressure reading, your health care provider may use the following stages to determine what type of treatment you need, if any. Systolic pressure and diastolic pressure are measured in a unit called millimeters of mercury (mmHg). Normal Systolic pressure: below 120. Diastolic pressure: below 80. Elevated Systolic pressure: 120-129. Diastolic pressure: below 80. Hypertension stage 1 Systolic pressure: 130-139. Diastolic pressure: 80-89. Hypertension stage 2 Systolic pressure: 140 or above. Diastolic pressure: 90 or above. How can this condition affect me? Managing your hypertension is very important. Over time, hypertension can damage the arteries and decrease blood flow to parts of the body, including the brain, heart, and kidneys. Having untreated or uncontrolled hypertension can lead to: A heart attack. A stroke. A weakened blood vessel (aneurysm). Heart failure. Kidney damage. Eye damage. Memory and concentration problems. Vascular  dementia. What actions can I take to manage this condition? Hypertension can be managed by making lifestyle changes and possibly by taking medicines. Your health care provider will help you make a plan to bring your blood pressure within a normal range. You may be referred for counseling on a healthy diet and physical activity. Nutrition  Eat a diet that is high in fiber and potassium, and low in salt (sodium), added sugar, and fat. An example eating plan is called the DASH diet. DASH stands for Dietary Approaches to Stop Hypertension. To eat this way: Eat plenty of fresh fruits and vegetables. Try to fill one-half of your plate at each meal with fruits and vegetables. Eat whole grains, such as whole-wheat pasta, brown rice, or whole-grain bread. Fill about one-fourth of your plate with whole grains. Eat low-fat dairy products. Avoid fatty cuts of meat, processed or cured meats, and poultry with skin. Fill about one-fourth of your plate with lean proteins such as fish, chicken without skin, beans, eggs, and tofu. Avoid pre-made and processed foods. These tend to be higher in sodium, added sugar, and fat. Reduce your daily sodium intake. Many people with hypertension should eat less than 1,500 mg of sodium a day. Lifestyle  Work with your health care provider to maintain a healthy body weight or to lose weight. Ask what an ideal weight is for you. Get at least 30 minutes of exercise that causes your heart to beat faster (aerobic exercise) most days of the week. Activities may include walking, swimming, or biking. Include exercise to strengthen your muscles (resistance exercise), such as weight lifting, as part of your weekly exercise routine. Try to do these types of exercises for 30 minutes at least 3 days a week. Do   not use any products that contain nicotine or tobacco. These products include cigarettes, chewing tobacco, and vaping devices, such as e-cigarettes. If you need help quitting, ask your  health care provider. Control any long-term (chronic) conditions you have, such as high cholesterol or diabetes. Identify your sources of stress and find ways to manage stress. This may include meditation, deep breathing, or making time for fun activities. Alcohol use Do not drink alcohol if: Your health care provider tells you not to drink. You are pregnant, may be pregnant, or are planning to become pregnant. If you drink alcohol: Limit how much you have to: 0-1 drink a day for women. 0-2 drinks a day for men. Know how much alcohol is in your drink. In the [place] one drink equals one 12 oz bottle of beer (355 mL), one 5 oz glass of wine (148 mL), or one 1 oz glass of hard liquor (44 mL). Medicines Your health care provider may prescribe medicine if lifestyle changes are not enough to get your blood pressure under control and if: Your systolic blood pressure is 130 or higher. Your diastolic blood pressure is 80 or higher. Take medicines only as told by your health care provider. Follow the directions carefully. Blood pressure medicines must be taken as told by your health care provider. The medicine does not work as well when you skip doses. Skipping doses also puts you at risk for problems. Monitoring Before you monitor your blood pressure: Do not smoke, drink caffeinated beverages, or exercise within 30 minutes before taking a measurement. Use the bathroom and empty your bladder (urinate). Sit quietly for at least 5 minutes before taking measurements. Monitor your blood pressure at home as told by your health care provider. To do this: Sit with your back straight and supported. Place your feet flat on the floor. Do not cross your legs. Support your arm on a flat surface, such as a table. Make sure your upper arm is at heart level. Each time you measure, take two or three readings one minute apart and record the results. You may also need to have your blood pressure checked regularly by  your health care provider. General information Talk with your health care provider about your diet, exercise habits, and other lifestyle factors that may be contributing to hypertension. Review all the medicines you take with your health care provider because there may be side effects or interactions. Keep all follow-up visits. Your health care provider can help you create and adjust your plan for managing your high blood pressure. Where to find more information National Heart, Lung, and Blood Institute: www.nhlbi.nih.gov American Heart Association: www.heart.org Contact a health care provider if: You think you are having a reaction to medicines you have taken. You have repeated (recurrent) headaches. You feel dizzy. You have swelling in your ankles. You have trouble with your vision. Get help right away if: You develop a severe headache or confusion. You have unusual weakness or numbness, or you feel faint. You have severe pain in your chest or abdomen. You vomit repeatedly. You have trouble breathing. These symptoms may be an emergency. Get help right away. Call 911. Do not wait to see if the symptoms will go away. Do not drive yourself to the hospital. Summary Hypertension is when the force of blood pumping through your arteries is too strong. If this condition is not controlled, it may put you at risk for serious complications. Your personal target blood pressure may vary depending on your medical conditions,   your age, and other factors. For most people, a normal blood pressure is less than 120/80. Hypertension is managed by lifestyle changes, medicines, or both. Lifestyle changes to help manage hypertension include losing weight, eating a healthy, low-sodium diet, exercising more, stopping smoking, and limiting alcohol. This information is not intended to replace advice given to you by your health care provider. Make sure you discuss any questions you have with your health care  provider. Document Revised: 06/26/2021 Document Reviewed: 06/26/2021 Elsevier Patient Education  2024 Elsevier Inc.  

## 2023-09-01 NOTE — Progress Notes (Signed)
Subjective:  Patient ID: Michele Parrish, female    DOB: 08-31-1966  Age: 57 y.o. MRN: [id number]  CC: Medical Management of Chronic Issues (Wants MM and Colonoscopy.)   HPI Michele Parrish is a 57 y.o. year old female patient of Dr. Delford Field with a history of COPD, HFpEF (EF 60 to 65% from echo 01/2023), chronic respiratory failure with hypoxia (on 5L during the day and 6L at night), depression, panuveitis, urinary incontinence, pulmonary nodules (high risk for biopsy.  Pulmonary note), Prediabetes.  Interval History: Discussed the use of AI scribe software for clinical note transcription with the patient, who gave verbal consent to proceed.   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 She is scheduled to have another surgery on the right eye. She also reports urinary incontinence, which she attributes to the Lasix she takes and was being prescribed 40 mg twice daily by PCP however she denies pedal edema and her dyspnea is at baseline.  She has no overt symptoms of fluid overload.   She is on five liters of oxygen during the day and six at night due to her COPD.  She is adherent with Symbicort, Incruse and albuterol.  Her depression is stable on Zoloft.  She also expresses a desire to get a mammogram and colonoscopy.      Past Medical History:  Diagnosis Date   Acute exacerbation of CHF (congestive heart failure) (HCC) 02/22/2020   Bronchitis    Bronchopneumonia 09/07/2021   Pericardial effusion    Pulmonary hypertension (HCC)    Suicidal ideation 06/15/2022    Past Surgical History:  Procedure Laterality Date   ABDOMINAL HYSTERECTOMY     EYE SURGERY     RIGHT HEART CATH N/A 02/23/2020   Procedure: RIGHT HEART CATH;  Surgeon: Iran Ouch, MD;  Location: [facility]VASIVE CV [facility];  Service: Cardiovascular;  Laterality: N/A;    Family History  Problem Relation Age of Onset   Prostate cancer Father    Stroke Father     Multiple sclerosis Mother     Social History   Socioeconomic History   Marital status: Single    Spouse name: Not on file   Number of children: Not on file   Years of education: Not on file   Highest education level: Not on file  Occupational History   Not on file  Tobacco Use   Smoking status: Former    Current packs/day: 0.00    Average packs/day: 1.5 packs/day for 42.0 years (63.0 ttl pk-yrs)    Types: Cigarettes    Start date: 02/17/1978    Quit date: 02/18/2020    Years since quitting: 3.5   Smokeless tobacco: Never  Vaping Use   Vaping status: Never Used  Substance and Sexual Activity   Alcohol use: Yes    Comment: 2 beers per day   Drug use: No   Sexual activity: Not on file  Other Topics Concern   Not on file  Social History Narrative   Not on file   Social Determinants of Health   Financial Resource Strain: Not on file  Food Insecurity: Food Insecurity Present (02/18/2023)   Hunger Vital Sign    Worried About Running Out of Food in the Last Year: Sometimes true    Ran Out of Food in the Last Year: Sometimes true  Transportation Needs: Unmet Transportation Needs (02/18/2023)   PRAPARE - Transportation    Lack  of Transportation (Medical): No    Lack of Transportation (Non-Medical): Yes  Physical Activity: Not on file  Stress: Not on file  Social Connections: Not on file    No Known Allergies  Outpatient Medications Prior to Visit  Medication Sig Dispense Refill   acetaminophen (TYLENOL) 500 MG tablet Take 500 mg by mouth every 6 (six) hours as needed for mild pain or moderate pain.     albuterol (VENTOLIN HFA) 108 (90 Base) MCG/ACT inhaler Inhale 2 puffs into the lungs every 4 (four) hours as needed for wheezing or shortness of breath. 18 g 5   budesonide-formoterol (SYMBICORT) 160-4.5 MCG/ACT inhaler Inhale 2 puffs into the lungs 2 (two) times daily. 10.2 g 3   dorzolamide-timolol (COSOPT) 2-0.5 % ophthalmic solution Apply to eye.     gabapentin  (NEURONTIN) 300 MG capsule Take 1 capsule (300 mg total) by mouth daily. 90 capsule 1   HUMIRA-PSORIASIS/UVEIT STARTER 80 MG/0.8ML & 40MG /0.4ML pen starter pack Inject into the skin.     mycophenolate (CELLCEPT) 500 MG tablet Take 1,500 mg by mouth in the morning and at bedtime.     nicotine (NICODERM CQ - DOSED IN MG/24 HOURS) 14 mg/24hr patch Place 1 patch (14 mg total) onto the skin daily. 28 patch 0   nicotine polacrilex (SM NICOTINE POLACRILEX) 4 MG lozenge Dissolve 1 lozenge (4 mg total) by mouth as needed for smoking cessation. 72 lozenge 0   prednisoLONE acetate (PRED FORTE) 1 % ophthalmic suspension Place 1 drop into the left eye 4 (four) times daily.     sertraline (ZOLOFT) 50 MG tablet Take 1 tablet (50 mg total) by mouth daily. 90 tablet 1   umeclidinium bromide (INCRUSE ELLIPTA) 62.5 MCG/ACT AEPB Inhale 1 puff into the lungs daily. 30 each 6   furosemide (LASIX) 40 MG tablet Take 1 tablet (40 mg total) by mouth 2 (two) times daily. 60 tablet 0   ipratropium-albuterol (DUONEB) 0.5-2.5 (3) MG/3ML SOLN Take 3 mLs by nebulization every 6 (six) hours as needed. 360 mL 2   No facility-administered medications prior to visit.     ROS Review of Systems  Constitutional:  Negative for activity change and appetite change.  HENT:  Negative for sinus pressure and sore throat.   Eyes:  Positive for visual disturbance.  Respiratory:  Negative for chest tightness, shortness of breath and wheezing.   Cardiovascular:  Negative for chest pain and palpitations.  Gastrointestinal:  Negative for abdominal distention, abdominal pain and constipation.  Genitourinary: Negative.   Musculoskeletal: Negative.   Psychiatric/Behavioral:  Negative for behavioral problems and dysphoric mood.     Objective:  BP (!) 144/88   Pulse 98   Ht 5\' 2"  (1.575 m)   Wt 138 lb 12.8 oz (63 kg)   SpO2 93%   BMI 25.39 kg/m      09/01/2023   11:21 AM 09/01/2023   10:39 AM 04/27/2023   10:02 AM  BP/Weight   Systolic BP 144 147 111  Diastolic BP 88 93 78  Wt. (Lbs)  138.8 138  BMI  25.39 kg/m2 25.24 kg/m2      Physical Exam Constitutional:      Appearance: She is well-developed.  HENT:     Nose:     Comments: On 5 L oxygen via nasal cannula Cardiovascular:     Rate and Rhythm: Normal rate.     Heart sounds: Normal heart sounds. No murmur heard. Pulmonary:     Effort: Pulmonary effort is normal.  Breath sounds: Normal breath sounds. No wheezing or rales.  Chest:     Chest wall: No tenderness.  Abdominal:     General: Bowel sounds are normal. There is no distension.     Palpations: Abdomen is soft. There is no mass.     Tenderness: There is no abdominal tenderness.  Musculoskeletal:        General: Normal range of motion.     Right lower leg: No edema.     Left lower leg: No edema.  Neurological:     Mental Status: She is alert and oriented to person, place, and time.  Psychiatric:        Mood and Affect: Mood normal.        Latest Ref Rng & Units 04/27/2023   10:40 AM 02/25/2023    4:50 AM 02/24/2023    4:41 AM  CMP  Glucose 70 - 99 mg/dL 782  97  956   BUN 6 - 24 mg/dL 9  16  12    Creatinine 0.57 - 1.00 mg/dL 2.13  0.86  5.78   Sodium 134 - 144 mmol/L 143  137  136   Potassium 3.5 - 5.2 mmol/L 3.8  4.0  3.2   Chloride 96 - 106 mmol/L 100  101  100   CO2 20 - 29 mmol/L 25  28  29    Calcium 8.7 - 10.2 mg/dL 9.2  8.6  8.1   Total Protein 6.0 - 8.5 g/dL 7.5     Total Bilirubin 0.0 - 1.2 mg/dL 0.6     Alkaline Phos 44 - 121 IU/L 91     AST 0 - 40 IU/L 32     ALT 0 - 32 IU/L 11       Lipid Panel     Component Value Date/Time   CHOL 119 02/21/2020 1605   TRIG 85 02/21/2020 1605   HDL 37 (L) 02/21/2020 1605   CHOLHDL 3.2 02/21/2020 1605   VLDL 17 02/21/2020 1605   LDLCALC 65 02/21/2020 1605    CBC    Component Value Date/Time   WBC 10.8 04/27/2023 1040   WBC 9.9 02/24/2023 0441   RBC 4.71 04/27/2023 1040   RBC 3.99 02/24/2023 0441   HGB 12.5 04/27/2023  1040   HCT 41.2 04/27/2023 1040   PLT 257 04/27/2023 1040   MCV 88 04/27/2023 1040   MCH 26.5 (L) 04/27/2023 1040   MCH 26.3 02/24/2023 0441   MCHC 30.3 (L) 04/27/2023 1040   MCHC 30.1 02/24/2023 0441   RDW 14.0 04/27/2023 1040   LYMPHSABS 3.7 (H) 04/27/2023 1040   MONOABS 1.0 02/24/2023 0441   EOSABS 0.1 04/27/2023 1040   BASOSABS 0.1 04/27/2023 1040    Lab Results  Component Value Date   HGBA1C 5.5 09/01/2023    Assessment & Plan:      COPD On 5L oxygen during the day and 6L at night. Lungs clear on examination. -Continue current inhalers  albuterol, Incruse, Symbicort).  Urinary Incontinence Likely secondary to Lasix use. No changes in symptoms. -Reduce Lasix to 40mg  once daily as no evidence of fluid overload   Pulmonary hypertension Stable  Glaucoma and Perineuritis Legally blind, has had two surgeries and requires another on the right eye.  -Currently on Humira and eyedrops  -Followed by ophthalmologist. -Continue current ophthalmology follow-up.  Depression Controlled on Sertraline. -Continue Sertraline.  Prediabetes A1C 5.5, down from 6.4. No current medication. -Continue lifestyle modifications.  Elevated Blood Pressure No previous history of  hypertension Today's reading slightly elevated, but previous reading 4 months ago was normal. -Advise low sodium diet. Recheck blood pressure at next visit.  General Health Maintenance -Order mammogram and colonoscopy. -Follow-up in 3 months.          Meds ordered this encounter  Medications   furosemide (LASIX) 40 MG tablet    Sig: Take 1 tablet (40 mg total) by mouth daily.    Dispense:  90 tablet    Refill:  1    Follow-up: Return in about 3 months (around 12/02/2023) for Chronic medical conditions.       Hoy Register, MD, FAAFP. [facility] [place], [place] [phone]   09/01/2023, 11:49 AM

## 2023-09-06 ENCOUNTER — Telehealth: Payer: Self-pay

## 2023-09-06 NOTE — Telephone Encounter (Signed)
Pt is requesting information for assisted Living

## 2023-09-06 NOTE — Telephone Encounter (Signed)
Copied from CRM [id number]. Topic: General - Other >> Sep 06, 2023  8:48 AM Marlow Baars wrote: Reason for CRM: The patient called in inquiring if her provider has put in a request for her to go to an Assisted Living Community as she needs assistance for everyday things due to being legally blind and the fact she is on oxygen. Please assist patient as soon as possible.

## 2023-09-07 ENCOUNTER — Encounter: Payer: Self-pay | Admitting: Gastroenterology

## 2023-09-07 ENCOUNTER — Telehealth: Payer: Self-pay

## 2023-09-07 NOTE — Telephone Encounter (Signed)
I called the patient back and the information is documented in another telephone encounter from today

## 2023-09-07 NOTE — Telephone Encounter (Signed)
Patient called back to f/u on the referral for the assistant living as she will be put out of her house the end of the month. Case worker will not work with her until she get the referral. Please f/u with patient

## 2023-09-07 NOTE — Telephone Encounter (Signed)
I called the patient and she explained that she is doing " fine" she has a little more sight in her right eye. However, she is facing foreclosure of her house.  She said that her [facility] case worker has been trying to convince her to go to ALF but she has been hesitant and wanted to hold onto her house as long as possible.  She said she was doing the best she could but realizes that she needs to have someone around all the time and she should not be staying alone. She is not sure of the process of getting into an ALF.    I explained that I can give her the names/ contact information for ALFs in [facility] that accept Medicaid. There are only a few but she would need to call them to check bed availability and then visit the facilities that have open female beds. She said she wants to stay in [facility] and it not interested in looking outside of the county at this time.    She said that her aide will be with her tomorrow morning and she would like me to speak with her aide. She also wants me to speak to her [facility] case worker.  She has difficulty seeing numbers in her  phone and the aide can help her with getting phone numbers, writing down information and making calls. I told the patient that if I am not available when they call tomorrow, to please leave a message and I will call right back.  I can then give the information about the ALFs to the aide and she can give me the phone number for the case worker. The patient was agreeable with that plan.    I also explained to her that when she is living at an ALF, she would need to relinquish all of her montlhly check except for about $30-60/month The patient said that is fine and she is also aware that she may not be able to keep her dog at a facility.   She explained that she only found out about a month ago, that the house was facing foreclosure. She said she had been receiving mail but was not able to read the letters. At this time she owes at  least $5500 in taxes and fees. She said there is no way she can pay that and there is no way she can keep up with the house.  She stated she was issued a letter about a court date on 10/08/2023.  She is not sure of the time frame that she would need to be out of the house.  I told her that I can refer her to [facility] [place] for possible assistance with navigating the foreclosure . There is no guarantee that they can help but we can try  She was in agreement and said if they need to send her a letter, to please ask them to mark the envelope with a highlighter and she will be able to see that it is important and she can have her aide read it for her.   Referral then sent to Legal Aid as discussed.

## 2023-09-08 ENCOUNTER — Other Ambulatory Visit: Payer: Self-pay

## 2023-09-08 ENCOUNTER — Other Ambulatory Visit: Payer: Self-pay | Admitting: Critical Care Medicine

## 2023-09-08 ENCOUNTER — Other Ambulatory Visit (HOSPITAL_COMMUNITY): Payer: Self-pay

## 2023-09-09 ENCOUNTER — Other Ambulatory Visit: Payer: Self-pay

## 2023-09-09 ENCOUNTER — Telehealth: Payer: Self-pay

## 2023-09-09 MED ORDER — NICOTINE POLACRILEX 4 MG MT LOZG
4.0000 mg | LOZENGE | OROMUCOSAL | 0 refills | Status: DC | PRN
  Filled 2023-09-09: qty 72, 30d supply, fill #0

## 2023-09-09 NOTE — Telephone Encounter (Signed)
Copied from CRM [id number]. Topic: General - Other >> Sep 09, 2023  8:45 AM Phill Myron wrote: Salome ArntErskine Squibb Per Pt. Villacis she has the number you asked for

## 2023-09-09 NOTE — Telephone Encounter (Signed)
I called the patient and she already had given me the phone number. I told her that I have already left a message for her caseworker, Joni Reining.

## 2023-09-09 NOTE — Telephone Encounter (Signed)
I spoke to the patient and she provided me with contact information for her [employer] case manager: Joni Reining: [phone].  She said her aide was not with her yesterday morning so she could not call to get the information about ALFs.  The aide will be back on Mon 09/13/2023 and they will call me then.  I called Orpha Bur and had to leave a message requesting a call back.

## 2023-09-13 NOTE — Telephone Encounter (Addendum)
I received a call from the patient because her aide was with her and she wanted to obtain the information about ALFs.  She requested that I email the list of ALFs to her aide: [email or web address]. The listing was then sent as requested.    I called Joni Reining, CM/[employer]; [phone]. again and had to leave a message requesting a call back.

## 2023-09-30 ENCOUNTER — Ambulatory Visit: Payer: Medicaid Other

## 2023-10-04 ENCOUNTER — Telehealth: Payer: Self-pay

## 2023-10-04 NOTE — Telephone Encounter (Signed)
Dr. Tomasa Rand,   Pt is on home oxygen please advise OV or direct to the hospital. Colonoscopy for [facility] has been cancelled

## 2023-10-05 ENCOUNTER — Telehealth: Payer: Self-pay

## 2023-10-05 NOTE — Telephone Encounter (Signed)
Signed FL2 an supporting documents emailed to [employer] [facility] as requested.

## 2023-10-05 NOTE — Telephone Encounter (Addendum)
Patient has called back requesting a call back from [name], RN.  Please contact patient back at phone # [phone] OR [phone]

## 2023-10-05 NOTE — Telephone Encounter (Signed)
I spoke to the patient and she said she was at DSS this morning and needs an FL2 for SNF.   The person she spoke to is referring her to Chastity Caldwell/DSS for assistance with placement.   The patient said she needs SNF, not ALF.  Chastity can be reached at : [phone].  The patient said she has been working with [facility] [place] and they are guiding her through her court hearing, 10/08/2023, and have assured her that she will not just be put on the street. She is very pleased with the assistance they have been providing.   I told her that I will contact Ms Elijah Birk to discuss the FL2.   I spoke to [facility] and she explained that she just received the referral for placement for the patient and she will be working with the patient to secure placement  She said I can email the [facility] to her along with office visit notes.  She also noted that the medication sheet can be attached to the FL2, the meds do no need to be written on the FL2. The information can be emailed to : [email or web address]

## 2023-10-05 NOTE — Telephone Encounter (Signed)
Maya please see note below and add pt to Dr. Milas Hock hospital list.

## 2023-10-05 NOTE — Telephone Encounter (Signed)
Copied from CRM [id number]. Topic: General - Other >> Oct 05, 2023  8:21 AM Franchot Heidelberg wrote: Patient called to report that she is at social services for assisted living and that she needs an [facility] for a skilled nursing facility

## 2023-10-07 ENCOUNTER — Ambulatory Visit: Payer: Medicaid Other

## 2023-10-11 ENCOUNTER — Telehealth: Payer: Self-pay | Admitting: Family Medicine

## 2023-10-11 ENCOUNTER — Other Ambulatory Visit (HOSPITAL_COMMUNITY): Payer: Self-pay

## 2023-10-11 ENCOUNTER — Other Ambulatory Visit: Payer: Self-pay | Admitting: Family Medicine

## 2023-10-11 ENCOUNTER — Other Ambulatory Visit: Payer: Self-pay

## 2023-10-11 MED ORDER — NICOTINE POLACRILEX 4 MG MT LOZG
4.0000 mg | LOZENGE | OROMUCOSAL | 0 refills | Status: DC | PRN
  Filled 2023-10-11: qty 72, 30d supply, fill #0

## 2023-10-11 NOTE — Telephone Encounter (Signed)
Call placed to [facility] for clarification of what is needed as it pertains to her O2. Unable to reach anyone message left.

## 2023-10-11 NOTE — Telephone Encounter (Signed)
Patient called stated [facility] is needing information stating patient is on oxygen. Please f/u with patient as [facility] keeps calling her.

## 2023-10-12 ENCOUNTER — Other Ambulatory Visit: Payer: Self-pay

## 2023-10-12 ENCOUNTER — Telehealth: Payer: Self-pay

## 2023-10-12 NOTE — Telephone Encounter (Signed)
Copied from CRM [id number]. Topic: General - Other >> Oct 12, 2023  8:26 AM Franchot Heidelberg wrote: Reason for CRM: Pt called requesting to speak to Erskine Squibb  [phone]

## 2023-10-13 ENCOUNTER — Telehealth: Payer: Self-pay | Admitting: Family Medicine

## 2023-10-13 ENCOUNTER — Other Ambulatory Visit (HOSPITAL_COMMUNITY): Payer: Self-pay

## 2023-10-13 MED ORDER — GABAPENTIN 300 MG PO CAPS
300.0000 mg | ORAL_CAPSULE | Freq: Every day | ORAL | 1 refills | Status: DC
  Filled 2023-10-13: qty 90, 90d supply, fill #0

## 2023-10-13 NOTE — Telephone Encounter (Signed)
Patient called stated that [facility] is still calling her requesting that someone verify that she is on oxygen. Patient is asking for the provider or nurse to call [facility] so they will stop calling her.

## 2023-10-13 NOTE — Telephone Encounter (Signed)
I spoke the patient and she said she talked to Ms Caldwell/ DSS and she said she never received the FL2.  I told her that I emailed it to her on 10/05/2023 and I will call her and check to see what happened.  The patient then said that she has been getting calls from [facility] about her O2.  She received 4 calls last week and then 4 calls already this week. She is concerned that she will be losing her O2.  She stated that they told her that they need documentation from her PCP stating her need for continued O2 use. I explained that the need for O2 is documented in the most recent note with Dr Alvis Lemmings.  I told her that I would call [facility] and call her back.  I called Ms Caldwell/ DSS and she said she never received the FL2.  I then forwarded her the email that I sent to her on 10/05/2023 and she received it while she was on the phone with me.  She said she would call the patient and let her know that they received it.  I then called [facility]: [phone] and spoke to Nat regarding the calls about O2.  She said she could not see anything in the patient's account about calls regarding O2.  I also called [facility]: [phone] and spoke to [facility].  She said she could see records of robo calls and can request that the calls be stopped.  She said there is nothing in her record about the need for documentation of her O2 use.  Madi also said that she will request that they stop the robo calls and that the patient will just need to call [facility] if she needs anything/ has any questions.  I called the patient back and informed her of my calls with Ms Elijah Birk and [facility]. She was very appreciative of the assistance  She said she is just very nervous because she is not sure where she will be staying after 10/26/2023.  She stated she did not go to court on 10/08/2023 but said that she lost her house.  She has been in contact with Legal Aid.  I received another call from the patient. She said she spoke  to Ms Elijah Birk and they discussed the options of ALF v SNF and she now thinks ALF would be more appropriate, so a new FL2 is needed for ALF.  I told her that I will need to have Dr Alvis Lemmings sign a new FL2 and I will send to Ms Elijah Birk.

## 2023-10-13 NOTE — Telephone Encounter (Signed)
Patient is requesting that Michele Parrish give her call back.

## 2023-10-13 NOTE — Telephone Encounter (Signed)
Pt is calling to ask Erskine Squibb if she can the FL-2 [employer] [facility] [employer].  Pt CB- [phone]

## 2023-10-13 NOTE — Telephone Encounter (Signed)
Office note from 09/01/2023 has been faxed to [facility].

## 2023-10-14 NOTE — Telephone Encounter (Signed)
FL2 for ALF placement emailed to [facility] Caldwell/ DSS

## 2023-10-15 ENCOUNTER — Ambulatory Visit: Payer: Medicaid Other

## 2023-10-29 ENCOUNTER — Encounter: Payer: Medicaid Other | Admitting: Gastroenterology

## 2023-11-03 ENCOUNTER — Telehealth: Payer: Self-pay

## 2023-11-03 NOTE — Telephone Encounter (Signed)
 Copied from CRM [id number]. Topic: General - Other >> Nov 02, 2023  9:52 AM Macon Large wrote: Reason for CRM: Pt reports that she was returning the call to [facility]. Pt requests call back. Cb# [phone]

## 2023-11-04 NOTE — Telephone Encounter (Signed)
 Pt called back in again, needs to speak with Alycia about scheduling appt for recertification of her oxygen machine

## 2023-11-04 NOTE — Telephone Encounter (Signed)
 Patient scheduled for walk-test on 11/11/2022. Patient is aware.

## 2023-11-08 NOTE — Telephone Encounter (Signed)
 Noted.

## 2023-11-12 ENCOUNTER — Ambulatory Visit: Payer: Medicaid Other

## 2023-11-17 ENCOUNTER — Other Ambulatory Visit: Payer: Self-pay

## 2023-11-17 ENCOUNTER — Ambulatory Visit: Payer: Medicaid Other | Attending: Family Medicine

## 2023-11-17 ENCOUNTER — Other Ambulatory Visit (HOSPITAL_COMMUNITY): Payer: Self-pay

## 2023-11-17 ENCOUNTER — Telehealth: Payer: Self-pay

## 2023-11-17 DIAGNOSIS — Z23 Encounter for immunization: Secondary | ICD-10-CM

## 2023-11-17 DIAGNOSIS — Z87891 Personal history of nicotine dependence: Secondary | ICD-10-CM

## 2023-11-17 DIAGNOSIS — Z1211 Encounter for screening for malignant neoplasm of colon: Secondary | ICD-10-CM

## 2023-11-17 DIAGNOSIS — Z111 Encounter for screening for respiratory tuberculosis: Secondary | ICD-10-CM

## 2023-11-17 DIAGNOSIS — Z9981 Dependence on supplemental oxygen: Secondary | ICD-10-CM

## 2023-11-17 MED ORDER — NICOTINE POLACRILEX 4 MG MT LOZG
4.0000 mg | LOZENGE | OROMUCOSAL | 0 refills | Status: DC | PRN
  Filled 2023-11-17 (×4): qty 72, 30d supply, fill #0

## 2023-11-17 MED ORDER — COVID-19 MRNA VAC-TRIS(PFIZER) 30 MCG/0.3ML IM SUSY
0.3000 mL | PREFILLED_SYRINGE | Freq: Once | INTRAMUSCULAR | 0 refills | Status: AC
  Filled 2023-11-17: qty 0.3, 1d supply, fill #0

## 2023-11-17 NOTE — Progress Notes (Signed)
Walk test completed patient on  Room air is 84% per protocol walk test concluded as patient is < 89% on room air.

## 2023-11-17 NOTE — Progress Notes (Signed)
PREVNAR 20 vaccine administered in right deltoid per protocols.  Information sheet given. Patient denies and pain or discomfort at injection site. Tolerated injection well no reaction.

## 2023-11-17 NOTE — Telephone Encounter (Signed)
Copied from CRM [id number]. Topic: General - Other >> Nov 16, 2023 10:55 AM Macon Large wrote: Reason for CRM: Yssa with [facility] requests the chart notes for oxygen testing. The information can be faxed to fax# [phone]. Cb# [phone] >> Nov 16, 2023  1:34 PM Phill Myron wrote: Carney Bern with [facility] Calling in regards to getting information (date and Time) on patient Carn's upcoming appointment. Please advise  And getting the notes after that appointment

## 2023-11-17 NOTE — Progress Notes (Signed)
Flu vaccine administered in LEFT deltoid per protocols.  Information sheet given. Patient denies and pain or discomfort at injection site. Tolerated injection well no reaction.

## 2023-11-17 NOTE — Progress Notes (Signed)
PPD Placement note Michele Parrish, 58 y.o. female is here today for placement of PPD test Reason for PPD test:assist living placement Pt taken PPD test before: no Has the patient ever received the BCG vaccine?: no Has the patient been in recent contact with anyone known or suspected of having active TB disease?: no     Patient's Country of origin?: [name] O: Alert and oriented in NAD. P:  PPD placed on 11/17/2023.  Patient advised to return for reading within 48-72 hours.

## 2023-11-17 NOTE — Addendum Note (Signed)
Addended by: [name] F on: 11/17/2023 12:17 PM   Modules accepted: Orders

## 2023-11-17 NOTE — Telephone Encounter (Signed)
Left VM for patient to return call regarding an opening at [facility] 11/23/23.

## 2023-11-19 ENCOUNTER — Ambulatory Visit: Payer: Medicaid Other | Attending: Family Medicine

## 2023-11-19 ENCOUNTER — Telehealth: Payer: Self-pay

## 2023-11-19 LAB — TB SKIN TEST
Induration: 0 mm
TB Skin Test: NEGATIVE

## 2023-11-19 NOTE — Progress Notes (Signed)
PPD Reading Note PPD read and results entered in EpicCare. Result: 0 mm induration. Interpretation: NEGATIVE Allergic reaction: no

## 2023-11-19 NOTE — Telephone Encounter (Signed)
Copied from CRM [id number]. Topic: Appointments - Scheduling Inquiry for Clinic >> Nov 19, 2023 11:00 AM Payton Doughty wrote: Reason for CRM: [name] w/ [employer] would like Cassandra to call her please.  She said she knows what it is concerning.  [phone]

## 2023-11-19 NOTE — Telephone Encounter (Signed)
Spoke with Hydia at [employer]. Hydia said that patient is slated to move into [facility] on Monday. She also conveyed the patient was having much difficulty moving throughout the facility during her visit , which caused concerns about patient's safety. Hydia as requested an order for a wheelchair and a hospital bed. Hydia said the patient voiced she would not be able to get out of the beds they have in the facility. Their beds are all regular twin beds and low to the floor, which will make it difficult for the patient to get out of bed. Hydia said  the hospital bed would have to ordered through patient insurance as they have none within their facility. Hydia said the the visit went well and the patient became tearful about facing change, but seem glad that she was coming there to live. Advised that I would send her request the patient's PCP. Medication list faxed to [facility] as requested.

## 2023-11-22 MED ORDER — MISC. DEVICES MISC: 0 refills | Status: DC

## 2023-11-22 NOTE — Telephone Encounter (Signed)
duplicate

## 2023-11-22 NOTE — Addendum Note (Signed)
Addended by: Hoy Register on: 11/22/2023 04:59 PM   Modules accepted: Orders

## 2023-11-22 NOTE — Telephone Encounter (Signed)
Order for O2 to be set up at [facility] efaxed to [employer].    I called [facility] to speak to [facility] but she was not at the facility. I left a message with [place], requesting Hydia call me back. I need to inform her of the O2 order and schedule the patient for an appointment with Dr Alvis Lemmings to address the need for the wheelchair and hospital bed.

## 2023-11-22 NOTE — Telephone Encounter (Signed)
Order for oxygen has been placed.

## 2023-11-22 NOTE — Telephone Encounter (Signed)
Humira is being prescribed ophthalmology and the dose is 0.4 mL liters every 14 days. Does she need an office visit to place orders for hospital bed, wheelchair and oxygen or I can just place orders without a visit?  Thanks.

## 2023-11-22 NOTE — Telephone Encounter (Signed)
Call received from [facility], [facility].  She said that they patient is supposed to arrive there today at 1030.   In addition to the order for the hospital bed, she is requesting an order for patient to continue O2 noting in the prescription that the patient is now a resident at a facility.   She is also requesting clarification of the order for Humira - dose and frequency.  Thanks

## 2023-11-22 NOTE — Telephone Encounter (Signed)
Noted. I mentioned to Hydia that patient may need a visit with PCP. I will let Hydia know and schedule patient. Thank you

## 2023-11-23 NOTE — Telephone Encounter (Signed)
[name], RN and I spoke to [name], Administrator/ [employer]. She said that the patient has moved into the facility and she ( Michele Parrish) went to patient's home to pick up the O2 concentrator so they are all set with the O2. She said she received a call from [facility] this morning and she informed them that the O2 has already been moved to the facility.  We then reviewed the medication list. Michele Parrish said that she is aware that the Humira was ordered by the ophthalmologist and is administered every 14 days and she said that the patient administered it to herself yesterday. She went on to say that she has contacted the ophthalmologist for clarification of the orders for the Dorzolamide HCL-Timolol.   Lasix is ordered as 40 mg by mouth daily and the patient told them she takes is every other day, not daily.  The patient also told the staff that she is very upset with the low sodium, heart healthy diet and she needs her salt.   The patient has been scheduled with Corene Cornea, PA 12/08/2023@ 1430 for evaluation for a hospital bed and wheelchair. Michele Parrish was given the appointment information and confirmed that the facility will provide transportation for the patient to [facility].

## 2023-11-25 ENCOUNTER — Other Ambulatory Visit: Payer: Self-pay

## 2023-11-25 DIAGNOSIS — Z1211 Encounter for screening for malignant neoplasm of colon: Secondary | ICD-10-CM

## 2023-11-25 DIAGNOSIS — Z9981 Dependence on supplemental oxygen: Secondary | ICD-10-CM

## 2023-11-25 MED ORDER — PLENVU 140 G PO SOLR: 1.0000 | Freq: Once | ORAL | 0 refills | Status: AC

## 2023-11-25 NOTE — Telephone Encounter (Signed)
Lurena Joiner with [facility] called back and said that they would be able to get patient to her hospital procedure with Dr. Tomasa Rand on Monday February 10th, to arrive at 9:15 am for 10:45 am case. They understands someone must stay with her to drive her home. Lurena Joiner asked that instructions and script for prep (Plenvu) be faxed to [phone].  Patient scheduled.  Case# [id number]. Instructions faxed.

## 2023-11-25 NOTE — Addendum Note (Signed)
Addended by: Cooper Render on: 11/25/2023 10:49 AM   Modules accepted: Orders

## 2023-11-25 NOTE — Telephone Encounter (Signed)
Called patient and offered her Feb 10th.  She lives in assisted living and will need to speak with someone there and call us back to confirm

## 2023-11-29 ENCOUNTER — Encounter (HOSPITAL_COMMUNITY): Payer: Self-pay | Admitting: Gastroenterology

## 2023-11-29 NOTE — Progress Notes (Signed)
reop instructions for: Michele Parrish    Date of Birth:  Mar 17, 1966                Date of Procedure:  12/06/2023 Procedure:     Colonoscopy Surgeon: Dr. Tomasa Rand Facility contact:   [facility] Phone:  [phone]           RN contact name/phone#:            and Fax #: [phone]   Transportation contact phone#: Facility transportation  Time to arrive at [facility]: 0915   Report to: Admitting - Go through main entrance of hospital and tell front desk you are having a procedure done, and they will show how to get to admitting dept.   Do not eat solid food past midnight the night before your procedure.(To include any tube feedings-must be discontinued)  May have the following until 0630 am day of procedure  CLEAR LIQUID DIET Water Black Coffee (sugar ok, NO MILK/CREAM OR CREAMERS)  Tea (sugar ok, NO MILK/CREAM OR CREAMERS) regular and decaf                             Plain Jell-O (NO RED)                                           Fruit ices (not with fruit pulp, NO RED)                                     Popsicles (NO RED)                                                                  Juice: apple, WHITE grape, WHITE cranberry Sports drinks like Gatorade (NO RED)   Take these morning medications only with sips of water.(or give through gastrostomy or feeding tube):   Take- Gabapentin, zoloft, cellcept Hold- Lasix am of procedure  Note: No Insulin or Diabetic meds should be given or taken the morning of the procedure!   Please send day of procedure: current med list and meds last taken that day, confirm nothing by mouth status from what time, Patient Demographic info( to include DNR status, problem list, allergies)   Bring Insurance card and ID, can shower & use deodorant but nothing else on skin. Leave all jewelry and other valuables at place where living (anything with metal- remove) Any questions prior to procedure call pre surg  nurse Lyla Son [phone]  Any questions DAY OF procedure, call ENDO [phone]   Sent from :Lyla Son, RN-[facility] Presurgical Testing   Phone:[phone] Fax:[phone]

## 2023-12-06 ENCOUNTER — Ambulatory Visit (HOSPITAL_BASED_OUTPATIENT_CLINIC_OR_DEPARTMENT_OTHER): Payer: Medicaid Other | Admitting: Certified Registered"

## 2023-12-06 ENCOUNTER — Encounter (HOSPITAL_COMMUNITY)
Admission: RE | Disposition: A | Discharge status: Skilled Nursing Facility | Payer: Self-pay | Source: Home / Self Care | Attending: Gastroenterology

## 2023-12-06 ENCOUNTER — Encounter (HOSPITAL_COMMUNITY): Payer: Self-pay | Admitting: Gastroenterology

## 2023-12-06 ENCOUNTER — Other Ambulatory Visit: Payer: Self-pay

## 2023-12-06 ENCOUNTER — Ambulatory Visit (HOSPITAL_COMMUNITY): Payer: Medicaid Other | Admitting: Certified Registered"

## 2023-12-06 ENCOUNTER — Ambulatory Visit (HOSPITAL_COMMUNITY)
Admission: RE | Admit: 2023-12-06 | Discharge: 2023-12-06 | Disposition: A | Discharge status: Skilled Nursing Facility | Payer: Medicaid Other | Attending: Gastroenterology | Admitting: Gastroenterology

## 2023-12-06 ENCOUNTER — Ambulatory Visit: Payer: Medicaid Other | Admitting: Family Medicine

## 2023-12-06 ENCOUNTER — Telehealth: Payer: Self-pay | Admitting: *Deleted

## 2023-12-06 DIAGNOSIS — D499 Neoplasm of unspecified behavior of unspecified site: Secondary | ICD-10-CM

## 2023-12-06 DIAGNOSIS — A630 Anogenital (venereal) warts: Secondary | ICD-10-CM | POA: Diagnosis not present

## 2023-12-06 DIAGNOSIS — Z87891 Personal history of nicotine dependence: Secondary | ICD-10-CM | POA: Insufficient documentation

## 2023-12-06 DIAGNOSIS — Z9981 Dependence on supplemental oxygen: Secondary | ICD-10-CM | POA: Diagnosis not present

## 2023-12-06 DIAGNOSIS — K5730 Diverticulosis of large intestine without perforation or abscess without bleeding: Secondary | ICD-10-CM | POA: Insufficient documentation

## 2023-12-06 DIAGNOSIS — Z1211 Encounter for screening for malignant neoplasm of colon: Secondary | ICD-10-CM | POA: Diagnosis present

## 2023-12-06 DIAGNOSIS — K6289 Other specified diseases of anus and rectum: Secondary | ICD-10-CM | POA: Insufficient documentation

## 2023-12-06 DIAGNOSIS — J449 Chronic obstructive pulmonary disease, unspecified: Secondary | ICD-10-CM | POA: Diagnosis not present

## 2023-12-06 DIAGNOSIS — Z7951 Long term (current) use of inhaled steroids: Secondary | ICD-10-CM | POA: Insufficient documentation

## 2023-12-06 DIAGNOSIS — D123 Benign neoplasm of transverse colon: Secondary | ICD-10-CM | POA: Diagnosis not present

## 2023-12-06 HISTORY — PX: POLYPECTOMY: SHX5525

## 2023-12-06 HISTORY — PX: COLONOSCOPY WITH PROPOFOL: SHX5780

## 2023-12-06 SURGERY — COLONOSCOPY WITH PROPOFOL
Anesthesia: Monitor Anesthesia Care

## 2023-12-06 MED ORDER — SODIUM CHLORIDE 0.9 % IV SOLN
INTRAVENOUS | Status: DC | PRN
Start: 2023-12-06 — End: 2023-12-06

## 2023-12-06 MED ORDER — PROPOFOL 10 MG/ML IV BOLUS
INTRAVENOUS | Status: DC | PRN
  Administered 2023-12-06: 30 mg via INTRAVENOUS
  Administered 2023-12-06: 40 mg via INTRAVENOUS
  Administered 2023-12-06: 20 mg via INTRAVENOUS

## 2023-12-06 MED ORDER — PHENYLEPHRINE 80 MCG/ML (10ML) SYRINGE FOR IV PUSH (FOR BLOOD PRESSURE SUPPORT)
PREFILLED_SYRINGE | INTRAVENOUS | Status: DC | PRN
  Administered 2023-12-06: 80 ug via INTRAVENOUS

## 2023-12-06 MED ORDER — PROPOFOL 500 MG/50ML IV EMUL
INTRAVENOUS | Status: DC | PRN
  Administered 2023-12-06: 135 ug/kg/min via INTRAVENOUS

## 2023-12-06 MED ORDER — SODIUM CHLORIDE 0.9% FLUSH: 3.0000 mL | INTRAVENOUS | Status: DC | PRN

## 2023-12-06 MED ORDER — SODIUM CHLORIDE 0.9% FLUSH: 3.0000 mL | Freq: Two times a day (BID) | INTRAVENOUS | Status: DC

## 2023-12-06 SURGICAL SUPPLY — 20 items

## 2023-12-06 NOTE — Transfer of Care (Signed)
 Immediate Anesthesia Transfer of Care Note  Patient: Michele Parrish  Procedure(s) Performed: COLONOSCOPY WITH PROPOFOL  POLYPECTOMY  Patient Location: PACU  Anesthesia Type:MAC  Level of Consciousness: drowsy  Airway & Oxygen Therapy: Patient Spontanous Breathing and Patient connected to face mask oxygen  Post-op Assessment: Report given to RN, Post -op Vital signs reviewed and stable, and Patient moving all extremities X 4  Post vital signs: Reviewed and stable  Last Vitals:  Vitals Value Taken Time  BP 87/56   Temp    Pulse 78   Resp 12   SpO2 100     Last Pain:  Vitals:   12/06/23 0928  TempSrc: Tympanic  PainSc: 0-No pain         Complications: No notable events documented.

## 2023-12-06 NOTE — Telephone Encounter (Signed)
 Electronic referral has been placed as "urgent" with [facility] [employer] [facility] (phone [phone]).

## 2023-12-06 NOTE — Anesthesia Preprocedure Evaluation (Signed)
 Anesthesia Evaluation  Patient identified by MRN, date of birth, ID band Patient awake    Reviewed: Allergy & Precautions, H&P , NPO status , Patient's Chart, lab work & pertinent test results  Airway Mallampati: II   Neck ROM: full    Dental   Pulmonary COPD, former smoker   breath sounds clear to auscultation       Cardiovascular +CHF   Rhythm:regular Rate:Normal     Neuro/Psych  PSYCHIATRIC DISORDERS  Depression       GI/Hepatic   Endo/Other    Renal/GU      Musculoskeletal   Abdominal   Peds  Hematology   Anesthesia Other Findings   Reproductive/Obstetrics                              Anesthesia Physical Anesthesia Plan  ASA: 2  Anesthesia Plan: MAC   Post-op Pain Management:    Induction: Intravenous  PONV Risk Score and Plan: 2 and Propofol  infusion and Treatment may vary due to age or medical condition  Airway Management Planned: Nasal Cannula  Additional Equipment:   Intra-op Plan:   Post-operative Plan:   Informed Consent: I have reviewed the patients History and Physical, chart, labs and discussed the procedure including the risks, benefits and alternatives for the proposed anesthesia with the patient or authorized representative who has indicated his/her understanding and acceptance.     Dental advisory given  Plan Discussed with: CRNA, Anesthesiologist and Surgeon  Anesthesia Plan Comments:          Anesthesia Quick Evaluation

## 2023-12-06 NOTE — Telephone Encounter (Signed)
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  Thanks

## 2023-12-06 NOTE — Op Note (Signed)
 [facility] Patient Name: Michele Parrish Procedure Date: 12/06/2023 MRN: [id number] Attending MD: Jaye Mettle. Cherryl Corona , MD, [id number] Date of Birth: 12-Aug-1966 CSN: [id number] Age: 58 Admit Type: Outpatient Procedure:                Colonoscopy Indications:              Screening for colorectal malignant neoplasm, This                            is the patient's first colonoscopy Providers:                Geralyn Knee E. Cherryl Corona, MD, Ila Malay, RN, Tyrus Gallus, Technician Referring MD:              Medicines:                Monitored Anesthesia Care Complications:            No immediate complications. Estimated Blood Loss:     Estimated blood loss was minimal. Procedure:                Pre-Anesthesia Assessment:                           - Prior to the procedure, a History and Physical                            was performed, and patient medications and                            allergies were reviewed. The patient's tolerance of                            previous anesthesia was also reviewed. The risks                            and benefits of the procedure and the sedation                            options and risks were discussed with the patient.                            All questions were answered, and informed consent                            was obtained. Prior Anticoagulants: The patient has                            taken no anticoagulant or antiplatelet agents. ASA                            Grade Assessment: III - A patient with severe  systemic disease. After reviewing the risks and                            benefits, the patient was deemed in satisfactory                            condition to undergo the procedure.                           After obtaining informed consent, the colonoscope                            was passed under direct vision. Throughout the                             procedure, the patient's blood pressure, pulse, and                            oxygen saturations were monitored continuously. The                            PCF-HQ190L ([id number] Olympus colonoscope was                            introduced through the anus and advanced to the the                            cecum, identified by appendiceal orifice and                            ileocecal valve. The colonoscopy was performed                            without difficulty. The patient tolerated the                            procedure well. The quality of the bowel                            preparation was good. The ileocecal valve,                            appendiceal orifice, and rectum were photographed. Scope In: 9:46:44 AM Scope Out: 10:07:10 AM Scope Withdrawal Time: 0 hours 15 minutes 23 seconds  Total Procedure Duration: 0 hours 20 minutes 26 seconds  Findings:      The perianal exam findings include a large verruccous, lobular mass,       mostly involving the vulva/perineum, but which did appear to extend to       the anterior anal canal.      The digital rectal exam was normal. Pertinent negatives include normal       sphincter tone and no palpable rectal lesions.      A 10 mm polyp was found in the distal transverse colon. The polyp was       sessile. The  polyp was removed with a cold snare. Resection and       retrieval were complete. Estimated blood loss was minimal.      Multiple large-mouthed, medium-mouthed and small-mouthed diverticula       were found in the sigmoid colon, descending colon, transverse colon and       ascending colon. There was no evidence of diverticular bleeding.      The exam was otherwise normal throughout the examined colon.      A single tongue of granular anoderm extended from the vulvar       mass/perineum to the dentate line. It did not involve the rectum.      No additional abnormalities were found on retroflexion. Impression:                - Large mass involving the vulva, with some                            extension of abnormal anoderm into the anal canal,                            stopping at the dentate line. Findings concerning                            for cancer vs condylomata.                           - One 10 mm polyp in the distal transverse colon,                            removed with a cold snare. Resected and retrieved.                           - Moderate diverticulosis in the sigmoid colon, in                            the descending colon, in the transverse colon and                            in the ascending colon. There was no evidence of                            diverticular bleeding. Moderate Sedation:      Not Applicable - Patient had care per Anesthesia. Recommendation:           - Patient has a contact number available for                            emergencies. The signs and symptoms of potential                            delayed complications were discussed with the                            patient. Return to normal activities tomorrow.  Written discharge instructions were provided to the                            patient.                           - Resume previous diet.                           - Continue present medications.                           - Await pathology results.                           - Repeat colonoscopy (date not yet determined) for                            surveillance based on pathology results.                           - Refer patient to gynecology for further                            evaluation and management of vulvar mass. Procedure Code(s):        --- Professional ---                           [phone], Colonoscopy, flexible; with removal of                            tumor(s), polyp(s), or other lesion(s) by snare                            technique Diagnosis Code(s):        --- Professional ---                            Z12.11, Encounter for screening for malignant                            neoplasm of colon                           A63.0, Anogenital (venereal) warts                           D12.3, Benign neoplasm of transverse colon (hepatic                            flexure or splenic flexure)                           K62.89, Other specified diseases of anus and rectum                           K57.30, Diverticulosis of large intestine without  perforation or abscess without bleeding CPT copyright 2022 American Medical Association. All rights reserved. The codes documented in this report are preliminary and upon coder review may  be revised to meet current compliance requirements. Michele Kerlin E. Cherryl Corona, MD 12/06/2023 10:30:38 AM This report has been signed electronically. Number of Addenda: 0

## 2023-12-06 NOTE — Anesthesia Procedure Notes (Signed)
 Procedure Name: MAC Date/Time: 12/06/2023 9:39 AM  Performed by: Ulanda Gambles, CRNAPre-anesthesia Checklist: Patient identified, Suction available, Emergency Drugs available and Patient being monitored Oxygen Delivery Method: Simple face mask

## 2023-12-06 NOTE — H&P (Signed)
 [facility] History and Physical   Primary Care Physician:  Joaquin Mulberry, MD   Reason for Procedure:   Colon cancer screening  Plan:    Screening colonoscopy     HPI: Michele Parrish is a 58 y.o. female undergoing initial average risk screening colonoscopy.  She has no family history of colon cancer and no chronic GI symptoms.  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   Past Medical History:  Diagnosis Date   Acute exacerbation of CHF (congestive heart failure) (HCC) 02/22/2020   Bronchitis    Bronchopneumonia 09/07/2021   Pericardial effusion    Pulmonary hypertension (HCC)    Suicidal ideation 06/15/2022    Past Surgical History:  Procedure Laterality Date   ABDOMINAL HYSTERECTOMY     EYE SURGERY     RIGHT HEART CATH N/A 02/23/2020   Procedure: RIGHT HEART CATH;  Surgeon: Wenona Hamilton, MD;  Location: [facility];  Service: Cardiovascular;  Laterality: N/A;    Prior to Admission medications   Medication Sig Start Date End Date Taking? Authorizing Provider  acetaminophen  (TYLENOL ) 500 MG tablet Take 500 mg by mouth every 6 (six) hours as needed for mild pain or moderate pain.    [provider]  albuterol  (VENTOLIN  HFA) 108 (90 Base) MCG/ACT inhaler Inhale 2 puffs into the lungs every 4 (four) hours as needed for wheezing or shortness of breath. 04/27/23 09/06/23  Vernell Goldsmith, MD  budesonide -formoterol  (SYMBICORT ) 160-4.5 MCG/ACT inhaler Inhale 2 puffs into the lungs 2 (two) times daily. 06/22/23   Vernell Goldsmith, MD  dorzolamide-timolol  (COSOPT) 2-0.5 % ophthalmic solution Apply to eye. 03/30/23 03/29/24  [provider]  furosemide  (LASIX ) 40 MG tablet Take 1 tablet (40 mg total) by mouth daily. 09/01/23 10/01/23  Newlin, Enobong, MD  gabapentin  (NEURONTIN ) 300 MG capsule Take 1 capsule (300 mg total) by mouth daily. 10/13/23   Newlin, Enobong, MD   HUMIRA-PSORIASIS/UVEIT STARTER 80 MG/0.8ML & 40MG /0.4ML pen starter pack Inject into the skin. 04/13/23   [provider]  ipratropium-albuterol  (DUONEB) 0.5-2.5 (3) MG/3ML SOLN Take 3 mLs by nebulization every 6 (six) hours as needed. 04/27/23 05/28/23  Vernell Goldsmith, MD  Misc. Devices MISC Set up oxygen at new location, 5 L of oxygen via nasal cannula.  Diagnosis-chronic hypoxic respiratory failure. 11/22/23   Newlin, Enobong, MD  mycophenolate  (CELLCEPT ) 500 MG tablet Take 1,500 mg by mouth in the morning and at bedtime.    [provider]  nicotine  (NICODERM CQ  - DOSED IN MG/24 HOURS) 14 mg/24hr patch Place 1 patch (14 mg total) onto the skin daily. 02/25/23   Leona Rake, MD  nicotine  polacrilex (SM NICOTINE  POLACRILEX) 4 MG lozenge Dissolve 1 lozenge (4 mg total) by mouth as needed for smoking cessation. 11/17/23   Newlin, Enobong, MD  prednisoLONE  acetate (PRED FORTE ) 1 % ophthalmic suspension Place 1 drop into the left eye 4 (four) times daily.    [provider]  sertraline  (ZOLOFT ) 50 MG tablet Take 1 tablet (50 mg total) by mouth daily. 06/16/23   Vernell Goldsmith, MD  umeclidinium bromide  (INCRUSE ELLIPTA ) 62.5 MCG/ACT AEPB Inhale 1 puff into the lungs daily. 06/22/23   Vernell Goldsmith, MD    Current Facility-Administered Medications  Medication Dose Route Frequency Provider Last Rate Last Admin   sodium chloride  flush (NS) 0.9 % injection 3-10 mL  3-10 mL Intravenous Q12H Elois Hair, MD  sodium chloride  flush (NS) 0.9 % injection 3-10 mL  3-10 mL Intravenous PRN Elois Hair, MD        Allergies as of 11/25/2023   (No Known Allergies)    Family History  Problem Relation Age of Onset   Prostate cancer Father    Stroke Father    Multiple sclerosis Mother     Social History   Socioeconomic History   Marital status: Single    Spouse name: Not on file   Number of children: Not on file   Years of education: Not on file    Highest education level: Not on file  Occupational History   Not on file  Tobacco Use   Smoking status: Former    Current packs/day: 0.00    Average packs/day: 1.5 packs/day for 42.0 years (63.0 ttl pk-yrs)    Types: Cigarettes    Start date: 02/17/1978    Quit date: 02/18/2020    Years since quitting: 3.8   Smokeless tobacco: Never  Vaping Use   Vaping status: Never Used  Substance and Sexual Activity   Alcohol  use: Yes    Comment: 2 beers per day   Drug use: No   Sexual activity: Not on file  Other Topics Concern   Not on file  Social History Narrative   Not on file   Social Drivers of Health   Financial Resource Strain: Not on file  Food Insecurity: Food Insecurity Present (02/18/2023)   Hunger Vital Sign    Worried About Running Out of Food in the Last Year: Sometimes true    Ran Out of Food in the Last Year: Sometimes true  Transportation Needs: Unmet Transportation Needs (02/18/2023)   PRAPARE - Administrator, Civil Service (Medical): No    Lack of Transportation (Non-Medical): Yes  Physical Activity: Not on file  Stress: Not on file  Social Connections: Not on file  Intimate Partner Violence: Not At Risk (02/18/2023)   Humiliation, Afraid, Rape, and Kick questionnaire    Fear of Current or Ex-Partner: No    Emotionally Abused: No    Physically Abused: No    Sexually Abused: No    Review of Systems:  All other review of systems negative except as mentioned in the HPI.  Physical Exam: Vital signs BP (!) 147/92   Pulse 71   Temp 98.3 F (36.8 C) (Tympanic)   Resp 18   Ht 5\' 2"  (1.575 m)   Wt 63.5 kg   SpO2 95%   BMI 25.61 kg/m   General:   Alert,  Well-developed, well-nourished, pleasant and cooperative in NAD Airway:  Mallampati 2 Lungs:  Clear throughout to auscultation.   Heart:  Regular rate and rhythm; no murmurs, clicks, rubs,  or gallops. Abdomen:  Soft, nontender and nondistended. Normal bowel sounds.   Neuro/Psych:  Normal mood  and affect. A and O x 3   Kerensa Nicklas E. Cherryl Corona, MD [facility]

## 2023-12-06 NOTE — Discharge Instructions (Signed)

## 2023-12-07 LAB — SURGICAL PATHOLOGY

## 2023-12-08 ENCOUNTER — Ambulatory Visit: Payer: Medicaid Other | Attending: Physician Assistant | Admitting: Physician Assistant

## 2023-12-08 ENCOUNTER — Encounter: Payer: Self-pay | Admitting: Gastroenterology

## 2023-12-08 ENCOUNTER — Other Ambulatory Visit: Payer: Self-pay | Admitting: Physician Assistant

## 2023-12-08 VITALS — BP 133/90 | HR 86 | Wt 144.2 lb

## 2023-12-08 DIAGNOSIS — Z9981 Dependence on supplemental oxygen: Secondary | ICD-10-CM | POA: Diagnosis not present

## 2023-12-08 DIAGNOSIS — R030 Elevated blood-pressure reading, without diagnosis of hypertension: Secondary | ICD-10-CM

## 2023-12-08 DIAGNOSIS — F321 Major depressive disorder, single episode, moderate: Secondary | ICD-10-CM

## 2023-12-08 DIAGNOSIS — H5440 Blindness, one eye, unspecified eye: Secondary | ICD-10-CM

## 2023-12-08 DIAGNOSIS — D179 Benign lipomatous neoplasm, unspecified: Secondary | ICD-10-CM

## 2023-12-08 MED ORDER — SERTRALINE HCL 50 MG PO TABS: 50.0000 mg | ORAL_TABLET | Freq: Every day | ORAL | 1 refills | Status: DC

## 2023-12-08 NOTE — Patient Instructions (Signed)
Check blood pressures daily and record and bring to next visit.

## 2023-12-08 NOTE — Progress Notes (Signed)
Patient ID: Michele Parrish, female   DOB: June 17, 1966, 58 y.o.   MRN: [id number]   Gwenn Teodoro, is a 58 y.o. female  MWN:[id number]  QIH:[id number]  DOB - 1966/10/13  Chief Complaint  Patient presents with   Medical Management of Chronic Issues    Med       Subjective:   Michele Parrish is a 58 y.o. female here today and has been placed in assisted living.  She does not want to take lasix daily.  She also wants to be on a regular diet and not a heart healthy diet.  Last few recorded BP have been elevated.  She denies worsening SOB although already on 5L home O2.  She needs a hospital bed ordered bc the one she has in the facility is actually someone else's.  She denies edema  Also needs a shower chair.   Doing well on zoloft and needs RF.    No problems updated.  ALLERGIES: No Known Allergies  PAST MEDICAL HISTORY: Past Medical History:  Diagnosis Date   Acute exacerbation of CHF (congestive heart failure) (HCC) 02/22/2020   Bronchitis    Bronchopneumonia 09/07/2021   Pericardial effusion    Pulmonary hypertension (HCC)    Suicidal ideation 06/15/2022    MEDICATIONS AT HOME: Prior to Admission medications   Medication Sig Start Date End Date Taking? Authorizing Provider  acetaminophen (TYLENOL) 500 MG tablet Take 500 mg by mouth every 6 (six) hours as needed for mild pain or moderate pain.   Yes [provider]  budesonide-formoterol (SYMBICORT) 160-4.5 MCG/ACT inhaler Inhale 2 puffs into the lungs 2 (two) times daily. 06/22/23  Yes Storm Frisk, MD  dorzolamide-timolol (COSOPT) 2-0.5 % ophthalmic solution Apply to eye. 03/30/23 03/29/24 Yes [provider]  gabapentin (NEURONTIN) 300 MG capsule Take 1 capsule (300 mg total) by mouth daily. 10/13/23  Yes Newlin, Odette Horns, MD  HUMIRA-PSORIASIS/UVEIT STARTER 80 MG/0.8ML & 40MG /0.4ML pen starter pack Inject into the skin. 04/13/23  Yes [provider]  Misc. Devices MISC Set up oxygen at new  location, 5 L of oxygen via nasal cannula.  Diagnosis-chronic hypoxic respiratory failure. 11/22/23  Yes Hoy Register, MD  mycophenolate (CELLCEPT) 500 MG tablet Take 1,500 mg by mouth in the morning and at bedtime.   Yes [provider]  nicotine (NICODERM CQ - DOSED IN MG/24 HOURS) 14 mg/24hr patch Place 1 patch (14 mg total) onto the skin daily. 02/25/23  Yes Burnadette Pop, MD  nicotine polacrilex (SM NICOTINE POLACRILEX) 4 MG lozenge Dissolve 1 lozenge (4 mg total) by mouth as needed for smoking cessation. 11/17/23  Yes Hoy Register, MD  prednisoLONE acetate (PRED FORTE) 1 % ophthalmic suspension Place 1 drop into the left eye 4 (four) times daily.   Yes [provider]  umeclidinium bromide (INCRUSE ELLIPTA) 62.5 MCG/ACT AEPB Inhale 1 puff into the lungs daily. 06/22/23  Yes Storm Frisk, MD  albuterol (VENTOLIN HFA) 108 (90 Base) MCG/ACT inhaler Inhale 2 puffs into the lungs every 4 (four) hours as needed for wheezing or shortness of breath. 04/27/23 09/06/23  Storm Frisk, MD  furosemide (LASIX) 40 MG tablet Take 1 tablet (40 mg total) by mouth daily. Patient not taking: Reported on 12/08/2023 09/01/23 10/01/23  Hoy Register, MD  ipratropium-albuterol (DUONEB) 0.5-2.5 (3) MG/3ML SOLN Take 3 mLs by nebulization every 6 (six) hours as needed. 04/27/23 05/28/23  Storm Frisk, MD  sertraline (ZOLOFT) 50 MG tablet Take 1 tablet (50 mg total) by mouth  daily. 12/08/23   Anders Simmonds, PA-C    ROS: Neg HEENT Neg cardiac Neg GI Neg GU Neg MS Neg psych Neg neuro  Objective:   Vitals:   12/08/23 1439  BP: (!) 133/90  Pulse: 86  SpO2: 97%  Weight: 144 lb 3.2 oz (65.4 kg)   Exam General appearance : Awake, alert, not in any distress. Speech Clear. Not toxic looking HEENT: Atraumatic and Normocephalic Neck: Supple, no JVD. No cervical lymphadenopathy.  Chest: Good air entry bilaterally, CTAB.  No rales/rhonchi/wheezing CVS: S1 S2 regular, no murmurs.   Extremities: B/L Lower Ext shows no edema, both legs are warm to touch Neurology: Awake alert, and oriented X 3, CN II-XII intact, Non focal Skin: No Rash  Data Review Lab Results  Component Value Date   HGBA1C 5.5 09/01/2023   HGBA1C 6.4 (H) 02/21/2020    Assessment & Plan   1. Current moderate episode of major depressive disorder without prior episode (HCC) stable - sertraline (ZOLOFT) 50 MG tablet; Take 1 tablet (50 mg total) by mouth daily.  Dispense: 90 tablet; Refill: 1  2. Elevated blood pressure reading in office without diagnosis of hypertension (Primary) Check BP daily and record and bring to next visit with Dr Alvis Lemmings.  She will determine if lasix can be reduced or diet can be changed  3. On home oxygen therapy On 5L O2.  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  The patient was given clear instructions to go to ER or return to medical center if symptoms don't improve, worsen or new problems develop. The patient verbalized understanding. The patient was told to call to get lab results if they haven't heard anything in the next week.      Georgian Co, PA-C [facility] [place], [place] [phone]   12/08/2023, 3:28 PM

## 2023-12-09 NOTE — Anesthesia Postprocedure Evaluation (Signed)
Anesthesia Post Note  Patient: Niyana E Vert  Procedure(s) Performed: COLONOSCOPY WITH PROPOFOL POLYPECTOMY     Patient location during evaluation: Endoscopy Anesthesia Type: MAC Level of consciousness: awake and alert Pain management: pain level controlled Vital Signs Assessment: post-procedure vital signs reviewed and stable Respiratory status: spontaneous breathing, nonlabored ventilation, respiratory function stable and patient connected to nasal cannula oxygen Cardiovascular status: stable and blood pressure returned to baseline Postop Assessment: no apparent nausea or vomiting Anesthetic complications: no   No notable events documented.  Last Vitals:  Vitals:   12/06/23 1040 12/06/23 1045  BP: 128/76   Pulse: 78 79  Resp: 18 (!) 26  Temp:    SpO2: 97% 96%    Last Pain:  Vitals:   12/06/23 1045  TempSrc:   PainSc: 0-No pain                 Mitsuye Schrodt S

## 2023-12-10 ENCOUNTER — Other Ambulatory Visit: Payer: Self-pay | Admitting: Family Medicine

## 2023-12-10 DIAGNOSIS — Z87891 Personal history of nicotine dependence: Secondary | ICD-10-CM

## 2023-12-10 MED ORDER — NICOTINE POLACRILEX 4 MG MT LOZG: 4.0000 mg | LOZENGE | OROMUCOSAL | Status: DC | PRN

## 2023-12-16 ENCOUNTER — Telehealth: Payer: Self-pay

## 2023-12-16 NOTE — Telephone Encounter (Signed)
Signed physician's orders emailed to [employer]

## 2023-12-20 ENCOUNTER — Telehealth: Payer: Self-pay

## 2023-12-20 NOTE — Telephone Encounter (Signed)
 NOTED

## 2023-12-20 NOTE — Telephone Encounter (Signed)
 Marylene Land informed me referral was declined due to this not being documented in her note. I will address this at her upcoming visit with me and place another referral.

## 2023-12-20 NOTE — Telephone Encounter (Signed)
 Michele Parrish placed the original referral

## 2023-12-20 NOTE — Telephone Encounter (Signed)
 Copied from CRM [id number]. Topic: Referral - Question >> Dec 20, 2023  8:54 AM Maxwell Marion wrote: Reason for CRM: Marylene Land from [facility] called because they received referral for patient for "Lipoma, unspecified site". She said they need to know the site. Referral can either be refaxed or you can call her with site information. Call back number is [phone]

## 2023-12-31 ENCOUNTER — Telehealth: Payer: Self-pay | Admitting: Family Medicine

## 2023-12-31 NOTE — Telephone Encounter (Signed)
 Contacted pt confirm appt!

## 2024-01-01 ENCOUNTER — Other Ambulatory Visit: Payer: Self-pay

## 2024-01-01 ENCOUNTER — Emergency Department (HOSPITAL_COMMUNITY)

## 2024-01-01 ENCOUNTER — Emergency Department (HOSPITAL_COMMUNITY)
Admission: EM | Admit: 2024-01-01 | Discharge: 2024-01-02 | Disposition: A | Discharge status: Skilled Nursing Facility | Attending: Emergency Medicine | Admitting: Emergency Medicine

## 2024-01-01 ENCOUNTER — Encounter (HOSPITAL_COMMUNITY): Payer: Self-pay

## 2024-01-01 DIAGNOSIS — R059 Cough, unspecified: Secondary | ICD-10-CM | POA: Diagnosis present

## 2024-01-01 DIAGNOSIS — I509 Heart failure, unspecified: Secondary | ICD-10-CM | POA: Diagnosis not present

## 2024-01-01 DIAGNOSIS — Z9981 Dependence on supplemental oxygen: Secondary | ICD-10-CM | POA: Insufficient documentation

## 2024-01-01 DIAGNOSIS — J441 Chronic obstructive pulmonary disease with (acute) exacerbation: Secondary | ICD-10-CM | POA: Insufficient documentation

## 2024-01-01 DIAGNOSIS — J101 Influenza due to other identified influenza virus with other respiratory manifestations: Secondary | ICD-10-CM | POA: Insufficient documentation

## 2024-01-01 LAB — CBC WITH DIFFERENTIAL/PLATELET
Abs Immature Granulocytes: 0.06 10*3/uL (ref 0.00–0.07)
Basophils Absolute: 0 10*3/uL (ref 0.0–0.1)
Basophils Relative: 0 %
Eosinophils Absolute: 0 10*3/uL (ref 0.0–0.5)
Eosinophils Relative: 0 %
HCT: 37.5 % (ref 36.0–46.0)
Hemoglobin: 11.5 g/dL — ABNORMAL LOW (ref 12.0–15.0)
Immature Granulocytes: 1 %
Lymphocytes Relative: 11 %
Lymphs Abs: 1 10*3/uL (ref 0.7–4.0)
MCH: 25.9 pg — ABNORMAL LOW (ref 26.0–34.0)
MCHC: 30.7 g/dL (ref 30.0–36.0)
MCV: 84.5 fL (ref 80.0–100.0)
Monocytes Absolute: 1 10*3/uL (ref 0.1–1.0)
Monocytes Relative: 10 %
Neutro Abs: 7.7 10*3/uL (ref 1.7–7.7)
Neutrophils Relative %: 78 %
Platelets: 208 10*3/uL (ref 150–400)
RBC: 4.44 MIL/uL (ref 3.87–5.11)
RDW: 13.8 % (ref 11.5–15.5)
WBC: 9.9 10*3/uL (ref 4.0–10.5)
nRBC: 0 % (ref 0.0–0.2)

## 2024-01-01 LAB — COMPREHENSIVE METABOLIC PANEL
ALT: 15 U/L (ref 0–44)
AST: 24 U/L (ref 15–41)
Albumin: 3.9 g/dL (ref 3.5–5.0)
Alkaline Phosphatase: 71 U/L (ref 38–126)
Anion gap: 11 (ref 5–15)
BUN: 10 mg/dL (ref 6–20)
CO2: 20 mmol/L — ABNORMAL LOW (ref 22–32)
Calcium: 8.7 mg/dL — ABNORMAL LOW (ref 8.9–10.3)
Chloride: 104 mmol/L (ref 98–111)
Creatinine, Ser: 0.9 mg/dL (ref 0.44–1.00)
GFR, Estimated: >60 mL/min (ref >60)
Glucose, Bld: 149 mg/dL — ABNORMAL HIGH (ref 70–99)
Potassium: 3.1 mmol/L — ABNORMAL LOW (ref 3.5–5.1)
Sodium: 135 mmol/L (ref 135–145)
Total Bilirubin: 0.5 mg/dL (ref 0.0–1.2)
Total Protein: 8.1 g/dL (ref 6.5–8.1)

## 2024-01-01 LAB — BRAIN NATRIURETIC PEPTIDE: B Natriuretic Peptide: 126.2 pg/mL — ABNORMAL HIGH (ref 0.0–100.0)

## 2024-01-01 LAB — RESP PANEL BY RT-PCR (RSV, FLU A&B, COVID)  RVPGX2
Influenza A by PCR: POSITIVE — AB
Influenza B by PCR: NEGATIVE
Resp Syncytial Virus by PCR: NEGATIVE
SARS Coronavirus 2 by RT PCR: NEGATIVE

## 2024-01-01 MED ORDER — POTASSIUM CHLORIDE CRYS ER 20 MEQ PO TBCR
40.0000 meq | EXTENDED_RELEASE_TABLET | ORAL | Status: AC
  Administered 2024-01-01: 40 meq via ORAL
  Filled 2024-01-01: qty 2

## 2024-01-01 MED ORDER — PREDNISONE 20 MG PO TABS: 40.0000 mg | ORAL_TABLET | Freq: Every day | ORAL | 0 refills | Status: DC

## 2024-01-01 MED ORDER — IPRATROPIUM-ALBUTEROL 0.5-2.5 (3) MG/3ML IN SOLN
3.0000 mL | Freq: Once | RESPIRATORY_TRACT | Status: AC
  Administered 2024-01-01: 3 mL via RESPIRATORY_TRACT
  Filled 2024-01-01: qty 3

## 2024-01-01 MED ORDER — KETOROLAC TROMETHAMINE 15 MG/ML IJ SOLN
15.0000 mg | Freq: Once | INTRAMUSCULAR | Status: AC
  Administered 2024-01-01: 15 mg via INTRAVENOUS
  Filled 2024-01-01: qty 1

## 2024-01-01 MED ORDER — METHYLPREDNISOLONE SODIUM SUCC 125 MG IJ SOLR
125.0000 mg | INTRAMUSCULAR | Status: AC
  Administered 2024-01-01: 125 mg via INTRAVENOUS
  Filled 2024-01-01: qty 2

## 2024-01-01 MED ORDER — OSELTAMIVIR PHOSPHATE 75 MG PO CAPS: 75.0000 mg | ORAL_CAPSULE | Freq: Two times a day (BID) | ORAL | 0 refills | Status: DC

## 2024-01-01 NOTE — ED Provider Notes (Signed)
 [place] EMERGENCY DEPARTMENT AT [facility] Provider Note   CSN: [id number] Arrival date & time: 01/01/24  1628     History {Add pertinent medical, surgical, social history, OB history to HPI:1} No chief complaint on file.   Michele Parrish is a 58 y.o. female.  58 year old female with history of CHF, pericardial effusion, pulmonary hypertension, and COPD on 5 L home oxygen who presents emergency department with flulike symptoms.  Bursar reports that she started having a nonproductive cough yesterday.  Today developed a headache.  Had nausea and vomiting and diarrhea today as well.  Tried Tylenol for her headache just prior to arrival.  No chest pain.  Also is having subjective fevers.  Also has had some congestion.  No significant improvement with her albuterol inhaler.       Home Medications Prior to Admission medications   Medication Sig Start Date End Date Taking? Authorizing Provider  acetaminophen (TYLENOL) 500 MG tablet Take 500 mg by mouth every 6 (six) hours as needed for mild pain or moderate pain.    [provider]  albuterol (VENTOLIN HFA) 108 (90 Base) MCG/ACT inhaler Inhale 2 puffs into the lungs every 4 (four) hours as needed for wheezing or shortness of breath. 04/27/23 09/06/23  Storm Frisk, MD  budesonide-formoterol (SYMBICORT) 160-4.5 MCG/ACT inhaler Inhale 2 puffs into the lungs 2 (two) times daily. 06/22/23   Storm Frisk, MD  dorzolamide-timolol (COSOPT) 2-0.5 % ophthalmic solution Apply to eye. 03/30/23 03/29/24  [provider]  furosemide (LASIX) 40 MG tablet Take 1 tablet (40 mg total) by mouth daily. Patient not taking: Reported on 12/08/2023 09/01/23 10/01/23  Hoy Register, MD  gabapentin (NEURONTIN) 300 MG capsule Take 1 capsule (300 mg total) by mouth daily. 10/13/23   Hoy Register, MD  HUMIRA-PSORIASIS/UVEIT STARTER 80 MG/0.8ML & 40MG /0.4ML pen starter pack Inject into the skin. 04/13/23   [provider]   ipratropium-albuterol (DUONEB) 0.5-2.5 (3) MG/3ML SOLN Take 3 mLs by nebulization every 6 (six) hours as needed. 04/27/23 05/28/23  Storm Frisk, MD  Misc. Devices MISC Set up oxygen at new location, 5 L of oxygen via nasal cannula.  Diagnosis-chronic hypoxic respiratory failure. 11/22/23   Hoy Register, MD  mycophenolate (CELLCEPT) 500 MG tablet Take 1,500 mg by mouth in the morning and at bedtime.    [provider]  nicotine (NICODERM CQ - DOSED IN MG/24 HOURS) 14 mg/24hr patch Place 1 patch (14 mg total) onto the skin daily. 02/25/23   Burnadette Pop, MD  nicotine polacrilex (SM NICOTINE POLACRILEX) 4 MG lozenge Take 1 lozenge (4 mg total) by mouth every 4 (four) hours as needed for smoking cessation. 12/10/23   Hoy Register, MD  prednisoLONE acetate (PRED FORTE) 1 % ophthalmic suspension Place 1 drop into the left eye 4 (four) times daily.    [provider]  sertraline (ZOLOFT) 50 MG tablet Take 1 tablet (50 mg total) by mouth daily. 12/08/23   Anders Simmonds, PA-C  umeclidinium bromide (INCRUSE ELLIPTA) 62.5 MCG/ACT AEPB Inhale 1 puff into the lungs daily. 06/22/23   Storm Frisk, MD      Allergies    Patient has no known allergies.    Review of Systems   Review of Systems  Physical Exam Updated Vital Signs BP (!) 134/95 (BP Location: Right Arm)   Pulse (!) 108   Temp 99 F (37.2 C) (Oral)   Resp 18   Ht 5\' 2"  (1.575 m)  Wt 65.4 kg   SpO2 100%   BMI 26.37 kg/m  Physical Exam Vitals and nursing note reviewed.  Constitutional:      General: She is not in acute distress.    Appearance: She is well-developed.  HENT:     Head: Normocephalic and atraumatic.     Right Ear: External ear normal.     Left Ear: External ear normal.     Nose: Nose normal.  Eyes:     Extraocular Movements: Extraocular movements intact.     Conjunctiva/sclera: Conjunctivae normal.     Pupils: Pupils are equal, round, and reactive to light.  Cardiovascular:     Rate  and Rhythm: Regular rhythm. Tachycardia present.     Heart sounds: No murmur heard. Pulmonary:     Effort: Pulmonary effort is normal. No respiratory distress.     Breath sounds: Wheezing present.     Comments: Diminished bilaterally.  On 5 L nasal cannula. Musculoskeletal:     Cervical back: Normal range of motion and neck supple.     Right lower leg: No edema.     Left lower leg: No edema.  Skin:    General: Skin is warm and dry.  Neurological:     Mental Status: She is alert and oriented to person, place, and time. Mental status is at baseline.  Psychiatric:        Mood and Affect: Mood normal.     ED Results / Procedures / Treatments   Labs (all labs ordered are listed, but only abnormal results are displayed) Labs Reviewed  RESP PANEL BY RT-PCR (RSV, FLU A&B, COVID)  [id number]  CBC WITH DIFFERENTIAL/PLATELET  COMPREHENSIVE METABOLIC PANEL  BRAIN NATRIURETIC PEPTIDE    EKG None  Radiology No results found.  Procedures Procedures  {Document cardiac monitor, telemetry assessment procedure when appropriate:1}  Medications Ordered in ED Medications  ketorolac (TORADOL) 15 MG/ML injection 15 mg (has no administration in time range)    ED Course/ Medical Decision Making/ A&P Clinical Course as of 01/01/24 1813  Sat Jan 01, 2024  1756 Influenza A By PCR(!): POSITIVE [RP]    Clinical Course User Index [RP] Rondel Baton, MD   {   Click here for ABCD2, HEART and other calculatorsREFRESH Note before signing :1}                              Medical Decision Making Amount and/or Complexity of Data Reviewed Labs: ordered. Decision-making details documented in ED Course. Radiology: ordered.  Risk Prescription drug management.   ***  {Document critical care time when appropriate:1} {Document review of labs and clinical decision tools ie heart score, Chads2Vasc2 etc:1}  {Document your independent review of radiology images, and any outside  records:1} {Document your discussion with family members, caretakers, and with consultants:1} {Document social determinants of health affecting pt's care:1} {Document your decision making why or why not admission, treatments were needed:1} Final Clinical Impression(s) / ED Diagnoses Final diagnoses:  None    Rx / DC Orders ED Discharge Orders     None

## 2024-01-01 NOTE — ED Notes (Signed)
[facility] called for Transport.

## 2024-01-01 NOTE — ED Triage Notes (Addendum)
 Pt bib EMS. Headache started today. Flu like symptoms started yesterday. 650 mg Tylenol 1600. Vomiting and diarrhea earlier today. Pt has a hard time seeing. 93% 5 L and this is her normal. Hx COPD. CBG 160

## 2024-01-01 NOTE — Discharge Instructions (Signed)
 You were seen for the flu and your copd exacerbation in the emergency department.   At home, please use your inhaler as needed for wheezing and shortness of breath.  Take the steroids for your wheezing as well and the Tamiflu for your influenza.    Check your MyChart online for the results of any tests that had not resulted by the time you left the emergency department.   Follow-up with your primary doctor in 2-3 days regarding your visit.    Return immediately to the emergency department if you experience any of the following: Difficulty breathing, or any other concerning symptoms.    Thank you for visiting our Emergency Department. It was a pleasure taking care of you today.

## 2024-01-02 MED ORDER — ACETAMINOPHEN 325 MG PO TABS
650.0000 mg | ORAL_TABLET | Freq: Once | ORAL | Status: AC
  Administered 2024-01-02: 650 mg via ORAL
  Filled 2024-01-02: qty 2

## 2024-01-04 ENCOUNTER — Inpatient Hospital Stay (HOSPITAL_COMMUNITY)
Admission: EM | Admit: 2024-01-04 | Discharge: 2024-01-25 | DRG: 871 | Disposition: E | Source: Skilled Nursing Facility | Attending: Internal Medicine | Admitting: Internal Medicine

## 2024-01-04 ENCOUNTER — Inpatient Hospital Stay (HOSPITAL_COMMUNITY)

## 2024-01-04 ENCOUNTER — Ambulatory Visit: Payer: Medicaid Other | Admitting: Family Medicine

## 2024-01-04 ENCOUNTER — Emergency Department (HOSPITAL_COMMUNITY)

## 2024-01-04 DIAGNOSIS — E874 Mixed disorder of acid-base balance: Secondary | ICD-10-CM | POA: Diagnosis present

## 2024-01-04 DIAGNOSIS — Z87891 Personal history of nicotine dependence: Secondary | ICD-10-CM | POA: Diagnosis not present

## 2024-01-04 DIAGNOSIS — Z66 Do not resuscitate: Secondary | ICD-10-CM | POA: Diagnosis present

## 2024-01-04 DIAGNOSIS — I5032 Chronic diastolic (congestive) heart failure: Secondary | ICD-10-CM | POA: Diagnosis present

## 2024-01-04 DIAGNOSIS — Z79899 Other long term (current) drug therapy: Secondary | ICD-10-CM

## 2024-01-04 DIAGNOSIS — D696 Thrombocytopenia, unspecified: Secondary | ICD-10-CM | POA: Diagnosis present

## 2024-01-04 DIAGNOSIS — J690 Pneumonitis due to inhalation of food and vomit: Secondary | ICD-10-CM | POA: Diagnosis present

## 2024-01-04 DIAGNOSIS — F0390 Unspecified dementia without behavioral disturbance: Secondary | ICD-10-CM | POA: Diagnosis present

## 2024-01-04 DIAGNOSIS — Z79624 Long term (current) use of inhibitors of nucleotide synthesis: Secondary | ICD-10-CM

## 2024-01-04 DIAGNOSIS — R6521 Severe sepsis with septic shock: Secondary | ICD-10-CM | POA: Diagnosis present

## 2024-01-04 DIAGNOSIS — E162 Hypoglycemia, unspecified: Secondary | ICD-10-CM | POA: Diagnosis present

## 2024-01-04 DIAGNOSIS — A419 Sepsis, unspecified organism: Principal | ICD-10-CM | POA: Diagnosis present

## 2024-01-04 DIAGNOSIS — Z515 Encounter for palliative care: Secondary | ICD-10-CM | POA: Diagnosis not present

## 2024-01-04 DIAGNOSIS — L409 Psoriasis, unspecified: Secondary | ICD-10-CM | POA: Diagnosis present

## 2024-01-04 DIAGNOSIS — R001 Bradycardia, unspecified: Secondary | ICD-10-CM | POA: Diagnosis not present

## 2024-01-04 DIAGNOSIS — Z820 Family history of epilepsy and other diseases of the nervous system: Secondary | ICD-10-CM

## 2024-01-04 DIAGNOSIS — H44113 Panuveitis, bilateral: Secondary | ICD-10-CM | POA: Diagnosis present

## 2024-01-04 DIAGNOSIS — N179 Acute kidney failure, unspecified: Secondary | ICD-10-CM

## 2024-01-04 DIAGNOSIS — K7200 Acute and subacute hepatic failure without coma: Secondary | ICD-10-CM | POA: Diagnosis present

## 2024-01-04 DIAGNOSIS — R570 Cardiogenic shock: Secondary | ICD-10-CM | POA: Diagnosis present

## 2024-01-04 DIAGNOSIS — J441 Chronic obstructive pulmonary disease with (acute) exacerbation: Secondary | ICD-10-CM | POA: Diagnosis present

## 2024-01-04 DIAGNOSIS — I469 Cardiac arrest, cause unspecified: Principal | ICD-10-CM | POA: Diagnosis present

## 2024-01-04 DIAGNOSIS — H4010X0 Unspecified open-angle glaucoma, stage unspecified: Secondary | ICD-10-CM | POA: Diagnosis present

## 2024-01-04 DIAGNOSIS — J9622 Acute and chronic respiratory failure with hypercapnia: Secondary | ICD-10-CM | POA: Diagnosis present

## 2024-01-04 DIAGNOSIS — I468 Cardiac arrest due to other underlying condition: Secondary | ICD-10-CM | POA: Diagnosis present

## 2024-01-04 DIAGNOSIS — N170 Acute kidney failure with tubular necrosis: Secondary | ICD-10-CM | POA: Diagnosis present

## 2024-01-04 DIAGNOSIS — H2013 Chronic iridocyclitis, bilateral: Secondary | ICD-10-CM | POA: Diagnosis present

## 2024-01-04 DIAGNOSIS — G931 Anoxic brain damage, not elsewhere classified: Secondary | ICD-10-CM | POA: Diagnosis present

## 2024-01-04 DIAGNOSIS — G9341 Metabolic encephalopathy: Secondary | ICD-10-CM | POA: Diagnosis present

## 2024-01-04 DIAGNOSIS — J9621 Acute and chronic respiratory failure with hypoxia: Secondary | ICD-10-CM | POA: Diagnosis present

## 2024-01-04 DIAGNOSIS — D84821 Immunodeficiency due to drugs: Secondary | ICD-10-CM | POA: Diagnosis present

## 2024-01-04 DIAGNOSIS — R578 Other shock: Secondary | ICD-10-CM | POA: Diagnosis present

## 2024-01-04 DIAGNOSIS — J101 Influenza due to other identified influenza virus with other respiratory manifestations: Secondary | ICD-10-CM | POA: Diagnosis present

## 2024-01-04 DIAGNOSIS — J449 Chronic obstructive pulmonary disease, unspecified: Secondary | ICD-10-CM | POA: Diagnosis not present

## 2024-01-04 DIAGNOSIS — Z1152 Encounter for screening for COVID-19: Secondary | ICD-10-CM | POA: Diagnosis not present

## 2024-01-04 DIAGNOSIS — J09X2 Influenza due to identified novel influenza A virus with other respiratory manifestations: Secondary | ICD-10-CM

## 2024-01-04 DIAGNOSIS — Z7951 Long term (current) use of inhaled steroids: Secondary | ICD-10-CM

## 2024-01-04 DIAGNOSIS — Z9981 Dependence on supplemental oxygen: Secondary | ICD-10-CM

## 2024-01-04 DIAGNOSIS — Z823 Family history of stroke: Secondary | ICD-10-CM

## 2024-01-04 DIAGNOSIS — Z8042 Family history of malignant neoplasm of prostate: Secondary | ICD-10-CM

## 2024-01-04 LAB — CBC WITH DIFFERENTIAL/PLATELET
Abs Immature Granulocytes: 0 10*3/uL (ref 0.00–0.07)
Basophils Absolute: 0 10*3/uL (ref 0.0–0.1)
Basophils Relative: 0 %
Eosinophils Absolute: 0 10*3/uL (ref 0.0–0.5)
Eosinophils Relative: 0 %
HCT: 51.6 % — ABNORMAL HIGH (ref 36.0–46.0)
Hemoglobin: 14.2 g/dL (ref 12.0–15.0)
Lymphocytes Relative: 30 %
Lymphs Abs: 5 10*3/uL — ABNORMAL HIGH (ref 0.7–4.0)
MCH: 25.8 pg — ABNORMAL LOW (ref 26.0–34.0)
MCHC: 27.5 g/dL — ABNORMAL LOW (ref 30.0–36.0)
MCV: 93.6 fL (ref 80.0–100.0)
Monocytes Absolute: 0.8 10*3/uL (ref 0.1–1.0)
Monocytes Relative: 5 %
Neutro Abs: 10.7 10*3/uL — ABNORMAL HIGH (ref 1.7–7.7)
Neutrophils Relative %: 65 %
Platelets: 137 10*3/uL — ABNORMAL LOW (ref 150–400)
RBC: 5.51 MIL/uL — ABNORMAL HIGH (ref 3.87–5.11)
RDW: 14 % (ref 11.5–15.5)
WBC: 16.5 10*3/uL — ABNORMAL HIGH (ref 4.0–10.5)
nRBC: 0 /100{WBCs}
nRBC: 0.4 % — ABNORMAL HIGH (ref 0.0–0.2)

## 2024-01-04 LAB — I-STAT VENOUS BLOOD GAS, ED
Acid-base deficit: 30 mmol/L — ABNORMAL HIGH (ref 0.0–2.0)
Bicarbonate: 7.7 mmol/L — ABNORMAL LOW (ref 20.0–28.0)
Calcium, Ion: 0.99 mmol/L — ABNORMAL LOW (ref 1.15–1.40)
HCT: 49 % — ABNORMAL HIGH (ref 36.0–46.0)
Hemoglobin: 16.7 g/dL — ABNORMAL HIGH (ref 12.0–15.0)
O2 Saturation: 27 %
Potassium: 4.6 mmol/L (ref 3.5–5.1)
Sodium: 139 mmol/L (ref 135–145)
TCO2: 10 mmol/L — ABNORMAL LOW (ref 22–32)
pCO2, Ven: 70.6 mmHg (ref 44–60)
pH, Ven: 6.646 — CL (ref 7.25–7.43)
pO2, Ven: 40 mmHg (ref 32–45)

## 2024-01-04 LAB — COMPREHENSIVE METABOLIC PANEL
ALT: 1041 U/L — ABNORMAL HIGH (ref 0–44)
AST: 1220 U/L — ABNORMAL HIGH (ref 15–41)
Albumin: 3.3 g/dL — ABNORMAL LOW (ref 3.5–5.0)
Alkaline Phosphatase: 65 U/L (ref 38–126)
Anion gap: 27 — ABNORMAL HIGH (ref 5–15)
BUN: 30 mg/dL — ABNORMAL HIGH (ref 6–20)
CO2: 10 mmol/L — ABNORMAL LOW (ref 22–32)
Calcium: 9.2 mg/dL (ref 8.9–10.3)
Chloride: 105 mmol/L (ref 98–111)
Creatinine, Ser: 4.5 mg/dL — ABNORMAL HIGH (ref 0.44–1.00)
GFR, Estimated: 11 mL/min — ABNORMAL LOW (ref >60)
Glucose, Bld: 63 mg/dL — ABNORMAL LOW (ref 70–99)
Potassium: 4.8 mmol/L (ref 3.5–5.1)
Sodium: 142 mmol/L (ref 135–145)
Total Bilirubin: 0.5 mg/dL (ref 0.0–1.2)
Total Protein: 7.5 g/dL (ref 6.5–8.1)

## 2024-01-04 LAB — I-STAT CHEM 8, ED
BUN: 33 mg/dL — ABNORMAL HIGH (ref 6–20)
Calcium, Ion: 0.97 mmol/L — ABNORMAL LOW (ref 1.15–1.40)
Chloride: 114 mmol/L — ABNORMAL HIGH (ref 98–111)
Creatinine, Ser: 4.4 mg/dL — ABNORMAL HIGH (ref 0.44–1.00)
Glucose, Bld: 57 mg/dL — ABNORMAL LOW (ref 70–99)
HCT: 49 % — ABNORMAL HIGH (ref 36.0–46.0)
Hemoglobin: 16.7 g/dL — ABNORMAL HIGH (ref 12.0–15.0)
Potassium: 4.6 mmol/L (ref 3.5–5.1)
Sodium: 138 mmol/L (ref 135–145)
TCO2: 11 mmol/L — ABNORMAL LOW (ref 22–32)

## 2024-01-04 LAB — I-STAT CG4 LACTIC ACID, ED: Lactic Acid, Venous: 14.3 mmol/L (ref 0.5–1.9)

## 2024-01-04 LAB — TROPONIN I (HIGH SENSITIVITY)
Troponin I (High Sensitivity): 154 ng/L (ref <18)
Troponin I (High Sensitivity): 173 ng/L (ref <18)

## 2024-01-04 MED ORDER — POLYETHYLENE GLYCOL 3350 17 G PO PACK: 17.0000 g | PACK | Freq: Every day | ORAL | Status: DC | PRN

## 2024-01-04 MED ORDER — EPINEPHRINE HCL 5 MG/250ML IV SOLN IN NS
0.5000 ug/min | INTRAVENOUS | Status: DC
  Administered 2024-01-04: 5 ug/min via INTRAVENOUS

## 2024-01-04 MED ORDER — CALCIUM CHLORIDE 10 % IV SOLN
INTRAVENOUS | Status: AC | PRN
  Administered 2024-01-04: 1 g via INTRAVENOUS

## 2024-01-04 MED ORDER — NOREPINEPHRINE 4 MG/250ML-% IV SOLN
INTRAVENOUS | Status: AC
  Administered 2024-01-04: 30 ug/min via INTRAVENOUS
  Filled 2024-01-04: qty 250

## 2024-01-04 MED ORDER — FENTANYL CITRATE PF 50 MCG/ML IJ SOSY: 50.0000 ug | PREFILLED_SYRINGE | INTRAMUSCULAR | Status: DC | PRN

## 2024-01-04 MED ORDER — NOREPINEPHRINE 4 MG/250ML-% IV SOLN
0.0000 ug/min | INTRAVENOUS | Status: DC
  Administered 2024-01-04: 40 ug/min via INTRAVENOUS
  Administered 2024-01-04: 37 ug/min via INTRAVENOUS
  Filled 2024-01-04: qty 250

## 2024-01-04 MED ORDER — EPINEPHRINE 1 MG/10ML IJ SOSY
PREFILLED_SYRINGE | INTRAMUSCULAR | Status: AC | PRN
  Administered 2024-01-04: 1 mg via INTRAVENOUS

## 2024-01-04 MED ORDER — EPINEPHRINE 0.1 MG/10ML (10 MCG/ML) SYRINGE FOR IV PUSH (FOR BLOOD PRESSURE SUPPORT): PREFILLED_SYRINGE | INTRAVENOUS | Status: AC | PRN

## 2024-01-04 MED ORDER — NOREPINEPHRINE 4 MG/250ML-% IV SOLN
INTRAVENOUS | Status: AC | PRN
  Administered 2024-01-04: 5 ug/min via INTRAVENOUS

## 2024-01-04 MED ORDER — NOREPINEPHRINE 4 MG/250ML-% IV SOLN: 2.0000 ug/min | INTRAVENOUS | Status: DC

## 2024-01-04 MED ORDER — SODIUM CHLORIDE 0.9 % IV SOLN: 250.0000 mL | INTRAVENOUS | Status: DC

## 2024-01-04 MED ORDER — NOREPINEPHRINE 4 MG/250ML-% IV SOLN
INTRAVENOUS | Status: AC
  Filled 2024-01-04: qty 250

## 2024-01-04 MED ORDER — DOCUSATE SODIUM 50 MG/5ML PO LIQD: 100.0000 mg | Freq: Two times a day (BID) | ORAL | Status: DC

## 2024-01-04 MED ORDER — POLYETHYLENE GLYCOL 3350 17 G PO PACK: 17.0000 g | PACK | Freq: Every day | ORAL | Status: DC

## 2024-01-04 MED ORDER — SODIUM BICARBONATE 8.4 % IV SOLN
INTRAVENOUS | Status: AC | PRN
  Administered 2024-01-04 (×3): 50 meq via INTRAVENOUS

## 2024-01-04 MED ORDER — NOREPINEPHRINE BITARTRATE 1 MG/ML IV SOLN: INTRAVENOUS | Status: AC | PRN

## 2024-01-04 MED ORDER — SODIUM CHLORIDE 0.9 % IV SOLN
INTRAVENOUS | Status: AC | PRN
  Administered 2024-01-04: 999 mL via INTRAVENOUS

## 2024-01-04 MED ORDER — LACTATED RINGERS IV BOLUS
1000.0000 mL | Freq: Once | INTRAVENOUS | Status: AC
  Administered 2024-01-04: 1000 mL via INTRAVENOUS

## 2024-01-04 MED ORDER — SODIUM CHLORIDE 0.9 % IV SOLN
3.0000 g | Freq: Two times a day (BID) | INTRAVENOUS | Status: DC
Start: 2024-01-04 — End: 2024-01-04

## 2024-01-25 NOTE — IPAL (Signed)
  Interdisciplinary Goals of Care Family Meeting   Date carried out: 01/12/2024  Location of the meeting: Phone conference  Member's involved: Nurse Practitioner, Family Member or next of kin, and Other: The family is estranged, only contact person Tracey Harries Power of Attorney or Environmental health practitioner: I called Ms. Baird via phone.    Discussion: We discussed goals of care for Michele Parrish .  We discussed the prolonged cardiac arrest, current clinical finding and high degree of certainty of anoxic brain injury she is in circulatory shock with refractory vasoplegia and multiorgan failure I have nothing else to offer her in light of her prolonged downtime this is not survivable.  She will be DO NOT RESUSCITATE.  Obtaining CT of head, adding as needed analgesia.  No further escalation.  I suspect she will arrest on full support  Code status:   Code Status: Do not attempt resuscitation (DNR) - Comfort care   Disposition: In-patient comfort care  Time spent for the meeting: 23    Shelby Mattocks, NP  01/24/2024, 11:01 AM

## 2024-01-25 NOTE — Progress Notes (Signed)
 Pharmacy Antibiotic Note  Michele Parrish is a 58 y.o. female admitted on 01/15/2024 and now being treated for pneumonia.  Pharmacy has been consulted for Unasyn dosing. Patient is post-cardiac arrest from facility. She is afebrile. Most recent WBC 16.5 and lactate of 14.3. She is currently requiring vasopressors for hemodynamic support. Her Scr is elevated at 4.50.  Plan: Start renally adjusted Unasyn Continue to monitor pressor requirements, WBC, and fever curve. Patient may need to start broad spectrum abx Monitor Scr for renal adjustment of Unasyn    Temp (24hrs), Avg:95.4 F (35.2 C), Min:95.4 F (35.2 C), Max:95.4 F (35.2 C)  Recent Labs  Lab 01/01/24 1727 12/28/2023 0910 01/11/2024 0937  WBC 9.9  --  16.5*  CREATININE 0.90 4.40* 4.50*  LATICACIDVEN  --  14.3*  --     Estimated Creatinine Clearance: 12.5 mL/min (A) (by C-G formula based on SCr of 4.5 mg/dL (H)).    No Known Allergies  Antimicrobials this admission: Unasyn 3/11 >>   Dose adjustments this admission: Unasyn 3 gm q12h  Microbiology results:   Thank you for allowing pharmacy to be a part of this patient's care.  Wilmer Floor 01/23/2024 11:13 AM

## 2024-01-25 NOTE — Code Documentation (Signed)
BG 58

## 2024-01-25 NOTE — H&P (Signed)
 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  Was seen on 3/8 with complaints of flulike symptoms chest x-ray was negative for infiltrate she had faint wheezing pulse oximetry in the mid 90s diagnostic evaluation was consistent with influenza she was discharged back to facility on Tamiflu  On 3/11 and unresponsive and in cardiac arrest.  Unknown downtime prior to initiation of CPR.  On EMS arrival found to be in asystole.  Provided several rounds of CPR estimated at 50 minutes prior to return of spontaneous circulation receiving 8 doses of epinephrine in the field.  Subsequently had another brief cardiac arrest while in the emergency room shortly after arrival this responded to 1 round of epinephrine and CPR.  The patient has no next of kin, estranged daughter.  Resides at skilled nursing facility.  Following return of spontaneous circulation was started on both epinephrine and norepinephrine infusions her GCS is 3 she is hypoxic with pulse oximetry in the low 80s on 100% and PEEP titration up to 12 systolic blood pressure in the 100s no gag, no cough, no corneals.  Critical care asked to admit Pertinent  Medical History  COPD, chronic respiratory failure on 5 L nasal cannula at baseline, pericardial effusion, HFpEF with grade 1 diastolic dysfunction, open angle glaucoma, cataracts, and uveitis both eyes she is on chronic immunosuppression for this.  Resides at skilled nursing facility  Significant Hospital Events: Including procedures, antibiotic start and stop dates in  addition to other pertinent events   3/11 admitted status post cardiac arrest with prolonged downtime exceeding 15 minutes.  He GCS 3, unreactive pupils, no corneals, no cough or gag requiring high-dose vasoactive drip for circulatory support critical care asked to admit  Interim History / Subjective:  Unresponsive Objective   Blood pressure (!) 90/55, pulse 80, temperature (!) 95.4 F (35.2 C), temperature source Temporal, resp. rate 20, SpO2 92%.    Vent Mode: PRVC FiO2 (%):  [100 %] 100 % Set Rate:  [26 bmp] 26 bmp Vt Set:  [500 mL] 500 mL PEEP:  [8 cmH20] 8 cmH20 Plateau Pressure:  [28 cmH20] 28 cmH20  No intake or output data in the 24 hours ending 01/05/2024 1021 There were no vitals filed for this visit.  Examination: General: Terminally ill appearing 58 year old female currently unresponsive on mechanical ventilator HENT: Orally intubated no obvious JVD has bilateral crowding of both eyes pupils round and not reactive no corneals on my exam Lungs:  scattered bilateral rhonchi Plateau pressure 28 portable chest x-ray personally reviewed shows endotracheal tube in satisfactory position evolving right greater than left airspace disease she does have bloody secretions in the tracheal tube Cardiovascular: Regular rate and rhythm Abdomen: Soft not tender Extremities: Cool Neuro: Unresponsive GCS 3 GU: Anuric  Resolved Hospital Problem list     Assessment & Plan:  Status post asystolic cardiac arrest Acute on chronic respiratory failure with history of COPD, acutely exacerbated by cardiopulmonary arrest and aspiration pneumonia Acute metabolic encephalopathy with high level of concern for anoxic brain injury Circulatory shock status postcardiac arrest Severe lactic acidosis Acute kidney injury Hypoglycemia Influenza A  infection Leukocytosis Thrombocytopenia History of heart failure with preserved EF 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  Suspect mixed picture of postarrest vasoplegia, also consider component of sepsis given known influenza and what looks to be aspiration event Plan Already maxed on high dose norepinephrine and epinephrine I think addition of additional hemodynamic support would be futile, no further titration Continue telemetry Holding any antihypertensives Will add Unasyn for aspiration Stress dose steroids Follow-up lactic acid Admit to the intensive care. Contact her friend listed as family contact and let them know nothing else to offer I do not think he will survive past the day  Acute on chronic hypoxic respiratory failure status postcardiac arrest.  Secondary to aspiration pneumonia superimposed on influenza A and underlying COPD Plan Continuing full ventilator support, have increased PEEP already on 100% FiO2 Not a candidate for prone position Not a candidate for neuromuscular blockade Follow-up arterial blood gas VAP bundle Scheduled bronchodilators Antibiotics as above  Acute metabolic encephalopathy with high degree of concern for severe anoxic brain injury Downtime greater than 50 minutes, and this does not account for unknown downtime prior to initiation of CPR.  GCS is 3 Plan Treating fever Trying to maintain MAP with current interventions CT of head Keep euglycemic She will be DO NOT RESUSCITATE should she have another cardiac arrest  AKI status postcardiac arrest Plan Continuing maintenance IV fluids Continuing norepinephrine and epinephrine infusion as above but no further titration up She is not a candidate for dialysis given her prolonged cardiac arrest, and almost certainly anoxic brain injury Follow-up chemistry  History of heart failure with preserved EF Plan Holding any antihypertensives  Hypoglycemia Plan Adding dextrose to fluids, also suspect stress dose steroids would help Continue hourly  Accu-Cheks  Thrombocytopenia Plan Supportive care Follow-up CBC  History of chronic immunosuppression being followed by optometry for panuveitis of both eyes has an extensive ocular history Plan Continuing any eyedrops Holding immunosuppression Best Practice (right click and "Reselect all SmartList Selections" daily)   Diet/type: NPO DVT prophylaxis SCD Pressure ulcer(s): N/A GI prophylaxis: H2B Lines: N/A Foley:  Yes, and it is still needed Code Status:  DNR Last date of multidisciplinary goals of care discussion [pending]  Labs   CBC: Recent Labs  Lab 01/01/24 1727 12/30/2023 0910 12/31/2023 0937  WBC 9.9  --  16.5*  NEUTROABS 7.7  --  PENDING  HGB 11.5* 16.7*  16.7* 14.2  HCT 37.5 49.0*  49.0* 51.6*  MCV 84.5  --  93.6  PLT 208  --  137*    Basic Metabolic Panel: Recent Labs  Lab 01/01/24 1727 01/01/2024 0910  NA 135 139  138  K 3.1* 4.6  4.6  CL 104 114*  CO2 20*  --   GLUCOSE 149* 57*  BUN 10 33*  CREATININE 0.90 4.40*  CALCIUM 8.7*  --    GFR: Estimated Creatinine Clearance: 12.8 mL/min (A) (by C-G formula based on SCr of 4.4 mg/dL (H)). Recent Labs  Lab 01/01/24 1727 01/01/2024 0910 01/07/2024 0937  WBC 9.9  --  16.5*  LATICACIDVEN  --  14.3*  --     Liver Function Tests: Recent Labs  Lab 01/01/24 1727  AST 24  ALT 15  ALKPHOS 71  BILITOT 0.5  PROT 8.1  ALBUMIN 3.9   No results for input(s): "LIPASE", "AMYLASE" in the last 168 hours. No results for input(s): "AMMONIA" in the last 168 hours.  ABG  Component Value Date/Time   PHART 7.418 06/28/2020 1512   PCO2ART 37.3 06/28/2020 1512   PO2ART 73.9 (L) 06/28/2020 1512   HCO3 7.7 (L) 01/06/2024 0910   TCO2 11 (L) 01/23/2024 0910   TCO2 10 (L) 12/30/2023 0910   ACIDBASEDEF 30.0 (H) 01/03/2024 0910   O2SAT 27 12/25/2023 0910     Coagulation Profile: No results for input(s): "INR", "PROTIME" in the last 168 hours.  Cardiac Enzymes: No results for input(s): "CKTOTAL", "CKMB",  "CKMBINDEX", "TROPONINI" in the last 168 hours.  HbA1C: Hemoglobin A1C  Date/Time Value Ref Range Status  09/01/2023 11:15 AM 5.5 4.0 - 5.6 % Final   Hgb A1c MFr Bld  Date/Time Value Ref Range Status  02/21/2020 01:16 PM 6.4 (H) 4.8 - 5.6 % Final    Comment:    (NOTE) Pre diabetes:          5.7%-6.4% Diabetes:              >6.4% Glycemic control for   <7.0% adults with diabetes     CBG: No results for input(s): "GLUCAP" in the last 168 hours.  Review of Systems:   Not able   Past Medical History:  She,  has a past medical history of Acute exacerbation of CHF (congestive heart failure) (HCC) (02/22/2020), Bronchitis, Bronchopneumonia (09/07/2021), Pericardial effusion, Pulmonary hypertension (HCC), and Suicidal ideation (06/15/2022).   Surgical History:   Past Surgical History:  Procedure Laterality Date   ABDOMINAL HYSTERECTOMY     COLONOSCOPY WITH PROPOFOL N/A 12/06/2023   Procedure: COLONOSCOPY WITH PROPOFOL;  Surgeon: Jenel Lucks, MD;  Location: [facility] ENDOSCOPY;  Service: Gastroenterology;  Laterality: N/A;   EYE SURGERY     POLYPECTOMY  12/06/2023   Procedure: POLYPECTOMY;  Surgeon: Jenel Lucks, MD;  Location: [facility];  Service: Gastroenterology;;   RIGHT HEART CATH N/A 02/23/2020   Procedure: RIGHT HEART CATH;  Surgeon: Iran Ouch, MD;  Location: [facility] INVASIVE CV LAB;  Service: Cardiovascular;  Laterality: N/A;     Social History:   reports that she quit smoking about 3 years ago. Her smoking use included cigarettes. She started smoking about 45 years ago. She has a 63 pack-year smoking history. She has never used smokeless tobacco. She reports current alcohol use. She reports that she does not use drugs.   Family History:  Her family history includes Multiple sclerosis in her mother; Prostate cancer in her father; Stroke in her father.   Allergies No Known Allergies   Home Medications  Prior to Admission medications   Medication Sig  Start Date End Date Taking? Authorizing Provider  acetaminophen (TYLENOL) 500 MG tablet Take 500 mg by mouth every 6 (six) hours as needed for mild pain or moderate pain.    [provider]  albuterol (VENTOLIN HFA) 108 (90 Base) MCG/ACT inhaler Inhale 2 puffs into the lungs every 4 (four) hours as needed for wheezing or shortness of breath. 04/27/23 09/06/23  Storm Frisk, MD  budesonide-formoterol (SYMBICORT) 160-4.5 MCG/ACT inhaler Inhale 2 puffs into the lungs 2 (two) times daily. 06/22/23   Storm Frisk, MD  dorzolamide-timolol (COSOPT) 2-0.5 % ophthalmic solution Apply to eye. 03/30/23 03/29/24  [provider]  furosemide (LASIX) 40 MG tablet Take 1 tablet (40 mg total) by mouth daily. Patient not taking: Reported on 12/08/2023 09/01/23 10/01/23  Hoy Register, MD  gabapentin (NEURONTIN) 300 MG capsule Take 1 capsule (300 mg total) by mouth daily. 10/13/23   Hoy Register, MD  HUMIRA-PSORIASIS/UVEIT STARTER  80 MG/0.8ML & 40MG /0.4ML pen starter pack Inject into the skin. 04/13/23   [provider]  ipratropium-albuterol (DUONEB) 0.5-2.5 (3) MG/3ML SOLN Take 3 mLs by nebulization every 6 (six) hours as needed. 04/27/23 05/28/23  Storm Frisk, MD  Misc. Devices MISC Set up oxygen at new location, 5 L of oxygen via nasal cannula.  Diagnosis-chronic hypoxic respiratory failure. 11/22/23   Hoy Register, MD  mycophenolate (CELLCEPT) 500 MG tablet Take 1,500 mg by mouth in the morning and at bedtime.    [provider]  nicotine (NICODERM CQ - DOSED IN MG/24 HOURS) 14 mg/24hr patch Place 1 patch (14 mg total) onto the skin daily. 02/25/23   Burnadette Pop, MD  nicotine polacrilex (SM NICOTINE POLACRILEX) 4 MG lozenge Take 1 lozenge (4 mg total) by mouth every 4 (four) hours as needed for smoking cessation. 12/10/23   Hoy Register, MD  oseltamivir (TAMIFLU) 75 MG capsule Take 1 capsule (75 mg total) by mouth every 12 (twelve) hours. 01/01/24   Rondel Baton,  MD  prednisoLONE acetate (PRED FORTE) 1 % ophthalmic suspension Place 1 drop into the left eye 4 (four) times daily.    [provider]  predniSONE (DELTASONE) 20 MG tablet Take 2 tablets (40 mg total) by mouth daily for 5 days. 01/01/24 01/06/24  Rondel Baton, MD  sertraline (ZOLOFT) 50 MG tablet Take 1 tablet (50 mg total) by mouth daily. 12/08/23   Anders Simmonds, PA-C  umeclidinium bromide (INCRUSE ELLIPTA) 62.5 MCG/ACT AEPB Inhale 1 puff into the lungs daily. 06/22/23   Storm Frisk, MD     Critical care time: 35 min

## 2024-01-25 NOTE — Progress Notes (Addendum)
 Informed of CT head results by Dr Margo Aye This confirmed clinical suspicion Plan Cont rx as outlined Will need to contact donor services but seems like would not be great candidate.  No escalation  Will reach out to her contact. I think we should transition to wd of care, certainly any additional rx is futile   Simonne Martinet ACNP-BC [facility] Pulmonary/Critical Care Pager # [phone] OR # [phone] if no answer

## 2024-01-25 NOTE — Progress Notes (Signed)
 Per CCM order, RT extubated pt. Pt had expired while on ventilator.

## 2024-01-25 NOTE — ED Triage Notes (Addendum)
 Pt to ED from facility. LKW 0700. Chest pain x 2 days. Found around 0800 with no pulses & asystole. CPR initiated. 15 min CPR pulses- +pulses. EMS lost pulses again. 8 epi given in field. PEA en route with HR 45-50. Pt in room at 0856 with Samuel Bouche running. +Flu -- on Tamiflu.

## 2024-01-25 NOTE — ED Provider Notes (Signed)
 [place] EMERGENCY DEPARTMENT AT [facility] Provider Note   CSN: [id number] Arrival date & time: 12/29/2023  9147     History  Chief Complaint  Patient presents with   Cardiac Arrest    Michele Parrish is a 58 y.o. female.  58 year old female with prior medical history as detailed below presents from her facility for evaluation.  Patient was found unresponsive.  Unknown downtime.  She was last seen normal around 7 AM.  Patient was found unresponsive with no pulses.  EMS found her to be in asystole on their arrival.  CPR initiated in the field.  Approximately 50 minutes of CPR performed prior to arrival.  8 epi is given in the field.  Patient was diagnosed with flu approximately 3 days ago.  Patient is full code per EMS report.  The history is provided by medical records and the EMS personnel.       Home Medications Prior to Admission medications   Medication Sig Start Date End Date Taking? Authorizing Provider  acetaminophen (TYLENOL) 500 MG tablet Take 500 mg by mouth every 6 (six) hours as needed for mild pain or moderate pain.    [provider]  albuterol (VENTOLIN HFA) 108 (90 Base) MCG/ACT inhaler Inhale 2 puffs into the lungs every 4 (four) hours as needed for wheezing or shortness of breath. 04/27/23 09/06/23  Storm Frisk, MD  budesonide-formoterol (SYMBICORT) 160-4.5 MCG/ACT inhaler Inhale 2 puffs into the lungs 2 (two) times daily. 06/22/23   Storm Frisk, MD  dorzolamide-timolol (COSOPT) 2-0.5 % ophthalmic solution Apply to eye. 03/30/23 03/29/24  [provider]  furosemide (LASIX) 40 MG tablet Take 1 tablet (40 mg total) by mouth daily. Patient not taking: Reported on 12/08/2023 09/01/23 10/01/23  Hoy Register, MD  gabapentin (NEURONTIN) 300 MG capsule Take 1 capsule (300 mg total) by mouth daily. 10/13/23   Hoy Register, MD  HUMIRA-PSORIASIS/UVEIT STARTER 80 MG/0.8ML & 40MG /0.4ML pen starter pack Inject into the skin. 04/13/23    [provider]  ipratropium-albuterol (DUONEB) 0.5-2.5 (3) MG/3ML SOLN Take 3 mLs by nebulization every 6 (six) hours as needed. 04/27/23 05/28/23  Storm Frisk, MD  Misc. Devices MISC Set up oxygen at new location, 5 L of oxygen via nasal cannula.  Diagnosis-chronic hypoxic respiratory failure. 11/22/23   Hoy Register, MD  mycophenolate (CELLCEPT) 500 MG tablet Take 1,500 mg by mouth in the morning and at bedtime.    [provider]  nicotine (NICODERM CQ - DOSED IN MG/24 HOURS) 14 mg/24hr patch Place 1 patch (14 mg total) onto the skin daily. 02/25/23   Burnadette Pop, MD  nicotine polacrilex (SM NICOTINE POLACRILEX) 4 MG lozenge Take 1 lozenge (4 mg total) by mouth every 4 (four) hours as needed for smoking cessation. 12/10/23   Hoy Register, MD  oseltamivir (TAMIFLU) 75 MG capsule Take 1 capsule (75 mg total) by mouth every 12 (twelve) hours. 01/01/24   Rondel Baton, MD  prednisoLONE acetate (PRED FORTE) 1 % ophthalmic suspension Place 1 drop into the left eye 4 (four) times daily.    [provider]  predniSONE (DELTASONE) 20 MG tablet Take 2 tablets (40 mg total) by mouth daily for 5 days. 01/01/24 01/06/24  Rondel Baton, MD  sertraline (ZOLOFT) 50 MG tablet Take 1 tablet (50 mg total) by mouth daily. 12/08/23   Anders Simmonds, PA-C  umeclidinium bromide (INCRUSE ELLIPTA) 62.5 MCG/ACT AEPB Inhale 1 puff into the lungs daily. 06/22/23  Storm Frisk, MD      Allergies    Patient has no known allergies.    Review of Systems   Review of Systems  Unable to perform ROS: Acuity of condition    Physical Exam Updated Vital Signs BP (!) 90/55   Pulse 80   Temp (!) 95.4 F (35.2 C) (Temporal)   Resp 20   SpO2 92%  Physical Exam Vitals and nursing note reviewed.  Constitutional:      General: She is not in acute distress.    Appearance: She is well-developed.     Comments: Unresponsive, cardiac arrest, CPR in progress  HENT:     Head:  Normocephalic and atraumatic.  Cardiovascular:     Rate and Rhythm: Normal rate and regular rhythm.     Heart sounds: Normal heart sounds.  Pulmonary:     Effort: No respiratory distress.     Comments: Assisted ventilations ongoing, bloody secretions from oropharynx Abdominal:     General: There is no distension.     Palpations: Abdomen is soft.     Tenderness: There is no abdominal tenderness.  Musculoskeletal:        General: No deformity. Normal range of motion.  Skin:    General: Skin is warm and dry.  Neurological:     General: No focal deficit present.     Mental Status: She is oriented to person, place, and time.     ED Results / Procedures / Treatments   Labs (all labs ordered are listed, but only abnormal results are displayed) Labs Reviewed  I-STAT CG4 LACTIC ACID, ED - Abnormal; Notable for the following components:      Result Value   Lactic Acid, Venous 14.3 (*)    All other components within normal limits  I-STAT CHEM 8, ED - Abnormal; Notable for the following components:   Chloride 114 (*)    BUN 33 (*)    Creatinine, Ser 4.40 (*)    Glucose, Bld 57 (*)    Calcium, Ion 0.97 (*)    TCO2 11 (*)    Hemoglobin 16.7 (*)    HCT 49.0 (*)    All other components within normal limits  I-STAT VENOUS BLOOD GAS, ED - Abnormal; Notable for the following components:   pH, Ven 6.646 (*)    pCO2, Ven 70.6 (*)    Bicarbonate 7.7 (*)    TCO2 10 (*)    Acid-base deficit 30.0 (*)    Calcium, Ion 0.99 (*)    HCT 49.0 (*)    Hemoglobin 16.7 (*)    All other components within normal limits  COMPREHENSIVE METABOLIC PANEL  CBC WITH DIFFERENTIAL/PLATELET  I-STAT ARTERIAL BLOOD GAS, ED  TROPONIN I (HIGH SENSITIVITY)    EKG EKG Interpretation Date/Time:  Tuesday January 04 2024 09:05:20 EDT Ventricular Rate:  96 PR Interval:  175 QRS Duration:  141 QT Interval:  354 QTC Calculation: 448 R Axis:   -20  Text Interpretation: Sinus rhythm Right atrial enlargement IVCD,  consider atypical LBBB Baseline wander in lead(s) V2 V3 V4 V5 Confirmed by Kristine Royal [phone]) on 01/10/2024 9:37:03 AM  Radiology No results found.  Procedures Procedures    Medications Ordered in ED Medications  norepinephrine (LEVOPHED) 4mg  in (0.016 mg/mL) premix infusion (40 mcg/min Intravenous Rate/Dose Change 01/19/2024 0934)  0.9 %  sodium chloride infusion (999 mLs Intravenous New Bag/Given 01/13/2024 0901)  calcium chloride injection (1 g Intravenous Given 01/03/2024 0907)  EPINEPHrine (ADRENALIN) 1 MG/10ML  injection (5 mg Intravenous Canceled Entry 01/15/2024 0932)  sodium bicarbonate injection (50 mEq Intravenous Given 01/16/2024 0932)  EPINEPHrine (ADRENALIN) 1 MG/10ML injection (1 mg Intravenous Given 01/24/2024 0919)  norepinephrine (LEVOPHED) injection (20 mcg Intravenous Canceled Entry 01/08/2024 0922)  EPINEPhrine 10 mcg/mL Adult IV Push Syringe (For Blood Pressure Support) (5 mcg Intravenous Canceled Entry 12/25/2023 0932)  EPINEPHrine (ADRENALIN) 5 mg in NS 250 mL (0.02 mg/mL) premix infusion (10 mcg/min Intravenous Rate/Dose Change 12/30/2023 0935)    ED Course/ Medical Decision Making/ A&P                                 Medical Decision Making Amount and/or Complexity of Data Reviewed Labs: ordered. Radiology: ordered.  Risk Prescription drug management. Decision regarding hospitalization.    Medical Screen Complete  This patient presented to the ED with complaint of cardiac arrest.  This complaint involves an extensive number of treatment options. The initial differential diagnosis includes, but is not limited to, cardiopulmonary arrest  This presentation is: Acute, Self-Limited, Previously Undiagnosed, Uncertain Prognosis, Complicated, Systemic Symptoms, and Threat to Life/Bodily Function  Patient with multiple comorbidities with recent diagnosis of influenza presents in cardiac arrest.  Patient with unknown downtime.  Patient was initially in asystole per EMS.  50  minutes of CPR performed prior to arrival.  Patient was given 8 doses of epinephrine in the field.  On arrival the patient continues to be in PEA arrest with CPR in progress.  Initial pulse check after 1 dose of epi in the ED revealed ROSC.  Bicarb, Levophed, IV fluids administered.  Patient had another brief period of loss of pulses with return after repeat epi administration.  Epi drip initiated.  ET tube placed by myself.  Initial ET tube had a cuff leak (possibly snagged on patient's dentition).  This was replaced without difficulty.  Patient's blood pressure is now 90 systolic.  Heart rate is in the 70s.  Initial labs are dismal with an elevated lactic acid of greater than 14.  Patient with acute kidney injury with a creatinine of 4.4.  Case briefly discussed with patient's listed contact - Ms. Dario Ave is the patient's friend.  Patient does not have family that is involved with her care.  She is estranged from her daughter.  She does not have a reported POA per Ms. Colette Ribas.  Per Ms. Byrd, patient with full CODE STATUS.  This is reflected in the medical chart as well.  Case discussed briefly with critical care.  They will evaluate for likely admission.  Given highly unlikely possibility of recovery I feel that the patient would not benefit from additional CPR.  Prolonged downtime, prolonged CPR, significantly elevated lactic acid are strongly suggestive of dismal outcome if she survives the next several hours.  Co morbidities that complicated the patient's evaluation  See HPI   Additional history obtained:  External records from outside sources obtained and reviewed including prior ED visits and prior Inpatient records.    Lab Tests:  I ordered and personally interpreted labs.  The pertinent results include: I-STAT Chem-8, i-STAT lactic acid, i-STAT VBG, CBC, CMP, troponin, ABG   Imaging Studies ordered:  I ordered imaging studies including chest x-ray  I agree with the  radiologist interpretation.   Cardiac Monitoring:  The patient was maintained on a cardiac monitor.  I personally viewed and interpreted the cardiac monitor which showed an underlying rhythm of: PEA then NSR  Problem List / ED Course:  Cardiac arrest   Reevaluation:  After the interventions noted above, I reevaluated the patient and found that they have: improved   Disposition:  After consideration of the diagnostic results and the patients response to treatment, I feel that the patent would benefit from admission.   CRITICAL CARE Performed by: Wynetta Fines   Total critical care time: 45 minutes  Critical care time was exclusive of separately billable procedures and treating other patients.  Critical care was necessary to treat or prevent imminent or life-threatening deterioration.  Critical care was time spent personally by me on the following activities: development of treatment plan with patient and/or surrogate as well as nursing, discussions with consultants, evaluation of patient's response to treatment, examination of patient, obtaining history from patient or surrogate, ordering and performing treatments and interventions, ordering and review of laboratory studies, ordering and review of radiographic studies, pulse oximetry and re-evaluation of patient's condition.          Final Clinical Impression(s) / ED Diagnoses Final diagnoses:  Cardiac arrest [facility])    Rx / DC Orders ED Discharge Orders     None         Wynetta Fines, MD 12/31/2023 1010

## 2024-01-25 NOTE — Progress Notes (Signed)
 She has become progressively hypotensive and bradycardic over the course of her hospitalization refractory to vasoactive drips Plan Given the extensive anoxic brain injury will discontinue norepinephrine and epinephrine Discontinue antibiotics Transition to full comfort I anticipate she will pass away on mechanical ventilator

## 2024-01-25 NOTE — Progress Notes (Signed)
 Asystole on telemetry Per ounce by nursing staff Will remove endotracheal tube I notified her emergency contact

## 2024-01-25 NOTE — Progress Notes (Signed)
 Pt transported on ventilator from ED trauma B to CT then to 3M11 without any complications.

## 2024-01-25 NOTE — Death Summary Note (Signed)
 DEATH SUMMARY   Patient Details  Name: Michele Parrish MRN: [id number] DOB: 1966-04-13  Admission/Discharge Information   Admit Date:  01/05/2024  Date of Death: Date of Death: 05-Jan-2024  Time of Death: Time of Death: 1547  Length of Stay: 1  Referring Physician: Hoy Register, MD   Reason(s) for Hospitalization  Status post PEA cardiac arrest Acute on chronic respiratory failure with hypoxia and hypercapnia Acute COPD exacerbation Aspiration pneumonia Acute encephalopathy likely anoxic and metabolic Mixed metabolic and respiratory acidosis Dementia anemia post arrest Shock, mixed cardiogenic and septic Lactic acidosis Acute kidney injury ischemic ATN Shock liver Influenza A infection Recurrent hypoglycemia Thrombocytopenia 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 year old female patient with complex medical history including chronic respiratory failure on 5 L nasal cannula due to COPD, heart failure with preserved EF, grade 1 diastolic dysfunction, open-angle glaucoma, cataracts, uveitis of both eyes on chronic immunosuppression.  Was seen on 3/8 with complaints of flulike symptoms chest x-ray was negative for infiltrate she had faint wheezing pulse oximetry in the mid 90s diagnostic evaluation was consistent with influenza she was discharged back to facility on Tamiflu   On 05-Jan-2024 and unresponsive and in cardiac arrest.  Unknown downtime prior to initiation of CPR.  On EMS arrival found to be in asystole.  Provided several rounds of CPR estimated at 50 minutes  prior to return of spontaneous circulation receiving 8 doses of epinephrine in the field.  Subsequently had another brief cardiac arrest while in the emergency room shortly after arrival this responded to 1 round of epinephrine and CPR.  The patient has no next of kin, estranged daughter.  Resides at skilled nursing facility.  Following return of spontaneous circulation was started on both epinephrine and norepinephrine infusions her GCS is 3 she is hypoxic with pulse oximetry in the low 80s on 100% and PEEP titration up to 12 systolic blood pressure in the 100s no gag, no cough, no corneals.    Patient was admitted to ICU, CT head was done which showed diffuse severe anoxic brain injury.  Goals of care discussions were carried patient DPOA, they decided to proceed with comfort care, patient was placed on comfort care and she passed on 01-05-24 at 3:47 PM.  Patient's DPOA was informed  Pertinent Labs and Studies  Significant Diagnostic Studies CT HEAD WO CONTRAST ( ) Addendum Date: 2024/01/05 ADDENDUM REPORT: 01/05/24 13:13 ADDENDUM: Study discussed by telephone with Dr. Zenia Resides on Jan 05, 2024 at 1303 hours. Electronically Signed   By: Odessa Fleming M.D.   On: 01-05-24 13:13   Result Date: 01/05/24 CLINICAL DATA:  58 year old female found down. "Anoxic brain injury". EXAM: CT HEAD WITHOUT CONTRAST TECHNIQUE: Contiguous axial images were obtained from the base of the skull through the vertex without intravenous contrast. RADIATION DOSE REDUCTION: This exam was performed according to the departmental dose-optimization program which includes automated exposure control, adjustment of the mA and/or kV according to patient size and/or use of iterative reconstruction technique. COMPARISON:  Head CT from 02/22/2022. FINDINGS: Brain: Diffuse severe cerebral edema. Near complete effacement of all ventricles and sulci. Conspicuous, pronounced symmetric cytotoxic edema in  the caudate nuclei and to a lesser  extent lentiform. Pseudo subarachnoid hemorrhage appearance as bilateral basilar cisterns are also subtotally effaced. No midline shift or tonsillar herniation at this time. Vascular: Noncontributory. Skull: Intact. Sinuses/Orbits: Intubated. Fluid levels in the paranasal sinuses. Tympanic cavities and mastoids better aerated. Other: Postoperative changes to the globes. No acute orbit or scalp soft tissue finding identified. Partially visible endotracheal and oral enteric tubes. IMPRESSION: Severe diffuse Anoxic Brain Injury. No areas appear spared. No herniation as of yet. Electronically Signed: By: Odessa Fleming M.D. On: 01/20/2024 13:01   DG Chest Portable 1 View Result Date: 12/27/2023 CLINICAL DATA:  Chest pain.  COVID blue.  CPR initiated. EXAM: PORTABLE CHEST 1 VIEW COMPARISON:  Chest radiograph dated 01/01/2024. FINDINGS: Evaluation is very limited due to overlying support apparatus. Endotracheal tube with tip approximately 5 cm above the carina. Enteric tube extends below the diaphragm with tip beyond the inferior margin of the image. Background of emphysema and chronic interstitial coarsening. Increased density over the right mid to lower lung field may represent atelectasis, developing infiltrate, or aspiration. No pleural effusion or pneumothorax. The cardiac silhouette is within normal limits. No acute osseous pathology. IMPRESSION: 1. Endotracheal tube with tip approximately 5 cm above the carina. 2. Right mid to lower lung field atelectasis, developing infiltrate, or aspiration. Electronically Signed   By: Elgie Collard M.D.   On: 12/31/2023 13:04   DG Chest 2 View Result Date: 01/01/2024 CLINICAL DATA:  Cough and flu-like symptoms beginning yesterday. EXAM: CHEST - 2 VIEW COMPARISON:  02/21/2023 FINDINGS: The heart size and mediastinal contours are within normal limits. Previously seen mild right lung airspace disease is no longer visualized. Bilateral lower lung scarring is again seen. No evidence  of acute infiltrate or edema. No pleural effusion. The visualized skeletal structures are unremarkable. IMPRESSION: Bilateral lower lung scarring.  No active cardiopulmonary disease. Electronically Signed   By: Danae Orleans M.D.   On: 01/01/2024 17:35    Microbiology Recent Results (from the past 240 hours)  Resp panel by RT-PCR (RSV, Flu A&B, Covid) Anterior Nasal Swab     Status: Abnormal   Collection Time: 01/01/24  4:43 PM   Specimen: Anterior Nasal Swab  Result Value Ref Range Status   SARS Coronavirus 2 by RT PCR NEGATIVE NEGATIVE Final    Comment: (NOTE) SARS-CoV-2 target nucleic acids are NOT DETECTED.  The SARS-CoV-2 RNA is generally detectable in upper respiratory specimens during the acute phase of infection. The lowest concentration of SARS-CoV-2 viral copies this assay can detect is 138 copies/mL. A negative result does not preclude SARS-Cov-2 infection and should not be used as the sole basis for treatment or other patient management decisions. A negative result may occur with  improper specimen collection/handling, submission of specimen other than nasopharyngeal swab, presence of viral mutation(s) within the areas targeted by this assay, and inadequate number of viral copies(<138 copies/mL). A negative result must be combined with clinical observations, patient history, and epidemiological information. The expected result is Negative.  Fact Sheet for Patients:  BloggerCourse.com  Fact Sheet for Healthcare Providers:  SeriousBroker.it  This test is no t yet approved or cleared by the [place] FDA and  has been authorized for detection and/or diagnosis of SARS-CoV-2 by FDA under an Emergency Use Authorization (EUA). This EUA will remain  in effect (meaning this test can be used) for the duration of the COVID-19 declaration under Section 564(b)(1) of the Act, 21 U.S.C.section 360bbb-3(b)(1), unless the  authorization  is terminated  or revoked sooner.       Influenza A by PCR POSITIVE (A) NEGATIVE Final   Influenza B by PCR NEGATIVE NEGATIVE Final    Comment: (NOTE) The Xpert Xpress SARS-CoV-2/FLU/RSV plus assay is intended as an aid in the diagnosis of influenza from Nasopharyngeal swab specimens and should not be used as a sole basis for treatment. Nasal washings and aspirates are unacceptable for Xpert Xpress SARS-CoV-2/FLU/RSV testing.  Fact Sheet for Patients: BloggerCourse.com  Fact Sheet for Healthcare Providers: SeriousBroker.it  This test is not yet approved or cleared by the [place] FDA and has been authorized for detection and/or diagnosis of SARS-CoV-2 by FDA under an Emergency Use Authorization (EUA). This EUA will remain in effect (meaning this test can be used) for the duration of the COVID-19 declaration under Section 564(b)(1) of the Act, 21 U.S.C. section 360bbb-3(b)(1), unless the authorization is terminated or revoked.     Resp Syncytial Virus by PCR NEGATIVE NEGATIVE Final    Comment: (NOTE) Fact Sheet for Patients: BloggerCourse.com  Fact Sheet for Healthcare Providers: SeriousBroker.it  This test is not yet approved or cleared by the [place] FDA and has been authorized for detection and/or diagnosis of SARS-CoV-2 by FDA under an Emergency Use Authorization (EUA). This EUA will remain in effect (meaning this test can be used) for the duration of the COVID-19 declaration under Section 564(b)(1) of the Act, 21 U.S.C. section 360bbb-3(b)(1), unless the authorization is terminated or revoked.  Performed at [facility], [address], [place], [place] [address]     Lab Basic Metabolic Panel: Recent Labs  Lab 01/01/24 1727 01/02/2024 0910 12/31/2023 0937  NA 135 139  138 142  K 3.1* 4.6  4.6 4.8  CL 104 114* 105   CO2 20*  --  10*  GLUCOSE 149* 57* 63*  BUN 10 33* 30*  CREATININE 0.90 4.40* 4.50*  CALCIUM 8.7*  --  9.2   Liver Function Tests: Recent Labs  Lab 01/01/24 1727 12/25/2023 0937  AST 24 1,220*  ALT 15 1,041*  ALKPHOS 71 65  BILITOT 0.5 0.5  PROT 8.1 7.5  ALBUMIN 3.9 3.3*   No results for input(s): "LIPASE", "AMYLASE" in the last 168 hours. No results for input(s): "AMMONIA" in the last 168 hours. CBC: Recent Labs  Lab 01/01/24 1727 12/25/2023 0910 01/15/2024 0937  WBC 9.9  --  16.5*  NEUTROABS 7.7  --  10.7*  HGB 11.5* 16.7*  16.7* 14.2  HCT 37.5 49.0*  49.0* 51.6*  MCV 84.5  --  93.6  PLT 208  --  137*   Cardiac Enzymes: No results for input(s): "CKTOTAL", "CKMB", "CKMBINDEX", "TROPONINI" in the last 168 hours. Sepsis Labs: Recent Labs  Lab 01/01/24 1727 12/28/2023 0910 12/27/2023 0937  WBC 9.9  --  16.5*  LATICACIDVEN  --  14.3*  --     Procedures/Operations     [employer] 01/05/2024, 9:27 AM

## 2024-01-25 DEATH — deceased

## 2024-02-23 ENCOUNTER — Other Ambulatory Visit (HOSPITAL_COMMUNITY): Payer: Self-pay

## 2024-09-08 ENCOUNTER — Other Ambulatory Visit (HOSPITAL_COMMUNITY): Payer: Self-pay
